# Patient Record
Sex: Female | Born: 1945
Health system: Southern US, Community
[De-identification: ages and names within clinical notes are randomized; demographics above are authoritative.]

## PROBLEM LIST (undated history)

## (undated) DIAGNOSIS — R609 Edema, unspecified: Secondary | ICD-10-CM

## (undated) DIAGNOSIS — M1990 Unspecified osteoarthritis, unspecified site: Secondary | ICD-10-CM

## (undated) DIAGNOSIS — E7800 Pure hypercholesterolemia, unspecified: Secondary | ICD-10-CM

## (undated) DIAGNOSIS — R202 Paresthesia of skin: Secondary | ICD-10-CM

## (undated) DIAGNOSIS — I10 Essential (primary) hypertension: Secondary | ICD-10-CM

## (undated) DIAGNOSIS — J449 Chronic obstructive pulmonary disease, unspecified: Secondary | ICD-10-CM

## (undated) DIAGNOSIS — M419 Scoliosis, unspecified: Secondary | ICD-10-CM

## (undated) DIAGNOSIS — D649 Anemia, unspecified: Secondary | ICD-10-CM

## (undated) DIAGNOSIS — G629 Polyneuropathy, unspecified: Secondary | ICD-10-CM

## (undated) DIAGNOSIS — T4145XA Adverse effect of unspecified anesthetic, initial encounter: Secondary | ICD-10-CM

## (undated) DIAGNOSIS — F419 Anxiety disorder, unspecified: Secondary | ICD-10-CM

## (undated) DIAGNOSIS — Z8719 Personal history of other diseases of the digestive system: Secondary | ICD-10-CM

## (undated) DIAGNOSIS — T8859XA Other complications of anesthesia, initial encounter: Secondary | ICD-10-CM

## (undated) DIAGNOSIS — F5104 Psychophysiologic insomnia: Secondary | ICD-10-CM

## (undated) DIAGNOSIS — R0602 Shortness of breath: Secondary | ICD-10-CM

## (undated) DIAGNOSIS — E539 Vitamin B deficiency, unspecified: Secondary | ICD-10-CM

## (undated) DIAGNOSIS — H9190 Unspecified hearing loss, unspecified ear: Secondary | ICD-10-CM

## (undated) DIAGNOSIS — F329 Major depressive disorder, single episode, unspecified: Secondary | ICD-10-CM

## (undated) DIAGNOSIS — R002 Palpitations: Secondary | ICD-10-CM

## (undated) DIAGNOSIS — K219 Gastro-esophageal reflux disease without esophagitis: Secondary | ICD-10-CM

## (undated) DIAGNOSIS — F32A Depression, unspecified: Secondary | ICD-10-CM

## (undated) DIAGNOSIS — R011 Cardiac murmur, unspecified: Secondary | ICD-10-CM

## (undated) DIAGNOSIS — N6310 Unspecified lump in the right breast, unspecified quadrant: Secondary | ICD-10-CM

## (undated) DIAGNOSIS — E210 Primary hyperparathyroidism: Secondary | ICD-10-CM

## (undated) DIAGNOSIS — Z8711 Personal history of peptic ulcer disease: Secondary | ICD-10-CM

## (undated) DIAGNOSIS — M810 Age-related osteoporosis without current pathological fracture: Secondary | ICD-10-CM

## (undated) DIAGNOSIS — I499 Cardiac arrhythmia, unspecified: Secondary | ICD-10-CM

## (undated) HISTORY — PX: VAGINAL HYSTERECTOMY: SUR661

## (undated) HISTORY — DX: Psychophysiologic insomnia: F51.04

## (undated) HISTORY — DX: Age-related osteoporosis without current pathological fracture: M81.0

## (undated) HISTORY — PX: DILATION AND CURETTAGE OF UTERUS: SHX78

## (undated) HISTORY — DX: Cardiac murmur, unspecified: R01.1

## (undated) HISTORY — PX: OTHER SURGICAL HISTORY: SHX169

## (undated) HISTORY — PX: JOINT REPLACEMENT: SHX530

## (undated) HISTORY — PX: BREAST SURGERY: SHX581

## (undated) HISTORY — DX: Unspecified osteoarthritis, unspecified site: M19.90

## (undated) HISTORY — PX: REFRACTIVE SURGERY: SHX103

## (undated) HISTORY — DX: Pure hypercholesterolemia, unspecified: E78.00

## (undated) HISTORY — DX: Chronic obstructive pulmonary disease, unspecified: J44.9

## (undated) HISTORY — PX: PITUITARY SURGERY: SHX203

## (undated) HISTORY — PX: EYE SURGERY: SHX253

---

## 1990-02-09 HISTORY — PX: BREAST BIOPSY: SHX20

## 1990-02-09 HISTORY — PX: BREAST EXCISIONAL BIOPSY: SUR124

## 2003-02-10 HISTORY — PX: LAPAROSCOPY: SHX197

## 2003-05-09 ENCOUNTER — Other Ambulatory Visit: Payer: Self-pay

## 2003-10-09 ENCOUNTER — Other Ambulatory Visit: Payer: Self-pay

## 2004-02-26 ENCOUNTER — Ambulatory Visit: Payer: Self-pay | Admitting: Internal Medicine

## 2004-08-19 ENCOUNTER — Ambulatory Visit: Payer: Self-pay | Admitting: Internal Medicine

## 2004-09-03 ENCOUNTER — Ambulatory Visit: Payer: Self-pay | Admitting: Internal Medicine

## 2004-09-29 ENCOUNTER — Ambulatory Visit: Payer: Self-pay | Admitting: Internal Medicine

## 2005-09-07 ENCOUNTER — Ambulatory Visit: Payer: Self-pay | Admitting: Obstetrics and Gynecology

## 2006-09-23 ENCOUNTER — Ambulatory Visit: Payer: Self-pay | Admitting: Obstetrics and Gynecology

## 2007-07-27 ENCOUNTER — Emergency Department: Payer: Self-pay | Admitting: Emergency Medicine

## 2007-10-26 ENCOUNTER — Ambulatory Visit: Payer: Self-pay | Admitting: Obstetrics and Gynecology

## 2008-10-05 ENCOUNTER — Ambulatory Visit: Payer: Self-pay | Admitting: Obstetrics and Gynecology

## 2008-10-12 ENCOUNTER — Ambulatory Visit: Payer: Self-pay | Admitting: Obstetrics and Gynecology

## 2009-02-18 ENCOUNTER — Ambulatory Visit: Payer: Self-pay | Admitting: Internal Medicine

## 2009-06-17 ENCOUNTER — Ambulatory Visit: Payer: Self-pay | Admitting: Otolaryngology

## 2009-06-26 ENCOUNTER — Ambulatory Visit: Payer: Self-pay | Admitting: Otolaryngology

## 2009-10-23 ENCOUNTER — Ambulatory Visit: Payer: Self-pay | Admitting: Otolaryngology

## 2009-11-07 ENCOUNTER — Encounter: Admission: RE | Admit: 2009-11-07 | Discharge: 2009-11-07 | Payer: Self-pay | Admitting: Neurological Surgery

## 2010-01-06 ENCOUNTER — Inpatient Hospital Stay (HOSPITAL_COMMUNITY): Admission: RE | Admit: 2010-01-06 | Discharge: 2010-01-09 | Payer: Self-pay | Admitting: Neurosurgery

## 2010-01-06 ENCOUNTER — Encounter (INDEPENDENT_AMBULATORY_CARE_PROVIDER_SITE_OTHER): Payer: Self-pay | Admitting: Neurosurgery

## 2010-04-18 ENCOUNTER — Ambulatory Visit: Payer: Self-pay | Admitting: Internal Medicine

## 2010-04-22 LAB — TYPE AND SCREEN
ABO/RH(D): O POS
Antibody Screen: NEGATIVE

## 2010-04-22 LAB — GLUCOSE, CAPILLARY
Glucose-Capillary: 109 mg/dL — ABNORMAL HIGH (ref 70–99)
Glucose-Capillary: 109 mg/dL — ABNORMAL HIGH (ref 70–99)
Glucose-Capillary: 118 mg/dL — ABNORMAL HIGH (ref 70–99)
Glucose-Capillary: 135 mg/dL — ABNORMAL HIGH (ref 70–99)
Glucose-Capillary: 142 mg/dL — ABNORMAL HIGH (ref 70–99)
Glucose-Capillary: 154 mg/dL — ABNORMAL HIGH (ref 70–99)
Glucose-Capillary: 179 mg/dL — ABNORMAL HIGH (ref 70–99)
Glucose-Capillary: 194 mg/dL — ABNORMAL HIGH (ref 70–99)

## 2010-04-22 LAB — COMPREHENSIVE METABOLIC PANEL
Albumin: 4 g/dL (ref 3.5–5.2)
Alkaline Phosphatase: 78 U/L (ref 39–117)
BUN: 16 mg/dL (ref 6–23)
Chloride: 108 mEq/L (ref 96–112)
Creatinine, Ser: 0.79 mg/dL (ref 0.4–1.2)
GFR calc non Af Amer: >60 mL/min (ref >60)
Glucose, Bld: 97 mg/dL (ref 70–99)
Potassium: 4.3 mEq/L (ref 3.5–5.1)
Total Bilirubin: 1.1 mg/dL (ref 0.3–1.2)

## 2010-04-22 LAB — CBC
HCT: 43.3 % (ref 36.0–46.0)
MCH: 31.9 pg (ref 26.0–34.0)
MCV: 93.9 fL (ref 78.0–100.0)
Platelets: 332 10*3/uL (ref 150–400)
RBC: 4.61 MIL/uL (ref 3.87–5.11)

## 2010-04-22 LAB — SODIUM
Sodium: 139 mEq/L (ref 135–145)
Sodium: 137 mEq/L (ref 135–145)
Sodium: 138 mEq/L (ref 135–145)
Sodium: 139 mEq/L (ref 135–145)
Sodium: 139 mEq/L (ref 135–145)

## 2010-04-22 LAB — OSMOLALITY
Osmolality: 285 mOsm/kg (ref 275–300)
Osmolality: 288 mOsm/kg (ref 275–300)
Osmolality: 290 mOsm/kg (ref 275–300)

## 2010-04-22 LAB — SURGICAL PCR SCREEN
MRSA, PCR: NEGATIVE
Staphylococcus aureus: NEGATIVE

## 2010-04-22 LAB — TISSUE CULTURE: Culture: NO GROWTH

## 2010-05-22 ENCOUNTER — Ambulatory Visit: Payer: Self-pay | Admitting: Neurosurgery

## 2010-10-22 ENCOUNTER — Other Ambulatory Visit: Payer: Self-pay | Admitting: Neurosurgery

## 2010-10-22 DIAGNOSIS — D352 Benign neoplasm of pituitary gland: Secondary | ICD-10-CM

## 2010-12-02 ENCOUNTER — Ambulatory Visit
Admission: RE | Admit: 2010-12-02 | Discharge: 2010-12-02 | Disposition: A | Discharge status: Home / Self Care | Payer: Federal, State, Local not specified - PPO | Source: Ambulatory Visit | Attending: Neurosurgery | Admitting: Neurosurgery

## 2010-12-02 DIAGNOSIS — D352 Benign neoplasm of pituitary gland: Secondary | ICD-10-CM

## 2010-12-02 MED ORDER — GADOBENATE DIMEGLUMINE 529 MG/ML IV SOLN
10.0000 mL | Freq: Once | INTRAVENOUS | Status: AC | PRN
  Administered 2010-12-02: 10 mL via INTRAVENOUS

## 2011-02-20 ENCOUNTER — Inpatient Hospital Stay: Payer: Self-pay | Admitting: General Practice

## 2011-02-20 LAB — URINALYSIS, COMPLETE
Bilirubin,UR: NEGATIVE
Glucose,UR: NEGATIVE mg/dL (ref 0–75)
Leukocyte Esterase: NEGATIVE
Nitrite: NEGATIVE
Ph: 7 (ref 4.5–8.0)
Protein: NEGATIVE
RBC,UR: NONE SEEN /HPF (ref 0–5)
Specific Gravity: 1.006 (ref 1.003–1.030)

## 2011-02-20 LAB — PROTIME-INR
INR: 0.9
Prothrombin Time: 13 secs (ref 11.5–14.7)

## 2011-02-20 LAB — BASIC METABOLIC PANEL
Anion Gap: 12 (ref 7–16)
BUN: 16 mg/dL (ref 7–18)
EGFR (African American): >60
EGFR (Non-African Amer.): >60
Glucose: 134 mg/dL — ABNORMAL HIGH (ref 65–99)
Potassium: 3.7 mmol/L (ref 3.5–5.1)
Sodium: 142 mmol/L (ref 136–145)

## 2011-02-20 LAB — CBC
HGB: 13.9 g/dL (ref 12.0–16.0)
MCHC: 34 g/dL (ref 32.0–36.0)
Platelet: 259 10*3/uL (ref 150–440)
RBC: 4.24 10*6/uL (ref 3.80–5.20)
WBC: 13.4 10*3/uL — ABNORMAL HIGH (ref 3.6–11.0)

## 2011-02-21 LAB — PLATELET COUNT: Platelet: 210 10*3/uL (ref 150–440)

## 2011-02-21 LAB — HEMOGLOBIN: HGB: 10.9 g/dL — ABNORMAL LOW (ref 12.0–16.0)

## 2011-02-21 LAB — BASIC METABOLIC PANEL
Anion Gap: 10 (ref 7–16)
BUN: 8 mg/dL (ref 7–18)
Calcium, Total: 8.5 mg/dL (ref 8.5–10.1)
Co2: 23 mmol/L (ref 21–32)
Creatinine: 0.66 mg/dL (ref 0.60–1.30)
Osmolality: 284 (ref 275–301)

## 2011-02-22 LAB — MAGNESIUM: Magnesium: 1.9 mg/dL

## 2011-02-22 LAB — BASIC METABOLIC PANEL
Anion Gap: 9 (ref 7–16)
Calcium, Total: 8.2 mg/dL — ABNORMAL LOW (ref 8.5–10.1)
Co2: 25 mmol/L (ref 21–32)
EGFR (African American): >60
EGFR (Non-African Amer.): >60
Glucose: 101 mg/dL — ABNORMAL HIGH (ref 65–99)
Osmolality: 290 (ref 275–301)
Potassium: 3.2 mmol/L — ABNORMAL LOW (ref 3.5–5.1)
Sodium: 146 mmol/L — ABNORMAL HIGH (ref 136–145)

## 2011-02-22 LAB — HEMOGLOBIN: HGB: 9.8 g/dL — ABNORMAL LOW (ref 12.0–16.0)

## 2011-02-22 LAB — PLATELET COUNT: Platelet: 179 10*3/uL (ref 150–440)

## 2011-02-23 LAB — BASIC METABOLIC PANEL
BUN: 6 mg/dL — ABNORMAL LOW (ref 7–18)
Calcium, Total: 8.3 mg/dL — ABNORMAL LOW (ref 8.5–10.1)
Chloride: 111 mmol/L — ABNORMAL HIGH (ref 98–107)
Co2: 23 mmol/L (ref 21–32)
Osmolality: 287 (ref 275–301)
Potassium: 4 mmol/L (ref 3.5–5.1)

## 2011-02-23 LAB — CBC WITH DIFFERENTIAL/PLATELET
Basophil %: 0.6 %
Eosinophil %: 3.7 %
HGB: 9.9 g/dL — ABNORMAL LOW (ref 12.0–16.0)
Lymphocyte %: 16.6 %
MCH: 31.8 pg (ref 26.0–34.0)
Monocyte #: 1.4 10*3/uL — ABNORMAL HIGH (ref 0.0–0.7)
Monocyte %: 14 %
Neutrophil %: 65.1 %
RBC: 3.1 10*6/uL — ABNORMAL LOW (ref 3.80–5.20)
WBC: 9.8 10*3/uL (ref 3.6–11.0)

## 2011-02-24 LAB — BASIC METABOLIC PANEL
Anion Gap: 12 (ref 7–16)
BUN: 8 mg/dL (ref 7–18)
Chloride: 110 mmol/L — ABNORMAL HIGH (ref 98–107)
Co2: 24 mmol/L (ref 21–32)
Creatinine: 0.6 mg/dL (ref 0.60–1.30)
EGFR (African American): >60
EGFR (Non-African Amer.): >60
Sodium: 146 mmol/L — ABNORMAL HIGH (ref 136–145)

## 2011-04-15 ENCOUNTER — Other Ambulatory Visit: Payer: Self-pay | Admitting: Neurosurgery

## 2011-04-15 DIAGNOSIS — D497 Neoplasm of unspecified behavior of endocrine glands and other parts of nervous system: Secondary | ICD-10-CM

## 2011-04-20 ENCOUNTER — Ambulatory Visit
Admission: RE | Admit: 2011-04-20 | Discharge: 2011-04-20 | Disposition: A | Discharge status: Home / Self Care | Payer: Federal, State, Local not specified - PPO | Source: Ambulatory Visit | Attending: Neurosurgery | Admitting: Neurosurgery

## 2011-04-20 DIAGNOSIS — D497 Neoplasm of unspecified behavior of endocrine glands and other parts of nervous system: Secondary | ICD-10-CM

## 2011-04-20 MED ORDER — GADOBENATE DIMEGLUMINE 529 MG/ML IV SOLN
10.0000 mL | Freq: Once | INTRAVENOUS | Status: AC | PRN
  Administered 2011-04-20: 10 mL via INTRAVENOUS

## 2011-05-26 DIAGNOSIS — J449 Chronic obstructive pulmonary disease, unspecified: Secondary | ICD-10-CM | POA: Insufficient documentation

## 2011-06-16 ENCOUNTER — Other Ambulatory Visit: Payer: Self-pay | Admitting: Neurosurgery

## 2011-06-16 DIAGNOSIS — M542 Cervicalgia: Secondary | ICD-10-CM

## 2011-06-23 ENCOUNTER — Ambulatory Visit
Admission: RE | Admit: 2011-06-23 | Discharge: 2011-06-23 | Disposition: A | Discharge status: Home / Self Care | Payer: Federal, State, Local not specified - PPO | Source: Ambulatory Visit | Attending: Neurosurgery | Admitting: Neurosurgery

## 2011-06-23 DIAGNOSIS — M542 Cervicalgia: Secondary | ICD-10-CM

## 2011-06-23 MED ORDER — GADOBENATE DIMEGLUMINE 529 MG/ML IV SOLN
15.0000 mL | Freq: Once | INTRAVENOUS | Status: AC | PRN
  Administered 2011-06-23: 15 mL via INTRAVENOUS

## 2011-07-28 ENCOUNTER — Ambulatory Visit: Payer: Self-pay | Admitting: Family Medicine

## 2011-10-14 ENCOUNTER — Other Ambulatory Visit: Payer: Self-pay | Admitting: Neurosurgery

## 2011-10-16 ENCOUNTER — Encounter (HOSPITAL_COMMUNITY): Payer: Self-pay | Admitting: Pharmacy Technician

## 2011-10-26 ENCOUNTER — Encounter (HOSPITAL_COMMUNITY)
Admission: RE | Admit: 2011-10-26 | Discharge: 2011-10-26 | Disposition: A | Discharge status: Home / Self Care | Payer: Federal, State, Local not specified - PPO | Source: Ambulatory Visit | Attending: Neurosurgery | Admitting: Neurosurgery

## 2011-10-26 ENCOUNTER — Encounter (HOSPITAL_COMMUNITY): Payer: Self-pay

## 2011-10-26 ENCOUNTER — Ambulatory Visit (HOSPITAL_COMMUNITY)
Admission: RE | Admit: 2011-10-26 | Discharge: 2011-10-26 | Disposition: A | Discharge status: Home / Self Care | Payer: Federal, State, Local not specified - PPO | Source: Ambulatory Visit | Attending: Neurosurgery | Admitting: Neurosurgery

## 2011-10-26 DIAGNOSIS — Z01812 Encounter for preprocedural laboratory examination: Secondary | ICD-10-CM | POA: Insufficient documentation

## 2011-10-26 DIAGNOSIS — Z01810 Encounter for preprocedural cardiovascular examination: Secondary | ICD-10-CM | POA: Insufficient documentation

## 2011-10-26 DIAGNOSIS — Z01818 Encounter for other preprocedural examination: Secondary | ICD-10-CM | POA: Insufficient documentation

## 2011-10-26 HISTORY — DX: Gastro-esophageal reflux disease without esophagitis: K21.9

## 2011-10-26 HISTORY — DX: Major depressive disorder, single episode, unspecified: F32.9

## 2011-10-26 HISTORY — DX: Depression, unspecified: F32.A

## 2011-10-26 HISTORY — DX: Essential (primary) hypertension: I10

## 2011-10-26 HISTORY — DX: Anxiety disorder, unspecified: F41.9

## 2011-10-26 HISTORY — DX: Cardiac arrhythmia, unspecified: I49.9

## 2011-10-26 LAB — CBC
HCT: 39 % (ref 36.0–46.0)
Hemoglobin: 13.1 g/dL (ref 12.0–15.0)
MCV: 91.1 fL (ref 78.0–100.0)
RBC: 4.28 MIL/uL (ref 3.87–5.11)
RDW: 14.1 % (ref 11.5–15.5)
WBC: 11.1 10*3/uL — ABNORMAL HIGH (ref 4.0–10.5)

## 2011-10-26 LAB — BASIC METABOLIC PANEL
CO2: 26 mEq/L (ref 19–32)
Chloride: 106 mEq/L (ref 96–112)
Creatinine, Ser: 0.83 mg/dL (ref 0.50–1.10)
GFR calc Af Amer: 85 mL/min — ABNORMAL LOW (ref >90)
Potassium: 3.9 mEq/L (ref 3.5–5.1)
Sodium: 140 mEq/L (ref 135–145)

## 2011-10-26 LAB — SURGICAL PCR SCREEN: Staphylococcus aureus: POSITIVE — AB

## 2011-10-26 NOTE — Pre-Procedure Instructions (Signed)
20 Shamela Haydon Page  10/26/2011   Your procedure is scheduled on:   10/30/11  Friday    Report to [facility] Short Stay Center at 1230 PM.  Call this number if you have problems the morning of surgery: [phone]   Remember:   Do not eat food OR DRINK :After Midnight.   Take these medicines the morning of surgery with A SIP OF WATER:  NORVASC(AMLODIPINE), BUSPAR,  LOPRESSOR(METOPROLOL), PROTONIX, ZOLOFT, TOPAMAX, ULTRAM IF NEEDED   Do not wear jewelry, make-up or nail polish.  Do not wear lotions, powders, or perfumes. You may wear deodorant.  Do not shave 48 hours prior to surgery. Men may shave face and neck.  Do not bring valuables to the hospital.  Contacts, dentures or bridgework may not be worn into surgery.  Leave suitcase in the car. After surgery it may be brought to your room.  For patients admitted to the hospital, checkout time is 11:00 AM the day of discharge.   Patients discharged the day of surgery will not be allowed to drive home.  Name and phone number of your driver:   Special Instructions: CHG Shower Use Special Wash: 1/2 bottle night before surgery and 1/2 bottle morning of surgery.   Please read over the following fact sheets that you were given: Pain Booklet, Coughing and Deep Breathing, MRSA Information and Surgical Site Infection Prevention

## 2011-10-26 NOTE — Progress Notes (Signed)
REQUESTED LAST OFFICE NOTE, EKG, STRESS TEST FROM [facility] IN [place].   PATIENT DOES NOT HAVE CARDIOLOGIST.

## 2011-10-27 MED ORDER — CEFAZOLIN SODIUM-DEXTROSE 2-3 GM-% IV SOLR
2.0000 g | INTRAVENOUS | Status: AC
  Administered 2011-10-28: 2 g via INTRAVENOUS

## 2011-10-27 NOTE — Consult Note (Addendum)
Anesthesia chart review: Patient is a 66 year old female scheduled for C4-5, C5-6 ACDF on 10/30/11 by Dr. Wynetta Emery.  History includes former smoker, HTN, anxiety, depression, palpitations, GERD, "partial" right hip replacement '13, prior pituitary macroadenoma s/p resection '11.  PCP is Dr. Judithann Sheen.  EKG on 10/26/11 showed SB with occasional PACs.  Her PAT documented that patient had a prior stress test done at [facility].  Records requested, but are still pending.  There is no active cardiopulmonary disease on chest x-ray from 10/26/2011.  Labs noted.    I'll follow-up additional records from [facility] once available.  Shonna Chock, PA-C 10/27/11 1822  Addendum: 10/27/11 1850 I was just notified that this case was moved to 10/28/11.  As above, previous stress test is still pending.  There is no documentation of when this test was done.  Patient has no cardiac history other than HTN and palpitations.  Short Stay staff will have to follow-up on cardiac testing results tomorrow morning (if done within the past three years).

## 2011-10-27 NOTE — Progress Notes (Signed)
Pt called.  Message left w/ husband, Fredrik Cove, that time had been changed to 8:00AM.  He verbalized understanding.

## 2011-10-28 ENCOUNTER — Encounter (HOSPITAL_COMMUNITY)
Admission: RE | Disposition: A | Discharge status: Home / Self Care | Payer: Self-pay | Source: Ambulatory Visit | Attending: Neurosurgery

## 2011-10-28 ENCOUNTER — Ambulatory Visit (HOSPITAL_COMMUNITY): Payer: Federal, State, Local not specified - PPO

## 2011-10-28 ENCOUNTER — Encounter (HOSPITAL_COMMUNITY): Payer: Self-pay | Admitting: *Deleted

## 2011-10-28 ENCOUNTER — Encounter (HOSPITAL_COMMUNITY): Payer: Self-pay | Admitting: Vascular Surgery

## 2011-10-28 ENCOUNTER — Ambulatory Visit (HOSPITAL_COMMUNITY): Payer: Federal, State, Local not specified - PPO | Admitting: Vascular Surgery

## 2011-10-28 ENCOUNTER — Ambulatory Visit (HOSPITAL_COMMUNITY)
Admission: RE | Admit: 2011-10-28 | Discharge: 2011-10-29 | Disposition: A | Discharge status: Home / Self Care | Payer: Federal, State, Local not specified - PPO | Source: Ambulatory Visit | Attending: Neurosurgery | Admitting: Neurosurgery

## 2011-10-28 DIAGNOSIS — M47812 Spondylosis without myelopathy or radiculopathy, cervical region: Secondary | ICD-10-CM | POA: Insufficient documentation

## 2011-10-28 DIAGNOSIS — I10 Essential (primary) hypertension: Secondary | ICD-10-CM | POA: Insufficient documentation

## 2011-10-28 DIAGNOSIS — K219 Gastro-esophageal reflux disease without esophagitis: Secondary | ICD-10-CM | POA: Insufficient documentation

## 2011-10-28 DIAGNOSIS — F3289 Other specified depressive episodes: Secondary | ICD-10-CM | POA: Insufficient documentation

## 2011-10-28 DIAGNOSIS — F329 Major depressive disorder, single episode, unspecified: Secondary | ICD-10-CM | POA: Insufficient documentation

## 2011-10-28 DIAGNOSIS — F411 Generalized anxiety disorder: Secondary | ICD-10-CM | POA: Insufficient documentation

## 2011-10-28 HISTORY — PX: ANTERIOR CERVICAL DECOMP/DISCECTOMY FUSION: SHX1161

## 2011-10-28 LAB — GLUCOSE, CAPILLARY: Glucose-Capillary: 178 mg/dL — ABNORMAL HIGH (ref 70–99)

## 2011-10-28 SURGERY — ANTERIOR CERVICAL DECOMPRESSION/DISCECTOMY FUSION 2 LEVELS
Anesthesia: General | Site: Spine Cervical | Wound class: Clean

## 2011-10-28 MED ORDER — IRBESARTAN 75 MG PO TABS
75.0000 mg | ORAL_TABLET | Freq: Every day | ORAL | Status: DC
  Administered 2011-10-29: 75 mg via ORAL
  Filled 2011-10-28 (×2): qty 1

## 2011-10-28 MED ORDER — ADULT MULTIVITAMIN W/MINERALS CH
1.0000 | ORAL_TABLET | Freq: Every day | ORAL | Status: DC
  Administered 2011-10-28 – 2011-10-29 (×2): 1 via ORAL
  Filled 2011-10-28 (×2): qty 1

## 2011-10-28 MED ORDER — ASPIRIN EC 81 MG PO TBEC
81.0000 mg | DELAYED_RELEASE_TABLET | Freq: Every day | ORAL | Status: DC
Start: 2011-10-28 — End: 2011-10-29
  Administered 2011-10-28 – 2011-10-29 (×2): 81 mg via ORAL
  Filled 2011-10-28 (×2): qty 1

## 2011-10-28 MED ORDER — ACETAMINOPHEN 650 MG RE SUPP: 650.0000 mg | RECTAL | Status: DC | PRN

## 2011-10-28 MED ORDER — BUSPIRONE HCL 15 MG PO TABS
7.5000 mg | ORAL_TABLET | Freq: Every day | ORAL | Status: DC
  Administered 2011-10-29: 7.5 mg via ORAL
  Filled 2011-10-28 (×2): qty 1

## 2011-10-28 MED ORDER — MENTHOL 3 MG MT LOZG: 1.0000 | LOZENGE | OROMUCOSAL | Status: DC | PRN

## 2011-10-28 MED ORDER — BACITRACIN 50000 UNITS IM SOLR
INTRAMUSCULAR | Status: AC
  Filled 2011-10-28: qty 1

## 2011-10-28 MED ORDER — CEFAZOLIN SODIUM 1-5 GM-% IV SOLN
1.0000 g | Freq: Three times a day (TID) | INTRAVENOUS | Status: AC
  Administered 2011-10-28 (×2): 1 g via INTRAVENOUS
  Filled 2011-10-28 (×2): qty 50

## 2011-10-28 MED ORDER — SODIUM CHLORIDE 0.9 % IJ SOLN: 3.0000 mL | INTRAMUSCULAR | Status: DC | PRN

## 2011-10-28 MED ORDER — SODIUM CHLORIDE 0.9 % IJ SOLN
3.0000 mL | Freq: Two times a day (BID) | INTRAMUSCULAR | Status: DC
  Administered 2011-10-28 (×2): 3 mL via INTRAVENOUS

## 2011-10-28 MED ORDER — SODIUM CHLORIDE 0.9 % IV SOLN
INTRAVENOUS | Status: AC
  Filled 2011-10-28: qty 500

## 2011-10-28 MED ORDER — DEXAMETHASONE SODIUM PHOSPHATE 4 MG/ML IJ SOLN
INTRAMUSCULAR | Status: DC | PRN
  Administered 2011-10-28: 10 mg via INTRAVENOUS

## 2011-10-28 MED ORDER — DEXAMETHASONE SODIUM PHOSPHATE 10 MG/ML IJ SOLN: 10.0000 mg | INTRAMUSCULAR | Status: DC

## 2011-10-28 MED ORDER — PHENOL 1.4 % MT LIQD: 1.0000 | OROMUCOSAL | Status: DC | PRN

## 2011-10-28 MED ORDER — MIDAZOLAM HCL 5 MG/5ML IJ SOLN
INTRAMUSCULAR | Status: DC | PRN
  Administered 2011-10-28: 1 mg via INTRAVENOUS

## 2011-10-28 MED ORDER — NEOSTIGMINE METHYLSULFATE 1 MG/ML IJ SOLN
INTRAMUSCULAR | Status: DC | PRN
  Administered 2011-10-28: 4 mg via INTRAVENOUS

## 2011-10-28 MED ORDER — TOPIRAMATE 25 MG PO TABS
50.0000 mg | ORAL_TABLET | Freq: Every day | ORAL | Status: DC
  Administered 2011-10-28: 50 mg via ORAL
  Filled 2011-10-28 (×2): qty 2

## 2011-10-28 MED ORDER — ATORVASTATIN CALCIUM 40 MG PO TABS
40.0000 mg | ORAL_TABLET | Freq: Every day | ORAL | Status: DC
  Administered 2011-10-28 – 2011-10-29 (×2): 40 mg via ORAL
  Filled 2011-10-28 (×2): qty 1

## 2011-10-28 MED ORDER — ZOLEDRONIC ACID 4 MG/5ML IV CONC: 4.0000 mg | Freq: Once | INTRAVENOUS | Status: DC

## 2011-10-28 MED ORDER — 0.9 % SODIUM CHLORIDE (POUR BTL) OPTIME
TOPICAL | Status: DC | PRN
  Administered 2011-10-28: 1000 mL

## 2011-10-28 MED ORDER — ONDANSETRON HCL 4 MG/2ML IJ SOLN
INTRAMUSCULAR | Status: DC | PRN
  Administered 2011-10-28: 4 mg via INTRAVENOUS

## 2011-10-28 MED ORDER — AMLODIPINE BESYLATE 5 MG PO TABS
5.0000 mg | ORAL_TABLET | Freq: Every day | ORAL | Status: DC
  Administered 2011-10-29: 5 mg via ORAL
  Filled 2011-10-28 (×2): qty 1

## 2011-10-28 MED ORDER — ROCURONIUM BROMIDE 100 MG/10ML IV SOLN
INTRAVENOUS | Status: DC | PRN
  Administered 2011-10-28: 50 mg via INTRAVENOUS

## 2011-10-28 MED ORDER — LACTATED RINGERS IV SOLN
INTRAVENOUS | Status: DC | PRN
  Administered 2011-10-28 (×2): via INTRAVENOUS

## 2011-10-28 MED ORDER — GLYCOPYRROLATE 0.2 MG/ML IJ SOLN
INTRAMUSCULAR | Status: DC | PRN
  Administered 2011-10-28: .6 mg via INTRAVENOUS

## 2011-10-28 MED ORDER — HYDROMORPHONE HCL PF 1 MG/ML IJ SOLN: 0.5000 mg | INTRAMUSCULAR | Status: DC | PRN

## 2011-10-28 MED ORDER — FENTANYL CITRATE 0.05 MG/ML IJ SOLN
INTRAMUSCULAR | Status: DC | PRN
  Administered 2011-10-28 (×2): 50 ug via INTRAVENOUS
  Administered 2011-10-28: 25 ug via INTRAVENOUS
  Administered 2011-10-28: 100 ug via INTRAVENOUS
  Administered 2011-10-28: 25 ug via INTRAVENOUS

## 2011-10-28 MED ORDER — TRAMADOL HCL 50 MG PO TABS
50.0000 mg | ORAL_TABLET | Freq: Four times a day (QID) | ORAL | Status: DC | PRN
  Filled 2011-10-28: qty 1

## 2011-10-28 MED ORDER — HEMOSTATIC AGENTS (NO CHARGE) OPTIME
TOPICAL | Status: DC | PRN
  Administered 2011-10-28 (×2): 1 via TOPICAL

## 2011-10-28 MED ORDER — ACETAMINOPHEN 325 MG PO TABS: 650.0000 mg | ORAL_TABLET | ORAL | Status: DC | PRN

## 2011-10-28 MED ORDER — ALUM & MAG HYDROXIDE-SIMETH 200-200-20 MG/5ML PO SUSP: 30.0000 mL | Freq: Four times a day (QID) | ORAL | Status: DC | PRN

## 2011-10-28 MED ORDER — THROMBIN 5000 UNITS EX SOLR
CUTANEOUS | Status: DC | PRN
  Administered 2011-10-28 (×5): 5000 [IU] via TOPICAL

## 2011-10-28 MED ORDER — DEXAMETHASONE SODIUM PHOSPHATE 10 MG/ML IJ SOLN
INTRAMUSCULAR | Status: AC
  Filled 2011-10-28: qty 1

## 2011-10-28 MED ORDER — SERTRALINE HCL 100 MG PO TABS
100.0000 mg | ORAL_TABLET | Freq: Every day | ORAL | Status: DC
  Administered 2011-10-29: 100 mg via ORAL
  Filled 2011-10-28 (×2): qty 1

## 2011-10-28 MED ORDER — PROPOFOL 10 MG/ML IV BOLUS
INTRAVENOUS | Status: DC | PRN
  Administered 2011-10-28: 130 mg via INTRAVENOUS

## 2011-10-28 MED ORDER — DOCUSATE SODIUM 100 MG PO CAPS
100.0000 mg | ORAL_CAPSULE | Freq: Every day | ORAL | Status: DC
  Administered 2011-10-28: 100 mg via ORAL
  Filled 2011-10-28: qty 1

## 2011-10-28 MED ORDER — THROMBIN 5000 UNITS EX SOLR
OROMUCOSAL | Status: DC | PRN
  Administered 2011-10-28: 11:00:00 via TOPICAL

## 2011-10-28 MED ORDER — MUPIROCIN 2 % EX OINT
TOPICAL_OINTMENT | Freq: Two times a day (BID) | CUTANEOUS | Status: DC
  Administered 2011-10-29: 10:00:00 via NASAL
  Filled 2011-10-28: qty 22

## 2011-10-28 MED ORDER — LIDOCAINE HCL (CARDIAC) 20 MG/ML IV SOLN
INTRAVENOUS | Status: DC | PRN
  Administered 2011-10-28: 70 mg via INTRAVENOUS

## 2011-10-28 MED ORDER — SODIUM CHLORIDE 0.9 % IR SOLN
Status: DC | PRN
  Administered 2011-10-28: 11:00:00

## 2011-10-28 MED ORDER — CEFAZOLIN SODIUM-DEXTROSE 2-3 GM-% IV SOLR
INTRAVENOUS | Status: AC
  Filled 2011-10-28: qty 50

## 2011-10-28 MED ORDER — ONDANSETRON HCL 4 MG/2ML IJ SOLN: 4.0000 mg | INTRAMUSCULAR | Status: DC | PRN

## 2011-10-28 MED ORDER — PANTOPRAZOLE SODIUM 40 MG PO TBEC
40.0000 mg | DELAYED_RELEASE_TABLET | Freq: Every day | ORAL | Status: DC
  Administered 2011-10-29: 40 mg via ORAL
  Filled 2011-10-28: qty 1

## 2011-10-28 MED ORDER — OXYCODONE-ACETAMINOPHEN 5-325 MG PO TABS
1.0000 | ORAL_TABLET | ORAL | Status: DC | PRN
  Administered 2011-10-28 – 2011-10-29 (×2): 1 via ORAL
  Filled 2011-10-28 (×2): qty 1

## 2011-10-28 MED ORDER — HYDROMORPHONE HCL PF 1 MG/ML IJ SOLN: 0.2500 mg | INTRAMUSCULAR | Status: DC | PRN

## 2011-10-28 MED ORDER — CYCLOBENZAPRINE HCL 10 MG PO TABS
10.0000 mg | ORAL_TABLET | Freq: Three times a day (TID) | ORAL | Status: DC | PRN
  Administered 2011-10-28 – 2011-10-29 (×2): 10 mg via ORAL
  Filled 2011-10-28 (×2): qty 1

## 2011-10-28 MED ORDER — METOPROLOL TARTRATE 50 MG PO TABS
50.0000 mg | ORAL_TABLET | Freq: Two times a day (BID) | ORAL | Status: DC
  Administered 2011-10-28 – 2011-10-29 (×2): 50 mg via ORAL
  Filled 2011-10-28 (×4): qty 1

## 2011-10-28 SURGICAL SUPPLY — 61 items
BAG DECANTER FOR FLEXI CONT (MISCELLANEOUS) ×2 IMPLANT
BENZOIN TINCTURE PRP APPL 2/3 (GAUZE/BANDAGES/DRESSINGS) ×2 IMPLANT
BIT DRILL SPINE QC 12 (BIT) ×2 IMPLANT
BRUSH SCRUB EZ PLAIN DRY (MISCELLANEOUS) ×2 IMPLANT
BUR MATCHSTICK NEURO 3.0 LAGG (BURR) ×2 IMPLANT
CANISTER SUCTION 2500CC (MISCELLANEOUS) ×2 IMPLANT
CLOTH BEACON ORANGE TIMEOUT ST (SAFETY) ×2 IMPLANT
CONT SPEC 4OZ CLIKSEAL STRL BL (MISCELLANEOUS) ×2 IMPLANT
DERMABOND ADVANCED (GAUZE/BANDAGES/DRESSINGS) ×1
DERMABOND ADVANCED .7 DNX12 (GAUZE/BANDAGES/DRESSINGS) ×1 IMPLANT
DRAPE C-ARM 42X72 X-RAY (DRAPES) ×4 IMPLANT
DRAPE LAPAROTOMY 100X72 PEDS (DRAPES) ×2 IMPLANT
DRAPE MICROSCOPE ZEISS OPMI (DRAPES) ×2 IMPLANT
DRAPE POUCH INSTRU U-SHP 10X18 (DRAPES) ×2 IMPLANT
DRSG OPSITE 4X5.5 SM (GAUZE/BANDAGES/DRESSINGS) ×2 IMPLANT
ELECT COATED BLADE 2.86 ST (ELECTRODE) ×2 IMPLANT
ELECT REM PT RETURN 9FT ADLT (ELECTROSURGICAL) ×2
ELECTRODE REM PT RTRN 9FT ADLT (ELECTROSURGICAL) ×1 IMPLANT
GAUZE SPONGE 4X4 16PLY XRAY LF (GAUZE/BANDAGES/DRESSINGS) IMPLANT
GLOVE BIO SURGEON STRL SZ8 (GLOVE) ×6 IMPLANT
GLOVE BIOGEL PI IND STRL 7.0 (GLOVE) ×1 IMPLANT
GLOVE BIOGEL PI IND STRL 8 (GLOVE) ×1 IMPLANT
GLOVE BIOGEL PI INDICATOR 7.0 (GLOVE) ×1
GLOVE BIOGEL PI INDICATOR 8 (GLOVE) ×1
GLOVE EXAM NITRILE LRG STRL (GLOVE) IMPLANT
GLOVE EXAM NITRILE MD LF STRL (GLOVE) IMPLANT
GLOVE EXAM NITRILE XL STR (GLOVE) IMPLANT
GLOVE EXAM NITRILE XS STR PU (GLOVE) IMPLANT
GLOVE INDICATOR 8.5 STRL (GLOVE) ×4 IMPLANT
GLOVE SS BIOGEL STRL SZ 6.5 (GLOVE) ×2 IMPLANT
GLOVE SUPERSENSE BIOGEL SZ 6.5 (GLOVE) ×2
GLOVE SURG SS PI 8.0 STRL IVOR (GLOVE) ×2 IMPLANT
GOWN BRE IMP SLV AUR LG STRL (GOWN DISPOSABLE) IMPLANT
GOWN BRE IMP SLV AUR XL STRL (GOWN DISPOSABLE) ×4 IMPLANT
GOWN STRL REIN 2XL LVL4 (GOWN DISPOSABLE) IMPLANT
HEAD HALTER (SOFTGOODS) ×2 IMPLANT
HEMOSTAT POWDER KIT SURGIFOAM (HEMOSTASIS) ×2 IMPLANT
KIT BASIN OR (CUSTOM PROCEDURE TRAY) ×2 IMPLANT
KIT ROOM TURNOVER OR (KITS) ×2 IMPLANT
NEEDLE SPNL 20GX3.5 QUINCKE YW (NEEDLE) ×2 IMPLANT
NS IRRIG 1000ML POUR BTL (IV SOLUTION) ×2 IMPLANT
PACK LAMINECTOMY NEURO (CUSTOM PROCEDURE TRAY) ×2 IMPLANT
PAD ARMBOARD 7.5X6 YLW CONV (MISCELLANEOUS) ×4 IMPLANT
PLATE ANT CERV XTEND 2 LV 28 (Plate) ×2 IMPLANT
PUTTY BONE DBX 2.5 MIS (Bone Implant) ×2 IMPLANT
RUBBERBAND STERILE (MISCELLANEOUS) ×4 IMPLANT
SCREW XTD VAR 4.2 SELF TAP 12 (Screw) ×12 IMPLANT
SPACER COLONIAL 6X14X12 (Spacer) ×2 IMPLANT
SPACER COLONIAL 7X14X12 (Spacer) ×2 IMPLANT
SPONGE GAUZE 4X4 12PLY (GAUZE/BANDAGES/DRESSINGS) ×2 IMPLANT
SPONGE INTESTINAL PEANUT (DISPOSABLE) ×2 IMPLANT
SPONGE SURGIFOAM ABS GEL SZ50 (HEMOSTASIS) ×2 IMPLANT
STRIP CLOSURE SKIN 1/2X4 (GAUZE/BANDAGES/DRESSINGS) ×2 IMPLANT
SUT VIC AB 3-0 SH 8-18 (SUTURE) ×2 IMPLANT
SUT VICRYL 4-0 PS2 18IN ABS (SUTURE) ×2 IMPLANT
SYR 20ML ECCENTRIC (SYRINGE) ×2 IMPLANT
TAPE CLOTH 4X10 WHT NS (GAUZE/BANDAGES/DRESSINGS) IMPLANT
TOWEL OR 17X24 6PK STRL BLUE (TOWEL DISPOSABLE) ×2 IMPLANT
TOWEL OR 17X26 10 PK STRL BLUE (TOWEL DISPOSABLE) ×2 IMPLANT
TRAP SPECIMEN MUCOUS 40CC (MISCELLANEOUS) ×2 IMPLANT
WATER STERILE IRR 1000ML POUR (IV SOLUTION) ×2 IMPLANT

## 2011-10-28 NOTE — Progress Notes (Signed)
Orthopedic Tech Progress Note Patient Details:  [name] 12-Jul-1945 [id number] Neuro PACU nurse called for soft cervical collar. Collar delivered and placed on patient. Nurse informed that order still needed to be placed for collar and agreed to to place order. Transport team already there to move patient to room.  Ortho Devices Type of Ortho Device: Soft collar Ortho Device/Splint Interventions: Application   Asia R Thompson 10/28/2011, 2:00 PM
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
## 2011-10-28 NOTE — Progress Notes (Signed)
Met with patient this afternoon after receiving a call from nursing services who stated that patient is alert and awake from surgery and was asking to see CSW as was her cousin.  Patient did not appear groggy or confused during the visit.  [name]. [name]  [phone]

## 2011-10-28 NOTE — Op Note (Signed)
Preoperative diagnosis: Cervical spondylitic radiculopathy from severe cervical stenosis at C4-5 and C5-6  Postoperative diagnosis: Same    procedure: Anterior cervical discectomies and fusion C4-5 C5-6 using globus peek cages packed with local autograft mixed DBX and globus extend plating system using 613 mm variable-angle screws surgeon: Donalee Citrin  Assistant: Marikay Alar  Anesthesia: Gen.  EBL: Minimal  Deep history of present illness is a progress worsening neck and right greater left arm pain shoulder pain with numbness tingling rate down to her right arm into the thumb and forefinger consistent with both the C5 and C6 nerve root pattern. Reoperative Lahey imaging findings showed kyphotic deformity and severe cervical stenosis center at C4-5 and C5-6 nerve roots respectively. Failed all forms of conservative treatment and due to her progression clinical syndrome and imaging findings and clinical exam patient recommended anterior cervical discectomy and fusion. I extensively extensively reviewed the risks and benefits of the operation as well as perioperative course and expectations of outcome alternatives surgery and she understood and agreed to proceed forward.  Operative procedure: Patient brought into the or was induced under general anesthesia positioned supine the neck in slight extension in 5 pounds of halter traction. The right 7 next prepped and draped in routine sterile fashion preoperative localizing proper level so a curvilinear incision was made just up midline to a degree of the sternomastoid and the superficial layer of the platysmas dissected and divided longitudinally the avascular tissue sternomastoid and strap as was done to the fascia precautions doesn't Kitners. Interoperative X. identify the appropriate level so annulotomy is were made with the scalpel marked the displaced lungs close affected laterally and large anterior aspect of did not flexor rongeur and 20 minute Kerrison  punch. The C5-6 disc space is a markedly degenerated and collapsed after the disc spaces identified and this was drilled down capturing the bone shavings in a mucous trap then under microscopic illumination further drilling down and under biting of the endplates allowed defecation of the PLL. This was then removed in piecemeal fashion exposing the thecal sac there was a large posterior spur coming off the C5 vertebral body this was aggressively and bitten to decompress the central canal March across laterally both C6 pedicles were identified and both C6 nerve roots decompressed flush with pedicle. After adequate decompression achieved both centrally and foraminally this level was packed with Gelfoam to second C4-5 and a similar fashion C4-5 was drilled down capturing the bone shavings in a Aggressive abutting both endplates was carried out removing a large spur coming the posterior aspect the C4 vertebral body this decompressed since canal both C5 nerve roots are palpated both C5 pedicles were identified and both C5 nerve roots were skeletonized flush with pedicle. Uncovertebral joint was disarticulated on the right at C5 to ensure adequate decompression of the C5 nerve root. Then 6 cm graft was inserted C5-6,  7 mm graft was inserted C4-5 all cages were packed with bone graft and mixed DBX and after the plate adequate placement implants the plate was placed all screws excellent purchase locking mechanism was gauged wound scope was irrigated meticulous he states was maintained was closed in layers with after Vicryl and platysma running 4 subcuticular and skin benzoin Steri-Strips were applied patient recovered in stable condition at the end of case on it counts sponge counts were correct.

## 2011-10-28 NOTE — H&P (Signed)
[name] is an 66 y.o. female.   Chief Complaint: Neck and right shoulder and arm pain HPI: Patient is a 66 year old female is a progress worsening neck and right shoulder and arm pain with radiation to her shoulder down to her thumb and first finger consistent with both the C5 and C6 nerve root pattern. Workup revealed significant spondylosis with kyphosis spinal cord compression C5-C5 6 patient was recommended anterior cervical discectomies and fusion C4-5 C5-6 after her failure conservative treatment. Risks and benefits of the operation as well as perioperative course and expectations of outcome and alternatives of surgery were all spine the patient she agreed to proceed forward.  Past Medical History  Diagnosis Date  . Hypertension   . Dysrhythmia     PALPITATIONS   . Anxiety   . Depression   . GERD (gastroesophageal reflux disease)     Past Surgical History  Procedure Date  . Dilation and curettage of uterus     X2   . Pituitary surgery     FOR TUMOR 3 YRS   . Joint replacement     RT HIP  2013  (PARTIAL)   . Breast surgery     RT BREAST CYST REMOVED 15 YRS AGO     History reviewed. No pertinent family history. Social History:  reports that she has quit smoking. She does not have any smokeless tobacco history on file. She reports that she drinks alcohol. She reports that she does not use illicit drugs.  Allergies:  Allergies  Allergen Reactions  . Tape Itching    Medications Prior to Admission  Medication Sig Dispense Refill  . amLODipine (NORVASC) 5 MG tablet Take 5 mg by mouth daily.      Marland Kitchen aspirin EC 81 MG tablet Take 81 mg by mouth daily.      . busPIRone (BUSPAR) 7.5 MG tablet Take 7.5 mg by mouth daily.      . metoprolol (LOPRESSOR) 50 MG tablet Take 50 mg by mouth 2 (two) times daily.      . Multiple Vitamin (MULTIVITAMIN WITH MINERALS) TABS Take 1 tablet by mouth daily.      . naproxen sodium (ANAPROX) 220 MG tablet Take 220 mg by mouth 2 (two) times  daily as needed. For pain      . pantoprazole (PROTONIX) 40 MG tablet Take 40 mg by mouth daily.      . sertraline (ZOLOFT) 100 MG tablet Take 100 mg by mouth daily.      Marland Kitchen topiramate (TOPAMAX) 50 MG tablet Take 50 mg by mouth at bedtime.      . traMADol (ULTRAM) 50 MG tablet Take 50 mg by mouth every 6 (six) hours as needed. For pain      . atorvastatin (LIPITOR) 40 MG tablet Take 40 mg by mouth daily.      Marland Kitchen docusate sodium (COLACE) 100 MG capsule Take 100 mg by mouth at bedtime.      . valsartan (DIOVAN) 160 MG tablet Take 160 mg by mouth daily.      Marland Kitchen zolendronic acid (ZOMETA) 4 MG/5ML injection Inject 4 mg into the vein once.        Results for orders placed during the hospital encounter of 10/26/11 (from the past 48 hour(s))  BASIC METABOLIC PANEL     Status: Abnormal   Collection Time   10/26/11  2:10 PM      Component Value Range Comment   Sodium 140  135 - 145 mEq/L  Potassium 3.9  3.5 - 5.1 mEq/L    Chloride 106  96 - 112 mEq/L    CO2 26  19 - 32 mEq/L    Glucose, Bld 86  70 - 99 mg/dL    BUN 17  6 - 23 mg/dL    Creatinine, Ser 1.61  0.50 - 1.10 mg/dL    Calcium 09.6 (*) 8.4 - 10.5 mg/dL    GFR calc non Af Amer 73 (*) >90 mL/min    GFR calc Af Amer 85 (*) >90 mL/min   CBC     Status: Abnormal   Collection Time   10/26/11  2:10 PM      Component Value Range Comment   WBC 11.1 (*) 4.0 - 10.5 K/uL    RBC 4.28  3.87 - 5.11 MIL/uL    Hemoglobin 13.1  12.0 - 15.0 g/dL    HCT 04.5  40.9 - 81.1 %    MCV 91.1  78.0 - 100.0 fL    MCH 30.6  26.0 - 34.0 pg    MCHC 33.6  30.0 - 36.0 g/dL    RDW 91.4  78.2 - 95.6 %    Platelets 277  150 - 400 K/uL   SURGICAL PCR SCREEN     Status: Abnormal   Collection Time   10/26/11  2:24 PM      Component Value Range Comment   MRSA, PCR NEGATIVE  NEGATIVE    Staphylococcus aureus POSITIVE (*) NEGATIVE    Dg Chest 2 View  10/26/2011  *RADIOLOGY REPORT*  Clinical Data: Preop cervical spine surgery  CHEST - 2 VIEW  Comparison: 01/01/2010   Findings: Heart size is upper normal.  Vascularity is normal. Lungs are clear without infiltrate effusion or mass lesion.  IMPRESSION: No active cardiopulmonary disease.   Original Report Authenticated By: Camelia Phenes, M.D.     Review of Systems  Constitutional: Negative.   HENT: Positive for neck pain.   Eyes: Negative.   Respiratory: Negative.   Cardiovascular: Negative.   Gastrointestinal: Negative.   Genitourinary: Negative.   Musculoskeletal: Positive for myalgias.  Skin: Negative.   Neurological: Negative.   Endo/Heme/Allergies: Negative.   Psychiatric/Behavioral: Negative.     Blood pressure 160/88, pulse 55, temperature 97.6 F (36.4 C), temperature source Oral, resp. rate 18, SpO2 97.00%. Physical Exam  Constitutional: She is oriented to person, place, and time.  HENT:  Head: Normocephalic.  Eyes: Pupils are equal, round, and reactive to light.  Neck: Normal range of motion.  Cardiovascular: Normal rate.   Respiratory: Effort normal.  GI: Soft.  Neurological: She is alert and oriented to person, place, and time. GCS eye subscore is 4. GCS verbal subscore is 5. GCS motor subscore is 6.  Reflex Scores:      Tricep reflexes are 1+ on the right side and 1+ on the left side.      Bicep reflexes are 1+ on the right side and 1+ on the left side.      Brachioradialis reflexes are 1+ on the right side and 1+ on the left side.      Patellar reflexes are 1+ on the right side and 1+ on the left side.      Achilles reflexes are 1+ on the left side.      Strength the left buttocks and left ME is 5 out of 5 in her deltoids, biceps, triceps, wrist flexion, wrist extension, and intrinsics. Strength in the right upper extremities she has weakness at  about 4/5 in the right tricep. Otherwise she is 5 out of 5     Assessment/Plan 66 year old female presents for ACDF at C4-5 C5-6  [name] 10/28/2011, 9:54 AM

## 2011-10-28 NOTE — Care Management (Signed)
I was called this morning by short stay regarding patients home situation.  I have spoken with Lupita Leash, social work, and she plans to see the patient in the AM before discharge.  She wants to ensure the patients anesthesia has worn off.  I spoke with the bedside RN, Aishatu, and updated her on social works plan.

## 2011-10-28 NOTE — Anesthesia Postprocedure Evaluation (Signed)
  Anesthesia Post-op Note  Patient: [name]  Procedure(s) Performed: Procedure(s) (LRB) with comments: ANTERIOR CERVICAL DECOMPRESSION/DISCECTOMY FUSION 2 LEVELS (N/A)  Patient Location: PACU  Anesthesia Type: General  Level of Consciousness: awake  Airway and Oxygen Therapy: Patient Spontanous Breathing  Post-op Pain: mild  Post-op Assessment: Post-op Vital signs reviewed  Post-op Vital Signs: Reviewed  Complications: No apparent anesthesia complications 

## 2011-10-28 NOTE — Plan of Care (Signed)
Problem: Consults Goal: Diagnosis - Spinal Surgery Outcome: Completed/Met Date Met:  10/28/11 Cervical Spine Fusion     

## 2011-10-28 NOTE — Anesthesia Preprocedure Evaluation (Addendum)
Anesthesia Evaluation  Patient identified by MRN, date of birth, ID band Patient awake    Reviewed: Allergy & Precautions, H&P , NPO status , Patient's Chart, lab work & pertinent test results, reviewed documented beta blocker date and time   History of Anesthesia Complications Negative for: history of anesthetic complications  Airway Mallampati: II TM Distance: >3 FB Neck ROM: Limited    Dental  (+) Teeth Intact and Dental Advisory Given   Pulmonary former smoker,          Cardiovascular hypertension, Pt. on medications and Pt. on home beta blockers + dysrhythmias Atrial Fibrillation Rhythm:Regular Rate:Normal + Friction Rub    Neuro/Psych Anxiety Depression negative neurological ROS     GI/Hepatic Neg liver ROS, GERD-  Medicated and Controlled,  Endo/Other  negative endocrine ROS  Renal/GU negative Renal ROS     Musculoskeletal negative musculoskeletal ROS (+)   Abdominal   Peds  Hematology negative hematology ROS (+)   Anesthesia Other Findings   Reproductive/Obstetrics negative OB ROS                         Anesthesia Physical Anesthesia Plan  ASA: III  Anesthesia Plan: General   Post-op Pain Management:    Induction: Intravenous  Airway Management Planned: Oral ETT  Additional Equipment:   Intra-op Plan:   Post-operative Plan: Extubation in OR  Informed Consent:   Plan Discussed with: Anesthesiologist, CRNA and Surgeon  Anesthesia Plan Comments:         Anesthesia Quick Evaluation

## 2011-10-28 NOTE — Preoperative (Signed)
Beta Blockers   Reason not to administer Beta Blockers:Not Applicable 

## 2011-10-28 NOTE — Transfer of Care (Signed)
Immediate Anesthesia Transfer of Care Note  Patient: [name]  Procedure(s) Performed: Procedure(s) (LRB) with comments: ANTERIOR CERVICAL DECOMPRESSION/DISCECTOMY FUSION 2 LEVELS (N/A)  Patient Location: PACU  Anesthesia Type: General  Level of Consciousness: awake, alert  and oriented  Airway & Oxygen Therapy: Patient Spontanous Breathing and Patient connected to nasal cannula oxygen  Post-op Assessment: Report given to PACU RN and Post -op Vital signs reviewed and stable  Post vital signs: Reviewed and stable  Complications: No apparent anesthesia complications

## 2011-10-28 NOTE — Anesthesia Postprocedure Evaluation (Signed)
  Anesthesia Post-op Note  Patient: [name]  Procedure(s) Performed: Procedure(s) (LRB) with comments: ANTERIOR CERVICAL DECOMPRESSION/DISCECTOMY FUSION 2 LEVELS (N/A)  Patient Location: PACU  Anesthesia Type: General  Level of Consciousness: awake  Airway and Oxygen Therapy: Patient Spontanous Breathing  Post-op Pain: mild  Post-op Assessment: Post-op Vital signs reviewed  Post-op Vital Signs: Reviewed  Complications: No apparent anesthesia complications

## 2011-10-28 NOTE — Progress Notes (Signed)
Patient stress test was still not on chart after requesting 9/16 -- refaxed request for stress test results -- pt believes 2010 if not longer.
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
## 2011-10-29 ENCOUNTER — Encounter (HOSPITAL_COMMUNITY): Payer: Self-pay | Admitting: Neurosurgery

## 2011-10-29 NOTE — Progress Notes (Signed)
Pt and family given D/C instructions with verbal understanding. Pt D/C'd home with family via wheelchair per MD order. Called Dr. Lonie Peak office and spoke with Rolly Salter regarding Pt requesting Flexiril for D/C. Rema Fendt, RN

## 2011-10-29 NOTE — Progress Notes (Signed)
Subjective: Patient reports She's doing very well significant proven on arm and leg pain swallowing is good  Objective: Vital signs in last 24 hours: Temp:  [97.7 F (36.5 C)-98.8 F (37.1 C)] 98.5 F (36.9 C) (09/19 0819) Pulse Rate:  [49-79] 50  (09/19 0819) Resp:  [13-28] 20  (09/19 0819) BP: (113-179)/(47-127) 141/79 mmHg (09/19 0819) SpO2:  [92 %-99 %] 95 % (09/19 0819)  Intake/Output from previous day: 09/18 0701 - 09/19 0700 In: 2340 [P.O.:840; I.V.:1500] Out: 470 [Urine:400; Blood:70] Intake/Output this shift:    Strength is 5 out of 5 wound is clean and dry  Lab Results:  Basename 10/26/11 1410  WBC 11.1*  HGB 13.1  HCT 39.0  PLT 277   BMET  Basename 10/26/11 1410  NA 140  K 3.9  CL 106  CO2 26  GLUCOSE 86  BUN 17  CREATININE 0.83  CALCIUM 11.1*    Studies/Results: Dg Cervical Spine 1 View  10/28/2011  *RADIOLOGY REPORT*  Clinical Data: 66 year old female undergoing cervical spine surgery.  DG C-ARM 1-60 MIN,DG CERVICAL SPINE - 1 VIEW  Technique: Intraoperative fluoroscopic lateral view of the cervical spine at 1043 hours.  Fluoroscopy time of 0.1 minutes was utilized.  Comparison:   Cervical MRI 06/23/2011.  Findings:  C4-C5 and C5-C6 ACDF hardware in place.  IMPRESSION: ACDF hardware at C4-C5 and C5-C6.   Original Report Authenticated By: Harley Hallmark, M.D.    Dg C-arm 1-60 Min  10/28/2011  *RADIOLOGY REPORT*  Clinical Data: 66 year old female undergoing cervical spine surgery.  DG C-ARM 1-60 MIN,DG CERVICAL SPINE - 1 VIEW  Technique: Intraoperative fluoroscopic lateral view of the cervical spine at 1043 hours.  Fluoroscopy time of 0.1 minutes was utilized.  Comparison:   Cervical MRI 06/23/2011.  Findings:  C4-C5 and C5-C6 ACDF hardware in place.  IMPRESSION: ACDF hardware at C4-C5 and C5-C6.   Original Report Authenticated By: Harley Hallmark, M.D.     Assessment/Plan: Posterior day 1 ACDF plan discharge home  LOS: 1 day     [name]  P 10/29/2011, 9:12 AM

## 2011-10-29 NOTE — Discharge Summary (Signed)
  Physician Discharge Summary  Patient ID: KYAH BUESING MRN: [id number] DOB/AGE: August 13, 1945 66 y.o.  Admit date: 10/28/2011 Discharge date: 10/29/2011  Admission Diagnoses: Cervical spondylosis with radiculopathy C4-5 C5-6  Discharge Diagnoses: Same Active Problems:  * No active hospital problems. *    Discharged Condition: good  Hospital Course: The operating room underwent ACDF at C5-C5 6 postop patient did very well recovered in the floor on the floor patient, as well as significant proven of both arm and leg pain swallowing fine pain was well-controlled she still be discharged home scheduled followup approximately 1-2 weeks she does have medications at home the  Consults: Significant Diagnostic Studies: Treatments: ACDF C5-C5 6 Discharge Exam: Blood pressure 141/79, pulse 50, temperature 98.5 F (36.9 C), temperature source Oral, resp. rate 20, SpO2 95.00%. Strength out of 5 wound clean and dry  Disposition: Home     Medication List     As of 10/29/2011  9:14 AM    TAKE these medications         amLODipine 5 MG tablet   Commonly known as: NORVASC   Take 5 mg by mouth daily.      aspirin EC 81 MG tablet   Take 81 mg by mouth daily.      atorvastatin 40 MG tablet   Commonly known as: LIPITOR   Take 40 mg by mouth daily.      busPIRone 7.5 MG tablet   Commonly known as: BUSPAR   Take 7.5 mg by mouth daily.      docusate sodium 100 MG capsule   Commonly known as: COLACE   Take 100 mg by mouth at bedtime.      metoprolol 50 MG tablet   Commonly known as: LOPRESSOR   Take 50 mg by mouth 2 (two) times daily.      multivitamin with minerals Tabs   Take 1 tablet by mouth daily.      naproxen sodium 220 MG tablet   Commonly known as: ANAPROX   Take 220 mg by mouth 2 (two) times daily as needed. For pain      pantoprazole 40 MG tablet   Commonly known as: PROTONIX   Take 40 mg by mouth daily.      sertraline 100 MG tablet   Commonly known as: ZOLOFT   Take 100 mg by mouth daily.      topiramate 50 MG tablet   Commonly known as: TOPAMAX   Take 50 mg by mouth at bedtime.      traMADol 50 MG tablet   Commonly known as: ULTRAM   Take 50 mg by mouth every 6 (six) hours as needed. For pain      valsartan 160 MG tablet   Commonly known as: DIOVAN   Take 160 mg by mouth daily.      zolendronic acid 4 MG/5ML injection   Commonly known as: ZOMETA   Inject 4 mg into the vein once.         Signed: Quinto Tippy P 10/29/2011, 9:14 AM

## 2011-12-22 ENCOUNTER — Ambulatory Visit: Payer: Self-pay | Admitting: Neurosurgery

## 2012-01-25 ENCOUNTER — Encounter: Payer: Self-pay | Admitting: *Deleted

## 2012-01-25 ENCOUNTER — Encounter: Payer: Self-pay | Admitting: Internal Medicine

## 2012-01-25 ENCOUNTER — Ambulatory Visit (INDEPENDENT_AMBULATORY_CARE_PROVIDER_SITE_OTHER): Payer: Federal, State, Local not specified - PPO | Admitting: Internal Medicine

## 2012-01-25 VITALS — BP 158/107 | HR 57 | Temp 98.0°F | Ht 66.5 in | Wt 158.5 lb

## 2012-01-25 DIAGNOSIS — E7800 Pure hypercholesterolemia, unspecified: Secondary | ICD-10-CM

## 2012-01-25 DIAGNOSIS — I10 Essential (primary) hypertension: Secondary | ICD-10-CM

## 2012-01-25 DIAGNOSIS — F3289 Other specified depressive episodes: Secondary | ICD-10-CM

## 2012-01-25 DIAGNOSIS — F329 Major depressive disorder, single episode, unspecified: Secondary | ICD-10-CM

## 2012-01-25 DIAGNOSIS — F419 Anxiety disorder, unspecified: Secondary | ICD-10-CM

## 2012-01-25 DIAGNOSIS — F5104 Psychophysiologic insomnia: Secondary | ICD-10-CM

## 2012-01-25 DIAGNOSIS — G4700 Insomnia, unspecified: Secondary | ICD-10-CM

## 2012-01-25 DIAGNOSIS — F32A Depression, unspecified: Secondary | ICD-10-CM

## 2012-01-25 DIAGNOSIS — F411 Generalized anxiety disorder: Secondary | ICD-10-CM

## 2012-01-25 MED ORDER — PANTOPRAZOLE SODIUM 40 MG PO TBEC: 40.0000 mg | DELAYED_RELEASE_TABLET | Freq: Every day | ORAL | Status: DC

## 2012-02-01 ENCOUNTER — Encounter: Payer: Self-pay | Admitting: Internal Medicine

## 2012-02-01 DIAGNOSIS — I10 Essential (primary) hypertension: Secondary | ICD-10-CM | POA: Insufficient documentation

## 2012-02-01 DIAGNOSIS — F419 Anxiety disorder, unspecified: Secondary | ICD-10-CM | POA: Insufficient documentation

## 2012-02-01 DIAGNOSIS — E7800 Pure hypercholesterolemia, unspecified: Secondary | ICD-10-CM | POA: Insufficient documentation

## 2012-02-01 DIAGNOSIS — F5104 Psychophysiologic insomnia: Secondary | ICD-10-CM | POA: Insufficient documentation

## 2012-02-01 DIAGNOSIS — F329 Major depressive disorder, single episode, unspecified: Secondary | ICD-10-CM | POA: Insufficient documentation

## 2012-02-01 DIAGNOSIS — F32A Depression, unspecified: Secondary | ICD-10-CM | POA: Insufficient documentation

## 2012-02-01 NOTE — Assessment & Plan Note (Signed)
Seeing Dr Maryruth Bun now.  Anxious today.  Follow.

## 2012-02-01 NOTE — Assessment & Plan Note (Signed)
Blood pressure is elevated today.  States she is anxious.  Will have her spot check her pressures and record.  Get her back in soon to reassess.

## 2012-02-01 NOTE — Assessment & Plan Note (Signed)
Seeing Dr Maryruth Bun.  On Sertraline.  Follow.

## 2012-02-01 NOTE — Assessment & Plan Note (Signed)
On Atorvastatin.  Low cholesterol diet and exercise.  Obtain outside records.  Just had labs.

## 2012-02-01 NOTE — Assessment & Plan Note (Signed)
Sees Dr Maryruth Bun.  Follow.
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
## 2012-02-18 ENCOUNTER — Encounter: Payer: Self-pay | Admitting: Internal Medicine

## 2012-02-18 ENCOUNTER — Ambulatory Visit (INDEPENDENT_AMBULATORY_CARE_PROVIDER_SITE_OTHER): Payer: Federal, State, Local not specified - PPO | Admitting: Internal Medicine

## 2012-02-18 VITALS — BP 145/79 | HR 79 | Temp 98.0°F | Ht 65.0 in | Wt 158.0 lb

## 2012-02-18 DIAGNOSIS — R739 Hyperglycemia, unspecified: Secondary | ICD-10-CM

## 2012-02-18 DIAGNOSIS — E7800 Pure hypercholesterolemia, unspecified: Secondary | ICD-10-CM

## 2012-02-18 DIAGNOSIS — R7309 Other abnormal glucose: Secondary | ICD-10-CM

## 2012-02-18 DIAGNOSIS — F5104 Psychophysiologic insomnia: Secondary | ICD-10-CM

## 2012-02-18 DIAGNOSIS — F329 Major depressive disorder, single episode, unspecified: Secondary | ICD-10-CM

## 2012-02-18 DIAGNOSIS — F411 Generalized anxiety disorder: Secondary | ICD-10-CM

## 2012-02-18 DIAGNOSIS — I10 Essential (primary) hypertension: Secondary | ICD-10-CM

## 2012-02-18 DIAGNOSIS — F32A Depression, unspecified: Secondary | ICD-10-CM

## 2012-02-18 DIAGNOSIS — R5381 Other malaise: Secondary | ICD-10-CM

## 2012-02-18 DIAGNOSIS — F3289 Other specified depressive episodes: Secondary | ICD-10-CM

## 2012-02-18 DIAGNOSIS — F419 Anxiety disorder, unspecified: Secondary | ICD-10-CM

## 2012-02-18 DIAGNOSIS — R5383 Other fatigue: Secondary | ICD-10-CM

## 2012-02-18 DIAGNOSIS — G4700 Insomnia, unspecified: Secondary | ICD-10-CM

## 2012-02-21 ENCOUNTER — Encounter: Payer: Self-pay | Admitting: Internal Medicine

## 2012-02-21 NOTE — Assessment & Plan Note (Signed)
Seeing Dr Kapur.   

## 2012-02-21 NOTE — Assessment & Plan Note (Signed)
Low cholesterol diet.  Check lipid panel with next labs.

## 2012-02-21 NOTE — Assessment & Plan Note (Signed)
Blood pressure improved.  Will continue same med regimen.  Feels a lot of her elevated blood pressure comes from the increased stress and anxiety.  Seeing Dr Maryruth Bun for treatment of this.  Hold on additional blood pressure medications.  Follow closely.  Get her back in soon to reassess.  Have her to continue to spot check her readings.  She will bring her cuff next visit.
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
## 2012-02-21 NOTE — Assessment & Plan Note (Signed)
Seeing Dr Maryruth Bun.  She is following closely.  Appears to be doing some better.   Follow.

## 2012-04-05 ENCOUNTER — Other Ambulatory Visit: Payer: Self-pay | Admitting: Internal Medicine

## 2012-04-06 NOTE — Telephone Encounter (Signed)
Sent in to pharmacy.  

## 2012-04-26 ENCOUNTER — Other Ambulatory Visit: Payer: Federal, State, Local not specified - PPO

## 2012-04-28 ENCOUNTER — Other Ambulatory Visit (INDEPENDENT_AMBULATORY_CARE_PROVIDER_SITE_OTHER): Payer: Federal, State, Local not specified - PPO

## 2012-04-28 DIAGNOSIS — I10 Essential (primary) hypertension: Secondary | ICD-10-CM

## 2012-04-28 DIAGNOSIS — E7800 Pure hypercholesterolemia, unspecified: Secondary | ICD-10-CM

## 2012-04-28 DIAGNOSIS — R5383 Other fatigue: Secondary | ICD-10-CM

## 2012-04-28 DIAGNOSIS — R7309 Other abnormal glucose: Secondary | ICD-10-CM

## 2012-04-28 DIAGNOSIS — R739 Hyperglycemia, unspecified: Secondary | ICD-10-CM

## 2012-04-28 DIAGNOSIS — R5381 Other malaise: Secondary | ICD-10-CM

## 2012-04-28 LAB — CBC WITH DIFFERENTIAL/PLATELET
Basophils Absolute: 0.1 10*3/uL (ref 0.0–0.1)
HCT: 38.5 % (ref 36.0–46.0)
Hemoglobin: 12.8 g/dL (ref 12.0–15.0)
Lymphs Abs: 2.1 10*3/uL (ref 0.7–4.0)
MCHC: 33.2 g/dL (ref 30.0–36.0)
MCV: 92.4 fl (ref 78.0–100.0)
Monocytes Absolute: 1 10*3/uL (ref 0.1–1.0)
Monocytes Relative: 12 % (ref 3.0–12.0)
Neutro Abs: 4.6 10*3/uL (ref 1.4–7.7)
RDW: 14.6 % (ref 11.5–14.6)

## 2012-04-28 LAB — COMPREHENSIVE METABOLIC PANEL
ALT: 20 U/L (ref 0–35)
CO2: 27 mEq/L (ref 19–32)
Creatinine, Ser: 0.7 mg/dL (ref 0.4–1.2)
GFR: 92.26 mL/min (ref >60.00)
Total Bilirubin: 0.7 mg/dL (ref 0.3–1.2)

## 2012-04-28 LAB — TSH: TSH: 1.3 u[IU]/mL (ref 0.35–5.50)

## 2012-04-28 LAB — LIPID PANEL
HDL: 56.9 mg/dL (ref >39.00)
Total CHOL/HDL Ratio: 3
Triglycerides: 137 mg/dL (ref 0.0–149.0)

## 2012-04-29 ENCOUNTER — Encounter: Payer: Self-pay | Admitting: Internal Medicine

## 2012-05-02 ENCOUNTER — Ambulatory Visit: Payer: Self-pay | Admitting: Internal Medicine

## 2012-05-02 ENCOUNTER — Encounter: Payer: Self-pay | Admitting: Internal Medicine

## 2012-05-02 ENCOUNTER — Ambulatory Visit (INDEPENDENT_AMBULATORY_CARE_PROVIDER_SITE_OTHER): Payer: Federal, State, Local not specified - PPO | Admitting: Internal Medicine

## 2012-05-02 VITALS — BP 130/90 | HR 57 | Temp 97.5°F | Ht 65.0 in | Wt 166.8 lb

## 2012-05-02 DIAGNOSIS — F419 Anxiety disorder, unspecified: Secondary | ICD-10-CM

## 2012-05-02 DIAGNOSIS — R600 Localized edema: Secondary | ICD-10-CM

## 2012-05-02 DIAGNOSIS — I10 Essential (primary) hypertension: Secondary | ICD-10-CM

## 2012-05-02 DIAGNOSIS — F3289 Other specified depressive episodes: Secondary | ICD-10-CM

## 2012-05-02 DIAGNOSIS — F411 Generalized anxiety disorder: Secondary | ICD-10-CM

## 2012-05-02 DIAGNOSIS — E7800 Pure hypercholesterolemia, unspecified: Secondary | ICD-10-CM

## 2012-05-02 DIAGNOSIS — Z1239 Encounter for other screening for malignant neoplasm of breast: Secondary | ICD-10-CM

## 2012-05-02 DIAGNOSIS — I498 Other specified cardiac arrhythmias: Secondary | ICD-10-CM

## 2012-05-02 DIAGNOSIS — G4700 Insomnia, unspecified: Secondary | ICD-10-CM

## 2012-05-02 DIAGNOSIS — R001 Bradycardia, unspecified: Secondary | ICD-10-CM

## 2012-05-02 DIAGNOSIS — R609 Edema, unspecified: Secondary | ICD-10-CM

## 2012-05-02 DIAGNOSIS — F329 Major depressive disorder, single episode, unspecified: Secondary | ICD-10-CM

## 2012-05-02 DIAGNOSIS — Z01419 Encounter for gynecological examination (general) (routine) without abnormal findings: Secondary | ICD-10-CM

## 2012-05-02 DIAGNOSIS — F5104 Psychophysiologic insomnia: Secondary | ICD-10-CM

## 2012-05-02 DIAGNOSIS — F32A Depression, unspecified: Secondary | ICD-10-CM

## 2012-05-02 MED ORDER — CHLORTHALIDONE 25 MG PO TABS: ORAL_TABLET | ORAL | Status: DC

## 2012-05-02 MED ORDER — CHLORTHALIDONE 25 MG PO TABS: 25.0000 mg | ORAL_TABLET | Freq: Every day | ORAL | Status: DC

## 2012-05-03 ENCOUNTER — Other Ambulatory Visit (HOSPITAL_COMMUNITY)
Admission: RE | Admit: 2012-05-03 | Discharge: 2012-05-03 | Disposition: A | Discharge status: Home / Self Care | Payer: Medicare Other | Source: Ambulatory Visit | Attending: Internal Medicine | Admitting: Internal Medicine

## 2012-05-03 ENCOUNTER — Encounter: Payer: Self-pay | Admitting: Internal Medicine

## 2012-05-03 ENCOUNTER — Ambulatory Visit: Payer: Self-pay | Admitting: Internal Medicine

## 2012-05-03 DIAGNOSIS — Z1151 Encounter for screening for human papillomavirus (HPV): Secondary | ICD-10-CM | POA: Diagnosis present

## 2012-05-03 DIAGNOSIS — Z01419 Encounter for gynecological examination (general) (routine) without abnormal findings: Secondary | ICD-10-CM | POA: Diagnosis present
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
## 2012-05-03 NOTE — Assessment & Plan Note (Signed)
Sees Dr Kapur.  Follow.  

## 2012-05-03 NOTE — Assessment & Plan Note (Signed)
Seeing Dr Kapur.  She is following closely.  Appears to be doing better.  On sertraline and abilify now.  Follow.       

## 2012-05-03 NOTE — Assessment & Plan Note (Signed)
Seeing Dr Kapur.  On Sertraline and Abilify now.  Follow.          

## 2012-05-03 NOTE — Addendum Note (Signed)
Addended by: Montine Circle D on: 05/03/2012 01:57 PM   Modules accepted: Orders

## 2012-05-03 NOTE — Assessment & Plan Note (Signed)
Low cholesterol diet.  Lipid profile checked 04/28/12 revealed total cholesterol 163, triglycerides 137, HDL 57 and LDL 79.  Follow.             

## 2012-05-03 NOTE — Assessment & Plan Note (Signed)
Blood pressure as outlined.  Swelling and bradycardia as outlied.  Stop the amlodipine.  Decrease metoprolol to 1/2 tablet bid.  Start chlorthalidone 25mg  q day.  Get her back in soon to reassess.  Follow.

## 2012-05-08 ENCOUNTER — Other Ambulatory Visit: Payer: Self-pay | Admitting: Internal Medicine

## 2012-05-09 ENCOUNTER — Encounter: Payer: Self-pay | Admitting: Internal Medicine

## 2012-05-19 ENCOUNTER — Ambulatory Visit: Payer: Federal, State, Local not specified - PPO | Admitting: Internal Medicine

## 2012-05-19 ENCOUNTER — Encounter: Payer: Self-pay | Admitting: Internal Medicine

## 2012-05-24 ENCOUNTER — Ambulatory Visit (INDEPENDENT_AMBULATORY_CARE_PROVIDER_SITE_OTHER): Payer: Medicare Other | Admitting: Internal Medicine

## 2012-05-24 ENCOUNTER — Encounter: Payer: Self-pay | Admitting: Internal Medicine

## 2012-05-24 VITALS — BP 140/90 | HR 59 | Temp 98.6°F | Ht 65.0 in | Wt 165.8 lb

## 2012-05-24 DIAGNOSIS — F5104 Psychophysiologic insomnia: Secondary | ICD-10-CM

## 2012-05-24 DIAGNOSIS — G4700 Insomnia, unspecified: Secondary | ICD-10-CM

## 2012-05-24 DIAGNOSIS — F419 Anxiety disorder, unspecified: Secondary | ICD-10-CM

## 2012-05-24 DIAGNOSIS — F411 Generalized anxiety disorder: Secondary | ICD-10-CM

## 2012-05-24 DIAGNOSIS — F32A Depression, unspecified: Secondary | ICD-10-CM

## 2012-05-24 DIAGNOSIS — E7800 Pure hypercholesterolemia, unspecified: Secondary | ICD-10-CM

## 2012-05-24 DIAGNOSIS — F329 Major depressive disorder, single episode, unspecified: Secondary | ICD-10-CM

## 2012-05-24 DIAGNOSIS — M7989 Other specified soft tissue disorders: Secondary | ICD-10-CM

## 2012-05-24 DIAGNOSIS — F3289 Other specified depressive episodes: Secondary | ICD-10-CM

## 2012-05-24 DIAGNOSIS — I10 Essential (primary) hypertension: Secondary | ICD-10-CM

## 2012-05-24 LAB — BASIC METABOLIC PANEL
BUN: 25 mg/dL — ABNORMAL HIGH (ref 6–23)
GFR: 78.74 mL/min (ref >60.00)
Potassium: 3.9 mEq/L (ref 3.5–5.1)

## 2012-05-25 ENCOUNTER — Other Ambulatory Visit: Payer: Self-pay | Admitting: Internal Medicine

## 2012-05-25 NOTE — Progress Notes (Signed)
Order placed for follow up calcium lab

## 2012-05-29 ENCOUNTER — Encounter: Payer: Self-pay | Admitting: Internal Medicine

## 2012-05-29 NOTE — Assessment & Plan Note (Signed)
Seeing Dr Maryruth Bun.  She is following closely.  Appears to be doing better.  On sertraline and abilify now.  Follow.
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
## 2012-05-29 NOTE — Assessment & Plan Note (Signed)
Seeing Dr Kapur.  On Sertraline and Abilify now.  Follow.          

## 2012-05-29 NOTE — Assessment & Plan Note (Signed)
Sees Dr Maryruth Bun.  Follow.

## 2012-05-29 NOTE — Assessment & Plan Note (Signed)
Blood pressure as outlined.  Medication adjustments made last visit - as outlined.  Continue same meds.  Check metabolic panel.

## 2012-05-29 NOTE — Assessment & Plan Note (Signed)
Low cholesterol diet.  Lipid profile checked 04/28/12 revealed total cholesterol 163, triglycerides 137, HDL 57 and LDL 79.  Follow.

## 2012-06-07 ENCOUNTER — Other Ambulatory Visit: Payer: Self-pay | Admitting: Internal Medicine

## 2012-06-07 NOTE — Telephone Encounter (Signed)
Please advise...Marland KitchenMarland KitchenRefill  Tramadol (Ultram) 50mg  Take 1 to 2 tablets by mouth 4 to 6 hours as needed for pain #[id number]

## 2012-06-08 ENCOUNTER — Telehealth: Payer: Self-pay | Admitting: Internal Medicine

## 2012-06-08 ENCOUNTER — Other Ambulatory Visit (INDEPENDENT_AMBULATORY_CARE_PROVIDER_SITE_OTHER): Payer: Medicare Other

## 2012-06-08 LAB — CALCIUM: Calcium: 10.8 mg/dL — ABNORMAL HIGH (ref 8.4–10.5)

## 2012-06-08 LAB — CALCIUM, IONIZED: Calcium, Ion: 1.45 mmol/L — ABNORMAL HIGH (ref 1.12–1.32)

## 2012-06-08 NOTE — Telephone Encounter (Signed)
If leg is feeling hot and is swollen and she is having hot and cold chills - she needs eval today to confirm no infection, etc.  Recommend to acute care for evaluation.  I have already sent in refill for tramadol.

## 2012-06-08 NOTE — Telephone Encounter (Signed)
Patient Information:  Caller Name: Merita  Phone: [phone]  Patient: Vladimir Creeks  Gender: Female  DOB: 1945/08/23  Age: 67 Years  PCP: [facility] [place]  Office Follow Up:  Does the office need to follow up with this patient?: Yes  Instructions For The Office: Patient triages to be seen in office today for (1) Leg edema and (2) UTI symptoms. NO APPT AVAILABLE . PLEASE CONTACT   Symptoms  Reason For Call & Symptoms: Patient reports ongoing leg swelling since being seen 05/24/12. She was treated with diuretic, support hose and Tramadol. She is out of her pain medication for 5 days. legs feels hot and tender. no weeping . Occasional shortness of breath. (2) Frequency in urination, back pain , pressure onset 4 days.ago 06/03/12.  Hot and cold and chills. Does not have thermometer.  Reviewed Health History In EMR: Yes  Reviewed Medications In EMR: Yes  Reviewed Allergies In EMR: Yes  Reviewed Surgeries / Procedures: Yes  Date of Onset of Symptoms: 05/24/2012  Treatments Tried: support hose, diuretic and Tramadol  Treatments Tried Worked: No  Guideline(s) Used:  Leg Swelling and Edema  Urination Pain - Female  Disposition Per Guideline:   Go to Office Now  Reason For Disposition Reached:   Side (flank) or lower back pain present  Advice Given:  Call Back If:  Swelling becomes worse  Swelling becomes red or painful to the touch  You become worse.  Fluids:   Drink extra fluids. Drink 8-10 glasses of liquids a day (Reason: to produce a dilute, non-irritating urine).  Call Back If:  You become worse.  Patient Will Follow Care Advice:  YES

## 2012-06-08 NOTE — Telephone Encounter (Signed)
Patient states she take 1 pill every 6 hours and at times she as taking 2 pills every 6 hours. She tries not to take 2 pills cause she wants them to last longer. But she has pain in her legs, feet, and knees. Burning and stinging, but she feel like that it coming from her arthritis.

## 2012-06-08 NOTE — Telephone Encounter (Signed)
Please call pt and find out how often she is taking this medication and what specific pain she is taking the medication for.

## 2012-06-08 NOTE — Telephone Encounter (Signed)
Refilled tramadol #60 with no refills.  Will discuss with pt at her f/u appt soon

## 2012-06-09 NOTE — Telephone Encounter (Signed)
Spoke with pt & she stated that her symptoms are better since restating on the Tramadol. I advised pt that if sx's return or continue, she needs to be seen here or Acute care to rule out infection. Pt verbalized understanding

## 2012-06-14 ENCOUNTER — Encounter: Payer: Self-pay | Admitting: Internal Medicine

## 2012-06-14 ENCOUNTER — Ambulatory Visit (INDEPENDENT_AMBULATORY_CARE_PROVIDER_SITE_OTHER): Payer: Medicare Other | Admitting: Internal Medicine

## 2012-06-14 DIAGNOSIS — F419 Anxiety disorder, unspecified: Secondary | ICD-10-CM

## 2012-06-14 DIAGNOSIS — F329 Major depressive disorder, single episode, unspecified: Secondary | ICD-10-CM

## 2012-06-14 DIAGNOSIS — F5104 Psychophysiologic insomnia: Secondary | ICD-10-CM

## 2012-06-14 DIAGNOSIS — F3289 Other specified depressive episodes: Secondary | ICD-10-CM

## 2012-06-14 DIAGNOSIS — I10 Essential (primary) hypertension: Secondary | ICD-10-CM

## 2012-06-14 DIAGNOSIS — E7800 Pure hypercholesterolemia, unspecified: Secondary | ICD-10-CM

## 2012-06-14 DIAGNOSIS — F411 Generalized anxiety disorder: Secondary | ICD-10-CM

## 2012-06-14 DIAGNOSIS — F32A Depression, unspecified: Secondary | ICD-10-CM

## 2012-06-14 DIAGNOSIS — R609 Edema, unspecified: Secondary | ICD-10-CM

## 2012-06-14 DIAGNOSIS — G4700 Insomnia, unspecified: Secondary | ICD-10-CM

## 2012-06-14 MED ORDER — FUROSEMIDE 20 MG PO TABS: 20.0000 mg | ORAL_TABLET | Freq: Every day | ORAL | Status: DC

## 2012-06-19 ENCOUNTER — Encounter: Payer: Self-pay | Admitting: Internal Medicine
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
## 2012-06-19 NOTE — Assessment & Plan Note (Signed)
Seeing Dr Maryruth Bun.  On Sertraline and Abilify now.  Follow.

## 2012-06-19 NOTE — Assessment & Plan Note (Signed)
Seeing Dr Kapur.  She is following closely.  Appears to be doing better.  On sertraline and abilify now.  Follow.       

## 2012-06-19 NOTE — Assessment & Plan Note (Signed)
Blood pressure as outlined. Doing much better.  Will stop chlorthalidone and start lasix.  With hypercalcemia.  Recheck calcium level.  Follow blood pressure.

## 2012-06-19 NOTE — Assessment & Plan Note (Signed)
Low cholesterol diet.  Lipid profile checked 04/28/12 revealed total cholesterol 163, triglycerides 137, HDL 57 and LDL 79.  Follow.             

## 2012-06-19 NOTE — Assessment & Plan Note (Signed)
Will stop chlorthalidone.  Start lasix.  Follow calcium.  Check intact PTH.

## 2012-06-19 NOTE — Assessment & Plan Note (Signed)
Sees Dr Kapur.  Follow.  

## 2012-06-23 ENCOUNTER — Telehealth: Payer: Self-pay | Admitting: *Deleted

## 2012-06-23 DIAGNOSIS — R609 Edema, unspecified: Secondary | ICD-10-CM

## 2012-06-23 NOTE — Telephone Encounter (Signed)
Orders placed.

## 2012-06-23 NOTE — Telephone Encounter (Signed)
Pt is coming in for labs tomorrow 05.16.2014 what labs and dx?  Thank you

## 2012-06-24 ENCOUNTER — Other Ambulatory Visit (INDEPENDENT_AMBULATORY_CARE_PROVIDER_SITE_OTHER): Payer: Medicare Other

## 2012-06-24 DIAGNOSIS — R609 Edema, unspecified: Secondary | ICD-10-CM

## 2012-06-24 LAB — ELECTROLYTE PANEL: CO2: 32 mEq/L (ref 19–32)

## 2012-06-24 LAB — CREATININE, SERUM: Creatinine, Ser: 0.7 mg/dL (ref 0.4–1.2)

## 2012-06-27 LAB — PTH, INTACT AND CALCIUM
Calcium, Total (PTH): 10.6 mg/dL — ABNORMAL HIGH (ref 8.4–10.5)
PTH: 106.4 pg/mL — ABNORMAL HIGH (ref 14.0–72.0)

## 2012-07-01 ENCOUNTER — Other Ambulatory Visit: Payer: Self-pay | Admitting: Internal Medicine

## 2012-07-01 NOTE — Telephone Encounter (Signed)
Pt called wanting to get refill Tramadol [facility] [address] Pt is out of meds Please advise when this is called in

## 2012-07-01 NOTE — Telephone Encounter (Signed)
Okay to refill? 

## 2012-07-03 ENCOUNTER — Telehealth: Payer: Self-pay | Admitting: Internal Medicine

## 2012-07-03 NOTE — Telephone Encounter (Signed)
Pt notified of labs and need for parathyroid scan.  Tried to place order for scan.  Could not find way to order.  I need a parathyroid uptake and scan.  (also called sestamibi parathyroid scan).  Computer would not let me order either of these.  Can we get his scheduled.  Pt has elevated calcium and elevated Intact PTH (parathyroid hormone).  Thanks.

## 2012-07-12 ENCOUNTER — Ambulatory Visit: Payer: Self-pay | Admitting: Internal Medicine

## 2012-07-18 ENCOUNTER — Ambulatory Visit (INDEPENDENT_AMBULATORY_CARE_PROVIDER_SITE_OTHER): Payer: Medicare Other | Admitting: Internal Medicine

## 2012-07-18 ENCOUNTER — Encounter: Payer: Self-pay | Admitting: Internal Medicine

## 2012-07-18 VITALS — BP 124/100 | HR 56 | Temp 98.2°F | Ht 65.0 in | Wt 168.2 lb

## 2012-07-18 DIAGNOSIS — F3289 Other specified depressive episodes: Secondary | ICD-10-CM

## 2012-07-18 DIAGNOSIS — F32A Depression, unspecified: Secondary | ICD-10-CM

## 2012-07-18 DIAGNOSIS — F419 Anxiety disorder, unspecified: Secondary | ICD-10-CM

## 2012-07-18 DIAGNOSIS — F329 Major depressive disorder, single episode, unspecified: Secondary | ICD-10-CM

## 2012-07-18 DIAGNOSIS — E213 Hyperparathyroidism, unspecified: Secondary | ICD-10-CM

## 2012-07-18 DIAGNOSIS — E7800 Pure hypercholesterolemia, unspecified: Secondary | ICD-10-CM

## 2012-07-18 DIAGNOSIS — I10 Essential (primary) hypertension: Secondary | ICD-10-CM

## 2012-07-18 DIAGNOSIS — F411 Generalized anxiety disorder: Secondary | ICD-10-CM

## 2012-07-18 NOTE — Assessment & Plan Note (Signed)
Low cholesterol diet.  Lipid profile checked 04/28/12 revealed total cholesterol 163, triglycerides 137, HDL 57 and LDL 79.  Follow.             

## 2012-07-18 NOTE — Assessment & Plan Note (Signed)
Blood pressure as outlined.  Was doing better.  Off chlorthalidone and on lasix now. Elevated.  Increase metoprolol to 50mg  q am and 1/2 (50mg ) q pm.  Monitor for bradycardia.  Follow pressures.  Just starting wellbutrin.  Get her back in soon to reassess.

## 2012-07-18 NOTE — Assessment & Plan Note (Addendum)
Seeing Dr Maryruth Bun.  Just started wellbutrin.  Off abilfy.  Continues f/u with Dr Maryruth Bun.
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
## 2012-07-18 NOTE — Assessment & Plan Note (Signed)
Increased calcium and increased Intact PTH.  Parathyroid uptake and scan revealed parathyroid adenoma posterior to the left thyroid lobe.  (may be two parathyroid glands adjacent to each other).  See scan report for details.  Has known osteoporosis per her report.  Need to obtain scans.  Receiving Reclast.  Continue vitamin D.  Follow.  Stay hydrated.  Refer to Dr Gerrit Friends for further evaluation and treatment.  Pt comfortable with this plan.

## 2012-07-18 NOTE — Assessment & Plan Note (Signed)
Seeing Dr Maryruth Bun.  She is following closely.  Appears to be doing better.  Follow.

## 2012-07-25 ENCOUNTER — Telehealth: Payer: Self-pay | Admitting: Internal Medicine

## 2012-07-25 NOTE — Telephone Encounter (Signed)
traMADol (ULTRAM) 50 MG tablet ° °

## 2012-07-25 NOTE — Telephone Encounter (Signed)
Okay to refill? 

## 2012-07-25 NOTE — Telephone Encounter (Signed)
Ok x 1

## 2012-07-27 ENCOUNTER — Encounter: Payer: Self-pay | Admitting: Internal Medicine

## 2012-07-28 ENCOUNTER — Ambulatory Visit: Payer: Self-pay | Admitting: Internal Medicine

## 2012-08-03 ENCOUNTER — Ambulatory Visit (INDEPENDENT_AMBULATORY_CARE_PROVIDER_SITE_OTHER): Payer: Medicare Other | Admitting: Surgery

## 2012-08-03 ENCOUNTER — Encounter (INDEPENDENT_AMBULATORY_CARE_PROVIDER_SITE_OTHER): Payer: Self-pay | Admitting: Surgery

## 2012-08-03 VITALS — BP 160/110 | HR 60 | Temp 97.4°F | Resp 16 | Ht 66.0 in | Wt 165.4 lb

## 2012-08-03 DIAGNOSIS — E210 Primary hyperparathyroidism: Secondary | ICD-10-CM

## 2012-08-03 NOTE — Progress Notes (Signed)
General Surgery [facility]  Chief Complaint  Patient presents with  . New Evaluation    eval hyperparathyroidism - referral from Dr. Dale     HISTORY: Patient is a 67 year old female referred by her primary care physician for suspected primary hyperparathyroidism. Patient had been noted to have elevated serum calcium levels on routine laboratory studies. Calcium level most recently was measured at 10.6. Additional laboratories showed an elevated intact PTH level of 106.4. The patient subsequently underwent a nuclear medicine parathyroid scan on 07/12/2012. This localized a suspected parathyroid adenoma to the left neck posterior to the left thyroid lobe and possibly representing 2 parathyroid glands. Patient is now referred to surgery for consideration for resection.  Patient does have a history of osteoporosis. She notes chronic fatigue. She describes bone and joint pain. She has been treated for depression. She denies nephrolithiasis.  Patient has had a previous anterior cervical fusion. There is no family history of parathyroid disease. There is no family history of other endocrine neoplasm.  Past Medical History  Diagnosis Date  . Hypertension   . Dysrhythmia     PALPITATIONS   . Anxiety   . Depression   . GERD (gastroesophageal reflux disease)   . Hypercholesterolemia   . Chronic insomnia   . COPD (chronic obstructive pulmonary disease)     documented in outside records  . Arthritis   . Heart murmur   . Osteoporosis   . Thyroid disease      Current Outpatient Prescriptions  Medication Sig Dispense Refill  . aspirin EC 81 MG tablet Take 81 mg by mouth daily.      Marland Kitchen atorvastatin (LIPITOR) 40 MG tablet Take 40 mg by mouth daily.      Marland Kitchen buPROPion (WELLBUTRIN) 75 MG tablet Take 75 mg by mouth daily.      . ciprofloxacin (CILOXAN) 0.3 % ophthalmic solution       . docusate sodium (COLACE) 100 MG capsule Take 100 mg by mouth at bedtime.      .  furosemide (LASIX) 20 MG tablet Take 1 tablet (20 mg total) by mouth daily.  30 tablet  1  . metoprolol (LOPRESSOR) 50 MG tablet Take 50 mg by mouth. Take 1/2 tablet twice a day      . Multiple Vitamin (MULTIVITAMIN WITH MINERALS) TABS Take 1 tablet by mouth daily.      . pantoprazole (PROTONIX) 40 MG tablet Take 1 tablet (40 mg total) by mouth daily.  30 tablet  3  . sertraline (ZOLOFT) 100 MG tablet Take 100 mg by mouth daily.      . traMADol (ULTRAM) 50 MG tablet TAKE 1 TABLET BY MOUTH EVERY 6 HOURS AS NEEDED FOR PAIN  60 tablet  0  . valsartan (DIOVAN) 160 MG tablet Take 160 mg by mouth daily.      Marland Kitchen acyclovir (ZOVIRAX) 800 MG tablet       . amLODipine (NORVASC) 5 MG tablet       . ARIPiprazole (ABILIFY) 5 MG tablet Take 5 mg by mouth daily.      . cephALEXin (KEFLEX) 500 MG capsule       . chlorthalidone (HYGROTON) 25 MG tablet       . HYDROcodone-acetaminophen (NORCO) 7.5-325 MG per tablet       . naproxen sodium (ANAPROX) 220 MG tablet Take 220 mg by mouth 2 (two) times daily as needed. For pain      . ondansetron (ZOFRAN) 8 MG tablet       .  traZODone (DESYREL) 50 MG tablet        No current facility-administered medications for this visit.     Allergies  Allergen Reactions  . Tape Itching     Family History  Problem Relation Age of Onset  . Glaucoma Mother   . Hypertension Mother   . Thyroid disease Mother   . Stroke Father   . Hypertension Father      History   Social History  . Marital Status: Married    Spouse Name: N/A    Number of Children: N/A  . Years of Education: N/A   Social History Main Topics  . Smoking status: Former Smoker    Quit date: 02/10/2011  . Smokeless tobacco: Never Used  . Alcohol Use: No  . Drug Use: No  . Sexually Active: None   Other Topics Concern  . None   Social History Narrative  . None     REVIEW OF SYSTEMS - PERTINENT POSITIVES ONLY: Chronic fatigue. Bone and joint pain. Osteoporosis. Denies nephrolithiasis.  History of depression.  EXAM: Filed Vitals:   08/03/12 1108  BP: 160/110  Pulse: 60  Temp: 97.4 F (36.3 C)  Resp: 16    HEENT: normocephalic; pupils equal and reactive; sclerae clear; dentition good; mucous membranes moist NECK:  Well-healed anterior incision right neck; palpation in the thyroid bed shows slight nodularity in the left mid-thyroid lobe; symmetric on extension; no palpable anterior or posterior cervical lymphadenopathy; no supraclavicular masses; no tenderness CHEST: clear to auscultation bilaterally without rales, rhonchi, or wheezes CARDIAC: regular rate and rhythm without significant murmur; peripheral pulses are full EXT:  non-tender without edema; no deformity NEURO: no gross focal deficits; no sign of tremor   LABORATORY RESULTS: See [facility] (CHL-Epic) for most recent results   RADIOLOGY RESULTS: See [facility] (CHL-Epic) for most recent results   IMPRESSION: Primary hyperparathyroidism, likely left parathyroid adenoma  PLAN: I discussed the above findings at length with the patient and her sister. We reviewed her laboratory studies and her radiologic studies. I provided them with written literature on parathyroid disease to review.  I have recommended left minimally invasive parathyroidectomy. We have discussed the procedure at length. We will schedule this on an outpatient basis. We discussed potential complications including recurrent laryngeal nerve injury. Patient and her sister understand and wish to proceed.  The risks and benefits of the procedure have been discussed at length with the patient.  The patient understands the proposed procedure, potential alternative treatments, and the course of recovery to be expected.  All of the patient's questions have been answered at this time.  The patient wishes to proceed with surgery.  Velora Heckler, MD, FACS General & Endocrine Surgery [facility]   Visit Diagnoses: 1.  Hyperparathyroidism, primary   2. Hypercalcemia     Primary Care Physician: Charm Barges, MD

## 2012-08-03 NOTE — Patient Instructions (Signed)

## 2012-08-07 ENCOUNTER — Other Ambulatory Visit: Payer: Self-pay | Admitting: Internal Medicine

## 2012-08-08 ENCOUNTER — Encounter (INDEPENDENT_AMBULATORY_CARE_PROVIDER_SITE_OTHER): Payer: Self-pay

## 2012-08-09 ENCOUNTER — Telehealth: Payer: Self-pay | Admitting: *Deleted

## 2012-08-09 NOTE — Telephone Encounter (Signed)
(  see prior note)-Spoke with pt & her BP reading at home has been: 140/93, 145/103,137/89,140/88. Pt states that she has been taking her BP meds regularly. Please advise

## 2012-08-09 NOTE — Telephone Encounter (Signed)
If she is having problems with her blood pressure and having upcoming surgery - I need to see her.  She can come in tomorrow at 11:45 - work in for this.

## 2012-08-09 NOTE — Telephone Encounter (Signed)
Have her come in Thursday at 11:45.  May have to wait.  Being worked in.

## 2012-08-09 NOTE — Telephone Encounter (Signed)
Pt stated that she can not come in at that time tomorrow because she promised to sit with a pt tomorrow at [facility]. She wants to know if you have anytime on Thursday?

## 2012-08-09 NOTE — Telephone Encounter (Signed)
Patient left a voicemail stating she was having surgery on the 17th. Her appointment with Dr. Lorin Picket was rescheduled and her BP has been running high. She would like someone to call her.

## 2012-08-10 NOTE — Telephone Encounter (Signed)
Appointment made

## 2012-08-10 NOTE — Telephone Encounter (Signed)
Left detailed v/m on home# of appt tomorrow at 11:45.

## 2012-08-10 NOTE — Telephone Encounter (Signed)
Zella Ball can you please put her on the schedule for tomorrow (7/3 @ 11:45 with Dr. Lorin Picket). Thanks.

## 2012-08-11 ENCOUNTER — Encounter: Payer: Self-pay | Admitting: Internal Medicine

## 2012-08-11 ENCOUNTER — Ambulatory Visit (INDEPENDENT_AMBULATORY_CARE_PROVIDER_SITE_OTHER): Payer: Medicare Other | Admitting: Internal Medicine

## 2012-08-11 VITALS — BP 120/82 | HR 60 | Temp 97.8°F | Resp 12 | Wt 163.5 lb

## 2012-08-11 DIAGNOSIS — E7800 Pure hypercholesterolemia, unspecified: Secondary | ICD-10-CM

## 2012-08-11 DIAGNOSIS — R0602 Shortness of breath: Secondary | ICD-10-CM

## 2012-08-11 DIAGNOSIS — F411 Generalized anxiety disorder: Secondary | ICD-10-CM

## 2012-08-11 DIAGNOSIS — F32A Depression, unspecified: Secondary | ICD-10-CM

## 2012-08-11 DIAGNOSIS — I10 Essential (primary) hypertension: Secondary | ICD-10-CM

## 2012-08-11 DIAGNOSIS — F329 Major depressive disorder, single episode, unspecified: Secondary | ICD-10-CM

## 2012-08-11 DIAGNOSIS — F3289 Other specified depressive episodes: Secondary | ICD-10-CM

## 2012-08-11 DIAGNOSIS — F419 Anxiety disorder, unspecified: Secondary | ICD-10-CM

## 2012-08-11 MED ORDER — GABAPENTIN 100 MG PO CAPS: ORAL_CAPSULE | ORAL | Status: DC

## 2012-08-14 ENCOUNTER — Other Ambulatory Visit: Payer: Self-pay | Admitting: Internal Medicine

## 2012-08-14 ENCOUNTER — Encounter: Payer: Self-pay | Admitting: Internal Medicine
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
## 2012-08-15 ENCOUNTER — Ambulatory Visit: Payer: Medicare Other | Admitting: Internal Medicine

## 2012-08-15 NOTE — Assessment & Plan Note (Signed)
Blood pressure on my check today under good control.  I am going to continue the same medication regimen.  Will need metabolic panel and cbc checked prior to her surgery.  rx for labs given to pt to take to pre op.

## 2012-08-15 NOTE — Assessment & Plan Note (Signed)
Seeing Dr Maryruth Bun.  She is following closely.  Appears to be doing better.  Follow.

## 2012-08-15 NOTE — Assessment & Plan Note (Signed)
Increased calcium and increased Intact PTH.  Parathyroid uptake and scan revealed parathyroid adenoma posterior to the left thyroid lobe.  (may be two parathyroid glands adjacent to each other).  See scan report for details.  Has known osteoporosis per her report.  Receiving Reclast.  Continue vitamin D.  Follow.  Stay hydrated.  Referred to Dr Gerrit Friends for further evaluation and treatment.  Planning for parathyroid surgery in the upcoming future.  Will need cardiology to evaluate her prior to her surgery.

## 2012-08-15 NOTE — Assessment & Plan Note (Signed)
Seeing Dr Kapur.  On Wellbutrin and Sertraline now.  Continues f/u with Dr Kapur.  Doing better.    

## 2012-08-15 NOTE — Assessment & Plan Note (Signed)
Low cholesterol diet.  Lipid profile checked 04/28/12 revealed total cholesterol 163, triglycerides 137, HDL 57 and LDL 79.  Follow.

## 2012-08-19 ENCOUNTER — Encounter (HOSPITAL_COMMUNITY): Payer: Self-pay | Admitting: Pharmacy Technician

## 2012-08-22 ENCOUNTER — Encounter (HOSPITAL_COMMUNITY)
Admission: RE | Admit: 2012-08-22 | Discharge: 2012-08-22 | Disposition: A | Discharge status: Home / Self Care | Payer: Medicare Other | Source: Ambulatory Visit | Attending: Surgery | Admitting: Surgery

## 2012-08-22 ENCOUNTER — Encounter (HOSPITAL_COMMUNITY): Payer: Self-pay

## 2012-08-22 HISTORY — DX: Other complications of anesthesia, initial encounter: T88.59XA

## 2012-08-22 HISTORY — DX: Adverse effect of unspecified anesthetic, initial encounter: T41.45XA

## 2012-08-22 HISTORY — DX: Shortness of breath: R06.02

## 2012-08-22 HISTORY — DX: Personal history of other diseases of the digestive system: Z87.19

## 2012-08-22 HISTORY — DX: Vitamin B deficiency, unspecified: E53.9

## 2012-08-22 HISTORY — DX: Paresthesia of skin: R20.2

## 2012-08-22 HISTORY — DX: Primary hyperparathyroidism: E21.0

## 2012-08-22 HISTORY — DX: Personal history of peptic ulcer disease: Z87.11

## 2012-08-22 LAB — CBC
HCT: 42.7 % (ref 36.0–46.0)
MCH: 30.3 pg (ref 26.0–34.0)
MCHC: 33.7 g/dL (ref 30.0–36.0)
MCV: 89.7 fL (ref 78.0–100.0)
RDW: 13.6 % (ref 11.5–15.5)

## 2012-08-22 LAB — BASIC METABOLIC PANEL
BUN: 24 mg/dL — ABNORMAL HIGH (ref 6–23)
Calcium: 11.1 mg/dL — ABNORMAL HIGH (ref 8.4–10.5)
Creatinine, Ser: 0.95 mg/dL (ref 0.50–1.10)
GFR calc Af Amer: 71 mL/min — ABNORMAL LOW (ref >90)
GFR calc non Af Amer: 62 mL/min — ABNORMAL LOW (ref >90)
Glucose, Bld: 101 mg/dL — ABNORMAL HIGH (ref 70–99)

## 2012-08-22 NOTE — Patient Instructions (Addendum)
Parys Elenbaas Page  08/22/2012                           YOUR PROCEDURE IS SCHEDULED ON:08/25/12               PLEASE REPORT TO SHORT STAY CENTER AT : 10:00 AM               CALL THIS NUMBER IF ANY PROBLEMS THE DAY OF SURGERY :               [phone]                      REMEMBER:   Do not eat food or drink liquids AFTER MIDNIGHT  May have clear liquids UNTIL 6 HOURS BEFORE SURGERY (6:30 AM)  Clear liquids include soda, tea, black coffee, apple or grape juice, broth.  Take these medicines the morning of surgery with A SIP OF WATER:  wellbutrin / metoprolol / pantoprazole / zoloft   Do not wear jewelry, make-up   Do not wear lotions, powders, or perfumes.   Do not shave legs or underarms 12 hrs. before surgery (men may shave face)  Do not bring valuables to the hospital.  Contacts, dentures or bridgework may not be worn into surgery.  Leave suitcase in the car. After surgery it may be brought to your room.  For patients admitted to the hospital more than one night, checkout time is 11:00                          The day of discharge.   Patients discharged the day of surgery will not be allowed to drive home                             If going home same day of surgery, must have someone stay with you first                           24 hrs at home and arrange for some one to drive you home from hospital.    Special Instructions:   Please read over the following fact sheets that you were given:               1. DISCONTINUE ALL ASPIRIN AND HERBAL MEDICATIONS 5 DAYS PREOP                       2.Marland Kitchen [place] PREPARING FOR SURGERY SHEET                                                X_____________________________________________________________________        Failure to follow these instructions may result in cancellation of your surgery

## 2012-08-22 NOTE — Progress Notes (Signed)
08/22/12 1428  OBSTRUCTIVE SLEEP APNEA  Have you ever been diagnosed with sleep apnea through a sleep study? No  Do you snore loudly (loud enough to be heard through closed doors)?  1  Do you often feel tired, fatigued, or sleepy during the daytime? 1  Has anyone observed you stop breathing during your sleep? 0  Do you have, or are you being treated for high blood pressure? 1  BMI more than 35 kg/m2? 0  Age over 65 years old? 1  Neck circumference greater than 40 cm/18 inches? 0  Gender: 0  Obstructive Sleep Apnea Score 4  Score 4 or greater  Results sent to PCP

## 2012-08-25 ENCOUNTER — Telehealth (INDEPENDENT_AMBULATORY_CARE_PROVIDER_SITE_OTHER): Payer: Self-pay

## 2012-08-25 ENCOUNTER — Other Ambulatory Visit (INDEPENDENT_AMBULATORY_CARE_PROVIDER_SITE_OTHER): Payer: Self-pay

## 2012-08-25 ENCOUNTER — Ambulatory Visit (HOSPITAL_COMMUNITY)
Admission: RE | Admit: 2012-08-25 | Discharge: 2012-08-25 | Disposition: A | Discharge status: Home / Self Care | Payer: Medicare Other | Source: Ambulatory Visit | Attending: Surgery | Admitting: Surgery

## 2012-08-25 ENCOUNTER — Encounter (HOSPITAL_COMMUNITY)
Admission: RE | Disposition: A | Discharge status: Home / Self Care | Payer: Self-pay | Source: Ambulatory Visit | Attending: Surgery

## 2012-08-25 ENCOUNTER — Ambulatory Visit (HOSPITAL_COMMUNITY): Payer: Medicare Other | Admitting: Anesthesiology

## 2012-08-25 ENCOUNTER — Encounter (HOSPITAL_COMMUNITY): Payer: Self-pay | Admitting: Anesthesiology

## 2012-08-25 ENCOUNTER — Encounter (HOSPITAL_COMMUNITY): Payer: Self-pay | Admitting: *Deleted

## 2012-08-25 DIAGNOSIS — J4489 Other specified chronic obstructive pulmonary disease: Secondary | ICD-10-CM | POA: Insufficient documentation

## 2012-08-25 DIAGNOSIS — I10 Essential (primary) hypertension: Secondary | ICD-10-CM | POA: Insufficient documentation

## 2012-08-25 DIAGNOSIS — E210 Primary hyperparathyroidism: Secondary | ICD-10-CM | POA: Diagnosis present

## 2012-08-25 DIAGNOSIS — J449 Chronic obstructive pulmonary disease, unspecified: Secondary | ICD-10-CM | POA: Insufficient documentation

## 2012-08-25 DIAGNOSIS — E079 Disorder of thyroid, unspecified: Secondary | ICD-10-CM | POA: Insufficient documentation

## 2012-08-25 DIAGNOSIS — Z7982 Long term (current) use of aspirin: Secondary | ICD-10-CM | POA: Insufficient documentation

## 2012-08-25 DIAGNOSIS — I4891 Unspecified atrial fibrillation: Secondary | ICD-10-CM | POA: Insufficient documentation

## 2012-08-25 DIAGNOSIS — K219 Gastro-esophageal reflux disease without esophagitis: Secondary | ICD-10-CM | POA: Insufficient documentation

## 2012-08-25 DIAGNOSIS — Z79899 Other long term (current) drug therapy: Secondary | ICD-10-CM | POA: Insufficient documentation

## 2012-08-25 DIAGNOSIS — M810 Age-related osteoporosis without current pathological fracture: Secondary | ICD-10-CM | POA: Insufficient documentation

## 2012-08-25 DIAGNOSIS — D351 Benign neoplasm of parathyroid gland: Secondary | ICD-10-CM | POA: Insufficient documentation

## 2012-08-25 DIAGNOSIS — Z01812 Encounter for preprocedural laboratory examination: Secondary | ICD-10-CM | POA: Insufficient documentation

## 2012-08-25 DIAGNOSIS — E7800 Pure hypercholesterolemia, unspecified: Secondary | ICD-10-CM | POA: Insufficient documentation

## 2012-08-25 HISTORY — PX: PARATHYROIDECTOMY: SHX19

## 2012-08-25 LAB — BASIC METABOLIC PANEL
CO2: 28 mEq/L (ref 19–32)
Calcium: 11 mg/dL — ABNORMAL HIGH (ref 8.4–10.5)
Creatinine, Ser: 0.96 mg/dL (ref 0.50–1.10)
GFR calc non Af Amer: 61 mL/min — ABNORMAL LOW (ref >90)
Glucose, Bld: 88 mg/dL (ref 70–99)

## 2012-08-25 SURGERY — PARATHYROIDECTOMY
Anesthesia: General | Site: Throat | Laterality: Left | Wound class: Clean

## 2012-08-25 MED ORDER — BUPIVACAINE HCL 0.5 % IJ SOLN
INTRAMUSCULAR | Status: AC
  Filled 2012-08-25: qty 1

## 2012-08-25 MED ORDER — PROPOFOL 10 MG/ML IV BOLUS
INTRAVENOUS | Status: DC | PRN
  Administered 2012-08-25: 140 mg via INTRAVENOUS

## 2012-08-25 MED ORDER — MEPERIDINE HCL 50 MG/ML IJ SOLN: 6.2500 mg | INTRAMUSCULAR | Status: DC | PRN

## 2012-08-25 MED ORDER — HYDRALAZINE HCL 20 MG/ML IJ SOLN
INTRAMUSCULAR | Status: AC
  Administered 2012-08-25: 5 mg
  Filled 2012-08-25: qty 1

## 2012-08-25 MED ORDER — LACTATED RINGERS IV SOLN
INTRAVENOUS | Status: DC
  Administered 2012-08-25: 1000 mL via INTRAVENOUS

## 2012-08-25 MED ORDER — EPHEDRINE SULFATE 50 MG/ML IJ SOLN
INTRAMUSCULAR | Status: DC | PRN
  Administered 2012-08-25 (×2): 5 mg via INTRAVENOUS

## 2012-08-25 MED ORDER — CEFAZOLIN SODIUM-DEXTROSE 2-3 GM-% IV SOLR
2.0000 g | INTRAVENOUS | Status: AC
  Administered 2012-08-25: 2 g via INTRAVENOUS

## 2012-08-25 MED ORDER — LIDOCAINE HCL (PF) 2 % IJ SOLN
INTRAMUSCULAR | Status: DC | PRN
  Administered 2012-08-25: 70 mg

## 2012-08-25 MED ORDER — ROCURONIUM BROMIDE 100 MG/10ML IV SOLN
INTRAVENOUS | Status: DC | PRN
  Administered 2012-08-25: 30 mg via INTRAVENOUS

## 2012-08-25 MED ORDER — BUPIVACAINE HCL (PF) 0.5 % IJ SOLN
INTRAMUSCULAR | Status: DC | PRN
  Administered 2012-08-25: 10 mL

## 2012-08-25 MED ORDER — HYDROMORPHONE HCL PF 1 MG/ML IJ SOLN: 0.2500 mg | INTRAMUSCULAR | Status: DC | PRN

## 2012-08-25 MED ORDER — HYDRALAZINE HCL 20 MG/ML IJ SOLN
5.0000 mg | Freq: Once | INTRAMUSCULAR | Status: AC
  Administered 2012-08-25: 5 mg via INTRAVENOUS

## 2012-08-25 MED ORDER — FENTANYL CITRATE 0.05 MG/ML IJ SOLN
INTRAMUSCULAR | Status: DC | PRN
  Administered 2012-08-25 (×2): 100 ug via INTRAVENOUS
  Administered 2012-08-25: 50 ug via INTRAVENOUS

## 2012-08-25 MED ORDER — ONDANSETRON HCL 4 MG/2ML IJ SOLN
INTRAMUSCULAR | Status: DC | PRN
  Administered 2012-08-25: 4 mg via INTRAVENOUS

## 2012-08-25 MED ORDER — ACETAMINOPHEN 10 MG/ML IV SOLN
1000.0000 mg | Freq: Once | INTRAVENOUS | Status: DC | PRN
  Filled 2012-08-25: qty 100

## 2012-08-25 MED ORDER — CEFAZOLIN SODIUM-DEXTROSE 2-3 GM-% IV SOLR
INTRAVENOUS | Status: AC
  Filled 2012-08-25: qty 50

## 2012-08-25 MED ORDER — NEOSTIGMINE METHYLSULFATE 1 MG/ML IJ SOLN
INTRAMUSCULAR | Status: DC | PRN
  Administered 2012-08-25: 4 mg via INTRAVENOUS

## 2012-08-25 MED ORDER — HYDROCODONE-ACETAMINOPHEN 5-325 MG PO TABS: 1.0000 | ORAL_TABLET | ORAL | Status: DC | PRN

## 2012-08-25 MED ORDER — PROMETHAZINE HCL 25 MG/ML IJ SOLN: 6.2500 mg | INTRAMUSCULAR | Status: DC | PRN

## 2012-08-25 MED ORDER — MIDAZOLAM HCL 5 MG/5ML IJ SOLN
INTRAMUSCULAR | Status: DC | PRN
  Administered 2012-08-25: 1 mg via INTRAVENOUS

## 2012-08-25 MED ORDER — DEXAMETHASONE SODIUM PHOSPHATE 10 MG/ML IJ SOLN
INTRAMUSCULAR | Status: DC | PRN
  Administered 2012-08-25: 10 mg via INTRAVENOUS

## 2012-08-25 MED ORDER — GLYCOPYRROLATE 0.2 MG/ML IJ SOLN
INTRAMUSCULAR | Status: DC | PRN
  Administered 2012-08-25: .6 mg via INTRAVENOUS

## 2012-08-25 MED ORDER — OXYCODONE HCL 5 MG/5ML PO SOLN
5.0000 mg | Freq: Once | ORAL | Status: AC | PRN
  Filled 2012-08-25: qty 5

## 2012-08-25 MED ORDER — OXYCODONE HCL 5 MG PO TABS
5.0000 mg | ORAL_TABLET | Freq: Once | ORAL | Status: AC | PRN
  Administered 2012-08-25: 5 mg via ORAL
  Filled 2012-08-25: qty 1

## 2012-08-25 SURGICAL SUPPLY — 43 items
ATTRACTOMAT 16X20 MAGNETIC DRP (DRAPES) ×2 IMPLANT
BENZOIN TINCTURE PRP APPL 2/3 (GAUZE/BANDAGES/DRESSINGS) ×2 IMPLANT
BLADE HEX COATED 2.75 (ELECTRODE) ×2 IMPLANT
BLADE SURG 15 STRL LF DISP TIS (BLADE) ×1 IMPLANT
BLADE SURG 15 STRL SS (BLADE) ×1
CANISTER SUCTION 2500CC (MISCELLANEOUS) ×2 IMPLANT
CHLORAPREP W/TINT 10.5 ML (MISCELLANEOUS) ×2 IMPLANT
CLIP TI MEDIUM 6 (CLIP) ×4 IMPLANT
CLIP TI WIDE RED SMALL 6 (CLIP) ×4 IMPLANT
CLOSURE STERI-STRIP 1/4X4 (GAUZE/BANDAGES/DRESSINGS) ×2 IMPLANT
CLOTH BEACON ORANGE TIMEOUT ST (SAFETY) ×2 IMPLANT
DISSECTOR ROUND CHERRY 3/8 STR (MISCELLANEOUS) IMPLANT
DRAPE PED LAPAROTOMY (DRAPES) ×2 IMPLANT
DRESSING SURGICEL FIBRLLR 1X2 (HEMOSTASIS) ×1 IMPLANT
DRSG SURGICEL FIBRILLAR 1X2 (HEMOSTASIS) ×2
ELECT REM PT RETURN 9FT ADLT (ELECTROSURGICAL) ×2
ELECTRODE REM PT RTRN 9FT ADLT (ELECTROSURGICAL) ×1 IMPLANT
GAUZE SPONGE 4X4 16PLY XRAY LF (GAUZE/BANDAGES/DRESSINGS) ×2 IMPLANT
GLOVE BIOGEL PI IND STRL 6.5 (GLOVE) ×1 IMPLANT
GLOVE BIOGEL PI INDICATOR 6.5 (GLOVE) ×1
GLOVE SURG ORTHO 8.0 STRL STRW (GLOVE) ×2 IMPLANT
GLOVE SURG SIGNA 7.5 PF LTX (GLOVE) ×2 IMPLANT
GOWN STRL NON-REIN LRG LVL3 (GOWN DISPOSABLE) ×2 IMPLANT
GOWN STRL REIN XL XLG (GOWN DISPOSABLE) ×4 IMPLANT
KIT BASIN OR (CUSTOM PROCEDURE TRAY) ×2 IMPLANT
NEEDLE HYPO 25X1 1.5 SAFETY (NEEDLE) ×2 IMPLANT
NS IRRIG 1000ML POUR BTL (IV SOLUTION) ×2 IMPLANT
PACK BASIC VI WITH GOWN DISP (CUSTOM PROCEDURE TRAY) ×2 IMPLANT
PENCIL BUTTON HOLSTER BLD 10FT (ELECTRODE) ×2 IMPLANT
SPONGE GAUZE 4X4 12PLY (GAUZE/BANDAGES/DRESSINGS) ×2 IMPLANT
STAPLER VISISTAT 35W (STAPLE) ×2 IMPLANT
STRIP CLOSURE SKIN 1/2X4 (GAUZE/BANDAGES/DRESSINGS) ×2 IMPLANT
SUT MNCRL AB 4-0 PS2 18 (SUTURE) ×2 IMPLANT
SUT SILK 2 0 (SUTURE) ×1
SUT SILK 2-0 18XBRD TIE 12 (SUTURE) ×1 IMPLANT
SUT SILK 3 0 (SUTURE)
SUT SILK 3-0 18XBRD TIE 12 (SUTURE) IMPLANT
SUT VIC AB 3-0 SH 18 (SUTURE) ×2 IMPLANT
SYR BULB IRRIGATION 50ML (SYRINGE) ×2 IMPLANT
SYR CONTROL 10ML LL (SYRINGE) ×2 IMPLANT
TAPE CLOTH SURG 4X10 WHT LF (GAUZE/BANDAGES/DRESSINGS) ×2 IMPLANT
TOWEL OR 17X26 10 PK STRL BLUE (TOWEL DISPOSABLE) ×2 IMPLANT
YANKAUER SUCT BULB TIP 10FT TU (MISCELLANEOUS) ×2 IMPLANT

## 2012-08-25 NOTE — Anesthesia Preprocedure Evaluation (Addendum)
Anesthesia Evaluation  Patient identified by MRN, date of birth, ID band Patient awake    Reviewed: Allergy & Precautions, H&P , NPO status , Patient's Chart, lab work & pertinent test results, reviewed documented beta blocker date and time   History of Anesthesia Complications Negative for: history of anesthetic complications  Airway Mallampati: II TM Distance: >3 FB Neck ROM: Limited    Dental  (+) Teeth Intact and Dental Advisory Given   Pulmonary shortness of breath and with exertion, COPDformer smoker,          Cardiovascular hypertension, Pt. on medications and Pt. on home beta blockers + dysrhythmias Atrial Fibrillation + Valvular Problems/Murmurs Rhythm:Regular Rate:Normal + Friction Rub Echo 3/14: Mild pulmonary HTN, mild aortic sclerosis without stensis   Neuro/Psych PSYCHIATRIC DISORDERS Anxiety Depression negative neurological ROS     GI/Hepatic Neg liver ROS, GERD-  Medicated and Controlled,  Endo/Other  negative endocrine ROS  Renal/GU negative Renal ROS     Musculoskeletal negative musculoskeletal ROS (+)   Abdominal   Peds  Hematology negative hematology ROS (+)   Anesthesia Other Findings   Reproductive/Obstetrics negative OB ROS                          Anesthesia Physical  Anesthesia Plan  ASA: II  Anesthesia Plan: General   Post-op Pain Management:    Induction: Intravenous  Airway Management Planned: Oral ETT  Additional Equipment:   Intra-op Plan:   Post-operative Plan: Extubation in OR  Informed Consent:   Dental advisory given  Plan Discussed with: CRNA  Anesthesia Plan Comments:        Anesthesia Quick Evaluation

## 2012-08-25 NOTE — Transfer of Care (Signed)
Immediate Anesthesia Transfer of Care Note  Patient: [name] Page  Procedure(s) Performed: Procedure(s): PARATHYROIDECTOMY (Left)  Patient Location: PACU  Anesthesia Type:General  Level of Consciousness: awake, alert , oriented and patient cooperative  Airway & Oxygen Therapy: Patient Spontanous Breathing and Patient connected to face mask oxygen  Post-op Assessment: Report given to PACU RN and Post -op Vital signs reviewed and stable  Post vital signs: Reviewed and stable  Complications: No apparent anesthesia complications

## 2012-08-25 NOTE — Interval H&P Note (Signed)
History and Physical Interval Note:  08/25/2012 11:38 AM  Pricilla Riffle Page  has presented today for surgery, with the diagnosis of primary hyperparathyroidism.  The various methods of treatment have been discussed with the patient and family. After consideration of risks, benefits and other options for treatment, the patient has consented to    Procedure(s): PARATHYROIDECTOMY (Left) as a surgical intervention .    The patient's history has been reviewed, patient examined, no change in status, stable for surgery.  I have reviewed the patient's chart and labs.  Questions were answered to the patient's satisfaction.    Velora Heckler, MD, [facility] Office: [phone]    Dorian Duval Judie Petit

## 2012-08-25 NOTE — Telephone Encounter (Signed)
Clearance received and to scan.

## 2012-08-25 NOTE — Preoperative (Signed)
Beta Blockers   Reason not to administer Beta Blockers:Took Metoprolol this am. 

## 2012-08-25 NOTE — Progress Notes (Signed)
Spoke with Dr Gerrit Friends on telephone. He states he does not want to evaluate patient before discharge as Dr Rica Mast has already done that. Patient to be discharged.

## 2012-08-25 NOTE — Progress Notes (Addendum)
RN is reevaluating patient's pain after oxycodone IR for surgical pain. Patient states she is having anterior chest discomfort that radiates up her left arm. 12 lead EKG done and patient placed on oxygen 2L/min per [place]. Paged Dr Rica Mast called. He comes over to evaluate patient. Dr Rica Mast compares EKG done to 04/2012 EKG. No changes noted.   1610  Patient OOB to BR. She voids and stated she feels much better after getting up out of bed. States her pain is down to a 4.

## 2012-08-25 NOTE — Op Note (Signed)
OPERATIVE REPORT - PARATHYROIDECTOMY  Preoperative diagnosis: Primary hyperparathyroidism  Postop diagnosis: Same  Procedure: left minimally invasive parathyroidectomy  Surgeon:  Velora Heckler, MD, FACS  Assistant:  Ovidio Kin, MD, FACS  Anesthesia: Gen. endotracheal  Estimated blood loss: Minimal  Preparation: ChloraPrep  Indications: Patient is a 67 yo WF found on routine lab work to have hypercalcemia.  PTH level was elevated.  Nuclear medicine parathyroid scan localized an adenoma to the mid left thyroid bed.  Patient now comes to surgery for parathyroidectomy.  Procedure: Patient was prepared in the holding area. He was brought to operating room and placed in a supine position on the operating room table. Following administration of general anesthesia, the patient was positioned and then prepped and draped in the usual strict aseptic fashion. After ascertaining that an adequate level of anesthesia been achieved, a neck incision is made with a #15 blade. Dissection was carried through subcutaneous tissues and platysma. Hemostasis was obtained with the electrocautery. Skin flaps were developed circumferentially and a Weitlander retractor was placed for exposure.  Strap muscles were incised in the midline. Strap muscles were reflected exposing the thyroid lobe. With gentle blunt dissection the thyroid lobe was mobilized.  Dissection was carried through adipose tissue and an enlarged parathyroid gland is identified. It is gently mobilized. Vascular structures are divided between small and medium ligaclips. Care is taken to avoid the recurrent laryngeal nerve and the esophagus. Gland is completely excised. It is submitted to pathology. Frozen section confirms parathyroid tissue consistent with adenoma.  Neck is irrigated with warm saline. Good hemostasis is noted. Surgicel is placed in the operative field. Strap muscles are reapproximated in the midline with interrupted 3-0 Vicryl sutures.  Platysma was closed with interrupted 3-0 Vicryl sutures. Skin is closed with a running 4-0 Monocryl subcuticular suture. Local anesthetic is infiltrated circumferentially. Wound was washed and dried and benzoin and Steri-Strips are applied. Sterile gauze dressing is applied. Patient is awakened from anesthesia and brought to the recovery room. The patient tolerated the procedure well.   Velora Heckler, MD, FACS General & Endocrine Surgery [facility]

## 2012-08-25 NOTE — H&P (View-Only) (Signed)
General Surgery - [facility]  Chief Complaint  Patient presents with  . New Evaluation    eval hyperparathyroidism - referral from Dr. Charlene Scott    HISTORY: Patient is a 67-year-old female referred by her primary care physician for suspected primary hyperparathyroidism. Patient had been noted to have elevated serum calcium levels on routine laboratory studies. Calcium level most recently was measured at 10.6. Additional laboratories showed an elevated intact PTH level of 106.4. The patient subsequently underwent a nuclear medicine parathyroid scan on 07/12/2012. This localized a suspected parathyroid adenoma to the left neck posterior to the left thyroid lobe and possibly representing 2 parathyroid glands. Patient is now referred to surgery for consideration for resection.  Patient does have a history of osteoporosis. She notes chronic fatigue. She describes bone and joint pain. She has been treated for depression. She denies nephrolithiasis.  Patient has had a previous anterior cervical fusion. There is no family history of parathyroid disease. There is no family history of other endocrine neoplasm.  Past Medical History  Diagnosis Date  . Hypertension   . Dysrhythmia     PALPITATIONS   . Anxiety   . Depression   . GERD (gastroesophageal reflux disease)   . Hypercholesterolemia   . Chronic insomnia   . COPD (chronic obstructive pulmonary disease)     documented in outside records  . Arthritis   . Heart murmur   . Osteoporosis   . Thyroid disease      Current Outpatient Prescriptions  Medication Sig Dispense Refill  . aspirin EC 81 MG tablet Take 81 mg by mouth daily.      . atorvastatin (LIPITOR) 40 MG tablet Take 40 mg by mouth daily.      . buPROPion (WELLBUTRIN) 75 MG tablet Take 75 mg by mouth daily.      . ciprofloxacin (CILOXAN) 0.3 % ophthalmic solution       . docusate sodium (COLACE) 100 MG capsule Take 100 mg by mouth at bedtime.      .  furosemide (LASIX) 20 MG tablet Take 1 tablet (20 mg total) by mouth daily.  30 tablet  1  . metoprolol (LOPRESSOR) 50 MG tablet Take 50 mg by mouth. Take 1/2 tablet twice a day      . Multiple Vitamin (MULTIVITAMIN WITH MINERALS) TABS Take 1 tablet by mouth daily.      . pantoprazole (PROTONIX) 40 MG tablet Take 1 tablet (40 mg total) by mouth daily.  30 tablet  3  . sertraline (ZOLOFT) 100 MG tablet Take 100 mg by mouth daily.      . traMADol (ULTRAM) 50 MG tablet TAKE 1 TABLET BY MOUTH EVERY 6 HOURS AS NEEDED FOR PAIN  60 tablet  0  . valsartan (DIOVAN) 160 MG tablet Take 160 mg by mouth daily.      . acyclovir (ZOVIRAX) 800 MG tablet       . amLODipine (NORVASC) 5 MG tablet       . ARIPiprazole (ABILIFY) 5 MG tablet Take 5 mg by mouth daily.      . cephALEXin (KEFLEX) 500 MG capsule       . chlorthalidone (HYGROTON) 25 MG tablet       . HYDROcodone-acetaminophen (NORCO) 7.5-325 MG per tablet       . naproxen sodium (ANAPROX) 220 MG tablet Take 220 mg by mouth 2 (two) times daily as needed. For pain      . ondansetron (ZOFRAN) 8 MG tablet       .   traZODone (DESYREL) 50 MG tablet        No current facility-administered medications for this visit.     Allergies  Allergen Reactions  . Tape Itching     Family History  Problem Relation Age of Onset  . Glaucoma Mother   . Hypertension Mother   . Thyroid disease Mother   . Stroke Father   . Hypertension Father      History   Social History  . Marital Status: Married    Spouse Name: N/A    Number of Children: N/A  . Years of Education: N/A   Social History Main Topics  . Smoking status: Former Smoker    Quit date: 02/10/2011  . Smokeless tobacco: Never Used  . Alcohol Use: No  . Drug Use: No  . Sexually Active: None   Other Topics Concern  . None   Social History Narrative  . None     REVIEW OF SYSTEMS - PERTINENT POSITIVES ONLY: Chronic fatigue. Bone and joint pain. Osteoporosis. Denies nephrolithiasis.  History of depression.  EXAM: Filed Vitals:   08/03/12 1108  BP: 160/110  Pulse: 60  Temp: 97.4 F (36.3 C)  Resp: 16    HEENT: normocephalic; pupils equal and reactive; sclerae clear; dentition good; mucous membranes moist NECK:  Well-healed anterior incision right neck; palpation in the thyroid bed shows slight nodularity in the left mid-thyroid lobe; symmetric on extension; no palpable anterior or posterior cervical lymphadenopathy; no supraclavicular masses; no tenderness CHEST: clear to auscultation bilaterally without rales, rhonchi, or wheezes CARDIAC: regular rate and rhythm without significant murmur; peripheral pulses are full EXT:  non-tender without edema; no deformity NEURO: no gross focal deficits; no sign of tremor   LABORATORY RESULTS: See [facility] (CHL-Epic) for most recent results   RADIOLOGY RESULTS: See [facility] (CHL-Epic) for most recent results   IMPRESSION: Primary hyperparathyroidism, likely left parathyroid adenoma  PLAN: I discussed the above findings at length with the patient and her sister. We reviewed her laboratory studies and her radiologic studies. I provided them with written literature on parathyroid disease to review.  I have recommended left minimally invasive parathyroidectomy. We have discussed the procedure at length. We will schedule this on an outpatient basis. We discussed potential complications including recurrent laryngeal nerve injury. Patient and her sister understand and wish to proceed.  The risks and benefits of the procedure have been discussed at length with the patient.  The patient understands the proposed procedure, potential alternative treatments, and the course of recovery to be expected.  All of the patient's questions have been answered at this time.  The patient wishes to proceed with surgery.  Braedin Millhouse M. Mellina Benison, MD, FACS General & Endocrine Surgery [facility]   Visit Diagnoses: 1.  Hyperparathyroidism, primary   2. Hypercalcemia     Primary Care Physician: SCOTT,CHARLENE S, MD   

## 2012-08-25 NOTE — Anesthesia Postprocedure Evaluation (Signed)
Anesthesia Post Note  Patient: [name] Page  Procedure(s) Performed: Procedure(s) (LRB): PARATHYROIDECTOMY (Left)  Anesthesia type: General  Patient location: PACU  Post pain: Pain level controlled  Post assessment: Post-op Vital signs reviewed  Last Vitals: BP 146/79  Pulse 68  Temp(Src) 36.8 C (Oral)  Resp 16  SpO2 94%  Post vital signs: Reviewed  Level of consciousness: sedated  Complications: No apparent anesthesia complications

## 2012-08-26 ENCOUNTER — Telehealth (INDEPENDENT_AMBULATORY_CARE_PROVIDER_SITE_OTHER): Payer: Self-pay

## 2012-08-26 ENCOUNTER — Encounter: Payer: Self-pay | Admitting: Internal Medicine

## 2012-08-26 ENCOUNTER — Encounter (HOSPITAL_COMMUNITY): Payer: Self-pay | Admitting: Surgery

## 2012-08-26 NOTE — Telephone Encounter (Signed)
LMOM for pt to call. Pt needs to go to lab on 7-25 to have labs done for appt on 7-29.

## 2012-08-30 ENCOUNTER — Ambulatory Visit (INDEPENDENT_AMBULATORY_CARE_PROVIDER_SITE_OTHER): Payer: Medicare Other | Admitting: Internal Medicine

## 2012-08-30 ENCOUNTER — Encounter: Payer: Self-pay | Admitting: Internal Medicine

## 2012-08-30 VITALS — BP 130/90 | HR 55 | Temp 98.5°F | Ht 65.0 in | Wt 167.5 lb

## 2012-08-30 DIAGNOSIS — M7989 Other specified soft tissue disorders: Secondary | ICD-10-CM

## 2012-08-30 DIAGNOSIS — M79609 Pain in unspecified limb: Secondary | ICD-10-CM

## 2012-08-30 DIAGNOSIS — F3289 Other specified depressive episodes: Secondary | ICD-10-CM

## 2012-08-30 DIAGNOSIS — E210 Primary hyperparathyroidism: Secondary | ICD-10-CM

## 2012-08-30 DIAGNOSIS — E7800 Pure hypercholesterolemia, unspecified: Secondary | ICD-10-CM

## 2012-08-30 DIAGNOSIS — M79605 Pain in left leg: Secondary | ICD-10-CM

## 2012-08-30 DIAGNOSIS — F329 Major depressive disorder, single episode, unspecified: Secondary | ICD-10-CM

## 2012-08-30 DIAGNOSIS — F411 Generalized anxiety disorder: Secondary | ICD-10-CM

## 2012-08-30 DIAGNOSIS — F419 Anxiety disorder, unspecified: Secondary | ICD-10-CM

## 2012-08-30 DIAGNOSIS — F32A Depression, unspecified: Secondary | ICD-10-CM

## 2012-08-30 DIAGNOSIS — E213 Hyperparathyroidism, unspecified: Secondary | ICD-10-CM

## 2012-08-30 DIAGNOSIS — I10 Essential (primary) hypertension: Secondary | ICD-10-CM

## 2012-08-30 LAB — BASIC METABOLIC PANEL
Chloride: 106 mEq/L (ref 96–112)
Creatinine, Ser: 0.7 mg/dL (ref 0.4–1.2)
GFR: 87.69 mL/min (ref >60.00)
Potassium: 3.8 mEq/L (ref 3.5–5.1)

## 2012-08-30 LAB — MAGNESIUM: Magnesium: 1.6 mg/dL (ref 1.5–2.5)

## 2012-08-30 NOTE — Progress Notes (Signed)
Subjective:    Patient ID: [name], female    DOB: [birth year], 67 y.o.   MRN: [id number]  Hypertension  67 year old female with past history of hypercholesterolemia, hypertension, GERD and migraine headaches.  She comes in today for a scheduled follow up.   She is now s/p parathyroid surgery 08/25/12.  Doing well.  Still taking some hydrocodone.  Having some constipation.  Eating and drinking ok.  States she has noticed minimal nausea, but was questioning if this could be related to the pain medication.  Appetite is good.  No vomiting.  Has noticed some increased swelling in her lower extremities (left> right).  Still with some pain in her legs.  Breathing stable.  No cardiac symptoms with increased activity or exertion.  Overall doing relatively well.      Past Medical History  Diagnosis Date  . Hypertension   . Anxiety   . Depression   . GERD (gastroesophageal reflux disease)   . Hypercholesterolemia   . Chronic insomnia   . COPD (chronic obstructive pulmonary disease)     documented in outside records  . Arthritis   . Heart murmur   . Osteoporosis   . Complication of anesthesia     "AFFECTS MY MEMORY"  . Shortness of breath     with exertion  . Tingling     both hands  . History of stomach ulcers   . Hyperparathyroidism, primary   . Burning feet syndrome   . Dysrhythmia     PALPITATIONS     Current Outpatient Prescriptions on File Prior to Visit  Medication Sig Dispense Refill  . aspirin EC 81 MG tablet Take 81 mg by mouth daily.      Marland Kitchen atorvastatin (LIPITOR) 80 MG tablet Take 80 mg by mouth at bedtime.      Marland Kitchen buPROPion (WELLBUTRIN) 75 MG tablet Take 150 mg by mouth 2 (two) times daily.       . cholecalciferol (VITAMIN D) 1000 UNITS tablet Take 2,000 Units by mouth daily.      Marland Kitchen docusate sodium (COLACE) 100 MG capsule Take 100 mg by mouth 3 (three) times daily.       . furosemide (LASIX) 20 MG tablet Take 20 mg by mouth every morning.      . gabapentin (NEURONTIN)  100 MG capsule Take 100-200 mg by mouth at bedtime.      Marland Kitchen HYDROcodone-acetaminophen (NORCO/VICODIN) 5-325 MG per tablet Take 1-2 tablets by mouth every 4 (four) hours as needed for pain.  20 tablet  1  . metoprolol (LOPRESSOR) 50 MG tablet Take 25-50 mg by mouth 2 (two) times daily. Take 1 tablet in the morning, 1/2 tablet in the evening      . pantoprazole (PROTONIX) 40 MG tablet Take 1 tablet (40 mg total) by mouth daily.  30 tablet  3  . sertraline (ZOLOFT) 100 MG tablet Take 150 mg by mouth every morning.       . valsartan (DIOVAN) 160 MG tablet Take 160 mg by mouth every morning.       No current facility-administered medications on file prior to visit.    Review of Systems Patient denies any headache, lightheadedness or dizziness.  No sinus or allergy symptoms currently. No chest pain, tightness or palpitations.   No cough or congestion.  Previously saw Dr Meredeth Ide.  No vomiting.  Minimal nausea as outlined. No abdominal pain or cramping.  No diarrhea, BRBPR or melana.  Some constipation.  See above.  No urine change.   Increased stress and anxiety.  Seeing Dr Maryruth Bun.  Some swelling as outlined.  Let pain as outlined.         Objective:   Physical Exam  Filed Vitals:   08/30/12 1020  BP: 130/90  Pulse: 55  Temp: 98.5 F (36.9 C)   Blood pressure recheck: 138-142/90, pulse 24  67 year old female in no acute distress.   HEENT:  Nares- clear.  Oropharynx - without lesions. NECK:  Supple.  Nontender.  No audible bruit.  HEART:  Appears to be regular. LUNGS:  No crackles or wheezing audible.  Respirations even and unlabored.  RADIAL PULSE:  Equal bilaterally.   ABDOMEN:  Soft, nontender.  Bowel sounds present and normal.  No audible abdominal bruit.    EXTREMITIES:  Pedal edema and lower extremity edema - left>right.    No increased erythema.            Assessment & Plan:  LOWER EXTREMITY EDEMA.  Given recent surgery and given left>right - check lower extremity ultrasound to  confirm no DVT.  If negative, support hose.  Monitor salt intake.        CARDIOVASCULAR.  Recent ECHO revealed mild aortic sclerosis, mild TR and mild elevated pulmonary artery pressure.  She saw cardiology.  Stress test 08/23/12 - negative for ischemia.  Currently stable.   CONSTIPATION.  Feel related to her narcotic pain medication.  See if can decrease the amount.  Enema or suppository if needed.  Follow.  Notify me if persistent problems.        MENOPAUSAL SYNDROME.  Doing well off Prempro.  Follow.   PITUITARY SURGERY.  Obtain records to review more specific information about the type of tumor, etc.  Still do not have.    HEALTH MAINTENANCE.  Physical 05/02/12.   Mammo 07/28/12 - BiRADS II.

## 2012-08-31 ENCOUNTER — Other Ambulatory Visit: Payer: Self-pay | Admitting: Internal Medicine

## 2012-08-31 ENCOUNTER — Encounter (INDEPENDENT_AMBULATORY_CARE_PROVIDER_SITE_OTHER): Payer: Self-pay

## 2012-08-31 MED ORDER — MAGNESIUM OXIDE 400 MG PO TABS: 400.0000 mg | ORAL_TABLET | Freq: Every day | ORAL | Status: DC

## 2012-08-31 NOTE — Progress Notes (Signed)
rx for magoxide 400mg  q day #30 with one refill sent in to [facility]

## 2012-08-31 NOTE — Progress Notes (Signed)
See other lab on desk also

## 2012-09-01 ENCOUNTER — Encounter: Payer: Self-pay | Admitting: Internal Medicine

## 2012-09-01 NOTE — Assessment & Plan Note (Signed)
S/p parathyroid surgery.  Doing well.  Continue to follow up with Dr Gerrit Friends.

## 2012-09-01 NOTE — Assessment & Plan Note (Signed)
S/p parathyroid surgery.  Recheck calcium.  Forward results to Dr Gerrit Friends.

## 2012-09-01 NOTE — Assessment & Plan Note (Signed)
Seeing Dr Kapur.  She is following closely.  Appears to be doing better.  Follow.           

## 2012-09-01 NOTE — Assessment & Plan Note (Signed)
Seeing Dr Kapur.  On Wellbutrin and Sertraline now.  Continues f/u with Dr Kapur.  Doing better.    

## 2012-09-01 NOTE — Assessment & Plan Note (Signed)
Low cholesterol diet.  Lipid profile checked 04/28/12 revealed total cholesterol 163, triglycerides 137, HDL 57 and LDL 79.  Follow.             

## 2012-09-01 NOTE — Assessment & Plan Note (Signed)
Blood pressure elevated today.  Has been under good control  Hold on making changes in her blood pressure medications.  Get her back in soon to reassess.   Monitor salt intake.  She has been off her fluid pill.  Just restarted today.  Follow.  Check metabolic panel.

## 2012-09-06 ENCOUNTER — Ambulatory Visit (INDEPENDENT_AMBULATORY_CARE_PROVIDER_SITE_OTHER): Payer: Medicare Other | Admitting: Surgery

## 2012-09-06 ENCOUNTER — Encounter (INDEPENDENT_AMBULATORY_CARE_PROVIDER_SITE_OTHER): Payer: Self-pay | Admitting: Surgery

## 2012-09-06 VITALS — BP 124/74 | HR 60 | Temp 98.1°F | Resp 14 | Ht 66.5 in | Wt 164.2 lb

## 2012-09-06 DIAGNOSIS — E210 Primary hyperparathyroidism: Secondary | ICD-10-CM

## 2012-09-06 NOTE — Patient Instructions (Signed)
  COCOA BUTTER & VITAMIN E CREAM  ([employer] or other brand)  Apply cocoa butter/vitamin E cream to your incision 2 - 3 times daily.  Massage cream into incision for one minute with each application.  Use sunscreen (50 SPF or higher) for first 6 months after surgery if area is exposed to sun.  You may substitute Mederma or other scar reducing creams as desired.   

## 2012-09-06 NOTE — Progress Notes (Signed)
General Surgery [facility]  Visit Diagnoses: 1. Hyperparathyroidism, primary   2. Hypercalcemia     HISTORY: Patient is a 67 year old female who underwent minimally invasive parathyroidectomy on 08/25/2012. Final path pathology shows a parathyroid adenoma weighing 500 mg and measuring 1.4 cm in length. Serum calcium level is now normal at 9.3.  EXAM: Surgical incision is healing without complication. Mild soft tissue swelling. No sign of infection. No sign of seroma. Voice quality is normal.  IMPRESSION: Status post minimally invasive parathyroidectomy  PLAN: Patient will begin applying topical creams to her incision. We will obtain an intact PTH level and serum calcium level before her next office visit. She will return for a final wound check in 6 weeks.  Velora Heckler, MD, FACS General & Endocrine Surgery [facility]

## 2012-09-20 ENCOUNTER — Encounter: Payer: Self-pay | Admitting: Internal Medicine

## 2012-09-20 ENCOUNTER — Ambulatory Visit (INDEPENDENT_AMBULATORY_CARE_PROVIDER_SITE_OTHER): Payer: Medicare Other | Admitting: Internal Medicine

## 2012-09-20 VITALS — BP 140/100 | HR 58 | Temp 98.1°F | Wt 164.2 lb

## 2012-09-20 DIAGNOSIS — E210 Primary hyperparathyroidism: Secondary | ICD-10-CM

## 2012-09-20 DIAGNOSIS — F329 Major depressive disorder, single episode, unspecified: Secondary | ICD-10-CM

## 2012-09-20 DIAGNOSIS — F5104 Psychophysiologic insomnia: Secondary | ICD-10-CM

## 2012-09-20 DIAGNOSIS — F32A Depression, unspecified: Secondary | ICD-10-CM

## 2012-09-20 DIAGNOSIS — G4700 Insomnia, unspecified: Secondary | ICD-10-CM

## 2012-09-20 DIAGNOSIS — E7800 Pure hypercholesterolemia, unspecified: Secondary | ICD-10-CM

## 2012-09-20 DIAGNOSIS — F411 Generalized anxiety disorder: Secondary | ICD-10-CM

## 2012-09-20 DIAGNOSIS — I10 Essential (primary) hypertension: Secondary | ICD-10-CM

## 2012-09-20 DIAGNOSIS — F3289 Other specified depressive episodes: Secondary | ICD-10-CM

## 2012-09-20 DIAGNOSIS — F419 Anxiety disorder, unspecified: Secondary | ICD-10-CM

## 2012-09-20 MED ORDER — CHLORTHALIDONE 25 MG PO TABS: 25.0000 mg | ORAL_TABLET | Freq: Every day | ORAL | Status: DC

## 2012-09-20 NOTE — Patient Instructions (Signed)
Stop your current fluid pill (furosemide).  Start chlorthalidone 25mg  - one per day.  Also decrease your atenolol to 1/2 tablet two times per day.

## 2012-09-22 ENCOUNTER — Encounter: Payer: Self-pay | Admitting: Internal Medicine

## 2012-09-22 NOTE — Assessment & Plan Note (Signed)
Seeing Dr Kapur.   

## 2012-09-22 NOTE — Assessment & Plan Note (Signed)
Seeing Dr Kapur.  On Wellbutrin and Sertraline now.  Continues f/u with Dr Kapur.  Doing better.    

## 2012-09-22 NOTE — Assessment & Plan Note (Signed)
S/p parathyroid surgery.  Doing well.  Continue to follow up with Dr Gerkin.  Last calcium check wnl.   

## 2012-09-22 NOTE — Progress Notes (Signed)
Subjective:    Patient ID: [name], female    DOB: 1945-06-25, 67 y.o.   MRN: [id number]  Hypertension  67 year old female with past history of hypercholesterolemia, hypertension, GERD and migraine headaches.  She comes in today for a scheduled follow up.   She is now s/p parathyroid surgery 08/25/12.  Doing well.  Eating and drinking ok.  Appetite is good.  No vomiting.  Feels some better.  Swelling improved/resolved.   Still with some pain in her legs.  Breathing stable.  No cardiac symptoms with increased activity or exertion.  Overall doing relatively well.  Still seeing Dr Maryruth Bun.  Stable.     Past Medical History  Diagnosis Date  . Hypertension   . Anxiety   . Depression   . GERD (gastroesophageal reflux disease)   . Hypercholesterolemia   . Chronic insomnia   . COPD (chronic obstructive pulmonary disease)     documented in outside records  . Arthritis   . Heart murmur   . Osteoporosis   . Complication of anesthesia     "AFFECTS MY MEMORY"  . Shortness of breath     with exertion  . Tingling     both hands  . History of stomach ulcers   . Hyperparathyroidism, primary   . Burning feet syndrome   . Dysrhythmia     PALPITATIONS     Current Outpatient Prescriptions on File Prior to Visit  Medication Sig Dispense Refill  . aspirin EC 81 MG tablet Take 81 mg by mouth daily.      Marland Kitchen atorvastatin (LIPITOR) 80 MG tablet Take 80 mg by mouth at bedtime.      Marland Kitchen buPROPion (WELLBUTRIN) 75 MG tablet Take 150 mg by mouth 2 (two) times daily.       . cholecalciferol (VITAMIN D) 1000 UNITS tablet Take 2,000 Units by mouth daily.      Marland Kitchen docusate sodium (COLACE) 100 MG capsule Take 100 mg by mouth 3 (three) times daily.       Marland Kitchen gabapentin (NEURONTIN) 100 MG capsule Take 100-200 mg by mouth at bedtime.      Marland Kitchen HYDROcodone-acetaminophen (NORCO/VICODIN) 5-325 MG per tablet Take 1-2 tablets by mouth every 4 (four) hours as needed for pain.  20 tablet  1  . magnesium oxide (MAG-OX) 400 MG  tablet Take 1 tablet (400 mg total) by mouth daily.  30 tablet  1  . metoprolol (LOPRESSOR) 50 MG tablet Take 25-50 mg by mouth 2 (two) times daily. Take 1 tablet in the morning, 1/2 tablet in the evening      . pantoprazole (PROTONIX) 40 MG tablet Take 1 tablet (40 mg total) by mouth daily.  30 tablet  3  . sertraline (ZOLOFT) 100 MG tablet Take 150 mg by mouth every morning.       . valsartan (DIOVAN) 160 MG tablet Take 160 mg by mouth every morning.       No current facility-administered medications on file prior to visit.    Review of Systems Patient denies any headache, lightheadedness or dizziness.  No sinus or allergy symptoms currently. No chest pain, tightness or palpitations.   No cough or congestion.  Previously saw Dr Meredeth Ide.  No nausea or vomiting.  No abdominal pain or cramping.  No diarrhea, BRBPR or melana.  No urine change.   Increased stress and anxiety.  Seeing Dr Maryruth Bun.  Swelling improved.        Objective:   Physical Exam  Filed Vitals:   09/20/12 1547  BP: 140/100  Pulse: 58  Temp: 98.1 F (36.7 C)   Blood pressure recheck: 138-140/90, pulse 57  67 year old female in no acute distress.   HEENT:  Nares- clear.  Oropharynx - without lesions. NECK:  Supple.  Nontender.  No audible bruit.  Well healed incision site.  HEART:  Appears to be regular. LUNGS:  No crackles or wheezing audible.  Respirations even and unlabored.  RADIAL PULSE:  Equal bilaterally.   ABDOMEN:  Soft, nontender.  Bowel sounds present and normal.  No audible abdominal bruit.    EXTREMITIES:  No increased edema.   No increased erythema.            Assessment & Plan:  LOWER EXTREMITY EDEMA.  Improved/resolved.  Follow.        CARDIOVASCULAR.  Recent ECHO revealed mild aortic sclerosis, mild TR and mild elevated pulmonary artery pressure.  She saw cardiology.  Stress test 08/23/12 - negative for ischemia.  Currently stable.       MENOPAUSAL SYNDROME.  Doing well off Prempro.  Follow.    PITUITARY SURGERY.  Obtain records to review more specific information about the type of tumor, etc.  Still do not have.    HEALTH MAINTENANCE.  Physical 05/02/12.   Mammo 07/28/12 - BiRADS II.

## 2012-09-22 NOTE — Assessment & Plan Note (Signed)
Low cholesterol diet.  Lipid profile checked 04/28/12 revealed total cholesterol 163, triglycerides 137, HDL 57 and LDL 79.  Follow.             

## 2012-09-22 NOTE — Assessment & Plan Note (Signed)
Seeing Dr Kapur.  She is following closely.  Appears to be doing better.  Follow.           

## 2012-09-22 NOTE — Assessment & Plan Note (Signed)
Blood pressure was previously better controlled on chlorthalidone.  This was stopped secondary to the increased calcium.  Calcium normal after parathyroid surgery.  Will stop the lasix.  Start chlorthalidone 25mg  q day.  Decrease lopressor to 1/2 tablet bid.  Follow pressures.  Get her back in soon to reassess.  Check metabolic panel.

## 2012-10-18 ENCOUNTER — Other Ambulatory Visit (INDEPENDENT_AMBULATORY_CARE_PROVIDER_SITE_OTHER): Payer: Medicare Other

## 2012-10-18 ENCOUNTER — Encounter (INDEPENDENT_AMBULATORY_CARE_PROVIDER_SITE_OTHER): Payer: Self-pay

## 2012-10-18 DIAGNOSIS — I10 Essential (primary) hypertension: Secondary | ICD-10-CM

## 2012-10-18 DIAGNOSIS — E210 Primary hyperparathyroidism: Secondary | ICD-10-CM

## 2012-10-18 LAB — BASIC METABOLIC PANEL
BUN: 27 mg/dL — ABNORMAL HIGH (ref 6–23)
Calcium: 9.6 mg/dL (ref 8.4–10.5)
Creatinine, Ser: 1 mg/dL (ref 0.4–1.2)
GFR: 59.72 mL/min — ABNORMAL LOW (ref >60.00)

## 2012-10-19 LAB — PTH, INTACT AND CALCIUM
Calcium: 9.8 mg/dL (ref 8.4–10.5)
PTH: 74.8 pg/mL — ABNORMAL HIGH (ref 14.0–72.0)

## 2012-10-19 LAB — CALCIUM: Calcium: 9.8 mg/dL (ref 8.4–10.5)

## 2012-10-20 ENCOUNTER — Other Ambulatory Visit: Payer: Self-pay | Admitting: *Deleted

## 2012-10-20 MED ORDER — POTASSIUM CHLORIDE ER 10 MEQ PO TBCR: EXTENDED_RELEASE_TABLET | ORAL | Status: DC

## 2012-10-24 ENCOUNTER — Ambulatory Visit (INDEPENDENT_AMBULATORY_CARE_PROVIDER_SITE_OTHER): Payer: Medicare Other | Admitting: Internal Medicine

## 2012-10-24 ENCOUNTER — Encounter: Payer: Self-pay | Admitting: Internal Medicine

## 2012-10-24 VITALS — BP 118/90 | HR 51 | Temp 98.0°F | Ht 65.0 in | Wt 164.0 lb

## 2012-10-24 DIAGNOSIS — F329 Major depressive disorder, single episode, unspecified: Secondary | ICD-10-CM

## 2012-10-24 DIAGNOSIS — Z23 Encounter for immunization: Secondary | ICD-10-CM

## 2012-10-24 DIAGNOSIS — F32A Depression, unspecified: Secondary | ICD-10-CM

## 2012-10-24 DIAGNOSIS — F411 Generalized anxiety disorder: Secondary | ICD-10-CM

## 2012-10-24 DIAGNOSIS — I10 Essential (primary) hypertension: Secondary | ICD-10-CM

## 2012-10-24 DIAGNOSIS — E876 Hypokalemia: Secondary | ICD-10-CM

## 2012-10-24 DIAGNOSIS — F3289 Other specified depressive episodes: Secondary | ICD-10-CM

## 2012-10-24 DIAGNOSIS — G4700 Insomnia, unspecified: Secondary | ICD-10-CM

## 2012-10-24 DIAGNOSIS — E7800 Pure hypercholesterolemia, unspecified: Secondary | ICD-10-CM

## 2012-10-24 DIAGNOSIS — E210 Primary hyperparathyroidism: Secondary | ICD-10-CM

## 2012-10-24 DIAGNOSIS — F5104 Psychophysiologic insomnia: Secondary | ICD-10-CM

## 2012-10-24 DIAGNOSIS — F419 Anxiety disorder, unspecified: Secondary | ICD-10-CM

## 2012-10-25 ENCOUNTER — Encounter: Payer: Self-pay | Admitting: *Deleted

## 2012-10-25 LAB — BASIC METABOLIC PANEL
Calcium: 9.6 mg/dL (ref 8.4–10.5)
Creatinine, Ser: 1 mg/dL (ref 0.4–1.2)
GFR: 62.63 mL/min (ref >60.00)
Sodium: 140 mEq/L (ref 135–145)

## 2012-10-26 ENCOUNTER — Encounter: Payer: Self-pay | Admitting: Internal Medicine

## 2012-10-26 NOTE — Assessment & Plan Note (Signed)
Seeing Dr Kapur.   

## 2012-10-26 NOTE — Assessment & Plan Note (Signed)
S/p parathyroid surgery.  Doing well.  Continue to follow up with Dr Gerkin.  Last calcium check wnl.   

## 2012-10-26 NOTE — Progress Notes (Signed)
Subjective:    Patient ID: [name], female    DOB: 04/24/1945, 67 y.o.   MRN: [id number]  Hypertension  67 year old female with past history of hypercholesterolemia, hypertension, GERD and migraine headaches.  She comes in today for a scheduled follow up.   She is now s/p parathyroid surgery 08/25/12.  Doing well.  Eating and drinking ok.  Appetite is good.  No vomiting.  Feels better.  Swelling improved/resolved.   Breathing stable.  No cardiac symptoms with increased activity or exertion.  Overall doing relatively well.  Still seeing Dr Maryruth Bun.  Stable.  Energy better.  Starting to be more active.    Past Medical History  Diagnosis Date  . Hypertension   . Anxiety   . Depression   . GERD (gastroesophageal reflux disease)   . Hypercholesterolemia   . Chronic insomnia   . COPD (chronic obstructive pulmonary disease)     documented in outside records  . Arthritis   . Heart murmur   . Osteoporosis   . Complication of anesthesia     "AFFECTS MY MEMORY"  . Shortness of breath     with exertion  . Tingling     both Weaver  . History of stomach ulcers   . Hyperparathyroidism, primary   . Burning feet syndrome   . Dysrhythmia     PALPITATIONS     Current Outpatient Prescriptions on File Prior to Visit  Medication Sig Dispense Refill  . aspirin EC 81 MG tablet Take 81 mg by mouth daily.      Marland Kitchen atorvastatin (LIPITOR) 80 MG tablet Take 80 mg by mouth at bedtime.      Marland Kitchen buPROPion (WELLBUTRIN) 75 MG tablet Take 150 mg by mouth 2 (two) times daily.       . chlorthalidone (HYGROTON) 25 MG tablet Take 1 tablet (25 mg total) by mouth daily.  30 tablet  2  . cholecalciferol (VITAMIN D) 1000 UNITS tablet Take 2,000 Units by mouth daily.      Marland Kitchen docusate sodium (COLACE) 100 MG capsule Take 100 mg by mouth 3 (three) times daily.       Marland Kitchen gabapentin (NEURONTIN) 100 MG capsule Take 100-200 mg by mouth at bedtime.      Marland Kitchen HYDROcodone-acetaminophen (NORCO/VICODIN) 5-325 MG per tablet Take 1-2  tablets by mouth every 4 (four) hours as needed for pain.  20 tablet  1  . magnesium oxide (MAG-OX) 400 MG tablet Take 1 tablet (400 mg total) by mouth daily.  30 tablet  1  . metoprolol (LOPRESSOR) 50 MG tablet Take 25-50 mg by mouth 2 (two) times daily. Take 1/2 tablet in the morning, 1/2 tablet in the evening      . pantoprazole (PROTONIX) 40 MG tablet Take 1 tablet (40 mg total) by mouth daily.  30 tablet  3  . potassium chloride (K-DUR) 10 MEQ tablet Take one twice a day x 2 days, then once daily  32 tablet  0  . sertraline (ZOLOFT) 100 MG tablet Take 150 mg by mouth every morning.       . valsartan (DIOVAN) 160 MG tablet Take 160 mg by mouth every morning.       No current facility-administered medications on file prior to visit.    Review of Systems Patient denies any headache, lightheadedness or dizziness.  No sinus or allergy symptoms currently. No chest pain, tightness or palpitations.   No cough or congestion.  Previously saw Dr Meredeth Ide.  No nausea  or vomiting.  No abdominal pain or cramping.  No diarrhea, BRBPR or melana.  No urine change.   Increased stress and anxiety.  Seeing Dr Maryruth Bun.  Swelling resolved.         Objective:   Physical Exam  Filed Vitals:   10/24/12 1437  BP: 118/90  Pulse: 51  Temp: 98 F (36.7 C)   Blood pressure recheck: 19-35/80  67 year old female in no acute distress.   HEENT:  Nares- clear.  Oropharynx - without lesions. NECK:  Supple.  Nontender.  No audible bruit.  Well healed incision site.  HEART:  Appears to be regular. LUNGS:  No crackles or wheezing audible.  Respirations even and unlabored.  RADIAL PULSE:  Equal bilaterally.   ABDOMEN:  Soft, nontender.  Bowel sounds present and normal.  No audible abdominal bruit.    EXTREMITIES:  No increased edema.   No increased erythema.            Assessment & Plan:  LOWER EXTREMITY EDEMA.  Resolved.         CARDIOVASCULAR.  Recent ECHO revealed mild aortic sclerosis, mild TR and mild  elevated pulmonary artery pressure.  She saw cardiology.  Stress test 08/23/12 - negative for ischemia.  Currently stable.       MENOPAUSAL SYNDROME.  Doing well off Prempro.  Follow.   PITUITARY SURGERY.  Obtain records to review more specific information about the type of tumor, etc.  Still do not have.    HEALTH MAINTENANCE.  Physical 05/02/12.   Mammo 07/28/12 - BiRADS II.

## 2012-10-26 NOTE — Assessment & Plan Note (Signed)
Seeing Dr Kapur.  On Wellbutrin and Sertraline now.  Continues f/u with Dr Kapur.  Doing better.    

## 2012-10-26 NOTE — Assessment & Plan Note (Signed)
Resolved s/p parathyroid surgery.

## 2012-10-26 NOTE — Assessment & Plan Note (Signed)
Low cholesterol diet.  Lipid profile checked 04/28/12 revealed total cholesterol 163, triglycerides 137, HDL 57 and LDL 79.  Follow.             

## 2012-10-26 NOTE — Assessment & Plan Note (Signed)
Blood pressure is doing better.  Still slight increase.  Feels better.  Plans to get more active.  Follow.  On chlorthalidone.  Now on potassium supplements.  Recheck potassium today.  Continue same medication regimen.

## 2012-10-26 NOTE — Assessment & Plan Note (Signed)
Seeing Dr Kapur.  She is following closely.  Doing better.  Follow.   

## 2012-10-30 ENCOUNTER — Other Ambulatory Visit: Payer: Self-pay | Admitting: Internal Medicine

## 2012-11-01 ENCOUNTER — Ambulatory Visit (INDEPENDENT_AMBULATORY_CARE_PROVIDER_SITE_OTHER): Payer: Medicare Other | Admitting: Surgery

## 2012-11-01 ENCOUNTER — Encounter (INDEPENDENT_AMBULATORY_CARE_PROVIDER_SITE_OTHER): Payer: Self-pay | Admitting: Surgery

## 2012-11-01 VITALS — BP 124/78 | HR 60 | Temp 97.8°F | Resp 18 | Ht 66.5 in | Wt 167.2 lb

## 2012-11-01 DIAGNOSIS — E210 Primary hyperparathyroidism: Secondary | ICD-10-CM

## 2012-11-01 NOTE — Patient Instructions (Signed)
  COCOA BUTTER & VITAMIN E CREAM  ([employer] or other brand)  Apply cocoa butter/vitamin E cream to your incision 2 - 3 times daily.  Massage cream into incision for one minute with each application.  Use sunscreen (50 SPF or higher) for first 6 months after surgery if area is exposed to sun.  You may substitute Mederma or other scar reducing creams as desired.   

## 2012-11-01 NOTE — Progress Notes (Signed)
General Surgery [facility]  Chief Complaint  Patient presents with  . Routine Post Op    parathyroidectomy 08/25/2012    HISTORY: Patient is a 67 year old female who underwent minimally invasive parathyroidectomy in July 2014. She returns for her final wound check. Laboratory studies from earlier this month showing normal serum calcium level of 9.8 and 9.6. An intact PTH level is nearly normal at 74.8, down from her preoperative level of 106.4.  EXAM: Surgical wound is well healed. Good cosmetic result. No sign of infection. No sign of seroma. Voice quality is normal.  IMPRESSION: Status post minimally invasive parathyroidectomy with normalization of serum calcium level  PLAN: Patient will continue to apply topical creams to her incision. Her primary care physician, Dr. Dale [place], will check a serum calcium level periodically. No further follow-up is required.  Patient will return for surgical care as needed.  Velora Heckler, MD, FACS General & Endocrine Surgery [facility]   Visit Diagnoses: 1. Hyperparathyroidism, primary

## 2012-11-14 ENCOUNTER — Other Ambulatory Visit: Payer: Self-pay | Admitting: Internal Medicine

## 2012-11-15 ENCOUNTER — Other Ambulatory Visit: Payer: Self-pay | Admitting: Internal Medicine

## 2012-11-30 ENCOUNTER — Other Ambulatory Visit: Payer: Self-pay | Admitting: Internal Medicine

## 2012-12-01 NOTE — Telephone Encounter (Signed)
Refill

## 2012-12-01 NOTE — Telephone Encounter (Signed)
Refilled neurontin #[id number] with one refill

## 2012-12-05 ENCOUNTER — Ambulatory Visit: Payer: Medicare Other | Admitting: Internal Medicine

## 2012-12-15 ENCOUNTER — Other Ambulatory Visit: Payer: Self-pay | Admitting: Internal Medicine

## 2012-12-20 ENCOUNTER — Encounter: Payer: Self-pay | Admitting: Internal Medicine

## 2012-12-20 ENCOUNTER — Ambulatory Visit (INDEPENDENT_AMBULATORY_CARE_PROVIDER_SITE_OTHER): Payer: Medicare Other | Admitting: Internal Medicine

## 2012-12-20 VITALS — BP 110/80 | HR 56 | Temp 98.0°F | Ht 66.5 in | Wt 169.8 lb

## 2012-12-20 DIAGNOSIS — F411 Generalized anxiety disorder: Secondary | ICD-10-CM

## 2012-12-20 DIAGNOSIS — F3289 Other specified depressive episodes: Secondary | ICD-10-CM

## 2012-12-20 DIAGNOSIS — F419 Anxiety disorder, unspecified: Secondary | ICD-10-CM

## 2012-12-20 DIAGNOSIS — R040 Epistaxis: Secondary | ICD-10-CM

## 2012-12-20 DIAGNOSIS — F32A Depression, unspecified: Secondary | ICD-10-CM

## 2012-12-20 DIAGNOSIS — G4700 Insomnia, unspecified: Secondary | ICD-10-CM

## 2012-12-20 DIAGNOSIS — F5104 Psychophysiologic insomnia: Secondary | ICD-10-CM

## 2012-12-20 DIAGNOSIS — I10 Essential (primary) hypertension: Secondary | ICD-10-CM

## 2012-12-20 DIAGNOSIS — E7800 Pure hypercholesterolemia, unspecified: Secondary | ICD-10-CM

## 2012-12-20 DIAGNOSIS — E210 Primary hyperparathyroidism: Secondary | ICD-10-CM

## 2012-12-20 DIAGNOSIS — F329 Major depressive disorder, single episode, unspecified: Secondary | ICD-10-CM

## 2012-12-20 NOTE — Progress Notes (Signed)
Subjective:    Patient ID: [name], female    DOB: March 15, 1945, 67 y.o.   MRN: [id number]  Hypertension  67 year old female with past history of hypercholesterolemia, hypertension, GERD and migraine headaches.  She comes in today for a scheduled follow up.   She is now s/p parathyroid surgery 08/25/12.  Doing well.  Eating and drinking ok.  Appetite is good.  No vomiting.  Feels better.  Swelling improved/resolved.   Breathing stable.  No cardiac symptoms with increased activity or exertion.  Overall doing relatively well.  Still seeing Dr Maryruth Bun.  Stable.  She does report some increased fatigue.  Was questioning whether or not this could be coming from some of her medication.  Has had some reoccurring nose bleeds.     Past Medical History  Diagnosis Date  . Hypertension   . Anxiety   . Depression   . GERD (gastroesophageal reflux disease)   . Hypercholesterolemia   . Chronic insomnia   . COPD (chronic obstructive pulmonary disease)     documented in outside records  . Arthritis   . Heart murmur   . Osteoporosis   . Complication of anesthesia     "AFFECTS MY MEMORY"  . Shortness of breath     with exertion  . Tingling     both hands  . History of stomach ulcers   . Hyperparathyroidism, primary   . Burning feet syndrome   . Dysrhythmia     PALPITATIONS     Current Outpatient Prescriptions on File Prior to Visit  Medication Sig Dispense Refill  . aspirin EC 81 MG tablet Take 81 mg by mouth daily.      Marland Kitchen atorvastatin (LIPITOR) 80 MG tablet TAKE 1 TABLET (80 MG TOTAL) BY MOUTH DAILY.  30 tablet  5  . buPROPion (WELLBUTRIN) 75 MG tablet Take 150 mg by mouth 2 (two) times daily.       . chlorthalidone (HYGROTON) 25 MG tablet Take 1 tablet (25 mg total) by mouth daily.  30 tablet  2  . cholecalciferol (VITAMIN D) 1000 UNITS tablet Take 2,000 Units by mouth daily.      Marland Kitchen docusate sodium (COLACE) 100 MG capsule Take 100 mg by mouth 3 (three) times daily.       Marland Kitchen gabapentin  (NEURONTIN) 100 MG capsule TAKE 1-2 TABLETS AT BEDTIME AS NEEDED. PLEASE STOP TRAMADOL  60 capsule  1  . HYDROcodone-acetaminophen (NORCO/VICODIN) 5-325 MG per tablet Take 1-2 tablets by mouth every 4 (four) hours as needed for pain.  20 tablet  1  . Magnesium Oxide 400 (240 MG) MG TABS TAKE 1 TABLET (400 MG TOTAL) BY MOUTH DAILY.  30 tablet  1  . metoprolol (LOPRESSOR) 50 MG tablet Take 25-50 mg by mouth 2 (two) times daily. Take 1/2 tablet in the morning, 1/2 tablet in the evening      . pantoprazole (PROTONIX) 40 MG tablet TAKE 1 TABLET (40 MG TOTAL) BY MOUTH DAILY.  30 tablet  5  . potassium chloride (K-DUR) 10 MEQ tablet Take 1 tablet (10 mEq total) by mouth daily.  30 tablet  5  . sertraline (ZOLOFT) 100 MG tablet Take 150 mg by mouth every morning.       . valsartan (DIOVAN) 160 MG tablet Take 160 mg by mouth every morning.       No current facility-administered medications on file prior to visit.    Review of Systems Patient denies any headache, lightheadedness or dizziness.  No sinus or allergy symptoms currently.  Some reoccurring nose bleeds.  No chest pain, tightness or palpitations.   No cough or congestion.  Previously saw Dr Meredeth Ide.  No nausea or vomiting.  No abdominal pain or cramping.  No diarrhea, BRBPR or melana.  No urine change.   Increased stress and anxiety.  Seeing Dr Maryruth Bun.  Swelling resolved.   Some increased fatigue.         Objective:   Physical Exam  Filed Vitals:   12/20/12 1436  BP: 110/80  Pulse: 56  Temp: 98 F (36.7 C)   Blood pressure recheck: 46-70/17  66 year old female in no acute distress.   HEENT:  Nares- clear.  Oropharynx - without lesions. NECK:  Supple.  Nontender.  No audible bruit.  Well healed incision site.  HEART:  Appears to be regular. LUNGS:  No crackles or wheezing audible.  Respirations even and unlabored.  RADIAL PULSE:  Equal bilaterally.   ABDOMEN:  Soft, nontender.  Bowel sounds present and normal.  No audible abdominal  bruit.    EXTREMITIES:  No increased edema.   No increased erythema.            Assessment & Plan:  LOWER EXTREMITY EDEMA.  Resolved.         CARDIOVASCULAR.  Recent ECHO revealed mild aortic sclerosis, mild TR and mild elevated pulmonary artery pressure.  She saw cardiology.  Stress test 08/23/12 - negative for ischemia.  Currently stable.       MENOPAUSAL SYNDROME.  Doing well off Prempro.  Follow.   PITUITARY SURGERY.  Obtain records to review more specific information about the type of tumor, etc.  Still do not have.    HEALTH MAINTENANCE.  Physical 05/02/12.   Mammo 07/28/12 - BiRADS II.

## 2012-12-20 NOTE — Progress Notes (Signed)
Pre-visit discussion using our clinic review tool. No additional management support is needed unless otherwise documented below in the visit note.  

## 2012-12-24 ENCOUNTER — Encounter: Payer: Self-pay | Admitting: Internal Medicine

## 2012-12-24 ENCOUNTER — Other Ambulatory Visit: Payer: Self-pay | Admitting: Internal Medicine

## 2012-12-24 NOTE — Assessment & Plan Note (Signed)
Seeing Dr Kapur.  She is following closely.  Doing better.  Follow.   

## 2012-12-24 NOTE — Assessment & Plan Note (Signed)
Resolved s/p parathyroid surgery.  Will recheck today.

## 2012-12-24 NOTE — Assessment & Plan Note (Signed)
Seeing Dr Kapur.   

## 2012-12-24 NOTE — Assessment & Plan Note (Signed)
S/p parathyroid surgery.  Doing well.  Continue to follow up with Dr Gerkin.  Last calcium check wnl.   

## 2012-12-24 NOTE — Assessment & Plan Note (Signed)
Seeing Dr Kapur.  On Wellbutrin and Sertraline now.  Continues f/u with Dr Kapur.  Doing better.    

## 2012-12-24 NOTE — Assessment & Plan Note (Addendum)
Blood pressure is doing better.  Does report some fatigue.  Will decrease her metoprolol to 1/2 tablet q day.  Follow pressures.  See if fatigue improves with this change.  Follow.  Get her back in soon to reassess.   Check metabolic panel.

## 2012-12-24 NOTE — Assessment & Plan Note (Signed)
Low cholesterol diet.  Lipid profile checked 04/28/12 revealed total cholesterol 163, triglycerides 137, HDL 57 and LDL 79.  Follow.

## 2012-12-24 NOTE — Assessment & Plan Note (Signed)
Has had reoccurring nose bleeds.  Can refer to ENT for evaluation.

## 2012-12-29 ENCOUNTER — Emergency Department: Payer: Self-pay | Admitting: Emergency Medicine

## 2012-12-29 LAB — COMPREHENSIVE METABOLIC PANEL
Alkaline Phosphatase: 133 U/L (ref 50–136)
BUN: 25 mg/dL — ABNORMAL HIGH (ref 7–18)
Bilirubin,Total: 0.4 mg/dL (ref 0.2–1.0)
Calcium, Total: 8.8 mg/dL (ref 8.5–10.1)
Chloride: 107 mmol/L (ref 98–107)
Co2: 27 mmol/L (ref 21–32)
Creatinine: 0.96 mg/dL (ref 0.60–1.30)
EGFR (African American): >60
Glucose: 117 mg/dL — ABNORMAL HIGH (ref 65–99)
Osmolality: 285 (ref 275–301)
Potassium: 3.4 mmol/L — ABNORMAL LOW (ref 3.5–5.1)
SGPT (ALT): 19 U/L (ref 12–78)
Sodium: 140 mmol/L (ref 136–145)

## 2012-12-29 LAB — PROTIME-INR
INR: 1
Prothrombin Time: 13.1 secs (ref 11.5–14.7)

## 2012-12-29 LAB — CBC WITH DIFFERENTIAL/PLATELET
Basophil #: 0.2 10*3/uL — ABNORMAL HIGH (ref 0.0–0.1)
Eosinophil #: 0.2 10*3/uL (ref 0.0–0.7)
Eosinophil %: 2.8 %
HCT: 32.4 % — ABNORMAL LOW (ref 35.0–47.0)
MCH: 32.7 pg (ref 26.0–34.0)
MCHC: 34.2 g/dL (ref 32.0–36.0)
MCV: 96 fL (ref 80–100)
Monocyte #: 1.1 x10 3/mm — ABNORMAL HIGH (ref 0.2–0.9)
Monocyte %: 12.4 %
Neutrophil #: 4.8 10*3/uL (ref 1.4–6.5)
Neutrophil %: 55.3 %
Platelet: 376 10*3/uL (ref 150–440)
RDW: 14.2 % (ref 11.5–14.5)
WBC: 8.7 10*3/uL (ref 3.6–11.0)

## 2013-01-13 ENCOUNTER — Other Ambulatory Visit: Payer: Self-pay | Admitting: Internal Medicine

## 2013-01-23 ENCOUNTER — Ambulatory Visit: Payer: Medicare Other | Admitting: Internal Medicine

## 2013-02-21 ENCOUNTER — Other Ambulatory Visit: Payer: Self-pay | Admitting: Internal Medicine

## 2013-04-05 ENCOUNTER — Other Ambulatory Visit: Payer: Self-pay | Admitting: *Deleted

## 2013-04-05 MED ORDER — PANTOPRAZOLE SODIUM 40 MG PO TBEC: DELAYED_RELEASE_TABLET | ORAL | Status: DC

## 2013-04-05 NOTE — Telephone Encounter (Signed)
Pharmacy sent fax, requesting 90 day supply Atorvastatin and Pantoprazole. Last lipid panel 04/28/12, has appointment scheduled with Dr. Nicki Reaper on 04/07/13 @ 2:00.

## 2013-04-05 NOTE — Telephone Encounter (Signed)
Ok refill? 

## 2013-04-05 NOTE — Telephone Encounter (Signed)
If she is not out of medication, I would prefer to refill at her appt - when she comes in.  Thanks.  If out, let me know and can refill x 1 only.

## 2013-04-07 ENCOUNTER — Ambulatory Visit: Payer: Medicare Other | Admitting: Internal Medicine

## 2013-04-07 ENCOUNTER — Other Ambulatory Visit: Payer: Self-pay | Admitting: *Deleted

## 2013-04-10 ENCOUNTER — Other Ambulatory Visit: Payer: Self-pay | Admitting: Internal Medicine

## 2013-04-10 MED ORDER — CHLORTHALIDONE 25 MG PO TABS: ORAL_TABLET | ORAL | Status: DC

## 2013-04-10 MED ORDER — ATORVASTATIN CALCIUM 80 MG PO TABS: ORAL_TABLET | ORAL | Status: DC

## 2013-05-02 ENCOUNTER — Encounter: Payer: Self-pay | Admitting: Internal Medicine

## 2013-05-02 ENCOUNTER — Ambulatory Visit (INDEPENDENT_AMBULATORY_CARE_PROVIDER_SITE_OTHER): Payer: Medicare Other | Admitting: Internal Medicine

## 2013-05-02 VITALS — BP 120/82 | HR 69 | Temp 98.4°F | Ht 66.5 in | Wt 173.5 lb

## 2013-05-02 DIAGNOSIS — F329 Major depressive disorder, single episode, unspecified: Secondary | ICD-10-CM

## 2013-05-02 DIAGNOSIS — I10 Essential (primary) hypertension: Secondary | ICD-10-CM

## 2013-05-02 DIAGNOSIS — F419 Anxiety disorder, unspecified: Secondary | ICD-10-CM

## 2013-05-02 DIAGNOSIS — R0602 Shortness of breath: Secondary | ICD-10-CM

## 2013-05-02 DIAGNOSIS — R040 Epistaxis: Secondary | ICD-10-CM

## 2013-05-02 DIAGNOSIS — E7800 Pure hypercholesterolemia, unspecified: Secondary | ICD-10-CM

## 2013-05-02 DIAGNOSIS — F32A Depression, unspecified: Secondary | ICD-10-CM

## 2013-05-02 DIAGNOSIS — F3289 Other specified depressive episodes: Secondary | ICD-10-CM

## 2013-05-02 DIAGNOSIS — F411 Generalized anxiety disorder: Secondary | ICD-10-CM

## 2013-05-02 DIAGNOSIS — E210 Primary hyperparathyroidism: Secondary | ICD-10-CM

## 2013-05-02 DIAGNOSIS — G4700 Insomnia, unspecified: Secondary | ICD-10-CM

## 2013-05-02 DIAGNOSIS — F5104 Psychophysiologic insomnia: Secondary | ICD-10-CM

## 2013-05-02 NOTE — Progress Notes (Signed)
Subjective:    Patient ID: [name], female    DOB: March 11, 1945, 68 y.o.   MRN: [id number]  Hypertension  68 year old female with past history of hypercholesterolemia, hypertension, GERD and migraine headaches.  She comes in today for a scheduled follow up.   She is now s/p parathyroid surgery 08/25/12.  Eating and drinking ok.  Appetite is good.  No vomiting.  Lower extremity swelling improved/resolved.   She does report feeling sob with exertion.  No chest pain or tightness.  No cough or congfestion.  Still seeing Dr Nicolasa Ducking.  Stable.  She does report some increased fatigue.  Overall mood appears to be better.     Past Medical History  Diagnosis Date  . Hypertension   . Anxiety   . Depression   . GERD (gastroesophageal reflux disease)   . Hypercholesterolemia   . Chronic insomnia   . COPD (chronic obstructive pulmonary disease)     documented in outside records  . Arthritis   . Heart murmur   . Osteoporosis   . Complication of anesthesia     "AFFECTS MY MEMORY"  . Shortness of breath     with exertion  . Tingling     both hands  . History of stomach ulcers   . Hyperparathyroidism, primary   . Burning feet syndrome   . Dysrhythmia     PALPITATIONS     Current Outpatient Prescriptions on File Prior to Visit  Medication Sig Dispense Refill  . aspirin EC 81 MG tablet Take 81 mg by mouth daily.      Marland Kitchen atorvastatin (LIPITOR) 80 MG tablet TAKE 1 TABLET (80 MG TOTAL) BY MOUTH DAILY.  30 tablet  0  . buPROPion (WELLBUTRIN) 75 MG tablet Take 150 mg by mouth 2 (two) times daily.       . chlorthalidone (HYGROTON) 25 MG tablet TAKE 1 TABLET (25 MG TOTAL) BY MOUTH DAILY. NEED NEW INS  30 tablet  0  . cholecalciferol (VITAMIN D) 1000 UNITS tablet Take 2,000 Units by mouth daily.      Marland Kitchen docusate sodium (COLACE) 100 MG capsule Take 100 mg by mouth 3 (three) times daily.       Marland Kitchen gabapentin (NEURONTIN) 100 MG capsule TAKE 1-2 CAPSULES AT BEDTIME AS NEEDED. PLEASE STOP TRAMADOL  60 capsule   1  . magnesium oxide (MAG-OX) 400 (241.3 MG) MG tablet TAKE 1 TABLET (400 MG TOTAL) BY MOUTH DAILY. PROD NOT COVERED  30 tablet  5  . pantoprazole (PROTONIX) 40 MG tablet TAKE 1 TABLET (40 MG TOTAL) BY MOUTH DAILY.  90 tablet  1  . potassium chloride (K-DUR) 10 MEQ tablet Take 1 tablet (10 mEq total) by mouth daily.  30 tablet  5  . sertraline (ZOLOFT) 100 MG tablet Take 150 mg by mouth every morning.       . valsartan (DIOVAN) 160 MG tablet TAKE 1 TABLET BY MOUTH DAILY  30 tablet  5   No current facility-administered medications on file prior to visit.    Review of Systems Patient denies any headache, lightheadedness or dizziness.  No sinus or allergy symptoms currently.  No chest pain, tightness or palpitations.   Does report some sob with exertion.  No cough or congestion.  Previously saw Dr Raul Del.  No nausea or vomiting.  No abdominal pain or cramping.  No diarrhea, BRBPR or melana.  No urine change.   Increased stress and anxiety.  Seeing Dr Nicolasa Ducking.  Swelling resolved.  Some increased fatigue.         Objective:   Physical Exam  Filed Vitals:   05/02/13 1614  BP: 120/82  Pulse: 69  Temp: 98.4 F (36.9 C)   Blood pressure recheck: 33-59/51  68 year old female in no acute distress.   HEENT:  Nares- clear.  Oropharynx - without lesions. NECK:  Supple.  Nontender.  No audible bruit.  Well healed incision site.  HEART:  Appears to be irregular.  Rate controlled.  LUNGS:  No crackles or wheezing audible.  Respirations even and unlabored.  RADIAL PULSE:  Equal bilaterally.   ABDOMEN:  Soft, nontender.  Bowel sounds present and normal.  No audible abdominal bruit.    EXTREMITIES:  No increased edema.   No increased erythema.            Assessment & Plan:  LOWER EXTREMITY EDEMA.  Resolved.         CARDIOVASCULAR.  Recent ECHO revealed mild aortic sclerosis, mild TR and mild elevated pulmonary artery pressure.  She saw cardiology.  Stress test 08/23/12 - negative for ischemia.   Sob with exertion.   EKG revealed SR with frequent PACs.  Check labs.  Further w/up pending.        MENOPAUSAL SYNDROME.  Doing well off Prempro.  Follow.   PITUITARY SURGERY.  Obtain records to review more specific information about the type of tumor, etc.  Still do not have.    HEALTH MAINTENANCE.  Physical 05/02/12.   Mammo 07/28/12 - BiRADS II.

## 2013-05-02 NOTE — Progress Notes (Signed)
Pre-visit discussion using our clinic review tool. No additional management support is needed unless otherwise documented below in the visit note.  

## 2013-05-03 ENCOUNTER — Other Ambulatory Visit (INDEPENDENT_AMBULATORY_CARE_PROVIDER_SITE_OTHER): Payer: Medicare Other

## 2013-05-03 ENCOUNTER — Telehealth: Payer: Self-pay | Admitting: *Deleted

## 2013-05-03 DIAGNOSIS — F3289 Other specified depressive episodes: Secondary | ICD-10-CM

## 2013-05-03 DIAGNOSIS — E7800 Pure hypercholesterolemia, unspecified: Secondary | ICD-10-CM

## 2013-05-03 DIAGNOSIS — F419 Anxiety disorder, unspecified: Secondary | ICD-10-CM

## 2013-05-03 DIAGNOSIS — F329 Major depressive disorder, single episode, unspecified: Secondary | ICD-10-CM

## 2013-05-03 DIAGNOSIS — I10 Essential (primary) hypertension: Secondary | ICD-10-CM

## 2013-05-03 DIAGNOSIS — E210 Primary hyperparathyroidism: Secondary | ICD-10-CM

## 2013-05-03 DIAGNOSIS — F32A Depression, unspecified: Secondary | ICD-10-CM

## 2013-05-03 NOTE — Telephone Encounter (Signed)
Orders placed for labs

## 2013-05-03 NOTE — Telephone Encounter (Signed)
What labs and dx?  

## 2013-05-04 LAB — CBC WITH DIFFERENTIAL/PLATELET
BASOS PCT: 0.6 % (ref 0.0–3.0)
Basophils Absolute: 0 10*3/uL (ref 0.0–0.1)
EOS ABS: 0.2 10*3/uL (ref 0.0–0.7)
Eosinophils Relative: 2.9 % (ref 0.0–5.0)
HCT: 33.9 % — ABNORMAL LOW (ref 36.0–46.0)
Hemoglobin: 10.9 g/dL — ABNORMAL LOW (ref 12.0–15.0)
LYMPHS ABS: 1.8 10*3/uL (ref 0.7–4.0)
Lymphocytes Relative: 26.5 % (ref 12.0–46.0)
MCHC: 32 g/dL (ref 30.0–36.0)
MCV: 84.7 fl (ref 78.0–100.0)
MONO ABS: 1 10*3/uL (ref 0.1–1.0)
Monocytes Relative: 14.2 % — ABNORMAL HIGH (ref 3.0–12.0)
NEUTROS PCT: 55.8 % (ref 43.0–77.0)
Neutro Abs: 3.9 10*3/uL (ref 1.4–7.7)
Platelets: 304 10*3/uL (ref 150.0–400.0)
RBC: 4 Mil/uL (ref 3.87–5.11)
RDW: 19 % — ABNORMAL HIGH (ref 11.5–14.6)
WBC: 6.9 10*3/uL (ref 4.5–10.5)

## 2013-05-04 LAB — BASIC METABOLIC PANEL
BUN: 21 mg/dL (ref 6–23)
CALCIUM: 9.7 mg/dL (ref 8.4–10.5)
CO2: 29 mEq/L (ref 19–32)
CREATININE: 0.9 mg/dL (ref 0.4–1.2)
Chloride: 103 mEq/L (ref 96–112)
GFR: 69.21 mL/min (ref >60.00)
GLUCOSE: 97 mg/dL (ref 70–99)
Potassium: 5.1 mEq/L (ref 3.5–5.1)
SODIUM: 139 meq/L (ref 135–145)

## 2013-05-04 LAB — TSH: TSH: 0.95 u[IU]/mL (ref 0.35–5.50)

## 2013-05-04 LAB — LIPID PANEL
Cholesterol: 209 mg/dL — ABNORMAL HIGH (ref 0–200)
HDL: 54.3 mg/dL (ref >39.00)
LDL Cholesterol: 120 mg/dL — ABNORMAL HIGH (ref 0–99)
Total CHOL/HDL Ratio: 4
Triglycerides: 172 mg/dL — ABNORMAL HIGH (ref 0.0–149.0)
VLDL: 34.4 mg/dL (ref 0.0–40.0)

## 2013-05-04 LAB — HEPATIC FUNCTION PANEL
ALT: 21 U/L (ref 0–35)
AST: 22 U/L (ref 0–37)
Albumin: 4.1 g/dL (ref 3.5–5.2)
Alkaline Phosphatase: 85 U/L (ref 39–117)
BILIRUBIN DIRECT: 0.1 mg/dL (ref 0.0–0.3)
BILIRUBIN TOTAL: 0.5 mg/dL (ref 0.3–1.2)
Total Protein: 6.7 g/dL (ref 6.0–8.3)

## 2013-05-05 ENCOUNTER — Other Ambulatory Visit: Payer: Self-pay | Admitting: Internal Medicine

## 2013-05-05 DIAGNOSIS — E875 Hyperkalemia: Secondary | ICD-10-CM

## 2013-05-05 DIAGNOSIS — D649 Anemia, unspecified: Secondary | ICD-10-CM

## 2013-05-05 NOTE — Progress Notes (Signed)
Orders placed for f/u labs.  

## 2013-05-06 ENCOUNTER — Encounter: Payer: Self-pay | Admitting: Internal Medicine

## 2013-05-06 DIAGNOSIS — R0602 Shortness of breath: Secondary | ICD-10-CM | POA: Insufficient documentation

## 2013-05-06 NOTE — Assessment & Plan Note (Signed)
Blood pressure is doing better.  Does report some fatigue.  Follow pressures.  Same medication regimen.   Check metabolic panel.

## 2013-05-06 NOTE — Assessment & Plan Note (Signed)
Seeing Dr Kapur.   

## 2013-05-06 NOTE — Assessment & Plan Note (Signed)
Seeing Dr Kapur.  She is following closely.  Doing better.  Follow.   

## 2013-05-06 NOTE — Assessment & Plan Note (Signed)
Seeing Dr Kapur.  On Wellbutrin and Sertraline now.  Continues f/u with Dr Kapur.  Doing better.    

## 2013-05-06 NOTE — Assessment & Plan Note (Signed)
SOB with exertion and fatigue as outlined.  EKG revealed SR with frequent PACs.  Just had ECHO and stress test less than one year ago.  Check cbc, met c and tsh.  Further w/up pending.  Follow.

## 2013-05-06 NOTE — Assessment & Plan Note (Signed)
S/p parathyroid surgery.  Doing well.  Continue to follow up with Dr Gerkin.  Last calcium check wnl.   

## 2013-05-06 NOTE — Assessment & Plan Note (Signed)
Low cholesterol diet.  Check fasting lipid profile.    

## 2013-05-06 NOTE — Assessment & Plan Note (Signed)
Has had multiple nose bleeds recently.  S/p cauterization.  No bleeding now.  Follow.

## 2013-05-08 ENCOUNTER — Other Ambulatory Visit (INDEPENDENT_AMBULATORY_CARE_PROVIDER_SITE_OTHER): Payer: Medicare Other

## 2013-05-08 DIAGNOSIS — D649 Anemia, unspecified: Secondary | ICD-10-CM

## 2013-05-08 DIAGNOSIS — E875 Hyperkalemia: Secondary | ICD-10-CM

## 2013-05-09 LAB — IBC PANEL
IRON: 45 ug/dL (ref 42–145)
SATURATION RATIOS: 8.6 % — AB (ref 20.0–50.0)
Transferrin: 374.9 mg/dL — ABNORMAL HIGH (ref 212.0–360.0)

## 2013-05-09 LAB — CBC WITH DIFFERENTIAL/PLATELET
BASOS ABS: 0 10*3/uL (ref 0.0–0.1)
Basophils Relative: 0.4 % (ref 0.0–3.0)
Eosinophils Absolute: 0.2 10*3/uL (ref 0.0–0.7)
Eosinophils Relative: 2.4 % (ref 0.0–5.0)
HEMATOCRIT: 32.5 % — AB (ref 36.0–46.0)
Hemoglobin: 10.2 g/dL — ABNORMAL LOW (ref 12.0–15.0)
Lymphocytes Relative: 24.5 % (ref 12.0–46.0)
Lymphs Abs: 1.6 10*3/uL (ref 0.7–4.0)
MCHC: 31.5 g/dL (ref 30.0–36.0)
MCV: 84.5 fl (ref 78.0–100.0)
MONOS PCT: 10.7 % (ref 3.0–12.0)
Monocytes Absolute: 0.7 10*3/uL (ref 0.1–1.0)
NEUTROS PCT: 62 % (ref 43.0–77.0)
Neutro Abs: 4.2 10*3/uL (ref 1.4–7.7)
Platelets: 302 10*3/uL (ref 150.0–400.0)
RBC: 3.85 Mil/uL — ABNORMAL LOW (ref 3.87–5.11)
RDW: 18.9 % — AB (ref 11.5–14.6)
WBC: 6.7 10*3/uL (ref 4.5–10.5)

## 2013-05-09 LAB — VITAMIN B12: VITAMIN B 12: 427 pg/mL (ref 211–911)

## 2013-05-09 LAB — FERRITIN: Ferritin: 6.9 ng/mL — ABNORMAL LOW (ref 10.0–291.0)

## 2013-05-09 LAB — POTASSIUM: Potassium: 4.6 mEq/L (ref 3.5–5.1)

## 2013-05-12 ENCOUNTER — Other Ambulatory Visit: Payer: Self-pay | Admitting: Internal Medicine

## 2013-05-12 ENCOUNTER — Telehealth: Payer: Self-pay | Admitting: Internal Medicine

## 2013-05-12 DIAGNOSIS — D649 Anemia, unspecified: Secondary | ICD-10-CM

## 2013-05-12 DIAGNOSIS — D509 Iron deficiency anemia, unspecified: Secondary | ICD-10-CM

## 2013-05-12 NOTE — Progress Notes (Signed)
Order placed for f/u labs.  

## 2013-05-12 NOTE — Telephone Encounter (Signed)
See lab message.  Pt notified of lab results and need for GI referral.  Order placed for GI referral.  Recheck cbc / ferritin in 2 weeks.

## 2013-05-17 ENCOUNTER — Ambulatory Visit: Payer: Medicare Other | Admitting: Internal Medicine

## 2013-05-24 ENCOUNTER — Other Ambulatory Visit: Payer: Self-pay | Admitting: Internal Medicine

## 2013-05-24 ENCOUNTER — Other Ambulatory Visit (INDEPENDENT_AMBULATORY_CARE_PROVIDER_SITE_OTHER): Payer: Medicare Other

## 2013-05-24 DIAGNOSIS — D649 Anemia, unspecified: Secondary | ICD-10-CM

## 2013-05-24 LAB — CBC WITH DIFFERENTIAL/PLATELET
BASOS ABS: 0.1 10*3/uL (ref 0.0–0.1)
BASOS PCT: 1.3 % (ref 0.0–3.0)
EOS ABS: 0.2 10*3/uL (ref 0.0–0.7)
Eosinophils Relative: 3.5 % (ref 0.0–5.0)
HCT: 34.7 % — ABNORMAL LOW (ref 36.0–46.0)
Hemoglobin: 11.2 g/dL — ABNORMAL LOW (ref 12.0–15.0)
Lymphocytes Relative: 29.1 % (ref 12.0–46.0)
Lymphs Abs: 1.9 10*3/uL (ref 0.7–4.0)
MCHC: 32.3 g/dL (ref 30.0–36.0)
MCV: 86.3 fl (ref 78.0–100.0)
MONO ABS: 0.9 10*3/uL (ref 0.1–1.0)
Monocytes Relative: 13.1 % — ABNORMAL HIGH (ref 3.0–12.0)
Neutro Abs: 3.5 10*3/uL (ref 1.4–7.7)
Neutrophils Relative %: 53 % (ref 43.0–77.0)
Platelets: 313 10*3/uL (ref 150.0–400.0)
RBC: 4.01 Mil/uL (ref 3.87–5.11)
RDW: 21.6 % — ABNORMAL HIGH (ref 11.5–14.6)
WBC: 6.6 10*3/uL (ref 4.5–10.5)

## 2013-05-24 LAB — FERRITIN: FERRITIN: 11 ng/mL (ref 10.0–291.0)

## 2013-05-25 ENCOUNTER — Encounter: Payer: Self-pay | Admitting: *Deleted

## 2013-05-30 ENCOUNTER — Encounter: Payer: Self-pay | Admitting: Internal Medicine

## 2013-05-30 ENCOUNTER — Ambulatory Visit (INDEPENDENT_AMBULATORY_CARE_PROVIDER_SITE_OTHER): Payer: Medicare Other | Admitting: Internal Medicine

## 2013-05-30 VITALS — BP 120/80 | HR 56 | Temp 97.8°F | Ht 66.5 in | Wt 176.0 lb

## 2013-05-30 DIAGNOSIS — E210 Primary hyperparathyroidism: Secondary | ICD-10-CM

## 2013-05-30 DIAGNOSIS — R0602 Shortness of breath: Secondary | ICD-10-CM

## 2013-05-30 DIAGNOSIS — Z0000 Encounter for general adult medical examination without abnormal findings: Secondary | ICD-10-CM

## 2013-05-30 DIAGNOSIS — D649 Anemia, unspecified: Secondary | ICD-10-CM

## 2013-05-30 DIAGNOSIS — F329 Major depressive disorder, single episode, unspecified: Secondary | ICD-10-CM

## 2013-05-30 DIAGNOSIS — M810 Age-related osteoporosis without current pathological fracture: Secondary | ICD-10-CM

## 2013-05-30 DIAGNOSIS — F32A Depression, unspecified: Secondary | ICD-10-CM

## 2013-05-30 DIAGNOSIS — E7800 Pure hypercholesterolemia, unspecified: Secondary | ICD-10-CM

## 2013-05-30 DIAGNOSIS — F411 Generalized anxiety disorder: Secondary | ICD-10-CM

## 2013-05-30 DIAGNOSIS — F5104 Psychophysiologic insomnia: Secondary | ICD-10-CM

## 2013-05-30 DIAGNOSIS — G4700 Insomnia, unspecified: Secondary | ICD-10-CM

## 2013-05-30 DIAGNOSIS — I10 Essential (primary) hypertension: Secondary | ICD-10-CM

## 2013-05-30 DIAGNOSIS — F419 Anxiety disorder, unspecified: Secondary | ICD-10-CM

## 2013-05-30 DIAGNOSIS — F3289 Other specified depressive episodes: Secondary | ICD-10-CM

## 2013-05-30 NOTE — Progress Notes (Signed)
Pre visit review using our clinic review tool, if applicable. No additional management support is needed unless otherwise documented below in the visit note. 

## 2013-06-03 ENCOUNTER — Encounter: Payer: Self-pay | Admitting: Internal Medicine

## 2013-06-03 DIAGNOSIS — M810 Age-related osteoporosis without current pathological fracture: Secondary | ICD-10-CM | POA: Insufficient documentation

## 2013-06-03 DIAGNOSIS — D649 Anemia, unspecified: Secondary | ICD-10-CM | POA: Insufficient documentation

## 2013-06-03 NOTE — Assessment & Plan Note (Signed)
Seeing Dr Kapur.   

## 2013-06-03 NOTE — Assessment & Plan Note (Signed)
Needs reclast.  Complete form for prior authorization.

## 2013-06-03 NOTE — Assessment & Plan Note (Signed)
Blood pressure is doing better.  Follow pressures.  Same medication regimen.   Check metabolic panel.

## 2013-06-03 NOTE — Progress Notes (Signed)
Subjective:    Patient ID: [name], female    DOB: [birth year], 68 y.o.   MRN: [id number]  Hypertension  68 year old female with past history of hypercholesterolemia, hypertension, GERD and migraine headaches.  She comes in today for a scheduled follow up.   She is now s/p parathyroid surgery 08/25/12.  Eating and drinking ok.  Appetite is good.  No vomiting.  Lower extremity swelling improved/resolved.   She had previously reported feeling sob with exertion.  See last note for details.  Had relatively recent cardiac w/up.  Negative.  Found to be anemic.  hgb improving.  Breathing better. No chest pain or tightness.  No cough or congfestion.  Still seeing Dr Nicolasa Ducking.  Stable.   Overall mood appears to be better.  Feels better.  Energy better.  Still some sob with exertion.     Past Medical History  Diagnosis Date  . Hypertension   . Anxiety   . Depression   . GERD (gastroesophageal reflux disease)   . Hypercholesterolemia   . Chronic insomnia   . COPD (chronic obstructive pulmonary disease)     documented in outside records  . Arthritis   . Heart murmur   . Osteoporosis   . Complication of anesthesia     "AFFECTS MY MEMORY"  . Shortness of breath     with exertion  . Tingling     both hands  . History of stomach ulcers   . Hyperparathyroidism, primary   . Burning feet syndrome   . Dysrhythmia     PALPITATIONS     Current Outpatient Prescriptions on File Prior to Visit  Medication Sig Dispense Refill  . aspirin EC 81 MG tablet Take 81 mg by mouth daily.      Marland Kitchen atorvastatin (LIPITOR) 80 MG tablet TAKE 1 TABLET (80 MG TOTAL) BY MOUTH DAILY.  30 tablet  0  . buPROPion (WELLBUTRIN) 75 MG tablet Take 150 mg by mouth 2 (two) times daily.       . chlorthalidone (HYGROTON) 25 MG tablet TAKE 1 TABLET (25 MG TOTAL) BY MOUTH DAILY. NEED NEW INS  30 tablet  0  . cholecalciferol (VITAMIN D) 1000 UNITS tablet Take 2,000 Units by mouth daily.      Marland Kitchen docusate sodium (COLACE) 100 MG  capsule Take 100 mg by mouth 3 (three) times daily.       Marland Kitchen gabapentin (NEURONTIN) 100 MG capsule TAKE 1-2 CAPSULES AT BEDTIME AS NEEDED. PLEASE STOP TRAMADOL  60 capsule  1  . magnesium oxide (MAG-OX) 400 (241.3 MG) MG tablet TAKE 1 TABLET (400 MG TOTAL) BY MOUTH DAILY. PROD NOT COVERED  30 tablet  5  . pantoprazole (PROTONIX) 40 MG tablet TAKE 1 TABLET (40 MG TOTAL) BY MOUTH DAILY.  90 tablet  1  . potassium chloride (K-DUR) 10 MEQ tablet Take 1 tablet (10 mEq total) by mouth daily.  30 tablet  5  . sertraline (ZOLOFT) 100 MG tablet Take 150 mg by mouth every morning.       . valsartan (DIOVAN) 160 MG tablet TAKE 1 TABLET BY MOUTH DAILY  30 tablet  5   No current facility-administered medications on file prior to visit.    Review of Systems Patient denies any headache, lightheadedness or dizziness.  No sinus or allergy symptoms currently.  No chest pain, tightness or palpitations.   Does report some sob with exertion.  Still persistent, but is some better.   No cough or congestion.  Previously saw Dr Raul Del.  No nausea or vomiting.  No abdominal pain or cramping.  No diarrhea, BRBPR or melana.  No urine change.   Increased stress and anxiety.  Seeing Dr Nicolasa Ducking.  Swelling resolved.         Objective:   Physical Exam  Filed Vitals:   05/30/13 0928  BP: 120/80  Pulse: 56  Temp: 97.8 F (36.6 C)   Blood pressure recheck: 138-140/82, pulse 57  68 year old female in no acute distress.   HEENT:  Nares- clear.  Oropharynx - without lesions. NECK:  Supple.  Nontender.  No audible bruit.  Well healed incision site.  HEART:  Appears to be irregular.  Rate controlled.  LUNGS:  No crackles or wheezing audible.  Respirations even and unlabored.  RADIAL PULSE:  Equal bilaterally.   ABDOMEN:  Soft, nontender.  Bowel sounds present and normal.  No audible abdominal bruit.    EXTREMITIES:  No increased edema.   No increased erythema.            Assessment & Plan:  LOWER EXTREMITY EDEMA.   Resolved.         CARDIOVASCULAR.  Recent ECHO revealed mild aortic sclerosis, mild TR and mild elevated pulmonary artery pressure.  She saw cardiology.  Stress test 08/23/12 - negative for ischemia.  Sob with exertion.   EKG as outlined in last note.  Found to be anemic.  On iron.  Hgb improved.  Breathing some better.  Still persistent.  Will have cardiology evaluate with question of need for further w/up or evaluation.        MENOPAUSAL SYNDROME.  Doing well off Prempro.  Follow.   PITUITARY SURGERY.  Obtain records to review more specific information about the type of tumor, etc.     HEALTH MAINTENANCE.  Physical 05/02/12.   Mammo 07/28/12 - BiRADS II.

## 2013-06-03 NOTE — Assessment & Plan Note (Signed)
Low cholesterol diet.  Check fasting lipid profile.    

## 2013-06-03 NOTE — Assessment & Plan Note (Signed)
Seeing Dr Kapur.  She is following closely.  Doing better.  Follow.   

## 2013-06-03 NOTE — Assessment & Plan Note (Signed)
Seeing Dr Kapur.  On Wellbutrin and Sertraline now.  Continues f/u with Dr Kapur.  Doing better.    

## 2013-06-03 NOTE — Assessment & Plan Note (Signed)
SOB with exertion as outlined in last note.  EKG revealed SR with frequent PACs.  Just had ECHO and stress test less than one year.  Found to be anemic.  Started on iron.  Hgb better.  Sob has improved some.  Still some sob with exertion.  Refer to cardiology for further evaluation and w/up.

## 2013-06-03 NOTE — Assessment & Plan Note (Signed)
Found to be anemic on exam.  Started on iron secondary to iron deficiency.  Last hgb improved.  Follow cbc.  Keep appt with  GI for further w/up and evaluation.

## 2013-06-03 NOTE — Assessment & Plan Note (Signed)
S/p parathyroid surgery.  Doing well.  Continue to follow up with Dr Gerkin.  Last calcium check wnl.   

## 2013-06-26 ENCOUNTER — Other Ambulatory Visit: Payer: Medicare Other

## 2013-06-27 ENCOUNTER — Other Ambulatory Visit: Payer: Medicare Other

## 2013-06-30 ENCOUNTER — Other Ambulatory Visit (INDEPENDENT_AMBULATORY_CARE_PROVIDER_SITE_OTHER): Payer: Medicare Other

## 2013-06-30 DIAGNOSIS — Z0000 Encounter for general adult medical examination without abnormal findings: Secondary | ICD-10-CM

## 2013-06-30 DIAGNOSIS — M810 Age-related osteoporosis without current pathological fracture: Secondary | ICD-10-CM

## 2013-06-30 DIAGNOSIS — D649 Anemia, unspecified: Secondary | ICD-10-CM

## 2013-06-30 DIAGNOSIS — I10 Essential (primary) hypertension: Secondary | ICD-10-CM

## 2013-06-30 DIAGNOSIS — E7800 Pure hypercholesterolemia, unspecified: Secondary | ICD-10-CM

## 2013-06-30 LAB — LIPID PANEL
Cholesterol: 199 mg/dL (ref 0–200)
HDL: 61.1 mg/dL (ref >39.00)
LDL Cholesterol: 109 mg/dL — ABNORMAL HIGH (ref 0–99)
TRIGLYCERIDES: 145 mg/dL (ref 0.0–149.0)
Total CHOL/HDL Ratio: 3
VLDL: 29 mg/dL (ref 0.0–40.0)

## 2013-06-30 LAB — HEPATIC FUNCTION PANEL
ALBUMIN: 4.1 g/dL (ref 3.5–5.2)
ALT: 29 U/L (ref 0–35)
AST: 30 U/L (ref 0–37)
Alkaline Phosphatase: 86 U/L (ref 39–117)
Bilirubin, Direct: 0.1 mg/dL (ref 0.0–0.3)
Total Bilirubin: 0.7 mg/dL (ref 0.2–1.2)
Total Protein: 6.9 g/dL (ref 6.0–8.3)

## 2013-06-30 LAB — CBC WITH DIFFERENTIAL/PLATELET
Basophils Absolute: 0.1 10*3/uL (ref 0.0–0.1)
Basophils Relative: 0.7 % (ref 0.0–3.0)
EOS PCT: 1.2 % (ref 0.0–5.0)
Eosinophils Absolute: 0.1 10*3/uL (ref 0.0–0.7)
HCT: 41 % (ref 36.0–46.0)
Hemoglobin: 13.5 g/dL (ref 12.0–15.0)
Lymphocytes Relative: 22.4 % (ref 12.0–46.0)
Lymphs Abs: 2.4 10*3/uL (ref 0.7–4.0)
MCHC: 32.8 g/dL (ref 30.0–36.0)
MCV: 94.3 fl (ref 78.0–100.0)
MONOS PCT: 12.1 % — AB (ref 3.0–12.0)
Monocytes Absolute: 1.3 10*3/uL — ABNORMAL HIGH (ref 0.1–1.0)
Neutro Abs: 6.8 10*3/uL (ref 1.4–7.7)
Neutrophils Relative %: 63.6 % (ref 43.0–77.0)
PLATELETS: 287 10*3/uL (ref 150.0–400.0)
RBC: 4.35 Mil/uL (ref 3.87–5.11)
RDW: 23.4 % — ABNORMAL HIGH (ref 11.5–15.5)
WBC: 10.7 10*3/uL — ABNORMAL HIGH (ref 4.0–10.5)

## 2013-06-30 LAB — BASIC METABOLIC PANEL
BUN: 21 mg/dL (ref 6–23)
CO2: 28 mEq/L (ref 19–32)
CREATININE: 1 mg/dL (ref 0.4–1.2)
Calcium: 9.9 mg/dL (ref 8.4–10.5)
Chloride: 100 mEq/L (ref 96–112)
GFR: 58.24 mL/min — ABNORMAL LOW (ref >60.00)
GLUCOSE: 91 mg/dL (ref 70–99)
Potassium: 4.2 mEq/L (ref 3.5–5.1)
Sodium: 140 mEq/L (ref 135–145)

## 2013-06-30 LAB — FERRITIN: FERRITIN: 23.1 ng/mL (ref 10.0–291.0)

## 2013-07-01 LAB — VARICELLA ZOSTER ANTIBODY, IGG: Varicella IgG: 748.8 Index — ABNORMAL HIGH (ref <135.00)

## 2013-07-01 LAB — VITAMIN D 25 HYDROXY (VIT D DEFICIENCY, FRACTURES): VIT D 25 HYDROXY: 71 ng/mL (ref 30–89)

## 2013-07-04 ENCOUNTER — Encounter: Payer: Self-pay | Admitting: *Deleted

## 2013-07-04 ENCOUNTER — Other Ambulatory Visit: Payer: Self-pay | Admitting: Internal Medicine

## 2013-07-10 ENCOUNTER — Other Ambulatory Visit: Payer: Self-pay | Admitting: Internal Medicine

## 2013-07-11 ENCOUNTER — Other Ambulatory Visit: Payer: Self-pay | Admitting: Internal Medicine

## 2013-07-16 ENCOUNTER — Other Ambulatory Visit: Payer: Self-pay | Admitting: Internal Medicine

## 2013-07-29 ENCOUNTER — Other Ambulatory Visit: Payer: Self-pay | Admitting: Internal Medicine

## 2013-08-17 DIAGNOSIS — D509 Iron deficiency anemia, unspecified: Secondary | ICD-10-CM | POA: Insufficient documentation

## 2013-08-18 ENCOUNTER — Other Ambulatory Visit (HOSPITAL_COMMUNITY)
Admission: RE | Admit: 2013-08-18 | Discharge: 2013-08-18 | Disposition: A | Discharge status: Home / Self Care | Payer: Medicare Other | Source: Ambulatory Visit | Attending: Internal Medicine | Admitting: Internal Medicine

## 2013-08-18 ENCOUNTER — Encounter: Payer: Self-pay | Admitting: Internal Medicine

## 2013-08-18 ENCOUNTER — Ambulatory Visit (INDEPENDENT_AMBULATORY_CARE_PROVIDER_SITE_OTHER): Payer: Medicare Other | Admitting: Internal Medicine

## 2013-08-18 VITALS — BP 130/100 | HR 55 | Temp 98.3°F | Ht 66.0 in | Wt 178.5 lb

## 2013-08-18 DIAGNOSIS — F419 Anxiety disorder, unspecified: Secondary | ICD-10-CM

## 2013-08-18 DIAGNOSIS — E210 Primary hyperparathyroidism: Secondary | ICD-10-CM

## 2013-08-18 DIAGNOSIS — F329 Major depressive disorder, single episode, unspecified: Secondary | ICD-10-CM

## 2013-08-18 DIAGNOSIS — M25569 Pain in unspecified knee: Secondary | ICD-10-CM

## 2013-08-18 DIAGNOSIS — D649 Anemia, unspecified: Secondary | ICD-10-CM

## 2013-08-18 DIAGNOSIS — F5104 Psychophysiologic insomnia: Secondary | ICD-10-CM

## 2013-08-18 DIAGNOSIS — Z1151 Encounter for screening for human papillomavirus (HPV): Secondary | ICD-10-CM | POA: Insufficient documentation

## 2013-08-18 DIAGNOSIS — F411 Generalized anxiety disorder: Secondary | ICD-10-CM

## 2013-08-18 DIAGNOSIS — F32A Depression, unspecified: Secondary | ICD-10-CM

## 2013-08-18 DIAGNOSIS — G4700 Insomnia, unspecified: Secondary | ICD-10-CM

## 2013-08-18 DIAGNOSIS — Z124 Encounter for screening for malignant neoplasm of cervix: Secondary | ICD-10-CM

## 2013-08-18 DIAGNOSIS — M25561 Pain in right knee: Secondary | ICD-10-CM

## 2013-08-18 DIAGNOSIS — I10 Essential (primary) hypertension: Secondary | ICD-10-CM

## 2013-08-18 DIAGNOSIS — Z01419 Encounter for gynecological examination (general) (routine) without abnormal findings: Secondary | ICD-10-CM | POA: Insufficient documentation

## 2013-08-18 DIAGNOSIS — E7800 Pure hypercholesterolemia, unspecified: Secondary | ICD-10-CM

## 2013-08-18 DIAGNOSIS — M810 Age-related osteoporosis without current pathological fracture: Secondary | ICD-10-CM

## 2013-08-18 DIAGNOSIS — R0602 Shortness of breath: Secondary | ICD-10-CM

## 2013-08-18 DIAGNOSIS — F3289 Other specified depressive episodes: Secondary | ICD-10-CM

## 2013-08-18 MED ORDER — CHLORTHALIDONE 25 MG PO TABS: ORAL_TABLET | ORAL | Status: DC

## 2013-08-18 MED ORDER — VALSARTAN 320 MG PO TABS: 320.0000 mg | ORAL_TABLET | Freq: Every day | ORAL | Status: DC

## 2013-08-18 NOTE — Patient Instructions (Signed)
Change valsartan (Diovan) to 320mg  per day.

## 2013-08-18 NOTE — Progress Notes (Signed)
Pre visit review using our clinic review tool, if applicable. No additional management support is needed unless otherwise documented below in the visit note. 

## 2013-08-22 LAB — CYTOLOGY - PAP

## 2013-08-22 NOTE — Assessment & Plan Note (Addendum)
Saw GI 08/17/13.  Planning for EGD and colonoscopy.  Follow cbc.

## 2013-08-23 ENCOUNTER — Encounter: Payer: Self-pay | Admitting: *Deleted

## 2013-08-24 ENCOUNTER — Encounter: Payer: Self-pay | Admitting: Internal Medicine

## 2013-08-24 DIAGNOSIS — M25561 Pain in right knee: Secondary | ICD-10-CM | POA: Insufficient documentation

## 2013-08-24 NOTE — Assessment & Plan Note (Signed)
Seeing Dr Nicolasa Ducking.

## 2013-08-24 NOTE — Assessment & Plan Note (Signed)
Blood pressure elevated.  More initially.  Continue same medication for now.  Follow pressures.  Have her spot check her pressures.  Will adjust as needed.  Check metabolic panel.

## 2013-08-24 NOTE — Assessment & Plan Note (Signed)
Right knee pain.  Discussed further w/up including xray.  She declines at this time.  Wants to monitor.  Will let me know if she changes her mind.

## 2013-08-24 NOTE — Assessment & Plan Note (Signed)
Low cholesterol diet.  Check fasting lipid profile.

## 2013-08-24 NOTE — Assessment & Plan Note (Signed)
Seeing Dr Nicolasa Ducking.  On Wellbutrin and Sertraline now.  Continues f/u with Dr Nicolasa Ducking.  Doing better.

## 2013-08-24 NOTE — Progress Notes (Signed)
Subjective:    Patient ID: [name], female    DOB: Mar 28, 1945, 68 y.o.   MRN: [id number]  Hypertension  68 year old female with past history of hypercholesterolemia, hypertension, GERD and migraine headaches.  She comes in today to follow up on these issues as well as for a complete physical exam.  She is now s/p parathyroid surgery 08/25/12.  Eating and drinking ok.  Appetite is good.  No vomiting.  Lower extremity swelling improved/resolved.   She had previously reported feeling sob with exertion.  See previous notes for details.   Had relatively recent cardiac w/up.  Negative.  Found to be anemic.  hgb improving.  Breathing better. No chest pain or tightness.  No cough or congfestion.  Still seeing Dr Nicolasa Ducking.  Stable.   Overall mood appears to be better.  Feels better.  Energy better.  Her biggest complaint today is that of right knee pain.  We discussed further w/up.  She declines.  States her blood pressure has been under good control on outside checks.      Past Medical History  Diagnosis Date  . Hypertension   . Anxiety   . Depression   . GERD (gastroesophageal reflux disease)   . Hypercholesterolemia   . Chronic insomnia   . COPD (chronic obstructive pulmonary disease)     documented in outside records  . Arthritis   . Heart murmur   . Osteoporosis   . Complication of anesthesia     "AFFECTS MY MEMORY"  . Shortness of breath     with exertion  . Tingling     both hands  . History of stomach ulcers   . Hyperparathyroidism, primary   . Burning feet syndrome   . Dysrhythmia     PALPITATIONS     Current Outpatient Prescriptions on File Prior to Visit  Medication Sig Dispense Refill  . aspirin EC 81 MG tablet Take 81 mg by mouth daily.      Marland Kitchen atorvastatin (LIPITOR) 80 MG tablet TAKE 1 TABLET (80 MG TOTAL) BY MOUTH DAILY.  30 tablet  0  . atorvastatin (LIPITOR) 80 MG tablet TAKE 1 TABLET (80 MG TOTAL) BY MOUTH DAILY.  30 tablet  5  . buPROPion (WELLBUTRIN) 75 MG  tablet Take 150 mg by mouth 2 (two) times daily.       . cholecalciferol (VITAMIN D) 1000 UNITS tablet Take 2,000 Units by mouth daily.      Marland Kitchen docusate sodium (COLACE) 100 MG capsule Take 100 mg by mouth 3 (three) times daily.       Marland Kitchen gabapentin (NEURONTIN) 100 MG capsule TAKE 1-2 CAPSULES AT BEDTIME AS NEEDED. PLEASE STOP TRAMADOL  60 capsule  1  . IRON PO Take 45 mg by mouth daily.       Marland Kitchen KLOR-CON M10 10 MEQ tablet TAKE 1 TABLET BY MOUTH DAILY.  30 tablet  5  . magnesium oxide (MAG-OX) 400 (241.3 MG) MG tablet TAKE 1 TABLET (400 MG TOTAL) BY MOUTH DAILY. PROD NOT COVERED  30 tablet  5  . metoprolol (LOPRESSOR) 50 MG tablet Take 25 mg by mouth 2 (two) times daily.      . metoprolol (LOPRESSOR) 50 MG tablet TAKE 1 TABLET BY MOUTH EVERY 12 HOURS.  60 tablet  5  . pantoprazole (PROTONIX) 40 MG tablet TAKE 1 TABLET (40 MG TOTAL) BY MOUTH DAILY.  90 tablet  1  . sertraline (ZOLOFT) 100 MG tablet Take 150 mg by mouth every  morning.       . [DISCONTINUED] potassium chloride (K-DUR) 10 MEQ tablet Take 1 tablet (10 mEq total) by mouth daily.  30 tablet  5   No current facility-administered medications on file prior to visit.    Review of Systems Patient denies any headache, lightheadedness or dizziness.  No sinus or allergy symptoms currently.  No chest pain, tightness or palpitations.   No sob reported.   No cough or congestion.  Previously saw Dr Raul Del.  No nausea or vomiting.  No abdominal pain or cramping.  No diarrhea, BRBPR or melana.  No urine change.   Increased stress and anxiety.  Seeing Dr Nicolasa Ducking.  Swelling resolved.  Right knee pain as outlined.  Back is better.       Objective:   Physical Exam  Filed Vitals:   08/18/13 1550  BP: 130/100  Pulse: 55  Temp: 98.3 F (36.8 C)   Blood pressure recheck: 55-49/5  68 year old female in no acute distress.   HEENT:  Nares- clear.  Oropharynx - without lesions. NECK:  Supple.  Nontender.  No audible bruit.  HEART:  Appears to be  regular. LUNGS:  No crackles or wheezing audible.  Respirations even and unlabored.  RADIAL PULSE:  Equal bilaterally.    BREASTS:  No nipple discharge or nipple retraction present.  Could not appreciate any distinct nodules or axillary adenopathy.  ABDOMEN:  Soft, nontender.  Bowel sounds present and normal.  No audible abdominal bruit.  GU:  Not performed.    EXTREMITIES:  No increased edema present.  Resolved.  DP pulses palpable and equal bilaterally.          Assessment & Plan:  LOWER EXTREMITY EDEMA.  Resolved.         CARDIOVASCULAR.  Recent ECHO revealed mild aortic sclerosis, mild TR and mild elevated pulmonary artery pressure.  She saw cardiology.  Stress test 08/23/12 - negative for ischemia.  Currently stable.        MENOPAUSAL SYNDROME.  Doing well off Prempro.  Follow.   PITUITARY SURGERY.  Still need records to review more specific information about the type of tumor, etc.     HEALTH MAINTENANCE.  Physical today.   Mammo 07/28/12 - BiRADS II.  Mammogram scheduled for 09/15/13.  Saw GI 08/17/13.  Planning for EGD and colonoscopy.

## 2013-08-24 NOTE — Assessment & Plan Note (Signed)
No sob reported today.  Recent stress test negative.  Saw cardiology (Dr Ubaldo Glassing).

## 2013-08-24 NOTE — Assessment & Plan Note (Signed)
Seeing Dr Nicolasa Ducking.  She is following closely.  Doing better.  Follow.

## 2013-08-24 NOTE — Assessment & Plan Note (Signed)
S/p parathyroid surgery.  Doing well.  Continue to follow up with Dr Harlow Asa.  Last calcium check wnl.

## 2013-08-24 NOTE — Assessment & Plan Note (Signed)
Needs reclast.  Form completed.  Apparently pt has not followed through.

## 2013-08-25 ENCOUNTER — Telehealth: Payer: Self-pay | Admitting: *Deleted

## 2013-08-25 DIAGNOSIS — E7800 Pure hypercholesterolemia, unspecified: Secondary | ICD-10-CM

## 2013-08-25 DIAGNOSIS — I10 Essential (primary) hypertension: Secondary | ICD-10-CM

## 2013-08-25 DIAGNOSIS — D649 Anemia, unspecified: Secondary | ICD-10-CM

## 2013-08-25 DIAGNOSIS — E210 Primary hyperparathyroidism: Secondary | ICD-10-CM

## 2013-08-25 NOTE — Telephone Encounter (Signed)
Pt is coming in on Monday what labs and dx?

## 2013-08-27 NOTE — Telephone Encounter (Signed)
Labs ordered.

## 2013-08-28 ENCOUNTER — Other Ambulatory Visit (INDEPENDENT_AMBULATORY_CARE_PROVIDER_SITE_OTHER): Payer: Medicare Other

## 2013-08-28 DIAGNOSIS — I10 Essential (primary) hypertension: Secondary | ICD-10-CM

## 2013-08-28 DIAGNOSIS — E210 Primary hyperparathyroidism: Secondary | ICD-10-CM

## 2013-08-28 DIAGNOSIS — D649 Anemia, unspecified: Secondary | ICD-10-CM

## 2013-08-28 DIAGNOSIS — E7800 Pure hypercholesterolemia, unspecified: Secondary | ICD-10-CM

## 2013-08-28 LAB — CBC WITH DIFFERENTIAL/PLATELET
Basophils Absolute: 0.1 10*3/uL (ref 0.0–0.1)
Basophils Relative: 0.7 % (ref 0.0–3.0)
Eosinophils Absolute: 0.1 10*3/uL (ref 0.0–0.7)
Eosinophils Relative: 1.6 % (ref 0.0–5.0)
HEMATOCRIT: 42.9 % (ref 36.0–46.0)
Hemoglobin: 14.3 g/dL (ref 12.0–15.0)
LYMPHS ABS: 1.8 10*3/uL (ref 0.7–4.0)
LYMPHS PCT: 20.3 % (ref 12.0–46.0)
MCHC: 33.4 g/dL (ref 30.0–36.0)
MCV: 99 fl (ref 78.0–100.0)
MONOS PCT: 12.1 % — AB (ref 3.0–12.0)
Monocytes Absolute: 1.1 10*3/uL — ABNORMAL HIGH (ref 0.1–1.0)
Neutro Abs: 5.8 10*3/uL (ref 1.4–7.7)
Neutrophils Relative %: 65.3 % (ref 43.0–77.0)
PLATELETS: 280 10*3/uL (ref 150.0–400.0)
RBC: 4.33 Mil/uL (ref 3.87–5.11)
RDW: 15.5 % (ref 11.5–15.5)
WBC: 8.8 10*3/uL (ref 4.0–10.5)

## 2013-08-28 LAB — HEPATIC FUNCTION PANEL
ALT: 23 U/L (ref 0–35)
AST: 27 U/L (ref 0–37)
Albumin: 4.1 g/dL (ref 3.5–5.2)
Alkaline Phosphatase: 87 U/L (ref 39–117)
Bilirubin, Direct: 0.2 mg/dL (ref 0.0–0.3)
Total Bilirubin: 1 mg/dL (ref 0.2–1.2)
Total Protein: 6.9 g/dL (ref 6.0–8.3)

## 2013-08-28 LAB — BASIC METABOLIC PANEL
BUN: 24 mg/dL — AB (ref 6–23)
CO2: 30 mEq/L (ref 19–32)
Calcium: 9.9 mg/dL (ref 8.4–10.5)
Chloride: 102 mEq/L (ref 96–112)
Creatinine, Ser: 0.9 mg/dL (ref 0.4–1.2)
GFR: 66.49 mL/min (ref >60.00)
Glucose, Bld: 110 mg/dL — ABNORMAL HIGH (ref 70–99)
Potassium: 3.9 mEq/L (ref 3.5–5.1)
SODIUM: 142 meq/L (ref 135–145)

## 2013-08-28 LAB — LIPID PANEL
CHOLESTEROL: 191 mg/dL (ref 0–200)
HDL: 59.3 mg/dL (ref >39.00)
LDL Cholesterol: 89 mg/dL (ref 0–99)
NonHDL: 131.7
Total CHOL/HDL Ratio: 3
Triglycerides: 212 mg/dL — ABNORMAL HIGH (ref 0.0–149.0)
VLDL: 42.4 mg/dL — AB (ref 0.0–40.0)

## 2013-08-29 LAB — FERRITIN: Ferritin: 28.3 ng/mL (ref 10.0–291.0)

## 2013-08-29 LAB — TSH: TSH: 0.65 u[IU]/mL (ref 0.35–4.50)

## 2013-08-30 ENCOUNTER — Encounter: Payer: Self-pay | Admitting: *Deleted

## 2013-09-06 ENCOUNTER — Ambulatory Visit (INDEPENDENT_AMBULATORY_CARE_PROVIDER_SITE_OTHER): Payer: Medicare Other | Admitting: Adult Health

## 2013-09-06 ENCOUNTER — Encounter: Payer: Self-pay | Admitting: Adult Health

## 2013-09-06 VITALS — BP 138/88 | HR 71 | Temp 98.1°F | Resp 14 | Wt 176.0 lb

## 2013-09-06 DIAGNOSIS — R300 Dysuria: Secondary | ICD-10-CM

## 2013-09-06 LAB — POCT URINALYSIS DIPSTICK
Glucose, UA: NEGATIVE
Leukocytes, UA: NEGATIVE
Nitrite, UA: NEGATIVE
PROTEIN UA: NEGATIVE
RBC UA: NEGATIVE
Urobilinogen, UA: 1
pH, UA: 5.5

## 2013-09-06 MED ORDER — PHENAZOPYRIDINE HCL 100 MG PO TABS: 100.0000 mg | ORAL_TABLET | Freq: Three times a day (TID) | ORAL | Status: DC | PRN

## 2013-09-06 MED ORDER — CIPROFLOXACIN HCL 250 MG PO TABS
250.0000 mg | ORAL_TABLET | Freq: Two times a day (BID) | ORAL | Status: DC
Start: 2013-09-06 — End: 2013-09-11

## 2013-09-06 NOTE — Patient Instructions (Addendum)
  Cipro twice a day for 7 days Pyridium 1 tablet 3 times a day as needed for pain/discomfort Return to clinic if no improvement within 3-4 days.   Urinary Tract Infection Urinary tract infections (UTIs) can develop anywhere along your urinary tract. Your urinary tract is your body's drainage system for removing wastes and extra water. Your urinary tract includes two kidneys, two ureters, a bladder, and a urethra. Your kidneys are a pair of bean-shaped organs. Each kidney is about the size of your fist. They are located below your ribs, one on each side of your spine. CAUSES Infections are caused by microbes, which are microscopic organisms, including fungi, viruses, and bacteria. These organisms are so small that they can only be seen through a microscope. Bacteria are the microbes that most commonly cause UTIs. SYMPTOMS  Symptoms of UTIs may vary by age and gender of the patient and by the location of the infection. Symptoms in young women typically include a frequent and intense urge to urinate and a painful, burning feeling in the bladder or urethra during urination. Older women and men are more likely to be tired, shaky, and weak and have muscle aches and abdominal pain. A fever may mean the infection is in your kidneys. Other symptoms of a kidney infection include pain in your back or sides below the ribs, nausea, and vomiting. DIAGNOSIS To diagnose a UTI, your caregiver will ask you about your symptoms. Your caregiver also will ask to provide a urine sample. The urine sample will be tested for bacteria and white blood cells. White blood cells are made by your body to help fight infection. TREATMENT  Typically, UTIs can be treated with medication. Because most UTIs are caused by a bacterial infection, they usually can be treated with the use of antibiotics. The choice of antibiotic and length of treatment depend on your symptoms and the type of bacteria causing your infection. HOME CARE  INSTRUCTIONS  If you were prescribed antibiotics, take them exactly as your caregiver instructs you. Finish the medication even if you feel better after you have only taken some of the medication.  Drink enough water and fluids to keep your urine clear or pale yellow.  Avoid caffeine, tea, and carbonated beverages. They tend to irritate your bladder.  Empty your bladder often. Avoid holding urine for long periods of time.  Empty your bladder before and after sexual intercourse.  After a bowel movement, women should cleanse from front to back. Use each tissue only once. SEEK MEDICAL CARE IF:   You have back pain.  You develop a fever.  Your symptoms do not begin to resolve within 3 days. SEEK IMMEDIATE MEDICAL CARE IF:   You have severe back pain or lower abdominal pain.  You develop chills.  You have nausea or vomiting.  You have continued burning or discomfort with urination. MAKE SURE YOU:   Understand these instructions.  Will watch your condition.  Will get help right away if you are not doing well or get worse. Document Released: 11/05/2004 Document Revised: 07/28/2011 Document Reviewed: 03/06/2011 [facility] Patient Information 2015 [place], [place]. This information is not intended to replace advice given to you by your health care provider. Make sure you discuss any questions you have with your health care provider.

## 2013-09-06 NOTE — Progress Notes (Signed)
Patient ID: [name], female   DOB: [birth year], 68 y.o.   MRN: [id number]   Subjective:    Patient ID: [name], female    DOB: February 04, 1946, 68 y.o.   MRN: [id number]  HPI Pt presents with dysuria and pressure over bladder area. No fever or chills. No hematuria. Symptoms started yesterday.  Past Medical History  Diagnosis Date  . Hypertension   . Anxiety   . Depression   . GERD (gastroesophageal reflux disease)   . Hypercholesterolemia   . Chronic insomnia   . COPD (chronic obstructive pulmonary disease)     documented in outside records  . Arthritis   . Heart murmur   . Osteoporosis   . Complication of anesthesia     "AFFECTS MY MEMORY"  . Shortness of breath     with exertion  . Tingling     both hands  . History of stomach ulcers   . Hyperparathyroidism, primary   . Burning feet syndrome   . Dysrhythmia     PALPITATIONS     Current Outpatient Prescriptions on File Prior to Visit  Medication Sig Dispense Refill  . aspirin EC 81 MG tablet Take 81 mg by mouth daily.      Marland Kitchen atorvastatin (LIPITOR) 80 MG tablet TAKE 1 TABLET (80 MG TOTAL) BY MOUTH DAILY.  30 tablet  0  . atorvastatin (LIPITOR) 80 MG tablet TAKE 1 TABLET (80 MG TOTAL) BY MOUTH DAILY.  30 tablet  5  . buPROPion (WELLBUTRIN) 75 MG tablet Take 150 mg by mouth 2 (two) times daily.       . chlorthalidone (HYGROTON) 25 MG tablet TAKE 1 TABLET (25 MG TOTAL) BY MOUTH DAILY.  30 tablet  2  . cholecalciferol (VITAMIN D) 1000 UNITS tablet Take 2,000 Units by mouth daily.      Marland Kitchen docusate sodium (COLACE) 100 MG capsule Take 100 mg by mouth 3 (three) times daily.       Marland Kitchen gabapentin (NEURONTIN) 100 MG capsule TAKE 1-2 CAPSULES AT BEDTIME AS NEEDED. PLEASE STOP TRAMADOL  60 capsule  1  . IRON PO Take 45 mg by mouth daily.       Marland Kitchen KLOR-CON M10 10 MEQ tablet TAKE 1 TABLET BY MOUTH DAILY.  30 tablet  5  . magnesium oxide (MAG-OX) 400 (241.3 MG) MG tablet TAKE 1 TABLET (400 MG TOTAL) BY MOUTH DAILY. PROD NOT  COVERED  30 tablet  5  . metoprolol (LOPRESSOR) 50 MG tablet TAKE 1 TABLET BY MOUTH EVERY 12 HOURS.  60 tablet  5  . pantoprazole (PROTONIX) 40 MG tablet TAKE 1 TABLET (40 MG TOTAL) BY MOUTH DAILY.  90 tablet  1  . sertraline (ZOLOFT) 100 MG tablet Take 150 mg by mouth every morning.       . valsartan (DIOVAN) 320 MG tablet Take 1 tablet (320 mg total) by mouth daily.  30 tablet  2  . [DISCONTINUED] potassium chloride (K-DUR) 10 MEQ tablet Take 1 tablet (10 mEq total) by mouth daily.  30 tablet  5   No current facility-administered medications on file prior to visit.     Review of Systems  Constitutional: Negative for fever and chills.  Genitourinary: Positive for dysuria, urgency and frequency. Negative for hematuria and flank pain.       Objective:  There were no vitals taken for this visit.   Physical Exam  Constitutional: She is oriented to person, place, and time. No distress.  Cardiovascular: Normal rate  and regular rhythm.   Pulmonary/Chest: Effort normal. No respiratory distress.  Musculoskeletal: Normal range of motion.  Neurological: She is alert and oriented to person, place, and time.  Skin: Skin is warm and dry.  Psychiatric: She has a normal mood and affect. Her behavior is normal. Judgment and thought content normal.      Assessment & Plan:   1. Dysuria Sending urine for culture. Start Cipro and pyridium - POCT urinalysis dipstick - CULTURE, URINE COMPREHENSIVE

## 2013-09-06 NOTE — Progress Notes (Signed)
Pre visit review using our clinic review tool, if applicable. No additional management support is needed unless otherwise documented below in the visit note. 

## 2013-09-08 LAB — CULTURE, URINE COMPREHENSIVE
Colony Count: NO GROWTH
Organism ID, Bacteria: NO GROWTH

## 2013-09-11 ENCOUNTER — Ambulatory Visit (INDEPENDENT_AMBULATORY_CARE_PROVIDER_SITE_OTHER): Payer: Medicare Other | Admitting: Internal Medicine

## 2013-09-11 ENCOUNTER — Ambulatory Visit: Payer: Self-pay | Admitting: Internal Medicine

## 2013-09-11 ENCOUNTER — Encounter: Payer: Self-pay | Admitting: Internal Medicine

## 2013-09-11 VITALS — BP 130/90 | HR 67 | Temp 98.1°F | Ht 66.0 in | Wt 177.5 lb

## 2013-09-11 DIAGNOSIS — F411 Generalized anxiety disorder: Secondary | ICD-10-CM

## 2013-09-11 DIAGNOSIS — I10 Essential (primary) hypertension: Secondary | ICD-10-CM

## 2013-09-11 DIAGNOSIS — F419 Anxiety disorder, unspecified: Secondary | ICD-10-CM

## 2013-09-11 DIAGNOSIS — M25559 Pain in unspecified hip: Secondary | ICD-10-CM

## 2013-09-11 DIAGNOSIS — F3289 Other specified depressive episodes: Secondary | ICD-10-CM

## 2013-09-11 DIAGNOSIS — M25561 Pain in right knee: Secondary | ICD-10-CM

## 2013-09-11 DIAGNOSIS — F329 Major depressive disorder, single episode, unspecified: Secondary | ICD-10-CM

## 2013-09-11 DIAGNOSIS — F32A Depression, unspecified: Secondary | ICD-10-CM

## 2013-09-11 DIAGNOSIS — M25551 Pain in right hip: Secondary | ICD-10-CM

## 2013-09-11 DIAGNOSIS — M25569 Pain in unspecified knee: Secondary | ICD-10-CM

## 2013-09-11 NOTE — Progress Notes (Signed)
Pre visit review using our clinic review tool, if applicable. No additional management support is needed unless otherwise documented below in the visit note. 

## 2013-09-12 ENCOUNTER — Encounter: Payer: Self-pay | Admitting: Internal Medicine

## 2013-09-12 DIAGNOSIS — M25551 Pain in right hip: Secondary | ICD-10-CM | POA: Insufficient documentation

## 2013-09-12 NOTE — Assessment & Plan Note (Signed)
Right knee pain.  Exam as outlined.  Check xray.

## 2013-09-12 NOTE — Assessment & Plan Note (Signed)
Blood pressure is better.  States she gets anxious when she comes in.  On 320mg  of diovan now.  Continues chlorthalidone.  Off metoprolol.  Follow.

## 2013-09-12 NOTE — Progress Notes (Addendum)
Subjective:    Patient ID: [name], female    DOB: Nov 18, 1945, 68 y.o.   MRN: [id number]  Hip Pain   68 year old female with past history of hypercholesterolemia, hypertension, GERD and migraine headaches.  She comes in today as a work in to discuss right lower back, hip and right leg/knee pain.   States her legs ache.  Feet sting and feels like needles.  Takes gabapentin for this. Helps.  She fell 6 months ago.  She has noticed some worsening right lower back pain and pain extending around her right hip recently.  Some pain in her legs - right > left.  Right knee pain.  She has also noticed some increased lower abdominal pressure.  No significant abdominal pain.  Was checked last week and treated for possible uti.  Urine culture negative.  Taking her blood pressure medication.  Off metoprolol.       Past Medical History  Diagnosis Date  . Hypertension   . Anxiety   . Depression   . GERD (gastroesophageal reflux disease)   . Hypercholesterolemia   . Chronic insomnia   . COPD (chronic obstructive pulmonary disease)     documented in outside records  . Arthritis   . Heart murmur   . Osteoporosis   . Complication of anesthesia     "AFFECTS MY MEMORY"  . Shortness of breath     with exertion  . Tingling     both hands  . History of stomach ulcers   . Hyperparathyroidism, primary   . Burning feet syndrome   . Dysrhythmia     PALPITATIONS     Current Outpatient Prescriptions on File Prior to Visit  Medication Sig Dispense Refill  . aspirin EC 81 MG tablet Take 81 mg by mouth daily.      Marland Kitchen atorvastatin (LIPITOR) 80 MG tablet TAKE 1 TABLET (80 MG TOTAL) BY MOUTH DAILY.  30 tablet  5  . buPROPion (WELLBUTRIN) 75 MG tablet Take 150 mg by mouth 2 (two) times daily.       . chlorthalidone (HYGROTON) 25 MG tablet TAKE 1 TABLET (25 MG TOTAL) BY MOUTH DAILY.  30 tablet  2  . cholecalciferol (VITAMIN D) 1000 UNITS tablet Take 2,000 Units by mouth daily.      Marland Kitchen docusate sodium  (COLACE) 100 MG capsule Take 100 mg by mouth 3 (three) times daily.       Marland Kitchen gabapentin (NEURONTIN) 100 MG capsule TAKE 1-2 CAPSULES AT BEDTIME AS NEEDED. PLEASE STOP TRAMADOL  60 capsule  1  . IRON PO Take 45 mg by mouth daily.       Marland Kitchen KLOR-CON M10 10 MEQ tablet TAKE 1 TABLET BY MOUTH DAILY.  30 tablet  5  . magnesium oxide (MAG-OX) 400 (241.3 MG) MG tablet TAKE 1 TABLET (400 MG TOTAL) BY MOUTH DAILY. PROD NOT COVERED  30 tablet  5  . pantoprazole (PROTONIX) 40 MG tablet TAKE 1 TABLET (40 MG TOTAL) BY MOUTH DAILY.  90 tablet  1  . sertraline (ZOLOFT) 100 MG tablet Take 150 mg by mouth every morning.       . valsartan (DIOVAN) 320 MG tablet Take 1 tablet (320 mg total) by mouth daily.  30 tablet  2  . [DISCONTINUED] potassium chloride (K-DUR) 10 MEQ tablet Take 1 tablet (10 mEq total) by mouth daily.  30 tablet  5   No current facility-administered medications on file prior to visit.    Review of  Systems Patient denies any headache, lightheadedness or dizziness.  No sinus or allergy symptoms currently.  No chest pain, tightness or palpitations.  No increased shortness of breath, cough or congestion.   Right lower back pain, right hip pain and pain to right knee/involving her right knee.  Previous fall.  Eating and drinking well.  States she feels better overall.  Is tapering her psych medications.  She is on 100mg  of zoloft now.  Takes 75mg  (2 q am) wellbutrin.  Continues to follow up with Dr Nicolasa Ducking.         Objective:   Physical Exam  Filed Vitals:   09/11/13 1111  BP: 130/90  Pulse: 67  Temp: 98.1 F (36.7 C)   Blood pressure recheck: 4/40  68 year old female in no acute distress.   HEENT:  Nares- clear.  Oropharynx - without lesions. NECK:  Supple.  Nontender.  No audible bruit.  HEART:  Appears to be regular. LUNGS:  No crackles or wheezing audible.  Respirations even and unlabored.  RADIAL PULSE:  Equal bilaterally.   ABDOMEN:  Soft.  No significant tenderness to palpation.   Minimal tenderness lower abdomen/pelvic region.   Bowel sounds present and normal.  No audible abdominal bruit.    EXTREMITIES:  No increased edema.   MSK:  Some discomfort in the right lateral him and upper thigh with straight leg raise.  Some increased pulling sensation with abduction - right leg.  Soft tissue swelling - right knee.  No increased erythema.          Assessment & Plan:  LOWER EXTREMITY EDEMA.  Previous lower extremity ultrasound negative for DVT.  Wearing support hose now.  Improved.      CARDIOVASCULAR.  Recent ECHO revealed mild aortic sclerosis, mild TR and mild elevated pulmonary artery pressure.  Need records to review from Dr Raul Del.  Breathing stable.  Follow.   HEALTH MAINTENANCE.  Physical 08/18/13.   Mammo 07/28/12 - BiRADS II.  Schedule f/u mammogram.     ADDENDUM.  Pt with pelvic pain.  Persistent.  Will obtain pelvic ultrasound for further evaluation.

## 2013-09-12 NOTE — Assessment & Plan Note (Signed)
Seeing Dr Nicolasa Ducking.  She is following closely.  Doing better.  Tapering medications.  Follow.

## 2013-09-12 NOTE — Assessment & Plan Note (Signed)
Seeing Dr Nicolasa Ducking.  On Wellbutrin and Sertraline now.  Tapering.  Continues f/u with Dr Nicolasa Ducking.  Doing better.

## 2013-09-12 NOTE — Assessment & Plan Note (Signed)
Having hip, back and leg pain.  Symptoms and exam as outlined.  Will check L-S spine xray, right hip xray and right knee xray.  Further w/up pending results.

## 2013-09-13 ENCOUNTER — Telehealth: Payer: Self-pay | Admitting: Internal Medicine

## 2013-09-13 DIAGNOSIS — R102 Pelvic and perineal pain: Secondary | ICD-10-CM

## 2013-09-13 DIAGNOSIS — M25569 Pain in unspecified knee: Secondary | ICD-10-CM

## 2013-09-13 NOTE — Telephone Encounter (Signed)
Pt notified & would like to proceed with referral to Ortho & Ultrasound. She also wanted to know if you were going to give her anything for the pain in the meantime. Please advise

## 2013-09-13 NOTE — Telephone Encounter (Signed)
Notify pt that her hip xray reveals no acute abnormality.  Back xray reveals some scoliosis and degenerative (arthritis) changes.  Her knee xray reveals some arthritis changes with small amount of fluid present.  Given her increased pain and xray findings, I would like to refer her to ortho for further evaluation and w/up.  Also, given her lower abdominal /pelvic pain, I would like to obtain a pelvic ultrasound.  Let me know if agreeable and I will place the order for both.

## 2013-09-13 NOTE — Telephone Encounter (Signed)
Orders placed for ortho referral and pelvic ultrasound.   Please let her know that given her other medications and elevated blood pressure, I would recommend her using tylenol ES or arthritis  - can take 2 bid prn.

## 2013-09-14 NOTE — Telephone Encounter (Signed)
Pt.notified

## 2013-09-15 ENCOUNTER — Ambulatory Visit: Payer: Self-pay | Admitting: Unknown Physician Specialty

## 2013-09-18 ENCOUNTER — Ambulatory Visit: Payer: Self-pay | Admitting: Internal Medicine

## 2013-09-18 LAB — HM MAMMOGRAPHY: HM MAMMO: NEGATIVE

## 2013-09-19 ENCOUNTER — Encounter: Payer: Self-pay | Admitting: *Deleted

## 2013-09-19 LAB — PATHOLOGY REPORT

## 2013-09-26 ENCOUNTER — Ambulatory Visit: Payer: Self-pay | Admitting: Internal Medicine

## 2013-09-26 ENCOUNTER — Other Ambulatory Visit: Payer: Self-pay | Admitting: Internal Medicine

## 2013-09-26 DIAGNOSIS — R102 Pelvic and perineal pain: Secondary | ICD-10-CM

## 2013-09-26 NOTE — Progress Notes (Signed)
Order placed for transvaginal ultrasound.   

## 2013-09-27 ENCOUNTER — Ambulatory Visit (INDEPENDENT_AMBULATORY_CARE_PROVIDER_SITE_OTHER): Payer: Medicare Other | Admitting: Family Medicine

## 2013-09-27 ENCOUNTER — Encounter: Payer: Self-pay | Admitting: Family Medicine

## 2013-09-27 ENCOUNTER — Ambulatory Visit (INDEPENDENT_AMBULATORY_CARE_PROVIDER_SITE_OTHER)
Admission: RE | Admit: 2013-09-27 | Discharge: 2013-09-27 | Disposition: A | Discharge status: Home / Self Care | Payer: Medicare Other | Source: Ambulatory Visit | Attending: Family Medicine | Admitting: Family Medicine

## 2013-09-27 ENCOUNTER — Telehealth: Payer: Self-pay | Admitting: Internal Medicine

## 2013-09-27 VITALS — BP 130/90 | HR 70 | Temp 98.4°F | Ht 66.0 in | Wt 177.5 lb

## 2013-09-27 DIAGNOSIS — M25561 Pain in right knee: Secondary | ICD-10-CM

## 2013-09-27 DIAGNOSIS — M2170 Unequal limb length (acquired), unspecified site: Secondary | ICD-10-CM

## 2013-09-27 DIAGNOSIS — M25562 Pain in left knee: Principal | ICD-10-CM

## 2013-09-27 DIAGNOSIS — M25569 Pain in unspecified knee: Secondary | ICD-10-CM

## 2013-09-27 DIAGNOSIS — M1711 Unilateral primary osteoarthritis, right knee: Secondary | ICD-10-CM

## 2013-09-27 DIAGNOSIS — M1710 Unilateral primary osteoarthritis, unspecified knee: Secondary | ICD-10-CM

## 2013-09-27 MED ORDER — MELOXICAM 15 MG PO TABS: 15.0000 mg | ORAL_TABLET | Freq: Every day | ORAL | Status: DC

## 2013-09-27 NOTE — Progress Notes (Signed)
Pre visit review using our clinic review tool, if applicable. No additional management support is needed unless otherwise documented below in the visit note. 

## 2013-09-27 NOTE — Progress Notes (Signed)
09/27/2013    ID: [name]  MRN: [id number]  DOB: 12/20/1945  Primary Physician:  Alisa Graff, MD  Chief Complaint: Knee Pain  Subjective:   History of Present Illness:  This 68 y.o. female patient presents with 6 month + h/o B knee sided knee pain after a fall landing on her knees. No audible pop was heard. The patient has had an effusion. No symptomatic giving-way. No mechanical clicking. Joint has not locked up. Patient has been able to walk but is limping. The patient does have pain going up and down stairs or rising from a seated position.   6 months ago, fell and landed really hard.   T THA.  Takes some Tylenol.  Occ topical meds. No ice or heat.   R leg shorter 1.5 cm Pain location: diffuse Current physical activity: none Prior Knee Surgery: none Current pain meds: Tylenol Bracing: none  The PMH, PSH, Social History, Family History, Medications, and allergies have been reviewed in [facility], and have been updated if relevant.  ROS: no acute illness or fever MSK: above GI: tol po intake without nausea or vomitting. Neuro: no numbness, tingling, or radiculopathy O/w per hpi  Objective:   PHYSICAL EXAM  Blood pressure 130/90, pulse 70, temperature 98.4 F (36.9 C), temperature source Oral, height 5\' 6"  (1.676 m), weight 177 lb 8 oz (80.513 kg).  GEN: Well-developed,well-nourished,in no acute distress; alert,appropriate and cooperative throughout examination HEENT: Normocephalic and atraumatic without obvious abnormalities. Ears, externally no deformities PULM: Breathing comfortably in no respiratory distress EXT: No clubbing, cyanosis, or edema PSYCH: Normally interactive. Cooperative during the interview. Pleasant. Friendly and conversant. Not anxious or depressed appearing. Normal, full affect.  Gait: Normal heel toe pattern, antalgia ROM: loss of 3 deg on R to 115, L with loss of 2 to 120 deg Effusion: mild effusion on the R Echymosis or edema:  none Patellar tendon NT Painful PLICA: neg Patellar grind: negative Medial and lateral patellar facet loading: negative medial and lateral joint lines: medial on the R Mcmurray's pos Flexion-pinch pos Varus and valgus stress: stable Lachman: neg Ant and Post drawer: neg Hip abduction, IR, ER: WNL Hip flexion str: 5/5 Hip abd: 5/5 Quad: 5/5 VMO atrophy:No Hamstring concentric and eccentric: 5/5  Radiology: Dg Knee 1-2 Views Right  09/27/2013   CLINICAL DATA:  Knee pain, right greater than left.  EXAM: BILATERAL KNEES STANDING - 1 VIEW; RIGHT KNEE - 1-2 VIEW  COMPARISON:  None.  FINDINGS: Standing frontal view of both knees shows loss of medial joint space in the right knee with trace rightward subluxation of the femur relative the tibial plateau on the right. Small hypertrophic spurs are seen in the medial and patellofemoral compartments of the right knee.  IMPRESSION: Degenerative changes in the right knee.   Electronically Signed   By: Misty Stanley M.D.   On: 09/27/2013 14:24   Dg Knee Bilateral Standing Ap  09/27/2013   CLINICAL DATA:  Knee pain, right greater than left.  EXAM: BILATERAL KNEES STANDING - 1 VIEW; RIGHT KNEE - 1-2 VIEW  COMPARISON:  None.  FINDINGS: Standing frontal view of both knees shows loss of medial joint space in the right knee with trace rightward subluxation of the femur relative the tibial plateau on the right. Small hypertrophic spurs are seen in the medial and patellofemoral compartments of the right knee.  IMPRESSION: Degenerative changes in the right knee.   Electronically Signed   By: Verda Cumins.D.  On: 09/27/2013 14:24    Assessment & Plan:   Primary osteoarthritis of right knee  Bilateral knee pain - Plan: DG Knee Bilateral Standing AP, DG Knee 1-2 Views Right  Leg length discrepancy - R 1.5 cm shorter than the LEFT  Tricompartmental arthritis of the RIGHT knee, greatest in the medial compartment, moderate in nature. Mild left-sided  tricompartmental arthritis, greatest in the medial compartment. Fall 6 months ago likely exacerbated, other internal derangement, such as potential meniscal pathology would be possible, but given the degree of osteoarthritic change, there would likely be degenerative changes in her meniscus as well.  Trial of NSAIDs, will also inject her knee today, and she also has a wealth of other complaints including hip pain and back pain, and I'm going to see her back in followup on these issues as well.  Knee Injection, RIGHT Patient verbally consented to procedure. Risks (including potential rare risk of infection), benefits, and alternatives explained. Sterilely prepped with Chloraprep. Ethyl cholride used for anesthesia. Initially, I tried to do an anterolateral aspiration, but the patient became quite tense and anxious, and ultimately had to abort this procedure. I converted this to a traditional anteromedial sitting injection without aspiration. 8 cc Lidocaine 1% mixed with Depo-Medrol 80 mg injected using the anteromedial approach without difficulty. No complications with procedure and tolerated well. Patient had decreased pain post-injection.   Follow-up: 6 weeks. At follow-up, I may craft a small heel lift for the right. Likely post-hip fx change, which could influence everything from knee, ankle, to back.  I appreciate the opportunity to evaluate this very friendly patient. If you have any question regarding her care or prognosis, do not hesitate to ask.   New Prescriptions   MELOXICAM (MOBIC) 15 MG TABLET    Take 1 tablet (15 mg total) by mouth daily.   Orders Placed This Encounter  Procedures  . DG Knee Bilateral Standing AP  . DG Knee 1-2 Views Right    Signed,  Frederico Hamman T. Nemiah Kissner, MD, [place]   Patient's Medications  New Prescriptions   MELOXICAM (MOBIC) 15 MG TABLET    Take 1 tablet (15 mg total) by mouth daily.  Previous Medications   ASPIRIN EC 81 MG TABLET    Take 81  mg by mouth daily.   ATORVASTATIN (LIPITOR) 80 MG TABLET    TAKE 1 TABLET (80 MG TOTAL) BY MOUTH DAILY.   BUPROPION (WELLBUTRIN) 75 MG TABLET    Take 150 mg by mouth 2 (two) times daily.    CHLORTHALIDONE (HYGROTON) 25 MG TABLET    TAKE 1 TABLET (25 MG TOTAL) BY MOUTH DAILY.   CHOLECALCIFEROL (VITAMIN D) 1000 UNITS TABLET    Take 2,000 Units by mouth daily.   DOCUSATE SODIUM (COLACE) 100 MG CAPSULE    Take 100 mg by mouth 3 (three) times daily.    GABAPENTIN (NEURONTIN) 100 MG CAPSULE    TAKE 1-2 CAPSULES AT BEDTIME AS NEEDED. PLEASE STOP TRAMADOL   IRON PO    Take 45 mg by mouth daily.    KLOR-CON M10 10 MEQ TABLET    TAKE 1 TABLET BY MOUTH DAILY.   MAGNESIUM OXIDE (MAG-OX) 400 (241.3 MG) MG TABLET    TAKE 1 TABLET (400 MG TOTAL) BY MOUTH DAILY. PROD NOT COVERED   PANTOPRAZOLE (PROTONIX) 40 MG TABLET    TAKE 1 TABLET (40 MG TOTAL) BY MOUTH DAILY.   SERTRALINE (ZOLOFT) 100 MG TABLET    Take 150 mg by mouth every morning.  VALSARTAN (DIOVAN) 320 MG TABLET    Take 1 tablet (320 mg total) by mouth daily.  Modified Medications   No medications on file  Discontinued Medications   No medications on file

## 2013-09-27 NOTE — Telephone Encounter (Signed)
Notify pt that her pelvic ultrasound reveals two uterine fibroids that have not increased in size since a previous ultrasound in 2006. (one has actually decreased in size).  Ovaries appear normal.  If she is having persistent pain/pressure, I would like to refer her to gyn for further evaluation.  If agreeable, let me know and I will place the order for the referral.  (if no problems, she has an appt with me in 9/15 - can discuss more with her then).  Thanks.

## 2013-09-27 NOTE — Telephone Encounter (Signed)
Left message for pt to return my call.

## 2013-09-28 ENCOUNTER — Encounter: Payer: Self-pay | Admitting: *Deleted

## 2013-09-28 NOTE — Telephone Encounter (Signed)
Letter mailed

## 2013-10-03 ENCOUNTER — Encounter: Payer: Self-pay | Admitting: Internal Medicine

## 2013-10-03 DIAGNOSIS — Z86010 Personal history of colonic polyps: Secondary | ICD-10-CM | POA: Insufficient documentation

## 2013-10-03 DIAGNOSIS — K2970 Gastritis, unspecified, without bleeding: Secondary | ICD-10-CM | POA: Insufficient documentation

## 2013-10-04 ENCOUNTER — Encounter: Payer: Self-pay | Admitting: Internal Medicine

## 2013-10-06 ENCOUNTER — Encounter: Payer: Self-pay | Admitting: Internal Medicine

## 2013-10-10 ENCOUNTER — Ambulatory Visit: Payer: Medicare Other | Admitting: Internal Medicine

## 2013-10-10 ENCOUNTER — Encounter: Payer: Self-pay | Admitting: Internal Medicine

## 2013-10-19 ENCOUNTER — Other Ambulatory Visit: Payer: Self-pay | Admitting: Internal Medicine

## 2013-10-21 ENCOUNTER — Other Ambulatory Visit: Payer: Self-pay | Admitting: Internal Medicine

## 2013-10-21 NOTE — Telephone Encounter (Signed)
Pt notified of ultrasound results.  States pain is better.  Has not had pain or pressure lately.  Desires to hold off on any further w/up.

## 2013-11-07 ENCOUNTER — Other Ambulatory Visit: Payer: Self-pay | Admitting: Internal Medicine

## 2013-11-22 ENCOUNTER — Other Ambulatory Visit: Payer: Self-pay | Admitting: Internal Medicine

## 2013-11-28 ENCOUNTER — Encounter: Payer: Self-pay | Admitting: Internal Medicine

## 2013-11-28 ENCOUNTER — Ambulatory Visit (INDEPENDENT_AMBULATORY_CARE_PROVIDER_SITE_OTHER): Payer: Medicare Other | Admitting: Internal Medicine

## 2013-11-28 VITALS — BP 150/86 | HR 76 | Temp 98.4°F | Resp 14 | Ht 66.0 in | Wt 179.2 lb

## 2013-11-28 DIAGNOSIS — K2970 Gastritis, unspecified, without bleeding: Secondary | ICD-10-CM

## 2013-11-28 DIAGNOSIS — M25561 Pain in right knee: Secondary | ICD-10-CM

## 2013-11-28 DIAGNOSIS — D649 Anemia, unspecified: Secondary | ICD-10-CM

## 2013-11-28 DIAGNOSIS — Z860100 Personal history of colon polyps, unspecified: Secondary | ICD-10-CM

## 2013-11-28 DIAGNOSIS — E7800 Pure hypercholesterolemia, unspecified: Secondary | ICD-10-CM

## 2013-11-28 DIAGNOSIS — I10 Essential (primary) hypertension: Secondary | ICD-10-CM

## 2013-11-28 DIAGNOSIS — F329 Major depressive disorder, single episode, unspecified: Secondary | ICD-10-CM

## 2013-11-28 DIAGNOSIS — Z23 Encounter for immunization: Secondary | ICD-10-CM

## 2013-11-28 DIAGNOSIS — E210 Primary hyperparathyroidism: Secondary | ICD-10-CM

## 2013-11-28 DIAGNOSIS — F32A Depression, unspecified: Secondary | ICD-10-CM

## 2013-11-28 DIAGNOSIS — F419 Anxiety disorder, unspecified: Secondary | ICD-10-CM

## 2013-11-28 MED ORDER — AMLODIPINE BESYLATE 5 MG PO TABS: 5.0000 mg | ORAL_TABLET | Freq: Every day | ORAL | Status: DC

## 2013-11-28 NOTE — Progress Notes (Signed)
Pre visit review using our clinic review tool, if applicable. No additional management support is needed unless otherwise documented below in the visit note. 

## 2013-12-03 ENCOUNTER — Encounter: Payer: Self-pay | Admitting: Internal Medicine

## 2013-12-03 NOTE — Assessment & Plan Note (Signed)
Blood pressure remains elevated.  Add amlodipine 5mg  q day.  Follow pressures.  Get her back in soon to reassess. Check metabolic panel.    BP Readings from Last 3 Encounters:  11/28/13 150/86  09/27/13 130/90  09/11/13 130/90

## 2013-12-03 NOTE — Assessment & Plan Note (Signed)
EGD 09/15/13 gastritis and hiatal hernia.  Currently doing well.

## 2013-12-03 NOTE — Assessment & Plan Note (Signed)
Low cholesterol diet.  Check fasting lipid profile.

## 2013-12-03 NOTE — Assessment & Plan Note (Signed)
Was seeing Dr Nicolasa Ducking.  On Wellbutrin and Sertraline now.  Doing better.

## 2013-12-03 NOTE — Assessment & Plan Note (Signed)
S/p parathyroid surgery.  Doing well.  Continue to follow up with Dr Harlow Asa.  Last calcium check wnl.

## 2013-12-03 NOTE — Assessment & Plan Note (Signed)
Colonoscopy 09/15/13 - four rectal polyps, one small polyp in sigmoid colon and internal hemorrhoids.  Pathology - hyperplastic polyps.  Recommended f/u colonoscopy in 09/2023.

## 2013-12-03 NOTE — Assessment & Plan Note (Signed)
Hgb 13.5 - 06/2013.

## 2013-12-03 NOTE — Assessment & Plan Note (Signed)
Doing better.  Tapering medications.  Follow.

## 2013-12-03 NOTE — Progress Notes (Signed)
Subjective:    Patient ID: [name], female    DOB: February 05, 1946, 68 y.o.   MRN: [id number]  Hypertension  68 year old female with past history of hypercholesterolemia, hypertension, GERD and migraine headaches.  She comes in today for a scheduled follow up.  She is s/p parathyroid surgery 08/25/12.  Eating and drinking ok.  Appetite is good.  No vomiting.  Lower extremity swelling improved/resolved.   She had previously reported feeling sob with exertion.  See previous notes for details.   Had relatively recent cardiac w/up.  Negative.  Found to be anemic.  hgb improving.  Breathing better. No chest pain or tightness.  No cough or congfestion.  Apparently no longer seeing Dr Nicolasa Ducking.  Feels she is doing better.  Overall mood appears to be better.  Energy better.  Has been having right knee pain.  Saw Dr Lorelei Pont.  Is some better.  Request to see [facility].  Taking meloxicam.  Feels this is helping.  Blood pressure still elevated.  States on outside checks - averaging 140/90.      Past Medical History  Diagnosis Date  . Hypertension   . Anxiety   . Depression   . GERD (gastroesophageal reflux disease)   . Hypercholesterolemia   . Chronic insomnia   . COPD (chronic obstructive pulmonary disease)     documented in outside records  . Arthritis   . Heart murmur   . Osteoporosis   . Complication of anesthesia     "AFFECTS MY MEMORY"  . Shortness of breath     with exertion  . Tingling     both hands  . History of stomach ulcers   . Hyperparathyroidism, primary   . Burning feet syndrome   . Dysrhythmia     PALPITATIONS     Current Outpatient Prescriptions on File Prior to Visit  Medication Sig Dispense Refill  . aspirin EC 81 MG tablet Take 81 mg by mouth daily.      Marland Kitchen atorvastatin (LIPITOR) 80 MG tablet TAKE 1 TABLET (80 MG TOTAL) BY MOUTH DAILY.  30 tablet  5  . buPROPion (WELLBUTRIN) 75 MG tablet Take 150 mg by mouth 2 (two) times daily.       . chlorthalidone (HYGROTON) 25  MG tablet TAKE 1 TABLET (25 MG TOTAL) BY MOUTH DAILY.  30 tablet  2  . cholecalciferol (VITAMIN D) 1000 UNITS tablet Take 2,000 Units by mouth daily.      Marland Kitchen docusate sodium (COLACE) 100 MG capsule Take 100 mg by mouth 3 (three) times daily.       Marland Kitchen gabapentin (NEURONTIN) 100 MG capsule TAKE 1-2 CAPSULES AT BEDTIME AS NEEDED. PLEASE STOP TRAMADOL  60 capsule  5  . IRON PO Take 45 mg by mouth daily.       Marland Kitchen KLOR-CON M10 10 MEQ tablet TAKE 1 TABLET BY MOUTH DAILY.  30 tablet  5  . magnesium oxide (MAG-OX) 400 (241.3 MG) MG tablet TAKE 1 TABLET (400 MG TOTAL) BY MOUTH DAILY. PROD NOT COVERED  30 tablet  5  . meloxicam (MOBIC) 15 MG tablet Take 1 tablet (15 mg total) by mouth daily.  30 tablet  3  . pantoprazole (PROTONIX) 40 MG tablet TAKE 1 TABLET (40 MG TOTAL) BY MOUTH DAILY.  90 tablet  1  . sertraline (ZOLOFT) 100 MG tablet Take 150 mg by mouth every morning.       . valsartan (DIOVAN) 320 MG tablet TAKE 1 TABLET (320  MG TOTAL) BY MOUTH DAILY.  30 tablet  2  . [DISCONTINUED] potassium chloride (K-DUR) 10 MEQ tablet Take 1 tablet (10 mEq total) by mouth daily.  30 tablet  5   No current facility-administered medications on file prior to visit.    Review of Systems Patient denies any headache, lightheadedness or dizziness.  No sinus or allergy symptoms currently.  No chest pain, tightness or palpitations.   No sob reported.   No cough or congestion.  Previously saw Dr Raul Del.  No nausea or vomiting.  No abdominal pain or cramping.  No diarrhea, BRBPR or melana.  No urine change.   Increased stress and anxiety.  Was seeing Dr Nicolasa Ducking.  On zoloft and wellbutrin.  Better.  Right knee pain as outlined.        Objective:   Physical Exam  Filed Vitals:   11/28/13 1154  BP: 150/86  Pulse: 76  Temp: 98.4 F (36.9 C)  Resp: 14   Blood pressure recheck: 148-150/84, pulse 42  68 year old female in no acute distress.   HEENT:  Nares- clear.  Oropharynx - without lesions. NECK:  Supple.   Nontender.  No audible bruit.  HEART:  Appears to be regular. LUNGS:  No crackles or wheezing audible.  Respirations even and unlabored.  RADIAL PULSE:  Equal bilaterally.   ABDOMEN:  Soft, nontender.  Bowel sounds present and normal.  No audible abdominal bruit.    EXTREMITIES:  No increased edema present.  Resolved.  DP pulses palpable and equal bilaterally.    MSK:  Right knee pain as outlined.  Improved.  Minimal soft tissue swelling.        Assessment & Plan:  LOWER EXTREMITY EDEMA.  Resolved.         CARDIOVASCULAR.  Recent ECHO revealed mild aortic sclerosis, mild TR and mild elevated pulmonary artery pressure.  She saw cardiology.  Stress test 08/23/12 - negative for ischemia.  Currently stable.        MENOPAUSAL SYNDROME.  Doing well off Prempro.  Follow.   HEALTH MAINTENANCE.  Physical 08/18/13 .   Mammo 09/18/13 - BiRADS I.   Saw GI 08/17/13.  EGD and colonoscopy 09/15/13.     Problem List Items Addressed This Visit   Anemia     Hgb 13.5 - 06/2013.       Anxiety     Doing better.  Tapering medications.  Follow.      Depression     Was seeing Dr Nicolasa Ducking.  On Wellbutrin and Sertraline now.  Doing better.       Gastritis     EGD 09/15/13 gastritis and hiatal hernia.  Currently doing well.       History of colonic polyps     Colonoscopy 09/15/13 - four rectal polyps, one small polyp in sigmoid colon and internal hemorrhoids.  Pathology - hyperplastic polyps.  Recommended f/u colonoscopy in 09/2023.       Hypercholesterolemia     Low cholesterol diet.  Check fasting lipid profile.       Relevant Medications      amLODIpine (NORVASC) tablet   Hyperparathyroidism, primary     S/p parathyroid surgery.  Doing well.  Continue to follow up with Dr Harlow Asa.  Last calcium check wnl.      Hypertension      Blood pressure remains elevated.  Add amlodipine 5mg  q day.  Follow pressures.  Get her back in soon to reassess. Check metabolic panel.  BP Readings from Last 3 Encounters:   11/28/13 150/86  09/27/13 130/90  09/11/13 130/90      Relevant Medications      amLODIpine (NORVASC) tablet   Right knee pain     Right knee pain.  Better.  Still pain.   Request referral to [facility].        Relevant Orders      Ambulatory referral to Orthopedic Surgery    Other Visit Diagnoses   Need for prophylactic vaccination and inoculation against influenza    -  Primary    Relevant Orders       Flu Vaccine QUAD 36+ mos PF IM (Fluarix Quad PF) (Completed)      I spent 25 minutes with the patient and more than 50% of the time was spent in consultation regarding the above.

## 2013-12-03 NOTE — Assessment & Plan Note (Signed)
Right knee pain.  Better.  Still pain.   Request referral to [facility].

## 2013-12-08 ENCOUNTER — Telehealth: Payer: Self-pay | Admitting: *Deleted

## 2013-12-08 DIAGNOSIS — E7800 Pure hypercholesterolemia, unspecified: Secondary | ICD-10-CM

## 2013-12-08 DIAGNOSIS — R739 Hyperglycemia, unspecified: Secondary | ICD-10-CM

## 2013-12-08 DIAGNOSIS — I10 Essential (primary) hypertension: Secondary | ICD-10-CM

## 2013-12-08 NOTE — Telephone Encounter (Signed)
Pt is coming 11.02.2015 what labs and dx?

## 2013-12-11 ENCOUNTER — Other Ambulatory Visit (INDEPENDENT_AMBULATORY_CARE_PROVIDER_SITE_OTHER): Payer: Medicare Other

## 2013-12-11 DIAGNOSIS — E7800 Pure hypercholesterolemia, unspecified: Secondary | ICD-10-CM

## 2013-12-11 DIAGNOSIS — R739 Hyperglycemia, unspecified: Secondary | ICD-10-CM

## 2013-12-11 DIAGNOSIS — I10 Essential (primary) hypertension: Secondary | ICD-10-CM

## 2013-12-11 LAB — HEPATIC FUNCTION PANEL
ALT: 24 U/L (ref 0–35)
AST: 23 U/L (ref 0–37)
Albumin: 3.7 g/dL (ref 3.5–5.2)
Alkaline Phosphatase: 90 U/L (ref 39–117)
BILIRUBIN TOTAL: 0.8 mg/dL (ref 0.2–1.2)
Bilirubin, Direct: 0.1 mg/dL (ref 0.0–0.3)
TOTAL PROTEIN: 7.3 g/dL (ref 6.0–8.3)

## 2013-12-11 LAB — BASIC METABOLIC PANEL
BUN: 23 mg/dL (ref 6–23)
CO2: 28 mEq/L (ref 19–32)
Calcium: 9.3 mg/dL (ref 8.4–10.5)
Chloride: 100 mEq/L (ref 96–112)
Creatinine, Ser: 0.8 mg/dL (ref 0.4–1.2)
GFR: 73.97 mL/min (ref >60.00)
Glucose, Bld: 99 mg/dL (ref 70–99)
Potassium: 3.9 mEq/L (ref 3.5–5.1)
Sodium: 138 mEq/L (ref 135–145)

## 2013-12-11 LAB — LIPID PANEL
Cholesterol: 172 mg/dL (ref 0–200)
HDL: 54 mg/dL (ref >39.00)
LDL Cholesterol: 87 mg/dL (ref 0–99)
NonHDL: 118
Total CHOL/HDL Ratio: 3
Triglycerides: 153 mg/dL — ABNORMAL HIGH (ref 0.0–149.0)
VLDL: 30.6 mg/dL (ref 0.0–40.0)

## 2013-12-11 LAB — HEMOGLOBIN A1C: HEMOGLOBIN A1C: 5.8 % (ref 4.6–6.5)

## 2013-12-11 NOTE — Telephone Encounter (Signed)
Orders placed for labs

## 2013-12-12 ENCOUNTER — Encounter: Payer: Self-pay | Admitting: *Deleted

## 2014-01-03 ENCOUNTER — Ambulatory Visit: Payer: Medicare Other | Admitting: Internal Medicine

## 2014-01-03 ENCOUNTER — Telehealth: Payer: Self-pay | Admitting: Internal Medicine

## 2014-01-03 NOTE — Telephone Encounter (Signed)
Please advise 

## 2014-01-03 NOTE — Telephone Encounter (Signed)
Please schedule her at 1:30 on 01/24/14.  Thanks.  (30 min)

## 2014-01-03 NOTE — Telephone Encounter (Signed)
[name] arrived for her appt 24mins late and scheduled another appt. She's wondering if she can be seen before her appt in Feb. Please call the pt. Thank you.

## 2014-01-23 ENCOUNTER — Other Ambulatory Visit: Payer: Self-pay | Admitting: Family Medicine

## 2014-01-23 NOTE — Telephone Encounter (Signed)
Refilled meloxicam #30 with no refills.  Has appt tomorrow.  Will discuss.

## 2014-01-24 ENCOUNTER — Encounter: Payer: Self-pay | Admitting: Internal Medicine

## 2014-01-24 ENCOUNTER — Ambulatory Visit (INDEPENDENT_AMBULATORY_CARE_PROVIDER_SITE_OTHER): Payer: Medicare Other | Admitting: Internal Medicine

## 2014-01-24 ENCOUNTER — Other Ambulatory Visit: Payer: Self-pay | Admitting: Internal Medicine

## 2014-01-24 VITALS — BP 140/100 | HR 72 | Temp 98.1°F | Ht 66.0 in | Wt 182.2 lb

## 2014-01-24 DIAGNOSIS — F419 Anxiety disorder, unspecified: Secondary | ICD-10-CM

## 2014-01-24 DIAGNOSIS — E7800 Pure hypercholesterolemia, unspecified: Secondary | ICD-10-CM

## 2014-01-24 DIAGNOSIS — F329 Major depressive disorder, single episode, unspecified: Secondary | ICD-10-CM

## 2014-01-24 DIAGNOSIS — F32A Depression, unspecified: Secondary | ICD-10-CM

## 2014-01-24 DIAGNOSIS — M25561 Pain in right knee: Secondary | ICD-10-CM

## 2014-01-24 DIAGNOSIS — E210 Primary hyperparathyroidism: Secondary | ICD-10-CM

## 2014-01-24 DIAGNOSIS — D649 Anemia, unspecified: Secondary | ICD-10-CM

## 2014-01-24 DIAGNOSIS — I10 Essential (primary) hypertension: Secondary | ICD-10-CM

## 2014-01-24 DIAGNOSIS — M25649 Stiffness of unspecified hand, not elsewhere classified: Secondary | ICD-10-CM

## 2014-01-24 DIAGNOSIS — M2550 Pain in unspecified joint: Secondary | ICD-10-CM

## 2014-01-24 DIAGNOSIS — Z86010 Personal history of colonic polyps: Secondary | ICD-10-CM

## 2014-01-24 LAB — SEDIMENTATION RATE: Sed Rate: 33 mm/hr — ABNORMAL HIGH (ref 0–22)

## 2014-01-24 LAB — C-REACTIVE PROTEIN: CRP: 0.5 mg/dL (ref 0.5–20.0)

## 2014-01-24 MED ORDER — MUPIROCIN CALCIUM 2 % EX CREA: 1.0000 "application " | TOPICAL_CREAM | Freq: Two times a day (BID) | CUTANEOUS | Status: DC

## 2014-01-24 NOTE — Progress Notes (Signed)
Pre visit review using our clinic review tool, if applicable. No additional management support is needed unless otherwise documented below in the visit note. 

## 2014-01-24 NOTE — Progress Notes (Signed)
Subjective:    Patient ID: [name], female    DOB: 11/24/1945, 68 y.o.   MRN: [id number]  Hypertension  68 year old female with past history of hypercholesterolemia, hypertension, GERD and migraine headaches.  She comes in today for a scheduled follow up.  She is s/p parathyroid surgery 08/25/12.  Eating and drinking ok.  Appetite is good.  No vomiting.  Lower extremity swelling improved/resolved.   States will swell some intermittently.  She had previously reported feeling sob with exertion.  See previous notes for details.   Had relatively recent cardiac w/up.  Negative.  Found to be anemic.  hgb improved.  Breathing better. No chest pain or tightness.  No cough or congestion.  Apparently no longer seeing Dr Nicolasa Ducking.  Feels she is doing better.  She has tapered off her medication.  On no medication now for her mood or stress.  Has been having right knee pain.  Saw Dr Lorelei Pont.  Now seeing ortho.  Had second injection yesterday.   She is having problems with various joint stiffness.  Hard to open jars and bottles.  Hands stiff.  Increased pain in her left first finger.   On meloxicam.     Past Medical History  Diagnosis Date  . Hypertension   . Anxiety   . Depression   . GERD (gastroesophageal reflux disease)   . Hypercholesterolemia   . Chronic insomnia   . COPD (chronic obstructive pulmonary disease)     documented in outside records  . Arthritis   . Heart murmur   . Osteoporosis   . Complication of anesthesia     "AFFECTS MY MEMORY"  . Shortness of breath     with exertion  . Tingling     both hands  . History of stomach ulcers   . Hyperparathyroidism, primary   . Burning feet syndrome   . Dysrhythmia     PALPITATIONS     Current Outpatient Prescriptions on File Prior to Visit  Medication Sig Dispense Refill  . amLODipine (NORVASC) 5 MG tablet Take 1 tablet (5 mg total) by mouth daily. 30 tablet 2  . aspirin EC 81 MG tablet Take 81 mg by mouth daily.    Marland Kitchen  atorvastatin (LIPITOR) 80 MG tablet TAKE 1 TABLET (80 MG TOTAL) BY MOUTH DAILY. 30 tablet 5  . buPROPion (WELLBUTRIN) 75 MG tablet Take 150 mg by mouth 2 (two) times daily.     . chlorthalidone (HYGROTON) 25 MG tablet TAKE 1 TABLET (25 MG TOTAL) BY MOUTH DAILY. 30 tablet 2  . cholecalciferol (VITAMIN D) 1000 UNITS tablet Take 2,000 Units by mouth daily.    Marland Kitchen docusate sodium (COLACE) 100 MG capsule Take 100 mg by mouth 3 (three) times daily.     Marland Kitchen gabapentin (NEURONTIN) 100 MG capsule TAKE 1-2 CAPSULES AT BEDTIME AS NEEDED. PLEASE STOP TRAMADOL 60 capsule 5  . IRON PO Take 45 mg by mouth daily.     . magnesium oxide (MAG-OX) 400 (241.3 MG) MG tablet TAKE 1 TABLET (400 MG TOTAL) BY MOUTH DAILY. PROD NOT COVERED 30 tablet 5  . meloxicam (MOBIC) 15 MG tablet TAKE 1 TABLET (15 MG TOTAL) BY MOUTH DAILY. 30 tablet 0  . pantoprazole (PROTONIX) 40 MG tablet TAKE 1 TABLET (40 MG TOTAL) BY MOUTH DAILY. 90 tablet 1  . sertraline (ZOLOFT) 100 MG tablet Take 150 mg by mouth every morning.     . valsartan (DIOVAN) 320 MG tablet TAKE 1 TABLET (320 MG  TOTAL) BY MOUTH DAILY. 30 tablet 2  . [DISCONTINUED] potassium chloride (K-DUR) 10 MEQ tablet Take 1 tablet (10 mEq total) by mouth daily. 30 tablet 5   No current facility-administered medications on file prior to visit.    Review of Systems Patient denies any headache, lightheadedness or dizziness.  No sinus or allergy symptoms currently.  No chest pain, tightness or palpitations.   No cough or congestion.  Previously saw Dr Raul Del.  No nausea or vomiting.  No abdominal pain or cramping.  No diarrhea, BRBPR or melana.  No urine change.   Increased stress and anxiety.  Was seeing Dr Nicolasa Ducking.  Off zoloft and wellbutrin.  She tapered herself off.   Right knee pain as outlined.  Seeing ortho.  Joint stiffness as outlined.        Objective:   Physical Exam  Filed Vitals:   01/24/14 1339  BP: 140/100  Pulse: 72  Temp: 98.1 F (36.7 C)   Blood pressure recheck:  41/40  68 year old female in no acute distress.   HEENT:  Nares- clear.  Oropharynx - without lesions. NECK:  Supple.  Nontender.  No audible bruit.  HEART:  Appears to be regular. LUNGS:  No crackles or wheezing audible.  Respirations even and unlabored.  RADIAL PULSE:  Equal bilaterally.   ABDOMEN:  Soft, nontender.  Bowel sounds present and normal.  No audible abdominal bruit.    EXTREMITIES:  No increased edema present.  Resolved.  DP pulses palpable and equal bilaterally.    MSK:  Increased pain - left index finger.  Increased stiffness - hands.        Assessment & Plan:  1. Joint stiffness of hand, unspecified laterality Increased stiffness and pain as outlined.  Stretches and exercises.  - ANA; Future - Sedimentation rate - C-reactive protein - Rheumatoid factor - ANA  2. Essential hypertension Blood pressure as outlined.  Have her monitor her pressure.  Get her back in soon to reassess.    3. Hyperparathyroidism, primary S/p parathyroid surgery.  Doing well.  Continue to f/u with Dr Harlow Asa.    4. Hypercholesterolemia Low cholesterol diet and exercise.  On lipitor.  Follow lipid panel and liver function.   Lab Results  Component Value Date   CHOL 172 12/11/2013   HDL 54.00 12/11/2013   LDLCALC 87 12/11/2013   TRIG 153.0* 12/11/2013   CHOLHDL 3 12/11/2013   5. Depression Feels she is doing well.  Off her medication.  No longer seeing Dr Nicolasa Ducking.  Follow.   6. Anxiety Feels she is doing well.  On no medication.  Follow.    7. Anemia, unspecified anemia type Hgb wnl 08/28/13.    8. Right knee pain Seeing ortho.    9. History of colonic polyps Just had colonoscopy 09/25/13.    10. Joint pain See above.  Check esr, RF, ANA and CRP.  Further w/up pending.    11. LOWER EXTREMITY EDEMA.  Improved.          12. CARDIOVASCULAR.  Recent ECHO revealed mild aortic sclerosis, mild TR and mild elevated pulmonary artery pressure.  She saw cardiology.  Stress test 08/23/12  - negative for ischemia.  Currently stable.        13. MENOPAUSAL SYNDROME.  Doing well off Prempro.  Follow.   HEALTH MAINTENANCE.  Physical 08/18/13 .   Mammo 09/18/13 - BiRADS I.   Saw GI 08/17/13.  EGD and colonoscopy 09/15/13.    I spent 25  minutes with the patient and more than 50% of the time was spent in consultation regarding the above.

## 2014-01-25 ENCOUNTER — Telehealth: Payer: Self-pay | Admitting: Internal Medicine

## 2014-01-25 ENCOUNTER — Other Ambulatory Visit: Payer: Self-pay | Admitting: *Deleted

## 2014-01-25 ENCOUNTER — Encounter: Payer: Self-pay | Admitting: *Deleted

## 2014-01-25 LAB — RHEUMATOID FACTOR: Rheumatoid fact SerPl-aCnc: <10 [IU]/mL

## 2014-01-25 LAB — ANA: Anti Nuclear Antibody(ANA): NEGATIVE

## 2014-01-25 MED ORDER — ATORVASTATIN CALCIUM 80 MG PO TABS: 80.0000 mg | ORAL_TABLET | Freq: Once | ORAL | Status: DC

## 2014-01-25 MED ORDER — ATORVASTATIN CALCIUM 40 MG PO TABS: 80.0000 mg | ORAL_TABLET | Freq: Once | ORAL | Status: DC

## 2014-01-25 NOTE — Telephone Encounter (Signed)
A refill request was sent through on this pt for lipitor 80mg  q day.  Please notify pt that I would like to decrease her lipitor back to 40mg  q day and see if this helps the aching she is experiencing.  Will follow her cholesterol.  Call in 40mg  of lipitor q day.  Thanks.

## 2014-01-25 NOTE — Telephone Encounter (Signed)
rx changed.  Spoke with pt, pt verbalized understanding

## 2014-01-26 ENCOUNTER — Other Ambulatory Visit: Payer: Self-pay | Admitting: *Deleted

## 2014-01-26 MED ORDER — AMLODIPINE BESYLATE 5 MG PO TABS: 5.0000 mg | ORAL_TABLET | Freq: Every day | ORAL | Status: DC

## 2014-01-26 MED ORDER — POTASSIUM CHLORIDE CRYS ER 10 MEQ PO TBCR: 10.0000 meq | EXTENDED_RELEASE_TABLET | Freq: Every day | ORAL | Status: DC

## 2014-01-26 MED ORDER — VALSARTAN 320 MG PO TABS: ORAL_TABLET | ORAL | Status: DC

## 2014-01-26 MED ORDER — CHLORTHALIDONE 25 MG PO TABS: ORAL_TABLET | ORAL | Status: DC

## 2014-01-26 MED ORDER — GABAPENTIN 100 MG PO CAPS
ORAL_CAPSULE | ORAL | Status: DC
Start: 2014-01-26 — End: 2014-06-19

## 2014-01-26 NOTE — Addendum Note (Signed)
Addended by: Wynonia Lawman E on: 01/26/2014 03:29 PM   Modules accepted: Orders

## 2014-01-26 NOTE — Telephone Encounter (Signed)
Fax from pharmacy, requesting 90 day refills. Rx sent to pharmacy by escript

## 2014-01-28 ENCOUNTER — Encounter: Payer: Self-pay | Admitting: Internal Medicine

## 2014-01-28 DIAGNOSIS — M2550 Pain in unspecified joint: Secondary | ICD-10-CM | POA: Insufficient documentation

## 2014-02-16 ENCOUNTER — Other Ambulatory Visit: Payer: Self-pay

## 2014-02-16 MED ORDER — ATORVASTATIN CALCIUM 40 MG PO TABS: 40.0000 mg | ORAL_TABLET | Freq: Once | ORAL | Status: DC

## 2014-02-16 NOTE — Telephone Encounter (Signed)
Rx sent to the pharmacy for 80mg  daily in error on 01/25/14. Pharmacy called for clarification. Rx was changed to 40mg  daily as ordered by Dr. Nicki Reaper. Patient was breaking the 80mg  in half.

## 2014-03-13 ENCOUNTER — Ambulatory Visit (INDEPENDENT_AMBULATORY_CARE_PROVIDER_SITE_OTHER): Payer: Medicare Other | Admitting: Podiatry

## 2014-03-13 ENCOUNTER — Encounter: Payer: Self-pay | Admitting: Podiatry

## 2014-03-13 VITALS — BP 134/81 | HR 75 | Resp 18

## 2014-03-13 DIAGNOSIS — M79673 Pain in unspecified foot: Secondary | ICD-10-CM

## 2014-03-13 DIAGNOSIS — G629 Polyneuropathy, unspecified: Secondary | ICD-10-CM

## 2014-03-13 DIAGNOSIS — B351 Tinea unguium: Secondary | ICD-10-CM

## 2014-03-13 DIAGNOSIS — M7740 Metatarsalgia, unspecified foot: Secondary | ICD-10-CM

## 2014-03-13 NOTE — Progress Notes (Signed)
   Subjective:    Patient ID: [name], female    DOB: 22-Aug-1945, 69 y.o.   MRN: [id number]  HPI  69 year old female presents the office today with complaints of bilateral foot pain. She states that she has a [place] burning sensation to her feet. She states that she has a sensation to her entire leg for which she points to her hip going down to her foot. She also states that she has pain in the ball of her feet for which she points mostly over the second submetatarsal SBilaterally. he denies any history of injury or trauma to the area. She denies any recent swelling or redness overlying the area. Her pain is intermittent in nature not continuous. She states that she does have a history of a total hip replacement as well as neck surgery.  She also states that she has back pain which has been ongoing. She has previously been placed on gabapentin 200 mg at nighttime. she also states that her nails are thick and brittle and she is unable to trim them herself. She denies any recent redness or drainage from the nail sites. No other complaints at this time.    Review of Systems  Constitutional: Positive for fatigue and unexpected weight change.  HENT: Positive for hearing loss.        RINGING IN EARS  Eyes: Positive for visual disturbance.  Musculoskeletal: Positive for back pain and gait problem.       JOINT PAIN  Skin:       CHANGE IN NAILS  Neurological: Positive for dizziness and weakness.  Hematological: Bruises/bleeds easily.  Psychiatric/Behavioral: The patient is nervous/anxious.   All other systems reviewed and are negative.      Objective:   Physical Exam AAO x3, NAD DP/PT pulses palpable b/l, CRT < 3 sec; varicosities noted bilaterally  Protective sensation intact with SWMF, vibratory sensation intact, achilles tendon reflex intact. There is subjective sharp/buring pains to the bilateral feet.  There is diffuse tenderness overlying metatarsal heads plantarly with  prominent metatarsal heads. The majority does seem to be localized overlying the second metatarsal head. There is no area pinpoint bony tenderness or pain with vibratory sensation. There is no pain with MTPJ range of motion. No overlying edema, erythema, increased warmth.  There is no other areas of tenderness to bilateral lower extremities and there is no overlying edema, erythema, increase in warmth bilaterally. MMT 5/5, ROM WNL No open lesions or pre-ulcerative lesions Nails are hypertrophic, dystrophic, discolored, brittle 10. No surrounding erythema or drainage from the nail sites. No pain with calf compression, swelling, warmth, erythema.      Assessment & Plan:   69 year old female with likely early neuropathy signs; metatarsalgia; onychomycosis -X-rays were obtained and reviewed with the patient. -Treatment options were discussed the patient including alternatives, risks, complications. -Discussed likely etiology patient symptoms in detail. -Nail sharply debrided 10 without complication/bleeding. -Dispensed metatarsal pads to help offload the metatarsal heads. Discussed shoe gear modifications.  -Recommended her to follow-up with her primary care physician in regards to the neuropathy gabapentin.  -Follow-up in 3 months or sooner should any problems arise. In the meantime, encouraged call the office with any questions, concerns, change in symptoms. Follow-up with PCP for other issues mentioned in the review of systems.

## 2014-03-16 ENCOUNTER — Encounter: Payer: Self-pay | Admitting: Internal Medicine

## 2014-03-16 ENCOUNTER — Ambulatory Visit (INDEPENDENT_AMBULATORY_CARE_PROVIDER_SITE_OTHER): Payer: Medicare Other | Admitting: Internal Medicine

## 2014-03-16 VITALS — BP 130/80 | HR 59 | Temp 98.0°F | Ht 66.0 in | Wt 178.2 lb

## 2014-03-16 DIAGNOSIS — F419 Anxiety disorder, unspecified: Secondary | ICD-10-CM

## 2014-03-16 DIAGNOSIS — G629 Polyneuropathy, unspecified: Secondary | ICD-10-CM

## 2014-03-16 DIAGNOSIS — E7800 Pure hypercholesterolemia, unspecified: Secondary | ICD-10-CM

## 2014-03-16 DIAGNOSIS — Z86010 Personal history of colonic polyps: Secondary | ICD-10-CM

## 2014-03-16 DIAGNOSIS — F329 Major depressive disorder, single episode, unspecified: Secondary | ICD-10-CM

## 2014-03-16 DIAGNOSIS — E210 Primary hyperparathyroidism: Secondary | ICD-10-CM

## 2014-03-16 DIAGNOSIS — F32A Depression, unspecified: Secondary | ICD-10-CM

## 2014-03-16 DIAGNOSIS — I10 Essential (primary) hypertension: Secondary | ICD-10-CM

## 2014-03-16 MED ORDER — PANTOPRAZOLE SODIUM 40 MG PO TBEC
40.0000 mg | DELAYED_RELEASE_TABLET | Freq: Every day | ORAL | Status: DC
Start: 2014-03-16 — End: 2014-08-16

## 2014-03-16 NOTE — Progress Notes (Signed)
Pre visit review using our clinic review tool, if applicable. No additional management support is needed unless otherwise documented below in the visit note. 

## 2014-03-18 ENCOUNTER — Encounter: Payer: Self-pay | Admitting: Internal Medicine

## 2014-03-18 DIAGNOSIS — G629 Polyneuropathy, unspecified: Secondary | ICD-10-CM | POA: Insufficient documentation

## 2014-03-18 NOTE — Assessment & Plan Note (Signed)
Increased pain.  Refer to neurology for formal evaluation and NCS.

## 2014-03-18 NOTE — Assessment & Plan Note (Signed)
Stable.  Follow.  No longer seeing psychiatry.

## 2014-03-18 NOTE — Assessment & Plan Note (Signed)
Blood pressure as outlined.  Same medication regimen.  Follow pressures.  Follow metabolic panel.  

## 2014-03-18 NOTE — Assessment & Plan Note (Signed)
Stable.  Follow.   

## 2014-03-18 NOTE — Progress Notes (Signed)
Patient ID: [name], female   DOB: 1945/05/08, 69 y.o.   MRN: [id number]   Subjective:    Patient ID: [name], female    DOB: 1945/11/15, 69 y.o.   MRN: [id number]  HPI  Patient here for a scheduled follow up.  Her back is better with exercise.  Having foot and leg pain.  Feet feel like she is walking on coals.  Does extend up her leg.  Was seen at [facility].  Limits her exercise.  States her blood pressure has been ok.     Past Medical History  Diagnosis Date  . Hypertension   . Anxiety   . Depression   . GERD (gastroesophageal reflux disease)   . Hypercholesterolemia   . Chronic insomnia   . COPD (chronic obstructive pulmonary disease)     documented in outside records  . Arthritis   . Heart murmur   . Osteoporosis   . Complication of anesthesia     "AFFECTS MY MEMORY"  . Shortness of breath     with exertion  . Tingling     both hands  . History of stomach ulcers   . Hyperparathyroidism, primary   . Burning feet syndrome   . Dysrhythmia     PALPITATIONS     Outpatient Encounter Prescriptions as of 03/16/2014  Medication Sig  . amLODipine (NORVASC) 5 MG tablet Take 1 tablet (5 mg total) by mouth daily.  Marland Kitchen aspirin EC 81 MG tablet Take 81 mg by mouth daily.  Marland Kitchen atorvastatin (LIPITOR) 40 MG tablet Take 1 tablet (40 mg total) by mouth once.  . Biotin 10 MG CAPS Take by mouth.  . chlorthalidone (HYGROTON) 25 MG tablet TAKE 1 TABLET (25 MG TOTAL) BY MOUTH DAILY.  . cholecalciferol (VITAMIN D) 1000 UNITS tablet Take 2,000 Units by mouth daily.  Marland Kitchen docusate sodium (COLACE) 100 MG capsule Take 100 mg by mouth 3 (three) times daily.   Marland Kitchen gabapentin (NEURONTIN) 100 MG capsule TAKE 1-2 CAPSULES AT BEDTIME AS NEEDED. PLEASE STOP TRAMADOL  . IRON PO Take 45 mg by mouth daily.   . magnesium oxide (MAG-OX) 400 (241.3 MG) MG tablet TAKE 1 TABLET (400 MG TOTAL) BY MOUTH DAILY. PROD NOT COVERED  . meloxicam (MOBIC) 15 MG tablet TAKE 1 TABLET (15 MG TOTAL) BY  MOUTH DAILY. (Patient taking differently: TAKE 1/2 TABLET (7.5MG  TOTAL) BY MOUTH DAILY.)  . mupirocin cream (BACTROBAN) 2 % Apply 1 application topically 2 (two) times daily.  . mupirocin cream (BACTROBAN) 2 % 2 (two) times daily.  . Omega-3 Fatty Acids (OMEGA 3 PO) Take by mouth.  . pantoprazole (PROTONIX) 40 MG tablet Take 1 tablet (40 mg total) by mouth daily.  . potassium chloride (KLOR-CON M10) 10 MEQ tablet Take 1 tablet (10 mEq total) by mouth daily.  . valsartan (DIOVAN) 320 MG tablet TAKE 1 TABLET (320 MG TOTAL) BY MOUTH DAILY.  . [DISCONTINUED] pantoprazole (PROTONIX) 40 MG tablet TAKE 1 TABLET (40 MG TOTAL) BY MOUTH DAILY.  . [DISCONTINUED] atorvastatin (LIPITOR) 80 MG tablet     Review of Systems  Constitutional: Negative for fatigue and unexpected weight change.  HENT: Negative for congestion and sinus pressure.   Respiratory: Negative for cough, chest tightness and shortness of breath.   Cardiovascular: Negative for chest pain and palpitations.  Gastrointestinal: Negative for nausea, vomiting, abdominal pain, diarrhea and constipation.  Musculoskeletal: Positive for back pain.       Foot pain as outlined.  Feels like  she is walking on coals.  Extends up her leg.    Skin: Negative for color change and rash.  Neurological: Negative for dizziness, light-headedness and headaches.       Objective:    Physical Exam  HENT:  Nose: Nose normal.  Mouth/Throat: Oropharynx is clear and moist.  Neck: Neck supple. No thyromegaly present.  Cardiovascular: Normal rate and regular rhythm.   Pulmonary/Chest: Breath sounds normal. No respiratory distress. She has no wheezes.  Abdominal: Soft. Bowel sounds are normal. There is no tenderness.  Musculoskeletal: She exhibits no edema or tenderness.  Lymphadenopathy:    She has no cervical adenopathy.  Neurological:  Decreased sensation to pinprick bilateral feet.    Skin: No rash noted. No erythema.    BP 130/80 mmHg  Pulse 59   Temp(Src) 98 F (36.7 C) (Oral)  Ht 5\' 6"  (1.676 m)  Wt 178 lb 4 oz (80.854 kg)  BMI 28.78 kg/m2  SpO2 96% Wt Readings from Last 3 Encounters:  03/16/14 178 lb 4 oz (80.854 kg)  01/24/14 182 lb 4 oz (82.668 kg)  11/28/13 179 lb 4 oz (81.307 kg)     Lab Results  Component Value Date   WBC 8.8 08/28/2013   HGB 14.3 08/28/2013   HCT 42.9 08/28/2013   PLT 280.0 08/28/2013   GLUCOSE 99 12/11/2013   CHOL 172 12/11/2013   TRIG 153.0* 12/11/2013   HDL 54.00 12/11/2013   LDLCALC 87 12/11/2013   ALT 24 12/11/2013   AST 23 12/11/2013   NA 138 12/11/2013   K 3.9 12/11/2013   CL 100 12/11/2013   CREATININE 0.8 12/11/2013   BUN 23 12/11/2013   CO2 28 12/11/2013   TSH 0.65 08/28/2013   HGBA1C 5.8 12/11/2013    Dg Knee 1-2 Views Right  09/27/2013   CLINICAL DATA:  Knee pain, right greater than left.  EXAM: BILATERAL KNEES STANDING - 1 VIEW; RIGHT KNEE - 1-2 VIEW  COMPARISON:  None.  FINDINGS: Standing frontal view of both knees shows loss of medial joint space in the right knee with trace rightward subluxation of the femur relative the tibial plateau on the right. Small hypertrophic spurs are seen in the medial and patellofemoral compartments of the right knee.  IMPRESSION: Degenerative changes in the right knee.   Electronically Signed   By: Misty Stanley M.D.   On: 09/27/2013 14:24   Dg Knee Bilateral Standing Ap  09/27/2013   CLINICAL DATA:  Knee pain, right greater than left.  EXAM: BILATERAL KNEES STANDING - 1 VIEW; RIGHT KNEE - 1-2 VIEW  COMPARISON:  None.  FINDINGS: Standing frontal view of both knees shows loss of medial joint space in the right knee with trace rightward subluxation of the femur relative the tibial plateau on the right. Small hypertrophic spurs are seen in the medial and patellofemoral compartments of the right knee.  IMPRESSION: Degenerative changes in the right knee.   Electronically Signed   By: Misty Stanley M.D.   On: 09/27/2013 14:24       Assessment & Plan:    Problem List Items Addressed This Visit    Anxiety    Stable.  Follow.  No longer seeing psychiatry.        Depression    Stable.  Follow.       History of colonic polyps    Colonoscopy 09/15/13 - four polyps (rectal), one small polyp in sigmoid colon and internal hemorrhoids.  Pathology - hyperplastic polyps.  Recommended f/u  colonoscopy in 09/2023.        Hypercholesterolemia    Low cholesterol diet and exercise.  Follow lipid panel.        Hyperparathyroidism, primary    S/p parathyroid surgery.  Doing well.  Continue to f/u with Dr Harlow Asa.      Hypertension - Primary    Blood pressure as outlined.  Same medication regimen.  Follow pressures.  Follow metabolic panel.       Neuropathy    Increased pain.  Refer to neurology for formal evaluation and NCS.        Relevant Orders   Ambulatory referral to Neurology     I spent 25 minutes with the patient and more than 50% of the time was spent in consultation regarding the above.     Einar Pheasant, MD

## 2014-03-18 NOTE — Assessment & Plan Note (Signed)
S/p parathyroid surgery.  Doing well.  Continue to f/u with Dr Harlow Asa.

## 2014-03-18 NOTE — Assessment & Plan Note (Signed)
Low cholesterol diet and exercise.  Follow lipid panel.   

## 2014-03-18 NOTE — Assessment & Plan Note (Signed)
Colonoscopy 09/15/13 - four polyps (rectal), one small polyp in sigmoid colon and internal hemorrhoids.  Pathology - hyperplastic polyps.  Recommended f/u colonoscopy in 09/2023.

## 2014-04-25 ENCOUNTER — Encounter
Admit: 2014-04-25 | Disposition: A | Discharge status: Home / Self Care | Payer: Self-pay | Attending: Neurology | Admitting: Neurology

## 2014-05-02 ENCOUNTER — Ambulatory Visit: Payer: Self-pay | Admitting: Neurology

## 2014-05-11 ENCOUNTER — Encounter
Admit: 2014-05-11 | Disposition: A | Discharge status: Home / Self Care | Payer: Self-pay | Attending: Neurology | Admitting: Neurology

## 2014-05-15 DIAGNOSIS — M79672 Pain in left foot: Secondary | ICD-10-CM

## 2014-05-15 DIAGNOSIS — M79671 Pain in right foot: Secondary | ICD-10-CM | POA: Insufficient documentation

## 2014-05-17 ENCOUNTER — Encounter: Payer: Self-pay | Admitting: Internal Medicine

## 2014-05-17 ENCOUNTER — Ambulatory Visit (INDEPENDENT_AMBULATORY_CARE_PROVIDER_SITE_OTHER): Payer: Medicare Other | Admitting: Internal Medicine

## 2014-05-17 VITALS — BP 124/68 | HR 64 | Temp 97.8°F | Ht 66.0 in | Wt 176.5 lb

## 2014-05-17 DIAGNOSIS — G629 Polyneuropathy, unspecified: Secondary | ICD-10-CM

## 2014-05-17 DIAGNOSIS — F419 Anxiety disorder, unspecified: Secondary | ICD-10-CM

## 2014-05-17 DIAGNOSIS — E210 Primary hyperparathyroidism: Secondary | ICD-10-CM

## 2014-05-17 DIAGNOSIS — I10 Essential (primary) hypertension: Secondary | ICD-10-CM

## 2014-05-17 DIAGNOSIS — F32A Depression, unspecified: Secondary | ICD-10-CM

## 2014-05-17 DIAGNOSIS — Z86010 Personal history of colonic polyps: Secondary | ICD-10-CM

## 2014-05-17 DIAGNOSIS — E7800 Pure hypercholesterolemia, unspecified: Secondary | ICD-10-CM

## 2014-05-17 DIAGNOSIS — R739 Hyperglycemia, unspecified: Secondary | ICD-10-CM

## 2014-05-17 DIAGNOSIS — Z0000 Encounter for general adult medical examination without abnormal findings: Secondary | ICD-10-CM

## 2014-05-17 DIAGNOSIS — Z23 Encounter for immunization: Secondary | ICD-10-CM

## 2014-05-17 DIAGNOSIS — M25561 Pain in right knee: Secondary | ICD-10-CM

## 2014-05-17 DIAGNOSIS — F329 Major depressive disorder, single episode, unspecified: Secondary | ICD-10-CM

## 2014-05-17 NOTE — Progress Notes (Signed)
Pre visit review using our clinic review tool, if applicable. No additional management support is needed unless otherwise documented below in the visit note. 

## 2014-05-21 ENCOUNTER — Encounter: Payer: Self-pay | Admitting: Internal Medicine

## 2014-05-21 DIAGNOSIS — Z0000 Encounter for general adult medical examination without abnormal findings: Secondary | ICD-10-CM | POA: Insufficient documentation

## 2014-05-21 NOTE — Assessment & Plan Note (Signed)
Blood pressure better.  Same medication regimen.  Follow met b.

## 2014-05-21 NOTE — Progress Notes (Signed)
Patient ID: [name], female   DOB: 12/30/1945, 69 y.o.   MRN: [id number]   Subjective:    Patient ID: [name], female    DOB: 18-Apr-1945, 69 y.o.   MRN: [id number]  HPI  Patient here for a scheduled follow up.  Saw Dr Manuella Ghazi.  Gabapentin increased.  Helps.  Persistent knee pain.  Has seen ortho. S/p injections.  Request referral back given persistent pain.  Going to therapy.  Tries to stay active.  Breathing stable.     Past Medical History  Diagnosis Date  . Hypertension   . Anxiety   . Depression   . GERD (gastroesophageal reflux disease)   . Hypercholesterolemia   . Chronic insomnia   . COPD (chronic obstructive pulmonary disease)     documented in outside records  . Arthritis   . Heart murmur   . Osteoporosis   . Complication of anesthesia     "AFFECTS MY MEMORY"  . Shortness of breath     with exertion  . Tingling     both hands  . History of stomach ulcers   . Hyperparathyroidism, primary   . Burning feet syndrome   . Dysrhythmia     PALPITATIONS     Current Outpatient Prescriptions on File Prior to Visit  Medication Sig Dispense Refill  . amLODipine (NORVASC) 5 MG tablet Take 1 tablet (5 mg total) by mouth daily. 90 tablet 1  . aspirin EC 81 MG tablet Take 81 mg by mouth daily.    Marland Kitchen atorvastatin (LIPITOR) 40 MG tablet Take 1 tablet (40 mg total) by mouth once. 30 tablet 5  . Biotin 10 MG CAPS Take by mouth.    . chlorthalidone (HYGROTON) 25 MG tablet TAKE 1 TABLET (25 MG TOTAL) BY MOUTH DAILY. 90 tablet 1  . cholecalciferol (VITAMIN D) 1000 UNITS tablet Take 2,000 Units by mouth daily.    Marland Kitchen docusate sodium (COLACE) 100 MG capsule Take 100 mg by mouth 3 (three) times daily.     Marland Kitchen gabapentin (NEURONTIN) 100 MG capsule TAKE 1-2 CAPSULES AT BEDTIME AS NEEDED. PLEASE STOP TRAMADOL (Patient taking differently: 300 mg. TAKE 3 CAPSULES AT BEDTIME AS NEEDED. PLEASE STOP TRAMADOL) 180 capsule 0  . IRON PO Take 45 mg by mouth daily.     . magnesium oxide  (MAG-OX) 400 (241.3 MG) MG tablet TAKE 1 TABLET (400 MG TOTAL) BY MOUTH DAILY. PROD NOT COVERED 30 tablet 5  . meloxicam (MOBIC) 15 MG tablet TAKE 1 TABLET (15 MG TOTAL) BY MOUTH DAILY. (Patient taking differently: TAKE 1/2 TABLET (7.5MG TOTAL) BY MOUTH DAILY.) 30 tablet 0  . Omega-3 Fatty Acids (OMEGA 3 PO) Take by mouth.    . pantoprazole (PROTONIX) 40 MG tablet Take 1 tablet (40 mg total) by mouth daily. 90 tablet 1  . potassium chloride (KLOR-CON M10) 10 MEQ tablet Take 1 tablet (10 mEq total) by mouth daily. 90 tablet 1  . valsartan (DIOVAN) 320 MG tablet TAKE 1 TABLET (320 MG TOTAL) BY MOUTH DAILY. 90 tablet 1  . [DISCONTINUED] potassium chloride (K-DUR) 10 MEQ tablet Take 1 tablet (10 mEq total) by mouth daily. 30 tablet 5   No current facility-administered medications on file prior to visit.    Review of Systems  Constitutional: Negative for appetite change and unexpected weight change.  HENT: Negative for congestion and sinus pressure.   Respiratory: Negative for cough, chest tightness and shortness of breath.   Cardiovascular: Negative for chest pain, palpitations and  leg swelling.  Gastrointestinal: Negative for nausea, vomiting, abdominal pain and diarrhea.  Genitourinary: Negative for dysuria and difficulty urinating.  Musculoskeletal:       Persistent knee pain as outlined.    Skin: Negative for color change and rash.  Neurological: Negative for dizziness, light-headedness and headaches.  Psychiatric/Behavioral: Negative for dysphoric mood and agitation.       Objective:     Pulse recheck:  60  Physical Exam  Constitutional: She appears well-developed and well-nourished. No distress.  HENT:  Nose: Nose normal.  Mouth/Throat: Oropharynx is clear and moist.  Neck: Neck supple. No thyromegaly present.  Cardiovascular: Normal rate and regular rhythm.   Pulmonary/Chest: Breath sounds normal. No respiratory distress. She has no wheezes.  Abdominal: Soft. Bowel sounds are  normal. There is no tenderness.  Musculoskeletal: She exhibits no edema or tenderness.  Lymphadenopathy:    She has no cervical adenopathy.  Skin: No rash noted. No erythema.  Psychiatric: She has a normal mood and affect. Her behavior is normal.    BP 124/68 mmHg  Pulse 64  Temp(Src) 97.8 F (36.6 C) (Oral)  Ht 5' 6" (1.676 m)  Wt 176 lb 8 oz (80.06 kg)  BMI 28.50 kg/m2  SpO2 97% Wt Readings from Last 3 Encounters:  05/17/14 176 lb 8 oz (80.06 kg)  03/16/14 178 lb 4 oz (80.854 kg)  01/24/14 182 lb 4 oz (82.668 kg)     Lab Results  Component Value Date   WBC 8.8 08/28/2013   HGB 14.3 08/28/2013   HCT 42.9 08/28/2013   PLT 280.0 08/28/2013   GLUCOSE 99 12/11/2013   CHOL 172 12/11/2013   TRIG 153.0* 12/11/2013   HDL 54.00 12/11/2013   LDLCALC 87 12/11/2013   ALT 24 12/11/2013   AST 23 12/11/2013   NA 138 12/11/2013   K 3.9 12/11/2013   CL 100 12/11/2013   CREATININE 0.8 12/11/2013   BUN 23 12/11/2013   CO2 28 12/11/2013   TSH 0.65 08/28/2013   HGBA1C 5.8 12/11/2013       Assessment & Plan:   Problem List Items Addressed This Visit    Anxiety    Appears to be doing better.  Follow. No longer seeing psychiatry.       Depression    Improved.  Follow.       Health care maintenance    Physical 08/18/13.  Mammogram 09/18/13 - Birads I.  Colonoscopy 09/15/13.        History of colonic polyps    Colonoscopy 09/15/13 as outlined.  Recommended f/u colonoscopy 09/2023.       Hypercholesterolemia    On lipitor.  Low cholesterol diet and exercise.  Follow lipid panel and liver function tests.       Relevant Orders   Lipid panel   Hepatic function panel   Hyperparathyroidism, primary    S/p parathyroid surgery.  Continue f/u with Dr Harlow Asa.       Hypertension - Primary    Blood pressure better.  Same medication regimen.  Follow met b.       Relevant Orders   Basic metabolic panel   Neuropathy    Seeing neurology.  Gabapentin increased to 315m (2 q hs).   Follow.  Is helping.       Right knee pain    Persistent knee pain despite injection.  Has seen J[facility] Robert  Refer back to ortho given persistent pain.       Relevant Orders   Ambulatory  referral to Orthopedic Surgery    Other Visit Diagnoses    Need for prophylactic vaccination against Streptococcus pneumoniae (pneumococcus)        Relevant Orders    Pneumococcal conjugate vaccine 13-valent (Completed)    Hyperglycemia        Relevant Orders    Hemoglobin A1c      I spent 25 minutes with the patient and more than 50% of the time was spent in consultation regarding the above.     Einar Pheasant, MD

## 2014-05-21 NOTE — Assessment & Plan Note (Signed)
S/p parathyroid surgery.  Continue f/u with Dr Harlow Asa.

## 2014-05-21 NOTE — Assessment & Plan Note (Signed)
Colonoscopy 09/15/13 as outlined.  Recommended f/u colonoscopy 09/2023.

## 2014-05-21 NOTE — Assessment & Plan Note (Signed)
On lipitor.  Low cholesterol diet and exercise.  Follow lipid panel and liver function tests.   

## 2014-05-21 NOTE — Assessment & Plan Note (Signed)
Persistent knee pain despite injection.  Has seen [facility] Robert.  Refer back to ortho given persistent pain.

## 2014-05-21 NOTE — Assessment & Plan Note (Signed)
Seeing neurology.  Gabapentin increased to 300mg  (2 q hs).  Follow.  Is helping.

## 2014-05-21 NOTE — Assessment & Plan Note (Signed)
Physical 08/18/13.  Mammogram 09/18/13 - Birads I.  Colonoscopy 09/15/13.

## 2014-05-21 NOTE — Assessment & Plan Note (Signed)
Appears to be doing better.  Follow. No longer seeing psychiatry.

## 2014-05-21 NOTE — Assessment & Plan Note (Signed)
Improved.  Follow.  

## 2014-05-24 ENCOUNTER — Other Ambulatory Visit (INDEPENDENT_AMBULATORY_CARE_PROVIDER_SITE_OTHER): Payer: Medicare Other

## 2014-05-24 DIAGNOSIS — E7800 Pure hypercholesterolemia, unspecified: Secondary | ICD-10-CM

## 2014-05-24 DIAGNOSIS — R739 Hyperglycemia, unspecified: Secondary | ICD-10-CM | POA: Diagnosis not present

## 2014-05-24 DIAGNOSIS — I10 Essential (primary) hypertension: Secondary | ICD-10-CM | POA: Diagnosis not present

## 2014-05-24 LAB — HEPATIC FUNCTION PANEL
ALT: 31 U/L (ref 0–35)
AST: 24 U/L (ref 0–37)
Albumin: 4.1 g/dL (ref 3.5–5.2)
Alkaline Phosphatase: 95 U/L (ref 39–117)
BILIRUBIN TOTAL: 0.5 mg/dL (ref 0.2–1.2)
Bilirubin, Direct: 0.1 mg/dL (ref 0.0–0.3)
Total Protein: 6.9 g/dL (ref 6.0–8.3)

## 2014-05-24 LAB — LIPID PANEL
CHOL/HDL RATIO: 4
Cholesterol: 194 mg/dL (ref 0–200)
HDL: 51.7 mg/dL (ref >39.00)
LDL CALC: 114 mg/dL — AB (ref 0–99)
NONHDL: 142.3
Triglycerides: 144 mg/dL (ref 0.0–149.0)
VLDL: 28.8 mg/dL (ref 0.0–40.0)

## 2014-05-24 LAB — BASIC METABOLIC PANEL
BUN: 19 mg/dL (ref 6–23)
CO2: 33 mEq/L — ABNORMAL HIGH (ref 19–32)
Calcium: 10.1 mg/dL (ref 8.4–10.5)
Chloride: 102 mEq/L (ref 96–112)
Creatinine, Ser: 0.71 mg/dL (ref 0.40–1.20)
GFR: 87.22 mL/min (ref >60.00)
Glucose, Bld: 97 mg/dL (ref 70–99)
Potassium: 4.1 mEq/L (ref 3.5–5.1)
SODIUM: 141 meq/L (ref 135–145)

## 2014-05-24 LAB — HEMOGLOBIN A1C: Hgb A1c MFr Bld: 6 % (ref 4.6–6.5)

## 2014-05-25 ENCOUNTER — Encounter: Payer: Self-pay | Admitting: *Deleted

## 2014-06-01 NOTE — Consult Note (Signed)
Brief Consult Note: Diagnosis: Epistaxis.   Patient was seen by consultant.   Consult note dictated.   Recommend further assessment or treatment.   Discussed with Attending MD.   Comments: 69 y.o. female with history of epistaxis s/p cauterization yesterday with repeat bleeding this a.m.  Attempted packing in ED unsuccessful for controlling bleeding.  Asked to evaluate patient.  Procedure- Control of Epistaxis- partial packing removed from right nares, revealed venous bleeding from inferior septum anteriorly, 5CM merocel wrapped in surgicel placed and bleeding controlled.  Inflated with Afrin.  Impression:  Epistaxis s/p nasal packing  Plan:  1)  Amoxicillin 500 TID X7 days 2)  Diflucan as patient reports frequent yeast infections with abx 3)  Afrin spray TID X 48 hours to right nostril 4)  After 48 hours switch to nasal saline 5)  Avoidance of nose blowing 6)  Follow up on Tuesday for packing removal.  Electronic Signatures: Pascal Lux (MD)  (Signed [phone] 12:17)  Authored: Brief Consult Note   Last Updated: 20-Nov-14 12:17 by Pascal Lux (MD)

## 2014-06-01 NOTE — Consult Note (Signed)
PATIENT NAME:  [name], [name] MR#:  [id number] DATE OF BIRTH:  [birth year]  DATE OF CONSULTATION:  12/29/2012  REFERRING PHYSICIAN:   Conni Slipper.  CONSULTING PHYSICIAN:  Jerene Bears, MD    REASON FOR CONSULTATION:  Epistaxis.  HISTORY OF PRESENT ILLNESS: The patient is a 69 year old female with history of epistaxis, status post cauterization as an outpatient on 12/28/2012. The patient had bleeding again  this morning and called EMS, and was transported to the Emergency Room for evaluation. The patient reports bleeding initially on the right side, and then from the left side later on. She does have a history of resection of a pituitary tumor in the past, as well as resection of a tongue lesion.   PAST MEDICAL HISTORY: Reflux, fibrocystic breast disease, chronic low back pain, insomnia, osteopenia, COPD, hyperlipidemia, depression, hypertension.   PAST SURGICAL HISTORY: Pituitary tumor resection, D and C, resection of tongue lesion, lumpectomy and hip hemiarthroplasty.   REVIEW OF SYSTEMS:  Has positives to nosebleed. There is no respiratory distress. No significant headache. She does also report decreased hearing in the right ear.   SOCIAL HISTORY: The patient lives in [place], [place]. She is married, but separated. She is retired.   FAMILY HISTORY: Noncontributory to easy bleeding.   PHYSICAL EXAMINATION: VITAL SIGNS: Pulse 72, respirations 20, blood pressure 139/88, pulse ox is 99% on room air.   ALLERGIES:  ADHESIVE AND CELEXA.   CURRENT MEDICATIONS: Ultram, Prempro, pantoprazole, multivitamin, metoprolol, Diovan, bupropion, atorvastatin, aspirin, but she has quit for several days secondary to nosebleed, and amlodipine.   PHYSICAL EXAMINATION:  GENERAL: She is a well-nourished, well-developed female in no acute distress. There is active bleeding coming from her right nasal cavity. She has Merocel that is about 2 cm within the nose, but the majority of it is sticking  out soaked with bright red blood. She has a blood clot in her left nasal cavity, as well as a blood clot in the posterior oropharynx.  EARS: EACs are clear. She has right-sided hemotympanum and the left is clear.  NECK: Supple. No lymphadenopathy or thyromegaly.   PROCEDURE: To control epistaxis.   PREPROCEDURE DIAGNOSIS: Epistaxis.   POSTPROCEDURE DIAGNOSIS:  Epistaxis.  DESCRIPTION OF PROCEDURE: Verbal consent was obtained. The patient's nasal packing was removed from the patient's right nasal cavity. This demonstrated brisk venous blood flow from the inferior septum along the inferior aspect of the previous cauterization site, enlarged blood clot. This was removed with a bayonet forceps into suction, and a Merocel 10 cm was cut in half and wrapped with Surgicel and placed in the patient's right nasal cavity and inserted fully past the area of bleeding. The bleeding immediately stopped. The patient was rechecked after 5 minutes with continued control of bleeding, and Afrin was used to inflate the nasal packing.   IMPRESSION:  Epistaxis, status post control with nasal packing.   PLAN: Keep the packing in place for 5 days. I have placed her on amoxicillin for antibiotic coverage, as well as a round of Diflucan, and continue to use Afrin for the next 24 to 48 hours.  I will see her back on Tuesday for packing removal.    ____________________________ Jerene Bears, MD ccv:dmm D: 12/29/2012 12:14:34 ET T: 12/29/2012 12:55:25 ET JOB#: [id number]  cc: Jerene Bears, MD, <Dictator> Jerene Bears MD ELECTRONICALLY SIGNED 01/16/2013 10:30

## 2014-06-03 NOTE — Consult Note (Signed)
Brief Consult Note: Diagnosis: 69 yr old female with pmh of depression,htn for right hip fracture.   Patient was seen by consultant.   Consult note dictated.   Recommend to proceed with surgery or procedure.   Recommend further assessment or treatment.   Orders entered.   Comments: 69 yr old female with h/o htn,anxiety,gerd came in s/p fall with right hip fracture:pt is at low risk for planned surgery,continue  pre op bblocker and diovan for bp control,and IS after aurgery Leucocytoosis:likley stress induced,check cbc am anxiety and depression:may benefit frm psych input after surgery d.  Electronic Signatures: Epifanio Lesches (MD)  (Signed 11-Jan-13 11:39)  Authored: Brief Consult Note   Last Updated: 11-Jan-13 11:39 by Epifanio Lesches (MD)

## 2014-06-03 NOTE — H&P (Signed)
Subjective/Chief Complaint Right hip pain    History of Present Illness 69 year old female fell while arising from the commode, landing on her right hip. She had the sudden onset of severe right hip pain and was unable to stand or bear weight on the right lower extremity. She denied any loss of consciousness. She denied any other injuries.   Past Med/Surgical Hx:  GERD - Esophageal Reflux:   Fibrocystic breast disease:   Chronic low back pain:   Chronic insomnia:   Osteopenia:   COPD:   Hyperlipidemia:   Depression:   Hypertension:   Breast lumpectomy:   Resection of tongue lesion:   Resection of pituitary tumor:   D&C:   ALLERGIES:  Adhesive: Rash  Celexa: Other  HOME MEDICATIONS:  Prempro 0.625 mg-5 mg oral tablet: 1 tab(s) orally once a day , Active  pantoprazole 40 mg oral enteric coated tablet: 1 tab(s) orally 2 times a day , Active  Diovan 160 mg oral tablet: 1 tab(s) orally once a day , Active  metoprolol tartrate 50 mg oral tablet: 0.5 tab(s) orally 2 times a day, Active  amlodipine 5 mg oral tablet: 1 tab(s) orally once a day, Active  atorvastatin 40 mg oral tablet: 1 tab(s) orally once a day, Active  buPROPion 300 mg/24 hours (XL) oral tablet, extended release: 1 tab(s) orally once a day, Active  Aspirin Low Dose 81 mg oral tablet: 1 tab(s) orally once a day, Active  One Tab Daily oral tablet: 1 tab(s) orally once a day as needed., Active  Family and Social History:   Family History Non-Contributory    Social History negative ETOH, negative Illicit drugs, Previous smoker. Married.    Place of Living Home   Review of Systems:   Fever/Chills No    Cough No    Sputum No    Abdominal Pain No    Diarrhea No    Constipation No    Nausea/Vomiting No    SOB/DOE No    Chest Pain No    ROS Causalgia/paresthesias to both feet.   Physical Exam:   GEN WD, WN, NAD    HEENT PERRL, Oropharynx clear    NECK supple  No masses    RESP normal resp  effort  clear BS  no use of accessory muscles    CARD regular rate  no murmur  No LE edema  no JVD  no Rub    ABD denies tenderness  soft  normal BS    GU foley catheter in place    EXTR Right lower extremity is shortened and externally rotated. Pain is elicited with attempts at range of motion of the hip. No gross tenderness to palpation of the knee. No gross knee effusion.    SKIN normal to palpation, No rashes    NEURO follows commands, motor/sensory function intact    PSYCH alert, A+O to time, place, person   Routine Hem:  11-Jan-13 08:37    WBC (CBC) 13.4   RBC (CBC) 4.24   Hemoglobin (CBC) 13.9   Hematocrit (CBC) 41.0   Platelet Count (CBC) 259   MCV 97   MCH 32.8   MCHC 34.0   RDW 12.3  Routine Chem:  11-Jan-13 08:37    Glucose, Serum 134   BUN 16   Creatinine (comp) 0.71   Sodium, Serum 142   Potassium, Serum 3.7   Chloride, Serum 109   CO2, Serum 21   Calcium (Total), Serum 9.4  Anion Gap 12   Osmolality (calc) 286   eGFR (African American) >60   eGFR (Non-African American) >60  Routine Coag:  11-Jan-13 08:37    Prothrombin 13.0   INR 0.9  Routine BB:  11-Jan-13 11:19    Antibody Screen NEGATIVE  Routine UA:  11-Jan-13 11:35    Color (UA) Yellow   Clarity (UA) Clear   Glucose (UA) Negative   Bilirubin (UA) Negative   Ketones (UA) Negative   Specific Gravity (UA) 1.006   Blood (UA) Negative   pH (UA) 7.0   Protein (UA) Negative   Nitrite (UA) Negative   Leukocyte Esterase (UA) Negative   WBC (UA) <1 /HPF   Epithelial Cells (UA) <1 /HPF     Assessment/Admission Diagnosis Displaced right femoral neck fracture    Plan Recommend right hip hemiarthoplasty. Appreciate Medical evaluation.  The risks and benefits of surgical intervention were discussed in detail with the patient. Possible risks include but are not limited to bleeding, nerve injury, blood clots/DVT, pulmonary embolus, infection, dislocation. The patient expressed understanding of  the risks and benefits and agreed with plans for surgery.   The patient states that she took her Beta blocker this AM.  Surgical site signed as per "right site surgery" protocol.   Electronic Signatures: Dereck Leep (MD)  (Signed 11-Jan-13 13:40)  Authored: CHIEF COMPLAINT and HISTORY, PAST MEDICAL/SURGIAL HISTORY, ALLERGIES, HOME MEDICATIONS, FAMILY AND SOCIAL HISTORY, REVIEW OF SYSTEMS, PHYSICAL EXAM, LABS, ASSESSMENT AND PLAN   Last Updated: 11-Jan-13 13:40 by Dereck Leep (MD)

## 2014-06-03 NOTE — Discharge Summary (Signed)
PATIENT NAME:  [name], Picado MR#:  [id number] DATE OF BIRTH:  [birth year]  DATE OF ADMISSION:  02/20/2011 DATE OF DISCHARGE:  02/24/2011  ADMITTING DIAGNOSIS: Displaced right femoral neck fracture.   DISCHARGE DIAGNOSES: 1. Displaced right femoral neck fracture.  2. Hypoxemia with activities.   CONSULTATIONS:  1. [facility] internist.  2. Dr. Ramonita Lab. 3. Dr. Georgie Chard. 4. Hospitalist/[facility].  5. Dr. Raul Del.   HISTORY: Patient is a pleasant 69 year old who on the day prior to admission states that she had gotten up off the commode and was pulling her pants up when she lost her balance and fell. She felt that she just had a bruise. However, she continued to have discomfort and subsequently on 01/11 presented to the Emergency Room complaining of right hip pain. X-rays taken at that time did reveal a fracture of the right hip. She states that she had a sudden onset of severe pain and was unable to stand or bear weight at the time of the fall. She denied any loss of consciousness. Also denied any other injuries. Patient was seen and evaluated in the Emergency Room and then admitted to the hospital. Patient then was seen by the hospitalist for medical clearance, which was given.   Patient did undergo surgery to the right hip on 01/11 on day of admission.   PROCEDURE: Displaced right femoral neck fracture.   ANESTHESIA: General.   IMPLANTS UTILIZED: DePuy size 3 Summit femoral stem cemented, 11 mm centralizer, +0 mm tapered spacer, 48 mm Cathcart hip ball, and a size 3 cement restrictor.   HOSPITAL COURSE: Patient tolerated the procedure very well. She had no complications. She was then taken to PAC-U where she was stabilized then transferred to the orthopedic floor. Patient began receiving anticoagulation therapy of Lovenox 30 mg subcutaneous every 12 hours per anesthesia protocol. She was fitted with TED stockings bilaterally. These were allowed to be removed one hour per eight  hour shift. Patient was also fitted with the AV-I compression foot pumps set at 130 mmHg. Her calves have been nontender, free of any evidence of deep vein thrombosis. Heels were elevated off the bed using rolled towels. Heels have been nontender.   Patient's vital signs have been stable. She has been afebrile. Hemodynamically she was stable. No transfusions were given. Laboratory studies showed that she had potassium of 3.2 on day two following surgery and subsequently this was supplemented with potassium. Follow up potassium was 4.2 upon being discharged. She was noted to have a slight elevation in her sodium at 145 and 146. Otherwise her laboratory studies have been within normal limits.    Physical therapy was initiated on day one for gait training and transfers. Initially was noted to be quite sore and had difficulty but on day three felt much better and was able to ambulate greater than 220 feet. Was able to go up and down four sets of steps. Was independent with bed to chair transfers. However, during the hospital course it was noted when she did get up to a chair and began ambulating her oxygen sats would drop into the 80s. Subsequently, a chest CT was ordered to rule out pulmonary embolus. This was felt to be unremarkable. Patient has been asymptomatic during this time of oxygen saturations. Vital signs have been unchanged. She has had no shortness of breath or any weakness. A pulmonary consult was obtained but patient was doing so much better prior to the visitation that she was discharged to home and  medicine felt that she he worked up outpatient. They felt this was a chronic condition since she was asymptomatic.   Occupational therapy was also initiated on day one for activities of daily living and assistive devices.   Patient's IV, Foley, and catheter were discontinued on day two. Dressing was changed. The wound was free of any drainage or any signs of infection.   DISPOSITION: Patient is  being discharged to home in improved stable condition.   DISCHARGE INSTRUCTIONS:  1. She may weight bear as tolerated. She has gone over the hip precautions.  2. She was instructed in wound care.  3. She will receive home health physical therapy.  4. She is to continue using a walker until cleared by physical therapy to go to a quad cane.  5. She is placed on a regular diet.  6. She is to begin her regular medication she was on prior to admission. She was given a prescription for Lovenox 40 mg subcutaneously daily for 14 days, then discontinue and begin taking one 81 mg enteric-coated aspirin per day. Also a prescription for Ultram was given. She may take 1 to 2 tablets every 4 to 6 hours p.r.n. for pain.  7. She will need to follow up in the office in six weeks for re-evaluation.  8. Staples are to be removed in two weeks followed by the application of benzoin and Steri-Strips.   PAST MEDICAL HISTORY:  1. Gastroesophageal reflux disease.  2. Fibrocystic breast disease. 3. Chronic low back pain. 4. Chronic insomnia. 5. Osteopenia. 6. Chronic obstructive pulmonary disease.  7. Hyperlipidemia.  8. Depression.  9. Hypertension.  ____________________________ Vance Peper, PA jrw:cms D: 02/24/2011 07:02:09 ET T: 02/25/2011 07:29:46 ET JOB#: [id number]  cc: Vance Peper, PA, <Dictator> Geneve Kimpel PA ELECTRONICALLY SIGNED 02/27/2011 16:12

## 2014-06-03 NOTE — Op Note (Signed)
PATIENT NAME:  [name], [name] MR#:  [id number] DATE OF BIRTH:  July 16, 1945  DATE OF PROCEDURE:  02/20/2011  PREOPERATIVE DIAGNOSIS: Displaced right femoral neck fracture.   POSTOPERATIVE DIAGNOSIS: Displaced right femoral neck fracture.   PROCEDURE PERFORMED: Right hip hemiarthroplasty.   SURGEON: Skip Estimable, MD  ASSISTANT: Vance Peper, PA-C (required to maintain retraction throughout the procedure)   ANESTHESIA: General.   ESTIMATED BLOOD LOSS: 650 mL.   FLUIDS REPLACED: 1700 mL of crystalloid.   DRAINS: Two medium Hemovac drains.   IMPLANTS UTILIZED: DePuy size 3 Summit femoral stem (cemented), 11 mm cementralizer, +0 mm tapered spacer, 48 mm Cathcart hip ball, and a size 3 cement restrictor.   INDICATIONS FOR SURGERY: The patient is a 69 year old female who sustained a fall while arising from the commode and landed on her right hip and side. She was unable to stand or bear weight due to right hip pain. She denied any loss of consciousness or any other injury. X-rays obtained in the [facility] emergency room demonstrated a displaced right femoral neck fracture. After a discussion of the risks and benefits of surgical intervention, the patient expressed her understanding of the risks and benefits and agreed with plans for surgical intervention.   PROCEDURE IN DETAIL: The patient was brought into the Operating Room and, after adequate general anesthesia was achieved, the patient was placed in the left lateral decubitus position. An axillary roll was placed and all bony prominences were well padded. The patient's right hip and leg were cleaned and prepped with alcohol and DuraPrep and draped in the usual sterile fashion. A "time out" was performed as per usual protocol. A lateral curvilinear incision was made gently curving towards the posterior superior iliac spine. IT band was incised in line with the skin incision and the fibers of the gluteus maximus were split in line. Short external rotators  were incised and reflected posteriorly as was the piriformis tendon. T-type posterior capsulotomy was performed and large hemarthrosis was evacuated. The femoral head was removed using a corkscrew device and measured using calipers. Diameter of the head measured 48 mm. The acetabulum was inspected and good articular cartilage was noted. It was elected to proceed with hemiarthroplasty. A pilot hole was created and enlarged using a reamer. Serial broaches were inserted up to a size 3 femoral broach. The calcar region was planed and trial reduction was performed with a 48 mm Cathcart hip ball with a +0 tapered spacer. Good equalization of limb lengths was noted and excellent stability appreciated. The femoral head was then dislocated posteriorly. The canal was sized. It was felt that a size 3 cement restrictor was appropriate. This was inserted to the appropriate depth and then removed. The proximal femoral canal was irrigated with copious amounts of normal saline with antibiotic solution using pulsatile lavage and then suctioned dry. The canal was then packed with vaginal packing soaked in dilute Neo-Synephrine. Polymethyl methacrylate cement was prepared in the usual fashion using a vacuum mixer. Vaginal packing was removed and the canal again irrigated and suctioned dry. Cement was introduced in a retrograde fashion and then pressurized. A size 3 Summit femoral component was positioned and impacted in place. Excess cement was removed using freer elevators. After adequate curing of the cement, a 48 mm Cathcart hip ball with a +0 tapered spacer was placed on the cleaned trunnion and impacted into place. The acetabulum was inspected for any bony fragments or debris and then suctioned dry. The hip was reduced and placed through  a range of motion. Good equalization of limb lengths was appreciated and good stability was appreciated.   The wound was irrigated with copious amounts of normal saline with antibiotic solution  using pulsatile lavage and then suctioned dry. Good hemostasis was noted. The posterior capsulotomy was repaired using #5 Ethibond. The piriformis tendon was reapproximated on the undersurface of the gluteus medius tendon using #5 Ethibond. Two medium drains were placed in the wound bed and brought out through a separate stab incision to be attached to a Hemovac reservoir. The IT band was repaired using interrupted sutures of #1 Vicryl. The subcutaneous tissue was approximated in layers using first #0 Vicryl followed by #2-0 Vicryl. Skin was closed with skin staples. A sterile dressing was applied.          The patient tolerated the procedure well. She was transported to the Recovery Room in stable condition.  ____________________________ [name]. [name]., MD jph:slb D: 02/21/2011 00:29:23 ET T: 02/21/2011 10:38:21 ET JOB#: [id number]  cc: [name]. [name]., MD, <Dictator> [name] Sheri Bouche MD ELECTRONICALLY SIGNED 02/21/2011 14:38

## 2014-06-03 NOTE — Consult Note (Signed)
PATIENT NAME:  [name], [name] MR#:  [id number] DATE OF BIRTH:  09-24-1945  DATE OF CONSULTATION:  02/20/2011  ER REFERRING PHYSICIAN:  Francene Castle, MD    CONSULTING PHYSICIAN:  Epifanio Lesches, MD  PRIMARY CARE PHYSICIAN: Fulton Reek, MD    REQUESTING PHYSICIAN: Laurice Record. Holley Bouche., MD   REASON FOR CONSULTATION: Preop clearance for right hip fracture.   HISTORY OF PRESENT ILLNESS: The patient is a 69 year old female patient with history of hypertension, depression and anxiety, came in because she had a fall in the bathroom yesterday, and after that she had suffered pain in the right hip and was noted to have a right rib fracture. I was asked to see her for preop clearance. The patient has right hip pain and just got Dilaudid, says she did not have dizziness, no chest pain, she just tripped and fell in the bathroom. She has history of osteoporosis and is prone for falls.   PAST MEDICAL HISTORY:  1. Hypertension.  2. Hyperlipidemia.  3. Anxiety.   ALLERGIES: Adhesives.  SOCIAL HISTORY: No smoking, no drinking, no drugs.   MEDICATIONS:  1. Amlodipine 5 mg p.o. daily.  2. Aspirin 81 mg p.o. daily.  3. Atorvastatin 40 mg p.o. daily.  4. Bupropion XL 300 mg, 1 tab p.o. daily.  5. Diovan 160 mg p.o. daily.  6. Metoprolol start at 50 mg, takes 1/2 tablet p.o. b.i.d.  7. Pantoprazole 40 mg p.o. b.i.d.  8. Prempro 0.625/5 mg p.o. daily.  9. Boniva once a month.   REVIEW OF SYSTEMS: CONSTITUTIONAL: Feels lots of anxiety. EYES: No blurred vision. ENT: No tinnitus. No ear pain. No epistaxis. No difficulty swallowing. RESPIRATORY: No cough. No wheezing. CARDIOVASCULAR: No chest pain. No palpitations. GASTROINTESTINAL: No nausea, no vomiting. No abdominal pain. GENITOURINARY: No dysuria. ENDOCRINE: No polyuria or nocturia. INTEGUMENTARY: No skin rashes. MUSCULOSKELETAL: Has right hip pain. NEUROLOGICAL: No numbness or weakness. PSYCHIATRIC: The patient has a lot of anxiety and  depression. The patient says that she is not very active at home because of her anxiety and depression, and has osteoporosis, but never had coronary artery disease or stents.    PAST SURGICAL HISTORY:  1. Bilateral tubal ligation.  2. History of biopsy of the tongue.  3. History of pituitary adenoma removal three years ago.  4. History of two dilatation and curettages.   PHYSICAL EXAMINATION:  VITAL SIGNS: Temperature 97.8, pulse 70, respirations 16, blood pressure 150/85.   GENERAL: This is a 69 year old female patient seen in the ER, very anxious.    HEENT: Head atraumatic, normocephalic. Pupils are equally reacting to light. Extraocular movements are intact.   ENT: No tympanic membrane congestion. No turbinate hypertrophy. No oropharyngeal erythema.   NECK: Normal range of motion. No JVD. No carotid bruit.   CARDIOVASCULAR: S1 and S2 regular. No murmurs.   LUNGS: Bilaterally clear to auscultation. No wheeze. No rales.   ABDOMEN: Soft, nontender, nondistended. Bowel sounds present.   EXTREMITIES: No extremity edema. No cyanosis. No clubbing. She has right hip pain and the right hip is slightly swollen in the right leg.  NEUROLOGICAL: Alert, awake, and oriented. No focal neurological deficits.   PSYCHIATRIC: Anxious and cooperative.   LABORATORY, DIAGNOSTIC AND RADIOLOGICAL DATA:  Right hip x-ray showed femoral neck fracture of the right hip.  X-ray of the chest is clear.  Pelvis x-ray shows a right femoral neck fracture.  WBC 13.4, hemoglobin 13.1, hematocrit 41, platelets 259.  Electrolytes: Sodium 142, potassium 3.7, chloride 109,  bicarbonate 21, BUN 16, creatinine 0.71, glucose 134.  INR is 0.9.  EKG shows sinus rhythm at 72 beats per minute. No ST-T changes.   ASSESSMENT/PLAN/RECOMMENDATIONS: The patient is a 69 year old female with hypertension, anxiety, status post fall with right hip fracture. The patient is at low risk for planned surgery as she does not have any  coronary artery disease. Recommend incentive spirometry and preop beta blockers. She is already on metoprolol, and continue the metoprolol 25 mg p.o. b.i.d., and also statin-lovastatin 40 mg daily, and deep vein thrombosis prophylaxis as well.  Severe depression: She does have history of depression and anxiety and takes bupropion XL. She may benefit from psychiatric evaluation.   Thank you for asking [place] to admit the patient.   TIME SPENT: 55 minutes.  ____________________________ Epifanio Lesches, MD sk:cbb D: 02/20/2011 11:34:20 ET T: 02/20/2011 14:32:51 ET JOB#: [id number]  cc: Epifanio Lesches, MD, <Dictator> Leonie Douglas. Doy Hutching, MD Epifanio Lesches MD ELECTRONICALLY SIGNED 02/22/2011 16:28

## 2014-06-18 ENCOUNTER — Ambulatory Visit (INDEPENDENT_AMBULATORY_CARE_PROVIDER_SITE_OTHER): Payer: Medicare Other

## 2014-06-18 VITALS — BP 128/72 | HR 67 | Temp 98.3°F | Resp 14 | Ht 66.0 in | Wt 171.5 lb

## 2014-06-18 DIAGNOSIS — Z780 Asymptomatic menopausal state: Secondary | ICD-10-CM

## 2014-06-18 DIAGNOSIS — Z0000 Encounter for general adult medical examination without abnormal findings: Secondary | ICD-10-CM

## 2014-06-18 NOTE — Patient Instructions (Addendum)
[name] , Thank you for taking time to come for your Medicare Wellness Visit. I appreciate your ongoing commitment to your health goals. Please review the following plan we discussed and let me know if I can assist you in the future. Please schedule a follow up appointment to address left foot pain.  These are the goals we discussed: Goals    . Prevent Falls       This is a list of the screening recommended for you and due dates:  Health Maintenance  Topic Date Due  . Tetanus Vaccine  03/20/1966  . DEXA scan (bone density measurement)  03/20/2012  . Shingles Vaccine  03/17/2015*  . Flu Shot  09/10/2014  . Pneumonia vaccines (2 of 2 - PPSV23) 05/17/2015  . Mammogram  09/19/2015  . Colon Cancer Screening  09/16/2023  *Topic was postponed. The date shown is not the original due date.    Bone Densitometry Bone densitometry is a special X-ray that measures your bone density and can be used to help predict your risk of bone fractures. This test is used to determine bone mineral content and density to diagnose osteoporosis. Osteoporosis is the loss of bone that may cause the bone to become weak. Osteoporosis commonly occurs in women entering menopause. However, it may be found in men and in people with other diseases. PREPARATION FOR TEST No preparation necessary. WHO SHOULD BE TESTED?  All women older than 81.  Postmenopausal women (50 to 18) with risk factors for osteoporosis.  People with a previous fracture caused by normal activities.  People with a small body frame (less than 127 poundsor a body mass index [BMI] of less than 21).  People who have a parent with a hip fracture or history of osteoporosis.  People who smoke.  People who have rheumatoid arthritis.  Anyone who engages in excessive alcohol use (more than 3 drinks most days).  Women who experience early menopause. WHEN SHOULD YOU BE RETESTED? Current guidelines suggest that you should wait at least 2 years  before doing a bone density test again if your first test was normal.Recent studies indicated that women with normal bone density may be able to wait a few years before needing to repeat a bone density test. You should discuss this with your caregiver.  NORMAL FINDINGS   Normal: less than standard deviation below normal (greater than -1).  Osteopenia: 1 to 2.5 standard deviations below normal (-1 to -2.5).  Osteoporosis: greater than 2.5 standard deviations below normal (less than -2.5). Test results are reported as a "T score" and a "Z score."The T score is a number that compares your bone density with the bone density of healthy, young women.The Z score is a number that compares your bone density with the scores of women who are the same age, gender, and race.  Ranges for normal findings may vary among different laboratories and hospitals. You should always check with your doctor after having lab work or other tests done to discuss the meaning of your test results and whether your values are considered within normal limits. MEANING OF TEST  Your caregiver will go over the test results with you and discuss the importance and meaning of your results, as well as treatment options and the need for additional tests if necessary. OBTAINING THE TEST RESULTS It is your responsibility to obtain your test results. Ask the lab or department performing the test when and how you will get your results. Document Released: 02/18/2004 Document Revised:  04/20/2011 Document Reviewed: 03/12/2010 Exit[employer] Patient Information 2015 [place], [place]. This information is not intended to replace advice given to you by your health care provider. Make sure you discuss any questions you have with your health care provider.

## 2014-06-18 NOTE — Progress Notes (Signed)
Subjective:   [name] is a 69 y.o. female who presents for Medicare Annual (Subsequent) preventive examination.  Review of Systems:   Cardiac Risk Factors include: advanced age (>61men, >54 women);hypertension     Objective:     Vitals: BP 128/72 mmHg  Pulse 67  Temp(Src) 98.3 F (36.8 C) (Oral)  Resp 14  Ht 5\' 6"  (1.676 m)  Wt 171 lb 8 oz (77.792 kg)  BMI 27.69 kg/m2  SpO2 95%  Tobacco History  Smoking status  . Former Smoker  . Quit date: 02/10/2008  Smokeless tobacco  . Never Used     Counseling given: Not Answered   Past Medical History  Diagnosis Date  . Hypertension   . Anxiety   . Depression   . GERD (gastroesophageal reflux disease)   . Hypercholesterolemia   . Chronic insomnia   . COPD (chronic obstructive pulmonary disease)     documented in outside records  . Arthritis   . Heart murmur   . Osteoporosis   . Complication of anesthesia     "AFFECTS MY MEMORY"  . Shortness of breath     with exertion  . Tingling     both hands  . History of stomach ulcers   . Hyperparathyroidism, primary   . Burning feet syndrome   . Dysrhythmia     PALPITATIONS    Past Surgical History  Procedure Laterality Date  . Dilation and curettage of uterus      X2   . Pituitary surgery      FOR TUMOR 3 YRS   . Joint replacement      RT HIP  2013  (PARTIAL)   . Breast surgery      RT BREAST CYST REMOVED 15 YRS AGO   . Anterior cervical decomp/discectomy fusion  10/28/2011    Procedure: ANTERIOR CERVICAL DECOMPRESSION/DISCECTOMY FUSION 2 LEVELS;  Surgeon: Elaina Hoops, MD;  Location: [facility];  Service: Neurosurgery;  Laterality: N/A;  . Parathyroidectomy Left 08/25/2012    Procedure: PARATHYROIDECTOMY;  Surgeon: Earnstine Regal, MD;  Location: [facility];  Service: General;  Laterality: Left;   Family History  Problem Relation Age of Onset  . Glaucoma Mother   . Hypertension Mother   . Thyroid disease Mother   . Stroke Father   . Hypertension  Father    History  Sexual Activity  . Sexual Activity: Not on file    Outpatient Encounter Prescriptions as of 06/18/2014  Medication Sig  . amLODipine (NORVASC) 5 MG tablet Take 1 tablet (5 mg total) by mouth daily.  Marland Kitchen aspirin EC 81 MG tablet Take 81 mg by mouth daily.  Marland Kitchen atorvastatin (LIPITOR) 40 MG tablet Take 1 tablet (40 mg total) by mouth once.  . Biotin 10 MG CAPS Take by mouth.  . chlorthalidone (HYGROTON) 25 MG tablet TAKE 1 TABLET (25 MG TOTAL) BY MOUTH DAILY.  . cholecalciferol (VITAMIN D) 1000 UNITS tablet Take 2,000 Units by mouth daily.  Marland Kitchen docusate sodium (COLACE) 100 MG capsule Take 100 mg by mouth 3 (three) times daily.   Marland Kitchen gabapentin (NEURONTIN) 100 MG capsule TAKE 1-2 CAPSULES AT BEDTIME AS NEEDED. PLEASE STOP TRAMADOL (Patient taking differently: 300 mg. TAKE 3 CAPSULES AT BEDTIME AS NEEDED. PLEASE STOP TRAMADOL)  . IRON PO Take 45 mg by mouth daily.   . magnesium oxide (MAG-OX) 400 (241.3 MG) MG tablet TAKE 1 TABLET (400 MG TOTAL) BY MOUTH DAILY. PROD NOT COVERED  . meloxicam (MOBIC)  15 MG tablet TAKE 1 TABLET (15 MG TOTAL) BY MOUTH DAILY. (Patient taking differently: TAKE 1/2 TABLET (7.5MG  TOTAL) BY MOUTH DAILY.)  . Omega-3 Fatty Acids (OMEGA 3 PO) Take by mouth.  . pantoprazole (PROTONIX) 40 MG tablet Take 1 tablet (40 mg total) by mouth daily.  . potassium chloride (KLOR-CON M10) 10 MEQ tablet Take 1 tablet (10 mEq total) by mouth daily.  . valsartan (DIOVAN) 320 MG tablet TAKE 1 TABLET (320 MG TOTAL) BY MOUTH DAILY.   No facility-administered encounter medications on file as of 06/18/2014.    Activities of Daily Living In your present state of health, do you have any difficulty performing the following activities: 06/18/2014  Hearing? Y  Vision? Y  Difficulty concentrating or making decisions? N  Walking or climbing stairs? Y  Dressing or bathing? N  Doing errands, shopping? N  Preparing Food and eating ? N  Using the Toilet? N  In the past six months, have you  accidently leaked urine? N  Do you have problems with loss of bowel control? N  Managing your Medications? N  Managing your Finances? N  Housekeeping or managing your Housekeeping? N    Patient Care Team: Einar Pheasant, MD as PCP - General (Internal Medicine) Dereck Leep, MD (Orthopedic Surgery)    Assessment:     Exercise Activities and Dietary recommendations Current Exercise Habits:: Home exercise routine, Type of exercise: walking (walks dog 4-5 times a day for about 20 min), Time (Minutes): 30, Frequency (Times/Week): 4, Weekly Exercise (Minutes/Week): 120, Intensity: Mild  Goals    . Prevent Falls      Fall Risk Fall Risk  06/18/2014 05/17/2014 12/20/2012  Falls in the past year? No Yes No  Number falls in past yr: - 2 or more -  Injury with Fall? - No -  Risk for fall due to : - Impaired balance/gait -   Depression Screen PHQ 2/9 Scores 06/18/2014 05/17/2014 12/20/2012  PHQ - 2 Score 2 0 4  PHQ- 9 Score 8 - 15     Cognitive Testing MMSE - Mini Mental State Exam 06/18/2014  Orientation to time 5  Orientation to Place 5  Registration 3  Attention/ Calculation 5  Recall 3  Language- name 2 objects 2  Language- repeat 1  Language- follow 3 step command 3  Language- read & follow direction 1  Write a sentence 1  Copy design 1  Total score 30    Immunization History  Administered Date(s) Administered  . Influenza Split 02/12/2012  . Influenza,inj,Quad PF,36+ Mos 10/24/2012, 11/28/2013  . Pneumococcal Conjugate-13 05/17/2014   Screening Tests Health Maintenance  Topic Date Due  . TETANUS/TDAP  03/20/1966  . DEXA SCAN  03/20/2012  . ZOSTAVAX  03/17/2015 (Originally 03/21/2007)  . INFLUENZA VACCINE  09/10/2014  . PNA vac Low Risk Adult (2 of 2 - PPSV23) 05/17/2015  . MAMMOGRAM  09/19/2015  . COLONOSCOPY  09/16/2023      Plan:    During the course of the visit the patient was educated and counseled about the following appropriate screening and preventive  services:   Vaccines to include Pneumoccal, Influenza, Hepatitis B, Td, Zostavax, HCV  Electrocardiogram  Cardiovascular Disease  Colorectal cancer screening  Bone density screening  Diabetes screening  Glaucoma screening  Mammography/PAP  Nutrition counseling   Patient Instructions (the written plan) was given to the patient.   Geni Bers, LPN  03/20/7679

## 2014-06-19 ENCOUNTER — Ambulatory Visit (INDEPENDENT_AMBULATORY_CARE_PROVIDER_SITE_OTHER): Payer: Medicare Other | Admitting: Nurse Practitioner

## 2014-06-19 ENCOUNTER — Encounter: Payer: Self-pay | Admitting: Nurse Practitioner

## 2014-06-19 VITALS — BP 126/82 | HR 72 | Temp 97.6°F | Resp 14 | Ht 66.0 in | Wt 171.8 lb

## 2014-06-19 DIAGNOSIS — G629 Polyneuropathy, unspecified: Secondary | ICD-10-CM | POA: Diagnosis not present

## 2014-06-19 MED ORDER — MELOXICAM 15 MG PO TABS: ORAL_TABLET | ORAL | Status: DC

## 2014-06-19 NOTE — Progress Notes (Signed)
Pre visit review using our clinic review tool, if applicable. No additional management support is needed unless otherwise documented below in the visit note. 

## 2014-06-19 NOTE — Progress Notes (Signed)
   Subjective:    Patient ID: [name], female    DOB: 04-17-1945, 69 y.o.   MRN: [id number]  HPI  [name] is a 69 yo female with a CC of left foot/heel pain x 1 week.   1) Knee surgery right- on June 8th  Dr. Jacqualyn Posey 2/16 saw him for podiatry and was supposed to follow up in 3 months (this month).  Left heel hurts, pt reports it is better this morning after taking a Meloxicam last night.   Review of Systems  Constitutional: Negative for fever, chills, diaphoresis and fatigue.  Respiratory: Negative for chest tightness, shortness of breath and wheezing.   Cardiovascular: Negative for chest pain, palpitations and leg swelling.  Gastrointestinal: Negative for nausea, vomiting and diarrhea.  Musculoskeletal: Positive for arthralgias.       Left heel pain  Skin: Negative for rash.  Neurological: Negative for dizziness, weakness, numbness and headaches.      Objective:   Physical Exam  Constitutional: She is oriented to person, place, and time. She appears well-developed and well-nourished. No distress.  BP 126/82 mmHg  Pulse 72  Temp(Src) 97.6 F (36.4 C) (Oral)  Resp 14  Ht 5\' 6"  (1.676 m)  Wt 171 lb 12.8 oz (77.928 kg)  BMI 27.74 kg/m2  SpO2 97%   HENT:  Head: Normocephalic and atraumatic.  Right Ear: External ear normal.  Left Ear: External ear normal.  Eyes: Right eye exhibits no discharge. Left eye exhibits no discharge. No scleral icterus.  Cardiovascular: Normal rate, regular rhythm and normal heart sounds.  Exam reveals no gallop and no friction rub.   No murmur heard. Pulmonary/Chest: Effort normal and breath sounds normal. No respiratory distress. She has no wheezes. She has no rales. She exhibits no tenderness.  Musculoskeletal: She exhibits tenderness. She exhibits no edema.  Tender to palpation left heel, no obvious deformity or color change to skin  Neurological: She is alert and oriented to person, place, and time. No cranial nerve deficit. She  exhibits normal muscle tone. Coordination normal.  Skin: Skin is warm and dry. No rash noted. She is not diaphoretic.  Psychiatric: She has a normal mood and affect. Her behavior is normal. Judgment and thought content normal.      Assessment & Plan:

## 2014-06-19 NOTE — Patient Instructions (Addendum)
Do not take the Meloxicam for more than 2 weeks at a time   Heel Spur A heel spur is a hook of bone that can form on the calcaneus (the heel bone and the largest bone of the foot). Heel spurs are often associated with plantar fasciitis and usually come in people who have had the problem for an extended period of time. The cause of the relationship is unknown. The pain associated with them is thought to be caused by an inflammation (soreness and redness) of the plantar fascia rather than the spur itself. The plantar fascia is a thick fibrous like tissue that runs from the calcaneus (heel bone) to the ball of the foot. This strong, tight tissue helps maintain the arch of your foot. It helps distribute the weight across your foot as you walk or run. Stresses placed on the plantar fascia can be tremendous. When it is inflamed normal activities become painful. Pain is worse in the morning after sleeping. After sleeping the plantar fascia is tight. The first movements stretch the fascia and this causes pain. As the tendon loosens, the pain usually gets better. It often returns with too much standing or walking.  About 70% of patients with plantar fasciitis have a heel spur. About half of people without foot pain also have heel spurs. DIAGNOSIS  The diagnosis of a heel spur is made by X-ray. The X-ray shows a hook of bone protruding from the bottom of the calcaneus at the point where the plantar fascia is attached to the heel bone.  TREATMENT  It is necessary to find out what is causing the stretching of the plantar fascia. If the cause is over-pronation (flat feet), orthotics and proper foot ware may help.  Stretching exercises, losing weight, wearing shoes that have a cushioned heel that absorbs shock, and elevating the heel with the use of a heel cradle, heel cup, or orthotics may all help. Heel cradles and heel cups provide extra comfort and cushion to the heel, and reduce the amount of shock to the sore  area. AVOIDING THE PAIN OF PLANTAR FASCIITIS AND HEEL SPURS  Consult a sports medicine professional before beginning a new exercise program.  Walking programs offer a good workout. There is a lower chance of overuse injuries common to the runners. There is less impact and less jarring of the joints.  Begin all new exercise programs slowly. If problems or pains develop, decrease the amount of time or distance until you are at a comfortable level.  Wear good shoes and replace them regularly.  Stretch your foot and the heel cords at the back of the ankle (Achilles tendons) both before and after exercise.  Run or exercise on even surfaces that are not hard. For example, asphalt is better than pavement.  Do not run barefoot on hard surfaces.  If using a treadmill, vary the incline.  Do not continue to workout if you have foot or joint problems. Seek professional help if they do not improve. HOME CARE INSTRUCTIONS   Avoid activities that cause you pain until you recover.  Use ice or cold packs to the problem or painful areas after working out.  Only take over-the-counter or prescription medicines for pain, discomfort, or fever as directed by your caregiver.  Soft shoe inserts or athletic shoes with air or gel sole cushions may be helpful.  If problems continue or become more severe, consult a sports medicine caregiver. Cortisone is a potent anti-inflammatory medication that may be injected into  the painful area. You can discuss this treatment with your caregiver. MAKE SURE YOU:   Understand these instructions.  Will watch your condition.  Will get help right away if you are not doing well or get worse. Document Released: 03/04/2005 Document Revised: 04/20/2011 Document Reviewed: 03/29/2013 [facility] Patient Information 2015 [place], [place]. This information is not intended to replace advice given to you by your health care provider. Make sure you discuss any questions you have with  your health care provider.

## 2014-06-24 NOTE — Assessment & Plan Note (Signed)
Neuropathy is doing well with Gabapentin. Heel pain could be from neuropathy or shift in gait for knee pain. Will try Meloxicam 15 mg once every other day. Continue protonix and stop medicine if stomach upset. Stop after 2 weeks.

## 2014-07-04 DIAGNOSIS — Z01810 Encounter for preprocedural cardiovascular examination: Secondary | ICD-10-CM | POA: Diagnosis present

## 2014-07-04 DIAGNOSIS — R011 Cardiac murmur, unspecified: Secondary | ICD-10-CM | POA: Diagnosis not present

## 2014-07-04 DIAGNOSIS — K219 Gastro-esophageal reflux disease without esophagitis: Secondary | ICD-10-CM | POA: Diagnosis not present

## 2014-07-04 DIAGNOSIS — M545 Low back pain: Secondary | ICD-10-CM | POA: Diagnosis not present

## 2014-07-04 DIAGNOSIS — J449 Chronic obstructive pulmonary disease, unspecified: Secondary | ICD-10-CM | POA: Diagnosis not present

## 2014-07-04 DIAGNOSIS — M179 Osteoarthritis of knee, unspecified: Secondary | ICD-10-CM | POA: Diagnosis not present

## 2014-07-04 DIAGNOSIS — Z01812 Encounter for preprocedural laboratory examination: Secondary | ICD-10-CM | POA: Diagnosis present

## 2014-07-04 DIAGNOSIS — Z79899 Other long term (current) drug therapy: Secondary | ICD-10-CM | POA: Diagnosis not present

## 2014-07-04 DIAGNOSIS — R002 Palpitations: Secondary | ICD-10-CM | POA: Diagnosis not present

## 2014-07-04 DIAGNOSIS — E785 Hyperlipidemia, unspecified: Secondary | ICD-10-CM | POA: Diagnosis not present

## 2014-07-04 DIAGNOSIS — Z7982 Long term (current) use of aspirin: Secondary | ICD-10-CM | POA: Diagnosis not present

## 2014-07-04 LAB — URINALYSIS COMPLETE WITH MICROSCOPIC (ARMC ONLY)
Bacteria, UA: NONE SEEN
Bilirubin Urine: NEGATIVE
Glucose, UA: NEGATIVE mg/dL
Hgb urine dipstick: NEGATIVE
Ketones, ur: NEGATIVE mg/dL
NITRITE: NEGATIVE
PH: 5 (ref 5.0–8.0)
Protein, ur: NEGATIVE mg/dL
Specific Gravity, Urine: 1.019 (ref 1.005–1.030)

## 2014-07-04 LAB — SURGICAL PCR SCREEN
MRSA, PCR: NEGATIVE
Staphylococcus aureus: POSITIVE — AB

## 2014-07-04 NOTE — Patient Instructions (Signed)
  Your procedure is scheduled on: 07/18/2014 Wednesday Report to Day Surgery. [facility] Entrance To find out your arrival time please call [phone] between 1PM - 3PM on 07/17/2014 Tuesday.  Remember: Instructions that are not followed completely may result in serious medical risk, up to and including death, or upon the discretion of your surgeon and anesthesiologist your surgery may need to be rescheduled.    __x__ 1. Do not eat food or drink liquids after midnight. No gum chewing or hard candies.     _x__ 2. No Alcohol for 24 hours before or after surgery.   ____ 3. Bring all medications with you on the day of surgery if instructed.    __x__ 4. Notify your doctor if there is any change in your medical condition     (cold, fever, infections).     Do not wear jewelry, make-up, hairpins, clips or nail polish.  Do not wear lotions, powders, or perfumes.   Do not shave 48 hours prior to surgery. Men may shave face and neck.  Do not bring valuables to the hospital.    [facility] is not responsible for any belongings or valuables.               Contacts, dentures or bridgework may not be worn into surgery.  Leave your suitcase in the car. After surgery it may be brought to your room.  For patients admitted to the hospital, discharge time is determined by your                treatment team.   Patients discharged the day of surgery will not be allowed to drive home.   Please read over the following fact sheets that you were given:   MRSA Information and Surgical Site Infection Prevention   __x__ Take these medicines the morning of surgery with A SIP OF WATER:    1. pantoprazole  2.   3.   4.  5.  6.  ____ Fleet Enema (as directed)   __x__ Use CHG Soap as directed  ____ Use inhalers on the day of surgery  ____ Stop metformin 2 days prior to surgery    ____ Take 1/2 of usual insulin dose the night before surgery and none on the morning of surgery.   __x__ Stop  Coumadin/Plavix/aspirin on today  __x__ Stop Anti-inflammatories on today   _x___ Stop supplements until after surgery.    ____ Bring C-Pap to the hospital.

## 2014-07-05 ENCOUNTER — Encounter
Admission: RE | Admit: 2014-07-05 | Discharge: 2014-07-05 | Disposition: A | Discharge status: Home / Self Care | Payer: Medicare Other | Source: Ambulatory Visit | Attending: Orthopedic Surgery | Admitting: Orthopedic Surgery

## 2014-07-05 DIAGNOSIS — Z01810 Encounter for preprocedural cardiovascular examination: Secondary | ICD-10-CM | POA: Diagnosis not present

## 2014-07-05 DIAGNOSIS — J449 Chronic obstructive pulmonary disease, unspecified: Secondary | ICD-10-CM | POA: Insufficient documentation

## 2014-07-05 DIAGNOSIS — R002 Palpitations: Secondary | ICD-10-CM | POA: Insufficient documentation

## 2014-07-05 DIAGNOSIS — Z7982 Long term (current) use of aspirin: Secondary | ICD-10-CM | POA: Insufficient documentation

## 2014-07-05 DIAGNOSIS — Z79899 Other long term (current) drug therapy: Secondary | ICD-10-CM | POA: Insufficient documentation

## 2014-07-05 DIAGNOSIS — K219 Gastro-esophageal reflux disease without esophagitis: Secondary | ICD-10-CM | POA: Insufficient documentation

## 2014-07-05 DIAGNOSIS — M179 Osteoarthritis of knee, unspecified: Secondary | ICD-10-CM | POA: Insufficient documentation

## 2014-07-05 DIAGNOSIS — R011 Cardiac murmur, unspecified: Secondary | ICD-10-CM | POA: Insufficient documentation

## 2014-07-05 DIAGNOSIS — M545 Low back pain: Secondary | ICD-10-CM | POA: Insufficient documentation

## 2014-07-05 DIAGNOSIS — Z01812 Encounter for preprocedural laboratory examination: Secondary | ICD-10-CM | POA: Insufficient documentation

## 2014-07-05 DIAGNOSIS — E785 Hyperlipidemia, unspecified: Secondary | ICD-10-CM | POA: Insufficient documentation

## 2014-07-05 LAB — BASIC METABOLIC PANEL
Anion gap: 11 (ref 5–15)
BUN: 23 mg/dL — AB (ref 6–20)
CO2: 28 mmol/L (ref 22–32)
Calcium: 9.6 mg/dL (ref 8.9–10.3)
Chloride: 103 mmol/L (ref 101–111)
Creatinine, Ser: 0.69 mg/dL (ref 0.44–1.00)
GFR calc Af Amer: >60 mL/min (ref >60)
Glucose, Bld: 111 mg/dL — ABNORMAL HIGH (ref 65–99)
POTASSIUM: 3.6 mmol/L (ref 3.5–5.1)
Sodium: 142 mmol/L (ref 135–145)

## 2014-07-05 LAB — APTT: APTT: 30 s (ref 24–36)

## 2014-07-05 LAB — TYPE AND SCREEN
ABO/RH(D): O POS
Antibody Screen: NEGATIVE

## 2014-07-05 LAB — CBC
HCT: 40.7 % (ref 35.0–47.0)
Hemoglobin: 13.5 g/dL (ref 12.0–16.0)
MCH: 32.1 pg (ref 26.0–34.0)
MCHC: 33.1 g/dL (ref 32.0–36.0)
MCV: 96.9 fL (ref 80.0–100.0)
PLATELETS: 265 10*3/uL (ref 150–440)
RBC: 4.2 MIL/uL (ref 3.80–5.20)
RDW: 13.1 % (ref 11.5–14.5)
WBC: 6.7 10*3/uL (ref 3.6–11.0)

## 2014-07-05 LAB — PROTIME-INR
INR: 0.91
Prothrombin Time: 12.5 seconds (ref 11.4–15.0)

## 2014-07-05 LAB — ABO/RH: ABO/RH(D): O POS

## 2014-07-05 LAB — SEDIMENTATION RATE: Sed Rate: 34 mm/hr — ABNORMAL HIGH (ref 0–30)

## 2014-07-06 LAB — URINE CULTURE

## 2014-07-06 NOTE — OR Nursing (Signed)
Positive MRSA FAXED TO SURGEON

## 2014-07-06 NOTE — Pre-Procedure Instructions (Signed)
Received a call from Dr. Clydell Hakim office nurse Margaretha Sheffield regarding + Staph Aureus nasal swab culture, Margaretha Sheffield will call in the mupirocin ointment to pt's pharmacy and pt will be instructed to stop by PAT next week to pick up the SAGE wipes for 5 nights prior to surgery.

## 2014-07-12 ENCOUNTER — Encounter (INDEPENDENT_AMBULATORY_CARE_PROVIDER_SITE_OTHER): Payer: Self-pay

## 2014-07-12 ENCOUNTER — Encounter: Payer: Self-pay | Admitting: Internal Medicine

## 2014-07-12 ENCOUNTER — Ambulatory Visit (INDEPENDENT_AMBULATORY_CARE_PROVIDER_SITE_OTHER): Payer: Medicare Other | Admitting: Internal Medicine

## 2014-07-12 VITALS — BP 110/66 | HR 62 | Temp 97.4°F | Ht 66.0 in | Wt 169.0 lb

## 2014-07-12 DIAGNOSIS — F329 Major depressive disorder, single episode, unspecified: Secondary | ICD-10-CM

## 2014-07-12 DIAGNOSIS — I10 Essential (primary) hypertension: Secondary | ICD-10-CM | POA: Diagnosis not present

## 2014-07-12 DIAGNOSIS — D649 Anemia, unspecified: Secondary | ICD-10-CM

## 2014-07-12 DIAGNOSIS — Z01818 Encounter for other preprocedural examination: Secondary | ICD-10-CM

## 2014-07-12 DIAGNOSIS — Z860100 Personal history of colon polyps, unspecified: Secondary | ICD-10-CM

## 2014-07-12 DIAGNOSIS — E210 Primary hyperparathyroidism: Secondary | ICD-10-CM | POA: Diagnosis not present

## 2014-07-12 DIAGNOSIS — K2970 Gastritis, unspecified, without bleeding: Secondary | ICD-10-CM

## 2014-07-12 DIAGNOSIS — F32A Depression, unspecified: Secondary | ICD-10-CM

## 2014-07-12 DIAGNOSIS — M25561 Pain in right knee: Secondary | ICD-10-CM

## 2014-07-12 NOTE — OR Nursing (Signed)
Late Entry: [name] came in on Tuesday 5/31 to get her SAGE Cloths. She was given 5 packages and verbal and written instructions for when and how to use.

## 2014-07-12 NOTE — Progress Notes (Signed)
Patient ID: [name], female   DOB: 10/15/1945, 69 y.o.   MRN: [id number]   Subjective:    Patient ID: [name], female    DOB: 10/27/1945, 69 y.o.   MRN: [id number]  HPI  Patient here for a pre op evaluation.  Is planning to have TKR next week.  Seeing Dr Hooten.  She denies any sob or chest pain.  Just had pre op evaluation at the hospital.  Had EKG and labs.   Scheduled for surgery next week.  Breathing stable.  No cough or congestion.  No diarrhea.  Does describe what sounds like a vaginal or bladder prolapse.  Just noticed.  No pain or problems.  No urinary or vaginal symptoms.     Past Medical History  Diagnosis Date  . Hypertension   . Anxiety   . Depression   . GERD (gastroesophageal reflux disease)   . Hypercholesterolemia   . Chronic insomnia   . COPD (chronic obstructive pulmonary disease)     documented in outside records  . Arthritis   . Heart murmur   . Osteoporosis   . Complication of anesthesia     "AFFECTS MY MEMORY"  . Shortness of breath     with exertion  . Tingling     both hands  . History of stomach ulcers   . Hyperparathyroidism, primary   . Burning feet syndrome   . Dysrhythmia     PALPITATIONS     Current Outpatient Prescriptions on File Prior to Visit  Medication Sig Dispense Refill  . acetaminophen (TYLENOL) 500 MG tablet Take 1,000 mg by mouth every 6 (six) hours as needed.    . amLODipine (NORVASC) 5 MG tablet Take 1 tablet (5 mg total) by mouth daily. 90 tablet 1  . aspirin EC 81 MG tablet Take 81 mg by mouth daily.    . atorvastatin (LIPITOR) 40 MG tablet Take 1 tablet (40 mg total) by mouth once. 30 tablet 5  . Biotin 5000 MCG CAPS Take 1 capsule by mouth daily.    . chlorthalidone (HYGROTON) 25 MG tablet TAKE 1 TABLET (25 MG TOTAL) BY MOUTH DAILY. 90 tablet 1  . cholecalciferol (VITAMIN D) 1000 UNITS tablet Take 2,000 Units by mouth daily.    . docusate sodium (COLACE) 100 MG capsule Take 100 mg by mouth 3 (three) times  daily as needed.     . gabapentin (NEURONTIN) 300 MG capsule 600 mg at bedtime.   3  . IRON PO Take 65 mg by mouth daily.     . magnesium oxide (MAG-OX) 400 (241.3 MG) MG tablet TAKE 1 TABLET (400 MG TOTAL) BY MOUTH DAILY. PROD NOT COVERED 30 tablet 5  . meloxicam (MOBIC) 15 MG tablet TAKE 1 TABLET (15 MG TOTAL) BY MOUTH EVERY OTHER DAY. 30 tablet 0  . Omega-3 Fatty Acids (OMEGA 3 PO) Take by mouth.    . pantoprazole (PROTONIX) 40 MG tablet Take 1 tablet (40 mg total) by mouth daily. 90 tablet 1  . potassium chloride (KLOR-CON M10) 10 MEQ tablet Take 1 tablet (10 mEq total) by mouth daily. 90 tablet 1  . valsartan (DIOVAN) 320 MG tablet TAKE 1 TABLET (320 MG TOTAL) BY MOUTH DAILY. 90 tablet 1  . [DISCONTINUED] potassium chloride (K-DUR) 10 MEQ tablet Take 1 tablet (10 mEq total) by mouth daily. 30 tablet 5   No current facility-administered medications on file prior to visit.    Review of Systems  Constitutional: Negative for appetite   change and unexpected weight change.  HENT: Negative for congestion and sinus pressure.   Respiratory: Negative for cough, chest tightness and shortness of breath.   Cardiovascular: Negative for chest pain, palpitations and leg swelling.  Gastrointestinal: Negative for nausea, vomiting, abdominal pain and diarrhea.  Genitourinary: Negative for dysuria and difficulty urinating.       Bladder prolapse.    Skin: Negative for color change and rash.  Neurological: Negative for dizziness, light-headedness and headaches.  Psychiatric/Behavioral: Negative for dysphoric mood and agitation.       Objective:    Physical Exam  Constitutional: She appears well-developed and well-nourished. No distress.  HENT:  Nose: Nose normal.  Mouth/Throat: Oropharynx is clear and moist.  Neck: Neck supple. No thyromegaly present.  Cardiovascular: Normal rate and regular rhythm.   Pulmonary/Chest: Breath sounds normal. No respiratory distress. She has no wheezes.  Abdominal:  Soft. Bowel sounds are normal. There is no tenderness.  Musculoskeletal: She exhibits no edema or tenderness.  Lymphadenopathy:    She has no cervical adenopathy.  Skin: No rash noted. No erythema.  Psychiatric: She has a normal mood and affect. Her behavior is normal.    BP 110/66 mmHg  Pulse 62  Temp(Src) 97.4 F (36.3 C) (Oral)  Ht 5' 6" (1.676 m)  Wt 169 lb (76.658 kg)  BMI 27.29 kg/m2  SpO2 97% Wt Readings from Last 3 Encounters:  07/12/14 169 lb (76.658 kg)  06/19/14 171 lb 12.8 oz (77.928 kg)  06/18/14 171 lb 8 oz (77.792 kg)     Lab Results  Component Value Date   WBC 6.7 07/05/2014   HGB 13.5 07/05/2014   HCT 40.7 07/05/2014   PLT 265 07/05/2014   GLUCOSE 111* 07/05/2014   CHOL 194 05/24/2014   TRIG 144.0 05/24/2014   HDL 51.70 05/24/2014   LDLCALC 114* 05/24/2014   ALT 31 05/24/2014   AST 24 05/24/2014   NA 142 07/05/2014   K 3.6 07/05/2014   CL 103 07/05/2014   CREATININE 0.69 07/05/2014   BUN 23* 07/05/2014   CO2 28 07/05/2014   TSH 0.65 08/28/2013   INR 0.91 07/05/2014   HGBA1C 6.0 05/24/2014       Assessment & Plan:   Problem List Items Addressed This Visit    Anemia    Recent cbc wnl.  Follow.        Depression    Stable.        Gastritis    EGD 09/15/13 - gastritis and hiatal hernia.  Continue protonix.       History of colonic polyps    Colonoscopy 09/15/13.  Recommended f/u colonoscopy in 09/2023.      Hyperparathyroidism, primary    S/p parathyroid surgery.  Seeing Dr Gerkin.       Hypertension - Primary    Blood pressure is doing well.  Same medication regimen.  Had recent met b checked and ok.  Will need close intra op and post op monitoring of her blood pressure and pulse rate to avoid extremes.        Pre-op evaluation    Will need close intraop and post op monitoring of heart rate and blood pressure to avoid extremes.  Given no symptoms, I feel she is at low cardiac risk to proceed with the planned surgery.        Right  knee pain    Persistent knee pain.  Has seen Dr Hooten.  Planning for TKR.              SCOTT, CHARLENE, MD   

## 2014-07-12 NOTE — Progress Notes (Signed)
Pre visit review using our clinic review tool, if applicable. No additional management support is needed unless otherwise documented below in the visit note. 

## 2014-07-16 ENCOUNTER — Encounter: Payer: Self-pay | Admitting: Internal Medicine

## 2014-07-16 DIAGNOSIS — Z01818 Encounter for other preprocedural examination: Secondary | ICD-10-CM | POA: Insufficient documentation

## 2014-07-16 NOTE — Assessment & Plan Note (Signed)
Stable

## 2014-07-16 NOTE — Assessment & Plan Note (Signed)
Recent cbc wnl.  Follow.

## 2014-07-16 NOTE — Assessment & Plan Note (Signed)
Blood pressure is doing well.  Same medication regimen.  Had recent met b checked and ok.  Will need close intra op and post op monitoring of her blood pressure and pulse rate to avoid extremes.

## 2014-07-16 NOTE — Assessment & Plan Note (Signed)
Colonoscopy 09/15/13.  Recommended f/u colonoscopy in 09/2023.

## 2014-07-16 NOTE — Assessment & Plan Note (Signed)
Will need close intraop and post op monitoring of heart rate and blood pressure to avoid extremes.  Given no symptoms, I feel she is at low cardiac risk to proceed with the planned surgery.

## 2014-07-16 NOTE — Assessment & Plan Note (Signed)
Persistent knee pain.  Has seen Dr Marry Guan.  Planning for TKR.

## 2014-07-16 NOTE — Assessment & Plan Note (Signed)
S/p parathyroid surgery.  Seeing Dr Harlow Asa.

## 2014-07-16 NOTE — Assessment & Plan Note (Signed)
EGD 09/15/13 - gastritis and hiatal hernia.  Continue protonix.

## 2014-07-17 ENCOUNTER — Ambulatory Visit
Admission: RE | Admit: 2014-07-17 | Discharge: 2014-07-17 | Disposition: A | Discharge status: Home / Self Care | Payer: Medicare Other | Source: Ambulatory Visit | Attending: Internal Medicine | Admitting: Internal Medicine

## 2014-07-17 ENCOUNTER — Inpatient Hospital Stay
Admission: RE | Admit: 2014-07-17 | Discharge: 2014-07-21 | DRG: 470 | Disposition: A | Discharge status: Skilled Nursing Facility | Payer: Medicare Other | Source: Ambulatory Visit | Attending: Orthopedic Surgery | Admitting: Orthopedic Surgery

## 2014-07-17 DIAGNOSIS — D649 Anemia, unspecified: Secondary | ICD-10-CM | POA: Diagnosis present

## 2014-07-17 DIAGNOSIS — E780 Pure hypercholesterolemia: Secondary | ICD-10-CM | POA: Diagnosis present

## 2014-07-17 DIAGNOSIS — Z87891 Personal history of nicotine dependence: Secondary | ICD-10-CM | POA: Diagnosis not present

## 2014-07-17 DIAGNOSIS — Z888 Allergy status to other drugs, medicaments and biological substances status: Secondary | ICD-10-CM

## 2014-07-17 DIAGNOSIS — Z780 Asymptomatic menopausal state: Secondary | ICD-10-CM

## 2014-07-17 DIAGNOSIS — M810 Age-related osteoporosis without current pathological fracture: Secondary | ICD-10-CM | POA: Diagnosis present

## 2014-07-17 DIAGNOSIS — M179 Osteoarthritis of knee, unspecified: Secondary | ICD-10-CM | POA: Diagnosis present

## 2014-07-17 DIAGNOSIS — F5104 Psychophysiologic insomnia: Secondary | ICD-10-CM | POA: Diagnosis present

## 2014-07-17 DIAGNOSIS — K219 Gastro-esophageal reflux disease without esophagitis: Secondary | ICD-10-CM | POA: Diagnosis present

## 2014-07-17 DIAGNOSIS — Z7982 Long term (current) use of aspirin: Secondary | ICD-10-CM

## 2014-07-17 DIAGNOSIS — R002 Palpitations: Secondary | ICD-10-CM | POA: Diagnosis present

## 2014-07-17 DIAGNOSIS — F419 Anxiety disorder, unspecified: Secondary | ICD-10-CM | POA: Diagnosis present

## 2014-07-17 DIAGNOSIS — F329 Major depressive disorder, single episode, unspecified: Secondary | ICD-10-CM | POA: Diagnosis present

## 2014-07-17 DIAGNOSIS — I10 Essential (primary) hypertension: Secondary | ICD-10-CM | POA: Diagnosis present

## 2014-07-17 DIAGNOSIS — R109 Unspecified abdominal pain: Secondary | ICD-10-CM | POA: Diagnosis not present

## 2014-07-17 DIAGNOSIS — R0602 Shortness of breath: Secondary | ICD-10-CM | POA: Diagnosis not present

## 2014-07-17 DIAGNOSIS — R011 Cardiac murmur, unspecified: Secondary | ICD-10-CM | POA: Diagnosis present

## 2014-07-17 DIAGNOSIS — M1990 Unspecified osteoarthritis, unspecified site: Secondary | ICD-10-CM | POA: Diagnosis present

## 2014-07-17 DIAGNOSIS — Z96651 Presence of right artificial knee joint: Secondary | ICD-10-CM

## 2014-07-17 DIAGNOSIS — J449 Chronic obstructive pulmonary disease, unspecified: Secondary | ICD-10-CM | POA: Diagnosis present

## 2014-07-17 DIAGNOSIS — E539 Vitamin B deficiency, unspecified: Secondary | ICD-10-CM | POA: Diagnosis present

## 2014-07-17 DIAGNOSIS — E210 Primary hyperparathyroidism: Secondary | ICD-10-CM | POA: Diagnosis present

## 2014-07-17 DIAGNOSIS — M545 Low back pain: Secondary | ICD-10-CM | POA: Diagnosis present

## 2014-07-17 DIAGNOSIS — Z791 Long term (current) use of non-steroidal anti-inflammatories (NSAID): Secondary | ICD-10-CM | POA: Diagnosis not present

## 2014-07-17 DIAGNOSIS — M8580 Other specified disorders of bone density and structure, unspecified site: Secondary | ICD-10-CM | POA: Insufficient documentation

## 2014-07-17 DIAGNOSIS — Z79899 Other long term (current) drug therapy: Secondary | ICD-10-CM | POA: Diagnosis not present

## 2014-07-17 DIAGNOSIS — Z96659 Presence of unspecified artificial knee joint: Secondary | ICD-10-CM

## 2014-07-18 ENCOUNTER — Encounter: Payer: Self-pay | Admitting: *Deleted

## 2014-07-18 ENCOUNTER — Inpatient Hospital Stay: Payer: Medicare Other | Admitting: Anesthesiology

## 2014-07-18 ENCOUNTER — Encounter
Admission: RE | Disposition: A | Discharge status: Skilled Nursing Facility | Payer: Self-pay | Source: Ambulatory Visit | Attending: Orthopedic Surgery

## 2014-07-18 ENCOUNTER — Inpatient Hospital Stay: Payer: Medicare Other

## 2014-07-18 DIAGNOSIS — Z96659 Presence of unspecified artificial knee joint: Secondary | ICD-10-CM

## 2014-07-18 HISTORY — PX: TOTAL KNEE ARTHROPLASTY: SHX125

## 2014-07-18 SURGERY — ARTHROPLASTY, KNEE, TOTAL
Anesthesia: Spinal | Laterality: Right | Wound class: Clean

## 2014-07-18 MED ORDER — CEFAZOLIN SODIUM-DEXTROSE 2-3 GM-% IV SOLR
2.0000 g | Freq: Once | INTRAVENOUS | Status: AC
  Administered 2014-07-18: 2 g via INTRAVENOUS

## 2014-07-18 MED ORDER — LACTATED RINGERS IV SOLN
INTRAVENOUS | Status: DC
  Administered 2014-07-18 (×2): via INTRAVENOUS

## 2014-07-18 MED ORDER — OMEGA 3 1000 MG PO CAPS
1000.0000 mg | ORAL_CAPSULE | Freq: Every day | ORAL | Status: DC
  Filled 2014-07-18 (×3): qty 1

## 2014-07-18 MED ORDER — SODIUM CHLORIDE 0.9 % IJ SOLN
INTRAMUSCULAR | Status: AC
  Filled 2014-07-18: qty 3

## 2014-07-18 MED ORDER — NEOMYCIN-POLYMYXIN B GU 40-200000 IR SOLN
Status: DC | PRN
  Administered 2014-07-18: 3516 mL

## 2014-07-18 MED ORDER — MAGNESIUM OXIDE 400 (241.3 MG) MG PO TABS
400.0000 mg | ORAL_TABLET | Freq: Every day | ORAL | Status: DC
  Administered 2014-07-19 – 2014-07-21 (×3): 400 mg via ORAL
  Filled 2014-07-18 (×3): qty 1

## 2014-07-18 MED ORDER — TRANEXAMIC ACID 1000 MG/10ML IV SOLN
1000.0000 mg | Freq: Once | INTRAVENOUS | Status: AC
  Administered 2014-07-18: 1000 mg via INTRAVENOUS
  Filled 2014-07-18: qty 10

## 2014-07-18 MED ORDER — ONDANSETRON HCL 4 MG/2ML IJ SOLN
INTRAMUSCULAR | Status: AC
  Filled 2014-07-18: qty 2

## 2014-07-18 MED ORDER — SODIUM CHLORIDE 0.9 % IJ SOLN: INTRAMUSCULAR | Status: DC | PRN

## 2014-07-18 MED ORDER — MIDAZOLAM HCL 5 MG/5ML IJ SOLN
INTRAMUSCULAR | Status: DC | PRN
  Administered 2014-07-18 (×2): 1 mg via INTRAVENOUS

## 2014-07-18 MED ORDER — FLEET ENEMA 7-19 GM/118ML RE ENEM: 1.0000 | ENEMA | Freq: Once | RECTAL | Status: AC | PRN

## 2014-07-18 MED ORDER — METOCLOPRAMIDE HCL 10 MG PO TABS
10.0000 mg | ORAL_TABLET | Freq: Three times a day (TID) | ORAL | Status: AC
  Administered 2014-07-18 – 2014-07-20 (×8): 10 mg via ORAL
  Filled 2014-07-18 (×9): qty 1

## 2014-07-18 MED ORDER — PHENYLEPHRINE HCL 10 MG/ML IJ SOLN
INTRAMUSCULAR | Status: DC | PRN
  Administered 2014-07-18 (×2): 50 ug via INTRAVENOUS
  Administered 2014-07-18: 100 ug via INTRAVENOUS
  Administered 2014-07-18: 50 ug via INTRAVENOUS
  Administered 2014-07-18 (×3): 100 ug via INTRAVENOUS
  Administered 2014-07-18 (×2): 50 ug via INTRAVENOUS

## 2014-07-18 MED ORDER — FERROUS SULFATE 325 (65 FE) MG PO TABS
325.0000 mg | ORAL_TABLET | Freq: Two times a day (BID) | ORAL | Status: DC
  Administered 2014-07-18 – 2014-07-21 (×6): 325 mg via ORAL
  Filled 2014-07-18 (×6): qty 1

## 2014-07-18 MED ORDER — FENTANYL CITRATE (PF) 100 MCG/2ML IJ SOLN
25.0000 ug | INTRAMUSCULAR | Status: AC | PRN
  Administered 2014-07-18 (×6): 25 ug via INTRAVENOUS

## 2014-07-18 MED ORDER — MAGNESIUM HYDROXIDE 400 MG/5ML PO SUSP
30.0000 mL | Freq: Every day | ORAL | Status: DC | PRN
  Administered 2014-07-19 – 2014-07-20 (×2): 30 mL via ORAL
  Filled 2014-07-18 (×2): qty 30

## 2014-07-18 MED ORDER — ALUM & MAG HYDROXIDE-SIMETH 200-200-20 MG/5ML PO SUSP: 30.0000 mL | ORAL | Status: DC | PRN

## 2014-07-18 MED ORDER — PANTOPRAZOLE SODIUM 40 MG PO TBEC
40.0000 mg | DELAYED_RELEASE_TABLET | Freq: Two times a day (BID) | ORAL | Status: DC
  Administered 2014-07-18 – 2014-07-21 (×6): 40 mg via ORAL
  Filled 2014-07-18 (×6): qty 1

## 2014-07-18 MED ORDER — POTASSIUM CHLORIDE CRYS ER 10 MEQ PO TBCR
10.0000 meq | EXTENDED_RELEASE_TABLET | Freq: Every day | ORAL | Status: DC
  Administered 2014-07-18 – 2014-07-21 (×4): 10 meq via ORAL
  Filled 2014-07-18 (×4): qty 1

## 2014-07-18 MED ORDER — ACETAMINOPHEN 10 MG/ML IV SOLN
1000.0000 mg | Freq: Four times a day (QID) | INTRAVENOUS | Status: AC
  Administered 2014-07-18 – 2014-07-19 (×4): 1000 mg via INTRAVENOUS
  Filled 2014-07-18 (×4): qty 100

## 2014-07-18 MED ORDER — CHLORTHALIDONE 25 MG PO TABS
25.0000 mg | ORAL_TABLET | Freq: Every day | ORAL | Status: DC
  Administered 2014-07-18 – 2014-07-21 (×4): 25 mg via ORAL
  Filled 2014-07-18 (×4): qty 1

## 2014-07-18 MED ORDER — TRAMADOL HCL 50 MG PO TABS
50.0000 mg | ORAL_TABLET | ORAL | Status: DC | PRN
  Administered 2014-07-19: 50 mg via ORAL
  Administered 2014-07-20: 100 mg via ORAL
  Administered 2014-07-20 (×2): 50 mg via ORAL
  Filled 2014-07-18: qty 1
  Filled 2014-07-18: qty 2
  Filled 2014-07-18 (×2): qty 1

## 2014-07-18 MED ORDER — ENOXAPARIN SODIUM 30 MG/0.3ML ~~LOC~~ SOLN
30.0000 mg | Freq: Two times a day (BID) | SUBCUTANEOUS | Status: DC
Start: 2014-07-19 — End: 2014-07-21
  Administered 2014-07-19 – 2014-07-21 (×5): 30 mg via SUBCUTANEOUS
  Filled 2014-07-18 (×5): qty 0.3

## 2014-07-18 MED ORDER — SODIUM CHLORIDE 0.9 % IV SOLN
INTRAVENOUS | Status: DC
  Administered 2014-07-18 – 2014-07-19 (×2): via INTRAVENOUS

## 2014-07-18 MED ORDER — BISACODYL 10 MG RE SUPP: 10.0000 mg | Freq: Every day | RECTAL | Status: DC | PRN

## 2014-07-18 MED ORDER — FENTANYL CITRATE (PF) 100 MCG/2ML IJ SOLN
INTRAMUSCULAR | Status: DC | PRN
  Administered 2014-07-18 (×2): 50 ug via INTRAVENOUS

## 2014-07-18 MED ORDER — SENNOSIDES-DOCUSATE SODIUM 8.6-50 MG PO TABS
1.0000 | ORAL_TABLET | Freq: Two times a day (BID) | ORAL | Status: DC
  Administered 2014-07-18 – 2014-07-20 (×4): 1 via ORAL
  Filled 2014-07-18 (×4): qty 1

## 2014-07-18 MED ORDER — ACETAMINOPHEN 325 MG PO TABS: 650.0000 mg | ORAL_TABLET | Freq: Four times a day (QID) | ORAL | Status: DC | PRN

## 2014-07-18 MED ORDER — TRANEXAMIC ACID 1000 MG/10ML IV SOLN
1000.0000 mg | INTRAVENOUS | Status: DC
  Filled 2014-07-18: qty 10

## 2014-07-18 MED ORDER — MENTHOL 3 MG MT LOZG
1.0000 | LOZENGE | OROMUCOSAL | Status: DC | PRN
  Filled 2014-07-18: qty 9

## 2014-07-18 MED ORDER — BIOTIN 5000 MCG PO CAPS: 1.0000 | ORAL_CAPSULE | Freq: Every day | ORAL | Status: DC

## 2014-07-18 MED ORDER — VITAMIN D 1000 UNITS PO TABS
2000.0000 [IU] | ORAL_TABLET | Freq: Every day | ORAL | Status: DC
  Administered 2014-07-18 – 2014-07-21 (×4): 2000 [IU] via ORAL
  Filled 2014-07-18 (×4): qty 2

## 2014-07-18 MED ORDER — SODIUM CHLORIDE 0.9 % IV SOLN
INTRAVENOUS | Status: DC | PRN
  Administered 2014-07-18: 60 mL

## 2014-07-18 MED ORDER — PHENOL 1.4 % MT LIQD
1.0000 | OROMUCOSAL | Status: DC | PRN
  Filled 2014-07-18: qty 177

## 2014-07-18 MED ORDER — IRBESARTAN 150 MG PO TABS
300.0000 mg | ORAL_TABLET | Freq: Every day | ORAL | Status: DC
  Administered 2014-07-18 – 2014-07-21 (×4): 300 mg via ORAL
  Filled 2014-07-18 (×4): qty 2

## 2014-07-18 MED ORDER — ATORVASTATIN CALCIUM 20 MG PO TABS
40.0000 mg | ORAL_TABLET | Freq: Once | ORAL | Status: AC
  Administered 2014-07-18: 40 mg via ORAL
  Filled 2014-07-18: qty 2

## 2014-07-18 MED ORDER — PROPOFOL INFUSION 10 MG/ML OPTIME
INTRAVENOUS | Status: DC | PRN
  Administered 2014-07-18: 42 ug/kg/min via INTRAVENOUS
  Administered 2014-07-18: 50 ug/kg/min via INTRAVENOUS

## 2014-07-18 MED ORDER — MORPHINE SULFATE 2 MG/ML IJ SOLN
2.0000 mg | INTRAMUSCULAR | Status: DC | PRN
  Administered 2014-07-18: 2 mg via INTRAVENOUS
  Filled 2014-07-18: qty 1

## 2014-07-18 MED ORDER — PROPOFOL 10 MG/ML IV BOLUS
INTRAVENOUS | Status: DC | PRN
  Administered 2014-07-18 (×2): 13 mg via INTRAVENOUS

## 2014-07-18 MED ORDER — CEFAZOLIN SODIUM-DEXTROSE 2-3 GM-% IV SOLR
INTRAVENOUS | Status: AC
  Filled 2014-07-18: qty 50

## 2014-07-18 MED ORDER — ACETAMINOPHEN 10 MG/ML IV SOLN
INTRAVENOUS | Status: DC | PRN
  Administered 2014-07-18: 1000 mg via INTRAVENOUS

## 2014-07-18 MED ORDER — GABAPENTIN 300 MG PO CAPS
600.0000 mg | ORAL_CAPSULE | Freq: Every day | ORAL | Status: DC
  Administered 2014-07-18 – 2014-07-20 (×3): 600 mg via ORAL
  Filled 2014-07-18 (×4): qty 2

## 2014-07-18 MED ORDER — CEFAZOLIN SODIUM-DEXTROSE 2-3 GM-% IV SOLR
2.0000 g | Freq: Four times a day (QID) | INTRAVENOUS | Status: AC
  Administered 2014-07-18 – 2014-07-19 (×4): 2 g via INTRAVENOUS
  Filled 2014-07-18 (×4): qty 50

## 2014-07-18 MED ORDER — TRANEXAMIC ACID 1000 MG/10ML IV SOLN
1000.0000 mg | INTRAVENOUS | Status: DC | PRN
  Administered 2014-07-18: 1000 mg via INTRAVENOUS

## 2014-07-18 MED ORDER — OXYCODONE HCL 5 MG PO TABS
5.0000 mg | ORAL_TABLET | ORAL | Status: DC | PRN
  Administered 2014-07-18 – 2014-07-19 (×3): 5 mg via ORAL
  Administered 2014-07-20 – 2014-07-21 (×5): 10 mg via ORAL
  Filled 2014-07-18 (×2): qty 1
  Filled 2014-07-18 (×4): qty 2
  Filled 2014-07-18: qty 1
  Filled 2014-07-18: qty 2

## 2014-07-18 MED ORDER — ONDANSETRON HCL 4 MG/2ML IJ SOLN
4.0000 mg | Freq: Once | INTRAMUSCULAR | Status: AC | PRN
  Administered 2014-07-18: 4 mg via INTRAVENOUS

## 2014-07-18 MED ORDER — AMLODIPINE BESYLATE 5 MG PO TABS
5.0000 mg | ORAL_TABLET | Freq: Every day | ORAL | Status: DC
  Administered 2014-07-18 – 2014-07-21 (×4): 5 mg via ORAL
  Filled 2014-07-18 (×4): qty 1

## 2014-07-18 MED ORDER — ACETAMINOPHEN 650 MG RE SUPP: 650.0000 mg | Freq: Four times a day (QID) | RECTAL | Status: DC | PRN

## 2014-07-18 MED ORDER — BUPIVACAINE-EPINEPHRINE 0.25% -1:200000 IJ SOLN
INTRAMUSCULAR | Status: DC | PRN
  Administered 2014-07-18: 30 mL

## 2014-07-18 MED ORDER — ONDANSETRON HCL 4 MG/2ML IJ SOLN: 4.0000 mg | Freq: Four times a day (QID) | INTRAMUSCULAR | Status: DC | PRN

## 2014-07-18 MED ORDER — ONDANSETRON HCL 4 MG PO TABS: 4.0000 mg | ORAL_TABLET | Freq: Four times a day (QID) | ORAL | Status: DC | PRN

## 2014-07-18 SURGICAL SUPPLY — 59 items
AUTOTRANSFUS HAS 1/8 (MISCELLANEOUS) ×2
BATTERY INSTRU NAVIGATION (MISCELLANEOUS) ×8 IMPLANT
BLADE SAW 1 (BLADE) ×2 IMPLANT
BLADE SAW 1/2 (BLADE) ×2 IMPLANT
CANISTER SUCT 1200ML W/VALVE (MISCELLANEOUS) ×2 IMPLANT
CANISTER SUCT 3000ML (MISCELLANEOUS) ×4 IMPLANT
CAP KNEE TOTAL 3 SIGMA ×2 IMPLANT
CATH TRAY 16F METER LATEX (MISCELLANEOUS) ×2 IMPLANT
CEMENT HV SMART SET (Cement) ×4 IMPLANT
COOLER POLAR GLACIER W/PUMP (MISCELLANEOUS) ×2 IMPLANT
DRAPE INCISE IOBAN 66X45 STRL (DRAPES) ×2 IMPLANT
DRAPE SHEET LG 3/4 BI-LAMINATE (DRAPES) ×2 IMPLANT
DRESSING ALLEVYN LIFE SACRUM (GAUZE/BANDAGES/DRESSINGS) ×2 IMPLANT
DRSG DERMACEA 8X12 NADH (GAUZE/BANDAGES/DRESSINGS) ×2 IMPLANT
DRSG OPSITE POSTOP 4X14 (GAUZE/BANDAGES/DRESSINGS) ×2 IMPLANT
DURAPREP 26ML APPLICATOR (WOUND CARE) ×4 IMPLANT
ELECT CAUTERY BLADE 6.4 (BLADE) ×2 IMPLANT
EX-PIN ORTHOLOCK NAV 4X150 (PIN) ×4 IMPLANT
GLOVE BIO SURGEON STRL SZ7 (GLOVE) ×4 IMPLANT
GLOVE BIOGEL M STRL SZ7.5 (GLOVE) ×4 IMPLANT
GLOVE INDICATOR 8.0 STRL GRN (GLOVE) ×2 IMPLANT
GLOVE SURG 9.0 ORTHO LTXF (GLOVE) ×2 IMPLANT
GLOVE SURG ORTHO 9.0 STRL STRW (GLOVE) ×2 IMPLANT
GOWN STRL REUS W/ TWL LRG LVL3 (GOWN DISPOSABLE) ×1 IMPLANT
GOWN STRL REUS W/TWL 2XL LVL3 (GOWN DISPOSABLE) ×2 IMPLANT
GOWN STRL REUS W/TWL LRG LVL3 (GOWN DISPOSABLE) ×1
GOWN STRL REUS W/TWL XL LVL4 (GOWN DISPOSABLE) ×2 IMPLANT
HANDPIECE SUCTION TUBG SURGILV (MISCELLANEOUS) ×2 IMPLANT
HOLDER FOLEY CATH W/STRAP (MISCELLANEOUS) ×2 IMPLANT
HOOD PEEL AWAY FACE SHEILD DIS (HOOD) ×4 IMPLANT
KNIFE SCULPS 14X20 (INSTRUMENTS) ×2 IMPLANT
NDL SAFETY 18GX1.5 (NEEDLE) ×2 IMPLANT
NEEDLE SPNL 18GX3.5 QUINCKE PK (NEEDLE) ×2 IMPLANT
NEEDLE SPNL 20GX3.5 QUINCKE YW (NEEDLE) ×2 IMPLANT
NS IRRIG 500ML POUR BTL (IV SOLUTION) ×2 IMPLANT
PACK TOTAL KNEE (MISCELLANEOUS) ×2 IMPLANT
PAD GROUND ADULT SPLIT (MISCELLANEOUS) ×2 IMPLANT
PAD WRAPON POLAR KNEE (MISCELLANEOUS) ×1 IMPLANT
PIN DRILL QUICK PACK ×2 IMPLANT
PIN FIXATION 1/8DIA X 3INL (PIN) IMPLANT
SOL .9 NS 3000ML IRR  AL (IV SOLUTION) ×1
SOL .9 NS 3000ML IRR UROMATIC (IV SOLUTION) ×1 IMPLANT
SOL PREP PVP 2OZ (MISCELLANEOUS) ×2
SOLUTION PREP PVP 2OZ (MISCELLANEOUS) ×1 IMPLANT
SPONGE DRAIN TRACH 4X4 STRL 2S (GAUZE/BANDAGES/DRESSINGS) ×2 IMPLANT
STAPLER SKIN PROX 35W (STAPLE) ×2 IMPLANT
STRAP SAFETY BODY (MISCELLANEOUS) ×2 IMPLANT
SUCTION FRAZIER TIP 10 FR DISP (SUCTIONS) ×2 IMPLANT
SUT VIC AB 0 CT1 36 (SUTURE) ×2 IMPLANT
SUT VIC AB 1 CT1 36 (SUTURE) ×4 IMPLANT
SUT VIC AB 2-0 CT2 27 (SUTURE) ×2 IMPLANT
SYR 20CC LL (SYRINGE) ×2 IMPLANT
SYR 30ML LL (SYRINGE) ×2 IMPLANT
SYR 50ML LL SCALE MARK (SYRINGE) ×2 IMPLANT
SYSTEM AUTOTRANSFUS DUAL TROCR (MISCELLANEOUS) ×1 IMPLANT
TOWEL OR 17X26 4PK STRL BLUE (TOWEL DISPOSABLE) ×2 IMPLANT
TOWER CARTRIDGE SMART MIX (DISPOSABLE) ×2 IMPLANT
WATER STERILE IRR 1000ML POUR (IV SOLUTION) ×2 IMPLANT
WRAPON POLAR PAD KNEE (MISCELLANEOUS) ×2

## 2014-07-18 NOTE — Op Note (Signed)
DATE OF SURGERY:  07/18/2014  PATIENT NAME:  [name]   DOB: 1945-06-28  MRN: [id number]  PRE-OPERATIVE DIAGNOSIS: Degenerative arthrosis of the right knee, primary  POST-OPERATIVE DIAGNOSIS:  Same  PROCEDURE:  Right total knee arthroplasty using computer-assisted navigation  SURGEON:  Marciano Sequin. M.D.  ASSISTANT:  Vance Peper, PA (present and scrubbed throughout the case, critical for assistance with exposure, retraction, instrumentation, and closure)  ANESTHESIA: spinal  ESTIMATED BLOOD LOSS: 50 mL  FLUIDS REPLACED: 1900 mL of crystalloid  TOURNIQUET TIME: 90 minutes  DRAINS: 2 medium drains to a reinfusion system  SOFT TISSUE RELEASES: Anterior cruciate ligament, posterior cruciate ligament, deep medial collateral ligament, patellofemoral ligament   IMPLANTS UTILIZED: DePuy PFC Sigma size 3 posterior stabilized femoral component (cemented), size 2.5 MBT tibial component (cemented), 35 mm 3 peg oval dome patella (cemented), and a 12.5 mm stabilized rotating platform polyethylene insert.  INDICATIONS FOR SURGERY: [name] is a 69 y.o. year old female with a long history of progressive knee pain. X-rays demonstrated severe degenerative changes in tricompartmental fashion. He had not seen any significant improvement despite conservative nonsurgical intervention. After discussion of the risks and benefits of surgical intervention, the patient expressed understanding of the risks benefits and agree with plans for total knee arthroplasty.   The risks, benefits, and alternatives were discussed at length including but not limited to the risks of infection, bleeding, nerve injury, stiffness, blood clots, the need for revision surgery, cardiopulmonary complications, among others, and they were willing to proceed.  PROCEDURE IN DETAIL: The patient was brought into the operating room and, after adequate spinal anesthesia was achieved, a tourniquet was placed on the  patient's upper thigh. The patient's knee and leg were cleaned and prepped with alcohol and DuraPrep and draped in the usual sterile fashion. A "timeout" was performed as per usual protocol. The lower extremity was exsanguinated using an Esmarch, and the tourniquet was inflated to 300 mmHg. An anterior longitudinal incision was made followed by a standard mid vastus approach. The deep fibers of the medial collateral ligament were elevated in a subperiosteal fashion off of the medial flare of the tibia so as to maintain a continuous soft tissue sleeve. The patella was subluxed laterally and the patellofemoral ligament was incised. Inspection of the knee demonstrated severe degenerative changes with full-thickness loss of articular cartilage. Osteophytes were debrided using a rongeur. Anterior and posterior cruciate ligaments were excised. Two 4.0 mm Schanz pins were inserted in the femur and into the tibia for attachment of the array of trackers used for computer-assisted navigation. Hip center was identified using a circumduction technique. Distal landmarks were mapped using the computer. The distal femur and proximal tibia were mapped using the computer. The distal femoral cutting guide was positioned using computer-assisted navigation so as to achieve a 5 distal valgus cut. The femur was sized and it was felt that a size 3 femoral component was appropriate. A size 3 femoral cutting guide was positioned and the anterior cut was performed and verified using the computer. This was followed by completion of the posterior and chamfer cuts. Femoral cutting guide for the central box was then positioned in the center box cut was performed.  Attention was then directed to the proximal tibia. Medial and lateral menisci were excised. The extramedullary tibial cutting guide was positioned using computer-assisted navigation so as to achieve a 0 varus-valgus alignment and 0 posterior slope. The cut was performed and  verified using the computer. The  proximal tibia was sized and it was felt that a size 2.5 tibial tray was appropriate. Tibial and femoral trials were inserted followed by insertion of first a 10 and subsequently a 12.5 mm polyethylene insert. This allowed for excellent mediolateral soft tissue balancing both in flexion and in full extension. Finally, the patella was cut and prepared so as to accommodate a 35 mm 3 peg oval dome patella. A patella trial was placed and the knee was placed through a range of motion with excellent patellar tracking appreciated. The femoral trial was removed after debridement of posterior osteophytes. The central post-hole for the tibial component was reamed followed by insertion of a keel punch. Tibial trials were then removed. Cut surfaces of bone were irrigated with copious amounts of normal saline with antibiotic solution using pulsatile lavage and then suctioned dry. Polymethylmethacrylate cement was prepared in the usual fashion using a vacuum mixer. Cement was applied to the cut surface of the proximal tibia as well as along the undersurface of a size 2.5 MBT tibial component. Tibial component was positioned and impacted into place. Excess cement was removed using [employer] fast streamer. Cement was then applied to the cut surfaces of the femur as well as along the posterior flanges of the size 3 femoral component. The femoral component was positioned and impacted into place. Excess cement was removed using [employer] fast streamer. A 12.5 mm polyethylene trial was inserted and the knee was brought into full extension with steady axial compression applied. Finally, cement was applied to the backside of a 35 mm 3 peg oval dome patella and the patellar component was positioned and patellar clamp applied. Excess cement was removed using [employer] fast streamer. After adequate curing of the cement, the tourniquet was deflated after a total tourniquet time of 90 minutes. Hemostasis was achieved using  electrocautery. The knee was irrigated with copious amounts of normal saline with antibiotic solution using pulsatile lavage and then suctioned dry. 20 mL of 1.3% Exparel in 40 mL of normal saline was injected along the posterior capsule, medial and lateral gutters, and along the arthrotomy site. A 12.5 mm stabilized rotating platform polyethylene insert was inserted and the knee was placed through a range of motion with excellent mediolateral soft tissue balancing appreciated and excellent patellar tracking noted. 2 medium drains were placed in the wound bed and brought out through separate stab incisions to be attached to a reinfusion system. The medial parapatellar portion of the incision was reapproximated using interrupted sutures of #1 Vicryl. Subcutaneous tissue was then injected with a total of 30 cc of 0.25% Marcaine with epinephrine. Subcutaneous tissue was approximated in layers using first #0 Vicryl followed #2-0 Vicryl. The skin was approximated with skin staples. A sterile dressing was applied.  The patient tolerated the procedure well and was transported to the recovery room in stable condition.    James P. Holley Bouche., M.D.

## 2014-07-18 NOTE — Transfer of Care (Signed)
Immediate Anesthesia Transfer of Care Note  Patient: [name]  Procedure(s) Performed: Procedure(s): TOTAL KNEE ARTHROPLASTY (Right)  Patient Location: PACU  Anesthesia Type:Spinal  Level of Consciousness: awake, alert  and oriented  Airway & Oxygen Therapy: Patient Spontanous Breathing  Post-op Assessment: Report given to RN  Post vital signs: Reviewed  Last Vitals:  Filed Vitals:   07/18/14 1526  BP: 121/84  Pulse: 86  Temp: 36.2 C  Resp: 18    Complications: No apparent anesthesia complications

## 2014-07-18 NOTE — OR Nursing (Signed)
Mrs Fair stated she used all 5 packages of SAGE wipes and mupiricon ointment for 5 days prior to surgery starting on 07/13/14, did the CHG showers last night and his morning.

## 2014-07-18 NOTE — Anesthesia Preprocedure Evaluation (Signed)
Anesthesia Evaluation  Patient identified by MRN, date of birth, ID band Patient awake    Reviewed: Allergy & Precautions, NPO status , Patient's Chart, lab work & pertinent test results  Airway Mallampati: II  TM Distance: >3 FB Neck ROM: Full    Dental  (+) Implants   Pulmonary COPD (hx of O2 need with hip fx)former smoker (quit x 6 yrs),          Cardiovascular hypertension, + dysrhythmias (palpatations) + Valvular Problems/Murmurs (as a child)     Neuro/Psych Anxiety Depression    GI/Hepatic GERD-  Medicated and Controlled,  Endo/Other    Renal/GU      Musculoskeletal   Abdominal   Peds  Hematology  (+) anemia ,   Anesthesia Other Findings   Reproductive/Obstetrics                             Anesthesia Physical Anesthesia Plan  ASA: III  Anesthesia Plan: Spinal   Post-op Pain Management:    Induction: Intravenous  Airway Management Planned: Nasal Cannula  Additional Equipment:   Intra-op Plan:   Post-operative Plan:   Informed Consent: I have reviewed the patients History and Physical, chart, labs and discussed the procedure including the risks, benefits and alternatives for the proposed anesthesia with the patient or authorized representative who has indicated his/her understanding and acceptance.     Plan Discussed with:   Anesthesia Plan Comments:         Anesthesia Quick Evaluation

## 2014-07-18 NOTE — Brief Op Note (Signed)
07/18/2014  3:25 PM  PATIENT:  [name]  69 y.o. female  PRE-OPERATIVE DIAGNOSIS:  DEGENERATIVE OSTEOARTRITIS right knee  POST-OPERATIVE DIAGNOSIS:  DEGENERATIVE OSTEOARTRITIS right knee  PROCEDURE:  Procedure(s): TOTAL KNEE ARTHROPLASTY (Right) using computer-assisted navigation  SURGEON:  Surgeon(s) and Role:    * Dereck Leep, MD - Primary  ASSISTANTS: Vance Peper, PA   ANESTHESIA:   spinal  EBL:  Total I/O In: 1900 [I.V.:1900] Out: 350 [Urine:300; Blood:50]  BLOOD ADMINISTERED:none  DRAINS: 2 drains to reinfusion system   LOCAL MEDICATIONS USED:  MARCAINE    and OTHER Exparel  SPECIMEN:  No Specimen  DISPOSITION OF SPECIMEN:  N/A  COUNTS:  YES  TOURNIQUET:  90 min  DICTATION: .Dragon Dictation  PLAN OF CARE: Admit to inpatient   PATIENT DISPOSITION:  PACU - hemodynamically stable.   Delay start of Pharmacological VTE agent (>24hrs) due to surgical blood loss or risk of bleeding: yes

## 2014-07-18 NOTE — H&P (Signed)
The patient has been re-examined, and the chart reviewed, and there have been no interval changes to the documented history and physical.    The risks, benefits, and alternatives have been discussed at length, and the patient is willing to proceed.   

## 2014-07-18 NOTE — Anesthesia Postprocedure Evaluation (Signed)
  Anesthesia Post-op Note  Patient: [name]  Procedure(s) Performed: Procedure(s): TOTAL KNEE ARTHROPLASTY (Right)  Anesthesia type:Spinal  Patient location: PACU  Post pain: Pain level controlled  Post assessment: Post-op Vital signs reviewed, Patient's Cardiovascular Status Stable, Respiratory Function Stable, Patent Airway and No signs of Nausea or vomiting  Post vital signs: Reviewed and stable  Last Vitals:  Filed Vitals:   07/18/14 1526  BP: 121/84  Pulse: 86  Temp: 36.2 C  Resp: 18    Level of consciousness: awake, alert  and patient cooperative  Complications: No apparent anesthesia complications

## 2014-07-18 NOTE — Anesthesia Procedure Notes (Signed)
Spinal Patient location during procedure: OR Start time: 07/18/2014 12:07 PM End time: 07/18/2014 12:11 PM Staffing Performed by: anesthesiologist  Preanesthetic Checklist Completed: patient identified, site marked, surgical consent, pre-op evaluation, timeout performed, IV checked, risks and benefits discussed and monitors and equipment checked Spinal Block Patient position: sitting Prep: Betadine Patient monitoring: heart rate, cardiac monitor, continuous pulse ox and blood pressure Approach: midline Location: L3-4 Needle Needle type: Whitacre  Needle gauge: 25 G Needle length: 9 cm Needle insertion depth: 6 cm

## 2014-07-19 ENCOUNTER — Encounter
Admission: RE | Admit: 2014-07-19 | Discharge: 2014-07-19 | Disposition: A | Discharge status: Home / Self Care | Payer: Medicare Other | Source: Ambulatory Visit | Attending: Internal Medicine | Admitting: Internal Medicine

## 2014-07-19 LAB — BASIC METABOLIC PANEL
ANION GAP: 8 (ref 5–15)
BUN: 18 mg/dL (ref 6–20)
CO2: 28 mmol/L (ref 22–32)
Calcium: 8.6 mg/dL — ABNORMAL LOW (ref 8.9–10.3)
Chloride: 104 mmol/L (ref 101–111)
Creatinine, Ser: 0.69 mg/dL (ref 0.44–1.00)
GFR calc non Af Amer: >60 mL/min (ref >60)
Glucose, Bld: 123 mg/dL — ABNORMAL HIGH (ref 65–99)
Potassium: 3.1 mmol/L — ABNORMAL LOW (ref 3.5–5.1)
Sodium: 140 mmol/L (ref 135–145)

## 2014-07-19 LAB — CBC
HCT: 35.4 % (ref 35.0–47.0)
Hemoglobin: 11.8 g/dL — ABNORMAL LOW (ref 12.0–16.0)
MCH: 32.1 pg (ref 26.0–34.0)
MCHC: 33.4 g/dL (ref 32.0–36.0)
MCV: 96 fL (ref 80.0–100.0)
Platelets: 249 10*3/uL (ref 150–440)
RBC: 3.68 MIL/uL — ABNORMAL LOW (ref 3.80–5.20)
RDW: 13.3 % (ref 11.5–14.5)
WBC: 9.5 10*3/uL (ref 3.6–11.0)

## 2014-07-19 MED ORDER — OMEGA-3-ACID ETHYL ESTERS 1 G PO CAPS
1.0000 g | ORAL_CAPSULE | Freq: Every day | ORAL | Status: DC
  Administered 2014-07-20 – 2014-07-21 (×2): 1 g via ORAL
  Filled 2014-07-19: qty 1

## 2014-07-19 MED FILL — Sodium Chloride Irrigation Soln 0.9%: Qty: 3000 | Status: AC

## 2014-07-19 MED FILL — Sodium Chloride Irrigation Soln 0.9%: Qty: 500 | Status: AC

## 2014-07-19 MED FILL — Neomycin-Polymyxin B GU Irrigation Soln: Qty: 16 | Status: AC

## 2014-07-19 NOTE — Care Management Note (Addendum)
Case Management Note  Patient Details  Name: ARSENIA GORACKE MRN: [id number] Date of Birth: March 22, 1945  Subjective/Objective:                  Met with patient to discuss discharge planning. She states she lives alone, no DME at home, supportive family, She uses [facility] for Rx. She has used outpatient PT in the past.She wants to go to [facility] at discharge.                     Expected Discharge Plan:     In-House Referral:     Discharge planning Services  CM Consult  Post Acute Care Choice:    Choice offered to:  Patient, Adult Children  DME Arranged:  [place] rolling DME Agency:  [place]:    [place]:     Status of Service:     Medicare Important Message Given:  Yes Date Medicare IM Given:    Medicare IM give by:    Date Additional Medicare IM Given:    Additional Medicare Important Message give by:     If discussed at [place] of Stay Meetings, dates discussed:    Additional Comments:  Marshell Garfinkel, RN 07/19/2014, 8:51 AM

## 2014-07-19 NOTE — Clinical Social Work Note (Signed)
Clinical Social Work Assessment  Patient Details  Name: [name] MRN: [id number] Date of Birth: Apr 07, 1945  Date of referral:  07/19/14               Reason for consult:  Facility Placement                Permission sought to share information with:  Chartered certified accountant granted to share information::  Yes, Verbal Permission Granted  Name::      IT sales professional::   [place]  Relationship::     Contact Information:     Housing/Transportation Living arrangements for the past 2 months:  Apartment Source of Information:  Patient Patient Interpreter Needed:  None Criminal Activity/Legal Involvement Pertinent to Current Situation/Hospitalization:  No - Comment as needed Significant Relationships:  Adult Children Lives with:  Self Do you feel safe going back to the place where you live?  Yes Need for family participation in patient care:  Yes (Comment)  Care giving concerns:  Patient lives alone in an apartment in a senior community.    Social Worker assessment / plan:  Holiday representative (CSW) received SNF consult. CSW met with patient and introduced self. Patient was laying in the bed making good eye contact. Patient was oriented X 3 and pleasant. Patient reported that she lives alone in an apartment in a senior community owned by [employer] Patient reported that her daughter in law is Chief of Staff at [employer]. CSW explained that PT is recommending SNF. Patient is agreeable to SNF and prefers [employer]   [facility] made a bed offer. CSW started [facility] authorization today.   Employment status:  Retired Nurse, adult PT Recommendations:  [facility] / Referral to community resources:  [place]  Patient/Family's Response to care:  Patient is agreeable to going to [place].   Patient/Family's Understanding of and Emotional  Response to Diagnosis, Current Treatment, and Prognosis:  Patient was pleasant and thanked CSW for visit.   Emotional Assessment Appearance:  Appears stated age Attitude/Demeanor/Rapport:    Affect (typically observed):  Accepting, Pleasant, Calm Orientation:  Oriented to Self, Oriented to Place, Oriented to  Time, Oriented to Situation Alcohol / Substance use:  Not Applicable Psych involvement (Current and /or in the community):  No (Comment)  Discharge Needs  Concerns to be addressed:  Other (Comment Required ([place] ) Readmission within the last 30 days:  No Current discharge risk:  None Barriers to Discharge:  Continued Medical Work up  [employer], [place] [phone]  Elwyn Reach 07/19/2014, 4:45 PM

## 2014-07-19 NOTE — Progress Notes (Signed)
   Subjective: 1 Day Post-Op Procedure(s) (LRB): TOTAL KNEE ARTHROPLASTY (Right) Patient reports pain as 6 on 0-10 scale.   Patient is well, and has had no acute complaints or problems We will start therapy today.  Plan is to go Rehab after hospital stay. Would like to go to [facility]. no nausea and no vomiting Patient denies any chest pains or shortness of breath. Patient complaining of abdominal pain diffusely. No specific area of discomfort Objective: Vital signs in last 24 hours: Temp:  [96.7 F (35.9 C)-98.8 F (37.1 C)] 98.8 F (37.1 C) (06/09 0455) Pulse Rate:  [50-86] 65 (06/09 0455) Resp:  [12-18] 18 (06/09 0455) BP: (111-144)/(61-84) 126/72 mmHg (06/09 0455) SpO2:  [91 %-100 %] 91 % (06/09 0455) Weight:  [75.66 kg (166 lb 12.8 oz)] 75.66 kg (166 lb 12.8 oz) (06/08 1701) Patient still has original dressing in place Heels are non tender and elevated off the bed using rolled towels Intake/Output from previous day: 06/08 0701 - 06/09 0700 In: 3680 [P.O.:480; I.V.:2900; IV Piggyback:300] Out: 2320 [Urine:2200; Drains:70; Blood:50] Intake/Output this shift:     Recent Labs  07/19/14 0530  HGB 11.8*    Recent Labs  07/19/14 0530  WBC 9.5  RBC 3.68*  HCT 35.4  PLT 249   No results for input(s): NA, K, CL, CO2, BUN, CREATININE, GLUCOSE, CALCIUM in the last 72 hours. No results for input(s): LABPT, INR in the last 72 hours.  EXAM General - Patient is Alert, Appropriate and Oriented Extremity - Neurologically intact Neurovascular intact Sensation intact distally Intact pulses distally Dorsiflexion/Plantar flexion intact Dressing - dressing C/D/I Motor Function - intact, moving foot and toes well on exam. Patient able to do partial short leg raise on her own. Has good gross motor strength against resistance of dorsiflexion and plantarflexion of the ankle.  Past Medical History  Diagnosis Date  . Hypertension   . Anxiety   . Depression   . GERD  (gastroesophageal reflux disease)   . Hypercholesterolemia   . Chronic insomnia   . COPD (chronic obstructive pulmonary disease)     documented in outside records  . Arthritis   . Heart murmur   . Osteoporosis   . Complication of anesthesia     "AFFECTS MY MEMORY"  . Shortness of breath     with exertion  . Tingling     both hands  . History of stomach ulcers   . Hyperparathyroidism, primary   . Burning feet syndrome   . Dysrhythmia     PALPITATIONS     Assessment/Plan: 1 Day Post-Op Procedure(s) (LRB): TOTAL KNEE ARTHROPLASTY (Right) Active Problems:   Total knee replacement status  Estimated body mass index is 26.94 kg/(m^2) as calculated from the following:   Height as of this encounter: 5\' 6"  (1.676 m).   Weight as of this encounter: 75.66 kg (166 lb 12.8 oz). Advance diet Up with therapy Discharge to SNF  Labs: Were reviewed. Additional labs pending tomorrow morning DVT Prophylaxis - Lovenox, Foot Pumps and TED hose Weight-Bearing as tolerated to right leg D/C O2 and Pulse OX and try on Room Air  Dontario Evetts R. [place] 07/19/2014, 7:05 AM

## 2014-07-19 NOTE — Progress Notes (Signed)
Physical Therapy Treatment Patient Details Name: [name] MRN: [id number] DOB: [birth year] Today's Date: 07/19/2014    History of Present Illness This patient is a 69 year old female who came to [facility] for a right TKR.    PT Comments    Pt is making good progress towards goals this date with increased ambulation distance noted. Good tolerance with there-ex. Pt is somewhat impulsive and requires cues for safety.  Follow Up Recommendations  SNF     Equipment Recommendations       Recommendations for Other Services       Precautions / Restrictions Precautions Precautions: Knee Precaution Booklet Issued: No Restrictions Weight Bearing Restrictions: Yes RLE Weight Bearing: Weight bearing as tolerated    Mobility  Bed Mobility Overal bed mobility: Needs Assistance Bed Mobility: Sit to Supine       Sit to supine: Min assist   General bed mobility comments: sit->supine with safe technique and min assist. Pt slightly impulsive, requiring cues for waiting for therapist.  Transfers Overall transfer level: Needs assistance Equipment used: Rolling walker (2 wheeled) Transfers: Sit to/from Stand Sit to Stand: Min assist         General transfer comment: sit<>Stand with min assist and rw. Safe technique performed with cues for sequencing given. Pt able to WB through R LE.  Ambulation/Gait Ambulation/Gait assistance: Min guard Ambulation Distance (Feet): 50 Feet Assistive device: Rolling walker (2 wheeled) Gait Pattern/deviations: Step-through pattern     General Gait Details: ambulated using rw and cga with mod cues for sequencing and safety. Cues for upright posture.   Stairs            Wheelchair Mobility    Modified Rankin (Stroke Patients Only)       Balance                                    Cognition Arousal/Alertness: Awake/alert Behavior During Therapy: WFL for tasks assessed/performed Overall Cognitive Status: Within  Functional Limits for tasks assessed                      Exercises Other Exercises Other Exercises: Pt performed R knee ther-ex including 10 reps of ankle pumps,  SAQ, quad sets, SLRs, and hip abd/add. All ther-ex performed with min assist and cues for correct technique.    General Comments        Pertinent Vitals/Pain Pain Assessment: 0-10 Pain Score: 8  Pain Location: R knee Pain Descriptors / Indicators: Aching Pain Intervention(s): Limited activity within patient's tolerance;Premedicated before session    Home Living Family/patient expects to be discharged to:: Skilled nursing facility Living Arrangements: Alone                  Prior Function Level of Independence: Independent          PT Goals (current goals can now be found in the care plan section) Acute Rehab PT Goals Patient Stated Goal: to go to rehab PT Goal Formulation: With patient Time For Goal Achievement: 08/02/14 Potential to Achieve Goals: Good Progress towards PT goals: Progressing toward goals    Frequency  BID    PT Plan Current plan remains appropriate    Co-evaluation             End of Session Equipment Utilized During Treatment: Gait belt Activity Tolerance: Patient tolerated treatment well Patient left: in bed;with bed alarm  set     Time: 1535-1601 PT Time Calculation (min) (ACUTE ONLY): 26 min  Charges:  $Gait Training: 8-22 mins $Therapeutic Exercise: 8-22 mins                    G Codes:      [name] Jul 27, 2014, 4:34 PM  [name], PT, DPT [phone]

## 2014-07-19 NOTE — Evaluation (Signed)
Occupational Therapy Evaluation Patient Details Name: [name] MRN: [id number] DOB: 05/05/1945 Today's Date: 07/19/2014    History of Present Illness This patient is a 69 year old female who came to [facility] for a right TKR.   Clinical Impression   This patient is a 69 year old female who came to [facility] for a right total knee replacement.  Patient lives in a 2 story home.  She had been independent with ADL and functional mobility. She now requires minimal assistance for lower body dressing, practicing Donned/doffed socks and pants to knees (drain still in place) using hip kit. She would benefit from Occupational Therapy for ADL/functioal mobility training.       Follow Up Recommendations  SNF    Equipment Recommendations       Recommendations for Other Services       Precautions / Restrictions Precautions Precautions: Knee Precaution Booklet Issued: No Restrictions Weight Bearing Restrictions: Yes RLE Weight Bearing: Weight bearing as tolerated      Mobility Bed Mobility Overal bed mobility:  Bed Mobility:      Supine to sit:      General bed mobility comments:   Transfers Overall transfer level: Needs assistance Equipment used: Rolling walker (2 wheeled) Transfers: Sit to/from Stand Sit to Stand: Min assist         General transfer comment:    Balance                                            ADL Overall ADL's : Independent (Previous)                                             Vision     Perception     Praxis      Pertinent Vitals/Pain Pain Assessment: 0-10 Pain Score: 7  Pain Location: right knee Pain Descriptors / Indicators: Aching Pain Intervention(s): RN gave pain meds during session     Hand Dominance     Extremity/Trunk Assessment Upper Extremity Assessment Upper Extremity Assessment: Overall WFL for tasks assessed   Lower Extremity Assessment Lower  Extremity Assessment: Generalized weakness (Able to perform SLR on R LE. grossly 5/5 L LE)       Communication Communication Communication: No difficulties   Cognition Arousal/Alertness: Awake/alert Behavior During Therapy: WFL for tasks assessed/performed Overall Cognitive Status: Within Functional Limits for tasks assessed                     General Comments       Exercises Exercises: Total Joint;Other exercises Other Exercises Other Exercises:    Shoulder Instructions      Home Living Family/patient expects to be discharged to:: Skilled nursing facility Living Arrangements: Alone                                      Prior Functioning/Environment Level of Independence: Independent             OT Diagnosis: Generalized weakness   OT Problem List:     OT Treatment/Interventions:      OT Goals(Current goals can be found in the care plan section) Acute   Rehab OT Goals Patient Stated Goal: to go to rehab  OT Frequency:     Barriers to D/C:            Co-evaluation              End of Session Equipment Utilized During Treatment:  (hip kit)  Activity Tolerance: Patient tolerated treatment well Patient left: in chair;with chair alarm set   Time: 1154-1217 OT Time Calculation (min): 23 min Charges:  OT General Charges $OT Visit: 1 Procedure OT Evaluation $Initial OT Evaluation Tier I: 1 Procedure OT Treatments $Self Care/Home Management : 8-22 mins G-Codes:    Huff, John M John M Huff, MS/OTR/L  07/19/2014, 1:28 PM    

## 2014-07-19 NOTE — Clinical Social Work Placement (Signed)
   CLINICAL SOCIAL WORK PLACEMENT  NOTE  Date:  07/19/2014  Patient Details  Name: [name] MRN: [id number] Date of Birth: [birth year]  Clinical Social Work is seeking post-discharge placement for this patient at the [place] level of care (*CSW will initial, date and re-position this form in  chart as items are completed):  Yes   Patient/family provided with [facility] Work Department's list of facilities offering this level of care within the geographic area requested by the patient (or if unable, by the patient's family).  Yes   Patient/family informed of their freedom to choose among providers that offer the needed level of care, that participate in Medicare, Medicaid or managed care program needed by the patient, have an available bed and are willing to accept the patient.  Yes   Patient/family informed of [facility]'s ownership interest in [facility] and [facility], as well as of the fact that they are under no obligation to receive care at these facilities.  PASRR submitted to EDS on 07/19/14     PASRR number received on 07/19/14     Existing PASRR number confirmed on       FL2 transmitted to all facilities in geographic area requested by pt/family on 07/19/14     FL2 transmitted to all facilities within larger geographic area on       Patient informed that his/her managed care company has contracts with or will negotiate with certain facilities, including the following:        Yes   Patient/family informed of bed offers received.  Patient chooses bed at  [facility] )     Physician recommends and patient chooses bed at      Patient to be transferred to   on  .  Patient to be transferred to facility by       Patient family notified on   of transfer.  Name of family member notified:        PHYSICIAN       Additional Comment:    _______________________________________________ Loralyn Freshwater,  LCSW 07/19/2014, 4:44 PM

## 2014-07-19 NOTE — Evaluation (Signed)
Physical Therapy Evaluation Patient Details Name: [name] MRN: [id number] DOB: Nov 29, 1945 Today's Date: 07/19/2014   History of Present Illness  Pt admitted for R TKR on 07/18/14.   Clinical Impression  Pt is a pleasant 69 year old female who was admitted for R TKR. Pt performs bed mobility, transfers, and ambulation with min assist and rw. Pt demonstrates deficits with pain/mobility/ROM. Would benefit from skilled PT to address above deficits and promote optimal return to PLOF. Pt demonstrates ability to perform 10 SLRs with independence, therefore does not require KI for mobility. Recommend transition to STR upon discharge from acute hospitalization.       Follow Up Recommendations SNF    Equipment Recommendations       Recommendations for Other Services       Precautions / Restrictions Precautions Precautions: Knee Precaution Booklet Issued: No Restrictions Weight Bearing Restrictions: Yes RLE Weight Bearing: Weight bearing as tolerated      Mobility  Bed Mobility Overal bed mobility: Needs Assistance Bed Mobility: Supine to Sit     Supine to sit: Min assist     General bed mobility comments: supine->sit with min assist with cues for sequencing and correct technique.   Transfers Overall transfer level: Needs assistance Equipment used: Rolling walker (2 wheeled) Transfers: Sit to/from Stand Sit to Stand: Min assist         General transfer comment: sit<>Stand with min assist and rw. Safe technique performed with cues for sequencing given. Pt able to WB through R LE.  Ambulation/Gait Ambulation/Gait assistance: Min assist Ambulation Distance (Feet): 40 Feet Assistive device: Rolling walker (2 wheeled) Gait Pattern/deviations: Step-to pattern     General Gait Details: ambulated using rw and min assist with mod cues for sequencing and safety of AD. Pt also cued for upright posture. Pt fatigues quickly unable to ambulate further distance.  Stairs            Wheelchair Mobility    Modified Rankin (Stroke Patients Only)       Balance                                             Pertinent Vitals/Pain Pain Assessment: 0-10 Pain Score: 7  Pain Location: R knee Pain Descriptors / Indicators: Aching Pain Intervention(s): Limited activity within patient's tolerance;Premedicated before session    Home Living Family/patient expects to be discharged to:: Skilled nursing facility Living Arrangements: Alone                    Prior Function Level of Independence: Independent               Hand Dominance        Extremity/Trunk Assessment   Upper Extremity Assessment: Overall WFL for tasks assessed           Lower Extremity Assessment: Generalized weakness (Able to perform SLR on R LE. grossly 5/5 L LE)         Communication   Communication: No difficulties  Cognition Arousal/Alertness: Awake/alert Behavior During Therapy: WFL for tasks assessed/performed Overall Cognitive Status: Within Functional Limits for tasks assessed                      General Comments      Exercises Total Joint Exercises Goniometric ROM: R knee AAROM 3-75 degrees, limited by pain Other Exercises  Other Exercises: Pt performed R knee ther-ex including 10 reps of ankle pumps, quad sets, SLRs, and hip abd/add. All ther-ex performed with min assist and cues for correct technique.      Assessment/Plan    PT Assessment Patient needs continued PT services  PT Diagnosis Difficulty walking;Abnormality of gait   PT Problem List Decreased strength;Decreased range of motion;Decreased activity tolerance;Pain;Decreased knowledge of use of DME;Decreased mobility  PT Treatment Interventions DME instruction;Gait training;Therapeutic exercise   PT Goals (Current goals can be found in the Care Plan section) Acute Rehab PT Goals Patient Stated Goal: to go to rehab PT Goal Formulation: With patient Time For  Goal Achievement: 08/02/14 Potential to Achieve Goals: Good    Frequency BID   Barriers to discharge        Co-evaluation               End of Session Equipment Utilized During Treatment: Gait belt Activity Tolerance: Patient tolerated treatment well Patient left: in chair;with chair alarm set Nurse Communication: Mobility status         Time: 8676-1950 PT Time Calculation (min) (ACUTE ONLY): 26 min   Charges:   PT Evaluation $Initial PT Evaluation Tier I: 1 Procedure PT Treatments $Therapeutic Exercise: 8-22 mins   PT G Codes:        Harmonii Karle 10-Aug-2014, 12:06 PM  Greggory Stallion, PT, DPT [phone]

## 2014-07-19 NOTE — Progress Notes (Signed)
Milk of mag administered 

## 2014-07-19 NOTE — Outcomes Assessment (Signed)
VSS, patient is A+O with no signs of distress. Pain is controlled with current medications. IV fluids with antibiotics infusing.  Foley d/c this am.  Polar care in place. hemovac patent.

## 2014-07-20 LAB — CBC
HCT: 36.2 % (ref 35.0–47.0)
Hemoglobin: 12 g/dL (ref 12.0–16.0)
MCH: 31.7 pg (ref 26.0–34.0)
MCHC: 33.2 g/dL (ref 32.0–36.0)
MCV: 95.6 fL (ref 80.0–100.0)
PLATELETS: 253 10*3/uL (ref 150–440)
RBC: 3.78 MIL/uL — ABNORMAL LOW (ref 3.80–5.20)
RDW: 13.5 % (ref 11.5–14.5)
WBC: 11.3 10*3/uL — ABNORMAL HIGH (ref 3.6–11.0)

## 2014-07-20 MED ORDER — LACTULOSE 10 GM/15ML PO SOLN
10.0000 g | Freq: Two times a day (BID) | ORAL | Status: DC | PRN
  Filled 2014-07-20: qty 30

## 2014-07-20 MED ORDER — OXYCODONE HCL 5 MG PO TABS: 5.0000 mg | ORAL_TABLET | ORAL | Status: DC | PRN

## 2014-07-20 MED ORDER — TRAMADOL HCL 50 MG PO TABS: 50.0000 mg | ORAL_TABLET | ORAL | Status: DC | PRN

## 2014-07-20 MED ORDER — ENOXAPARIN SODIUM 30 MG/0.3ML ~~LOC~~ SOLN: 30.0000 mg | Freq: Two times a day (BID) | SUBCUTANEOUS | Status: DC

## 2014-07-20 NOTE — Discharge Summary (Signed)
Physician Discharge Summary  Patient ID: [name] MRN: [id number] DOB/AGE: 02-18-1945 69 y.o.  Admit date: 07/18/2014 Discharge date: 07/20/2014  Admission Diagnoses:  [place]   Discharge Diagnoses: Patient Active Problem List   Diagnosis Date Noted  . Total knee replacement status 07/18/2014  . Pre-op evaluation 07/16/2014  . Health care maintenance 05/21/2014  . Neuropathy 03/18/2014  . Joint pain 01/28/2014  . Gastritis 10/03/2013  . History of colonic polyps 10/03/2013  . Leg length discrepancy 09/27/2013  . Right hip pain 09/12/2013  . Right knee pain 08/24/2013  . Osteoporosis 06/03/2013  . Anemia 06/03/2013  . SOB (shortness of breath) 05/06/2013  . Bleeding from the nose 12/24/2012  . Hyperparathyroidism, primary 08/03/2012  . Hypertension 02/01/2012  . Hypercholesterolemia 02/01/2012  . Depression 02/01/2012  . Anxiety 02/01/2012  . Chronic insomnia 02/01/2012    Past Medical History  Diagnosis Date  . Hypertension   . Anxiety   . Depression   . GERD (gastroesophageal reflux disease)   . Hypercholesterolemia   . Chronic insomnia   . COPD (chronic obstructive pulmonary disease)     documented in outside records  . Arthritis   . Heart murmur   . Osteoporosis   . Complication of anesthesia     "AFFECTS MY MEMORY"  . Shortness of breath     with exertion  . Tingling     both hands  . History of stomach ulcers   . Hyperparathyroidism, primary   . Burning feet syndrome   . Dysrhythmia     PALPITATIONS      Transfusion: Patient did receive Autovac transfusion the first 6 hours postoperatively.   Consultants (if any):   no consultations performed this admission  Discharged Condition: Improved  Hospital Course: [name] is an 69 y.o. female who was admitted 07/18/2014 with a diagnosis of degenerative arthrosis of right knee and went to the operating room on 07/18/2014 and underwent the above named procedures.     Surgeries:Procedure(s): TOTAL KNEE ARTHROPLASTY on 07/18/2014  ANESTHESIA: spinal  ESTIMATED BLOOD LOSS: 50 mL  FLUIDS REPLACED: 1900 mL of crystalloid  TOURNIQUET TIME: 90 minutes  DRAINS: 2 medium drains to a reinfusion system  SOFT TISSUE RELEASES: Anterior cruciate ligament, posterior cruciate ligament, deep medial collateral ligament, patellofemoral ligament   IMPLANTS UTILIZED: DePuy PFC Sigma size 3 posterior stabilized femoral component (cemented), size 2.5 MBT tibial component (cemented), 35 mm 3 peg oval dome patella (cemented), and a 12.5 mm stabilized rotating platform polyethylene insert.  Patient tolerated the surgery well. No complications .Patient was taken to PACU where she was stabilized and then transferred to the orthopedic floor.  Patient started on Lovenox 30 mg q 12 hrs. Foot pumps applied bilaterally at 80 mm Hg. Heels elevated off bed with rolled towels. No evidence of DVT. Calves non tender. Negative Homan. Physical therapy started on day #1 for gait training and transfer with OT starting on  day #1 for ADL and assisted devices. Patient has done well with therapy. Ambulated greater than 40  feet upon being discharged.  Patient's IV , foley was DC'd on day #1 and hemovac was d/c on day #2.   She was given perioperative antibiotics:  Anti-infectives    Start     Dose/Rate Route Frequency Ordered Stop   07/18/14 1715  ceFAZolin (ANCEF) IVPB 2 g/50 mL premix     2 g 100 mL/hr over 30 Minutes Intravenous Every 6 hours 07/18/14 1702 07/19/14 1242   07/18/14  0945  ceFAZolin (ANCEF) IVPB 2 g/50 mL premix     2 g 100 mL/hr over 30 Minutes Intravenous  Once 07/18/14 0936 07/18/14 1244   07/18/14 0940  ceFAZolin (ANCEF) 2-3 GM-% IVPB SOLR    Comments:  Newman, Cherrie: cabinet override      07/18/14 0940 07/18/14 2144    .  She was given sequential compression devices, early ambulation, and Lovenox and TED stockings for DVT prophylaxis.  She benefited  maximally from the hospital stay and there were no complications.    Recent vital signs:  Filed Vitals:   07/20/14 0354  BP: 136/73  Pulse: 80  Temp: 99.2 F (37.3 C)  Resp: 16    Recent laboratory studies:  Lab Results  Component Value Date   HGB 12.0 07/20/2014   HGB 11.8* 07/19/2014   HGB 13.5 07/05/2014   Lab Results  Component Value Date   WBC 11.3* 07/20/2014   PLT 253 07/20/2014   Lab Results  Component Value Date   INR 0.91 07/05/2014   Lab Results  Component Value Date   NA 140 07/19/2014   K 3.1* 07/19/2014   CL 104 07/19/2014   CO2 28 07/19/2014   BUN 18 07/19/2014   CREATININE 0.69 07/19/2014   GLUCOSE 123* 07/19/2014    Discharge Medications:     Medication List    STOP taking these medications        aspirin EC 81 MG tablet     meloxicam 15 MG tablet  Commonly known as:  MOBIC      TAKE these medications        acetaminophen 500 MG tablet  Commonly known as:  TYLENOL  Take 1,000 mg by mouth every 6 (six) hours as needed for fever.     amLODipine 5 MG tablet  Commonly known as:  NORVASC  Take 1 tablet (5 mg total) by mouth daily.     atorvastatin 40 MG tablet  Commonly known as:  LIPITOR  Take 1 tablet (40 mg total) by mouth once.     BIOTIN MAXIMUM STRENGTH 10 MG Tabs  Generic drug:  Biotin  Take 1 tablet by mouth daily.     chlorthalidone 25 MG tablet  Commonly known as:  HYGROTON  TAKE 1 TABLET (25 MG TOTAL) BY MOUTH DAILY.     cholecalciferol 1000 UNITS tablet  Commonly known as:  VITAMIN D  Take 1,000 Units by mouth daily.     docusate sodium 100 MG capsule  Commonly known as:  COLACE  Take 100 mg by mouth 2 (two) times daily as needed for mild constipation.     enoxaparin 30 MG/0.3ML injection  Commonly known as:  LOVENOX  Inject 0.3 mLs (30 mg total) into the skin every 12 (twelve) hours.  Start taking on:  07/21/2014     ferrous sulfate 325 (65 FE) MG tablet  Take 325 mg by mouth daily.     gabapentin 300 MG  capsule  Commonly known as:  NEURONTIN  Take 600 mg by mouth at bedtime.     magnesium oxide 400 MG tablet  Commonly known as:  MAG-OX  Take 400 mg by mouth daily.     MULTIVITAMIN PO  Take 1 tablet by mouth daily.     OMEGA 3 PO  Take 1 capsule by mouth daily.     oxyCODONE 5 MG immediate release tablet  Commonly known as:  Oxy IR/ROXICODONE  Take 1-2 tablets (5-10 mg total) by mouth  every 3 (three) hours as needed for breakthrough pain.     pantoprazole 40 MG tablet  Commonly known as:  PROTONIX  Take 1 tablet (40 mg total) by mouth daily.     potassium chloride 10 MEQ tablet  Commonly known as:  KLOR-CON M10  Take 1 tablet (10 mEq total) by mouth daily.     REFRESH TEARS OP  Apply 1 drop to eye as needed (for dry eyes).     traMADol 50 MG tablet  Commonly known as:  ULTRAM  Take 1-2 tablets (50-100 mg total) by mouth every 4 (four) hours as needed for moderate pain.     valsartan 320 MG tablet  Commonly known as:  DIOVAN  TAKE 1 TABLET (320 MG TOTAL) BY MOUTH DAILY.        Diagnostic Studies: Dexascan  07/17/2014   EXAM: DUAL X-RAY ABSORPTIOMETRY (DXA) FOR BONE MINERAL DENSITY  IMPRESSION: Dear Dr. Nicki Reaper,  Your patient [name] completed a BMD test on 07/17/2014 using the [place] (analysis version: 14.10) manufactured by [employer]. The following summarizes the results of our evaluation. PATIENT BIOGRAPHICAL: Name: Ilani, Otterson Patient ID: [id number] Birth Date: Nov 03, 1945 Height: 66.0 in. Gender: Female Exam Date: 07/17/2014 Weight: 167.3 lbs. Indications: Caucasian, Family Hx of Osteoporosis, Height Loss, Hip Replacement, Postmenopausal, right hip replacement, Scoliosis, Family Hist. (Parent hip fracture) Fractures: Treatments: ASPRIN 81 MG, gabapentin, lipitor, Vitamin D  ASSESSMENT: The BMD measured at Femur Total is 0.734 g/cm2 with a T-score of -2.2. This patient is considered osteopenic according to [facility])  criteria. L-3&4 was excluded due to degenerative changes. Right Femur was excluded due to surgical hardware. Site Region Measured Measured WHO Young Adult BMD Date       Age      Classification T-score AP Spine L1-L2 [age] 67.3 Normal -0.6 1.106 g/cm2  Left Femur Total 07/17/2014 67.3 Osteopenia -2.2 0.734 g/cm2  Left Forearm Radius 33% 07/17/2014 67.3 Normal -0.6 0.820 g/cm2  World Health Organization [facility]) criteria for post-menopausal, Caucasian Women: Normal:       T-score at or above -1 SD Osteopenia:   T-score between -1 and -2.5 SD Osteoporosis: T-score at or below -2.5 SD  RECOMMENDATIONS: [place] recommends that FDA-approved medical therapies be considered in postmenopausal women and men age 49 or older with a: 1. Hip or vertebral (clinical or morphometric) fracture. 2. T-score of < -2.5 at the spine or hip. 3. Ten-year fracture probability by FRAX of 3% or greater for hip fracture or 20% or greater for major osteoporotic fracture. All treatment decisions require clinical judgment and consideration of individual patient factors, including patient preferences, co-morbidities, previous drug use, risk factors not captured in the FRAX model (e.g. falls, vitamin D deficiency, increased bone turnover, interval significant decline in bone density) and possible under - or over-estimation of fracture risk by FRAX.  All patients should ensure an adequate intake of dietary calcium (1200 mg/d) and vitamin D (800 IU daily) unless contraindicated. FOLLOW-UP: People with diagnosed cases of osteoporosis or at high risk for fracture should have regular bone mineral density tests. For patients eligible for Medicare, routine testing is allowed once every 2 years. The testing frequency can be increased to one year for patients who have rapidly progressing disease, those who are receiving or discontinuing medical therapy to restore bone mass, or have additional risk factors. I have reviewed this  report, and agree with the above findings.  [facility] Dear Dr. Nicki Reaper,  Your patient [name] completed a FRAX assessment on 07/17/2014 using the [place] (analysis version: 14.10) manufactured by [employer]. The following summarizes the results of our evaluation.  PATIENT BIOGRAPHICAL: Name: [name], [name] Patient ID: [id number] Birth Date: 1945-10-28 Height:    66.0 in. Gender:     Female    [age]       Weight:    167.3 lbs. Ethnicity:  White                            Exam Date: 07/17/2014  FRAX* RESULTS:  (version: 3.5) 10-year Probability of Fracture1 Major Osteoporotic Fracture2 Hip Fracture 18.7% 2.5% Population: [place] (Caucasian) Risk Factors: Family Hist. (Parent hip fracture)  Based on Femur (Left) Neck BMD  1 -The 10-year probability of fracture may be lower than reported if the patient has received treatment. 2 -Major Osteoporotic Fracture: Clinical Spine, Forearm, Hip or Shoulder  *FRAX is a Materials engineer of the [employer] for Metabolic Bone Disease, a [place] (WHO) [facility].  ASSESSMENT: The probability of a major osteoporotic fracture is 18.7% within the next ten years.  The probability of a hip fracture is 2.5% within the next ten years.  [facility]   Electronically Signed   By: David  Martinique M.D.   On: 07/17/2014 11:14   Dg Knee Right Port  07/18/2014   CLINICAL DATA:  Postop total knee replacement.  EXAM: PORTABLE RIGHT KNEE - 1-2 VIEW  COMPARISON:  09/27/2013  FINDINGS: Changes of right knee replacement. No hardware or bony complicating feature. Soft tissue drains in place.  IMPRESSION: Right knee replacement.  No complicating feature.   Electronically Signed   By: Rolm Baptise M.D.   On: 07/18/2014 16:25    Disposition: 01-Home or Self Care      Discharge Instructions    Diet - low sodium heart healthy    Complete by:  As directed      Increase activity slowly     Complete by:  As directed            Follow-up Information    Follow up with Feliberto Gottron, PA-C On 08/02/2014.   Specialties:  Orthopedic Surgery, Emergency Medicine   Why:  Appointment is at 10:15   Contact information:   [place] [address] [phone]       Follow up with Dereck Leep, MD On 08/30/2014.   Specialty:  Orthopedic Surgery   Why:  Appointment is at 9:45   Contact information:   [place] [address] [phone]       Follow up with [facility] R., PA.   Specialty:  Physician Assistant   Why:  Patient should already have an appointment scheduled for 2 weeks; call for date and time   Contact information:   [place] [facility] [place] [address] [phone]        Signed: Watt Climes 07/20/2014, 7:37 AM

## 2014-07-20 NOTE — Discharge Instructions (Signed)
° °TOTAL KNEE REPLACEMENT POSTOPERATIVE DIRECTIONS ° °Knee Rehabilitation, Guidelines Following Surgery  °Results after knee surgery are often greatly improved when you follow the exercise, range of motion and muscle strengthening exercises prescribed by your doctor. Safety measures are also important to protect the knee from further injury. Any time any of these exercises cause you to have increased pain or swelling in your knee joint, decrease the amount until you are comfortable again and slowly increase them. If you have problems or questions, call your caregiver or physical therapist for advice.  ° °HOME CARE INSTRUCTIONS  °Remove items at home which could result in a fall. This includes throw rugs or furniture in walking pathways.  °· Continue to use polar care unit on the knee for pain and swelling from surgery. You may notice swelling that will progress down to the foot and ankle.  This is normal after surgery.  Elevate the leg when you are not up walking on it.   °· Continue to use the breathing machine which will help keep your temperature down.  It is common for your temperature to cycle up and down following surgery, especially at night when you are not up moving around and exerting yourself.  The breathing machine keeps your lungs expanded and your temperature down. °· Do not place pillow under knee, focus on keeping the knee STRAIGHT while resting ° °DIET °You may resume your previous home diet once your are discharged from the hospital. ° °DRESSING / WOUND CARE / SHOWERING °You may start showering once staples have been removed. Change the surgical dressing as needed. ° °ACTIVITY °Walk with your walker as instructed. °Use walker as long as suggested by your caregivers. °Avoid periods of inactivity such as sitting longer than an hour when not asleep. This helps prevent blood clots.  °You may resume a sexual relationship in one month or when given the OK by your doctor.  °You may return to work once  you are cleared by your doctor.  °Do not drive a car for 6 weeks or until released by you surgeon.  °Do not drive while taking narcotics. ° °WEIGHT BEARING °Weight bearing as tolerated with assist device (walker, cane, etc) as directed, use it as long as suggested by your surgeon or therapist, typically at least 4-6 weeks. ° °POSTOPERATIVE CONSTIPATION PROTOCOL °Constipation - defined medically as fewer than three stools per week and severe constipation as less than one stool per week. ° °One of the most common issues patients have following surgery is constipation.  Even if you have a regular bowel pattern at home, your normal regimen is likely to be disrupted due to multiple reasons following surgery.  Combination of anesthesia, postoperative narcotics, change in appetite and fluid intake all can affect your bowels.  In order to avoid complications following surgery, here are some recommendations in order to help you during your recovery period. ° °Colace (docusate) - Pick up an over-the-counter form of Colace or another stool softener and take twice a day as long as you are requiring postoperative pain medications.  Take with a full glass of water daily.  If you experience loose stools or diarrhea, hold the colace until you stool forms back up.  If your symptoms do not get better within 1 week or if they get worse, check with your doctor. ° °Dulcolax (bisacodyl) - Pick up over-the-counter and take as directed by the product packaging as needed to assist with the movement of your bowels.  Take with a   full glass of water.  Use this product as needed if not relieved by Colace only.   MiraLax (polyethylene glycol) - Pick up over-the-counter to have on hand.  MiraLax is a solution that will increase the amount of water in your bowels to assist with bowel movements.  Take as directed and can mix with a glass of water, juice, soda, coffee, or tea.  Take if you go more than two days without a movement. Do not use  MiraLax more than once per day. Call your doctor if you are still constipated or irregular after using this medication for 7 days in a row.  If you continue to have problems with postoperative constipation, please contact the office for further assistance and recommendations.  If you experience "the worst abdominal pain ever" or develop nausea or vomiting, please contact the office immediatly for further recommendations for treatment.  ITCHING  If you experience itching with your medications, try taking only a single pain pill, or even half a pain pill at a time.  You can also use Benadryl over the counter for itching or also to help with sleep.   TED HOSE STOCKINGS Wear the elastic stockings on both legs for  6 weeks following surgery during the day but you may remove then at night for sleeping.  MEDICATIONS See your medication summary on the After Visit Summary that the nursing staff will review with you prior to discharge.  You may have some home medications which will be placed on hold until you complete the course of blood thinner medication.  It is important for you to complete the blood thinner medication as prescribed by your surgeon.  Continue your approved medications as instructed at time of discharge.  PRECAUTIONS If you experience chest pain or shortness of breath - call 911 immediately for transfer to the hospital emergency department.  If you develop a fever greater that 101 F, purulent drainage from wound, increased redness or drainage from wound, foul odor from the wound/dressing, or calf pain - CONTACT YOUR SURGEON.                                                   FOLLOW-UP APPOINTMENTS Make sure you keep all of your appointments after your operation with your surgeon and caregivers. You should call the office at the above phone number and make an appointment for approximately two weeks after the date of your surgery or on the date instructed by your surgeon outlined in the  "After Visit Summary".   RANGE OF MOTION AND STRENGTHENING EXERCISES  Rehabilitation of the knee is important following a knee injury or an operation. After just a few days of immobilization, the muscles of the thigh which control the knee become weakened and shrink (atrophy). Knee exercises are designed to build up the tone and strength of the thigh muscles and to improve knee motion. Often times heat used for twenty to thirty minutes before working out will loosen up your tissues and help with improving the range of motion but do not use heat for the first two weeks following surgery. These exercises can be done on a training (exercise) mat, on the floor, on a table or on a bed. Use what ever works the best and is most comfortable for you Knee exercises include:  Leg Lifts - While your knee is  still immobilized in a splint or cast, you can do straight leg raises. Lift the leg to 60 degrees, hold for 3 sec, and slowly lower the leg. Repeat 10-20 times 2-3 times daily. Perform this exercise against resistance later as your knee gets better.  Quad and Hamstring Sets - Tighten up the muscle on the front of the thigh (Quad) and hold for 5-10 sec. Repeat this 10-20 times hourly. Hamstring sets are done by pushing the foot backward against an object and holding for 5-10 sec. Repeat as with quad sets.   Leg Slides: Lying on your back, slowly slide your foot toward your buttocks, bending your knee up off the floor (only go as far as is comfortable). Then slowly slide your foot back down until your leg is flat on the floor again.  Angel Wings: Lying on your back spread your legs to the side as far apart as you can without causing discomfort.  A rehabilitation program following serious knee injuries can speed recovery and prevent re-injury in the future due to weakened muscles. Contact your doctor or a physical therapist for more information on knee rehabilitation.   IF YOU ARE TRANSFERRED TO A SKILLED REHAB  FACILITY If the patient is transferred to a skilled rehab facility following release from the hospital, a list of the current medications will be sent to the facility for the patient to continue.  When discharged from the skilled rehab facility, please have the facility set up the patient's [place] prior to being released. Also, the skilled facility will be responsible for providing the patient with their medications at time of release from the facility to include their pain medication, the muscle relaxants, and their blood thinner medication. If the patient is still at the rehab facility at time of the two week follow up appointment, the skilled rehab facility will also need to assist the patient in arranging follow up appointment in our office and any transportation needs.  MAKE SURE YOU:  Understand these instructions.  Get help right away if you are not doing well or get worse.

## 2014-07-20 NOTE — Progress Notes (Signed)
VSS, patient is A+O with no signs of distress. Pain controlled this shift.  Up to BR voiding without difficulty.  Milk of Mag administered this am. IV fluids infusing. Appears to have slept well.

## 2014-07-20 NOTE — Progress Notes (Signed)
Occupational Therapy Treatment Patient Details Name: [name] MRN: [id number] DOB: November 26, 1945 Today's Date: 07/20/2014    History of present illness This patient is a 69 year old female who came to [facility] for a right TKR.   OT comments  Pt is progressing well with self care skills using reacher and sock aid while sitting up in chair.  She presents with a delay in processing speed when following directions and needs extra time and only 1-2 step directions to be successful with retaining information.  Minimal assist and mod cues for LB dressing skills using reacher and sock aid.  Discussed use of elastic shoe laces at [facility] since pt is to continue rehab there tomorrow.  Pain level was 7/10 throughout session and last pain meds at 10:30am per patient and NSG.    Follow Up Recommendations       Equipment Recommendations   (sock aid and elastic shoe laces)    Recommendations for Other Services      Precautions / Restrictions Precautions Precautions: Knee Precaution Booklet Issued: Yes (comment) Restrictions Weight Bearing Restrictions: Yes RLE Weight Bearing: Weight bearing as tolerated       Mobility Bed Mobility                  Transfers Overall transfer level: Needs assistance Equipment used: Rolling walker (2 wheeled) Transfers: Sit to/from Stand Sit to Stand: Min assist         General transfer comment: Sit -> stand remains min assist for physical supports and pt requires cueing for safety. Tends to be impulsive with     Balance                                   ADL Overall ADL's : Needs assistance/impaired                     Lower Body Dressing:  (discussed use of elastic shoe laces and reviewed adaptive equipment catalog )                 General ADL Comments: Pt making good progress with LB dressing skills using assisitve devices but requires extra time for processsing and sequencing and presents with decreased  safety awareness and problem solving which will need to be addressed at [facility] since she livea at home alone      Vision                     Perception     Praxis      Cognition   Behavior During Therapy: [facility] for tasks assessed/performed Overall Cognitive Status: Within Functional Limits for tasks assessed                       Extremity/Trunk Assessment               Exercises Total Joint Exercises Goniometric ROM: R knee AAROM 4-76 Other Exercises Other Exercises: Pt performed R knee ther-ex including 12 reps of ankle pumps,  SAQ, quad sets, SLRs, and hip abd/add. All ther-ex performed with min assist and cues for correct technique.LAQ added.    Shoulder Instructions       General Comments      Pertinent Vitals/ Pain         Home Living  Prior Functioning/Environment              Frequency Min 1X/week     Progress Toward Goals  OT Goals(current goals can now be found in the care plan section)  Progress towards OT goals: Progressing toward goals  Acute Rehab OT Goals Patient Stated Goal: to get better with taking care of myself  Plan Discharge plan remains appropriate    Co-evaluation                 End of Session     Activity Tolerance Patient tolerated treatment well   Patient Left in chair;with call bell/phone within reach;with chair alarm set   Nurse Communication          Time: 5498-2641 OT Time Calculation (min): 26 min  Charges: OT General Charges $OT Visit: 1 Procedure OT Treatments $Self Care/Home Management : 23-37 mins  Shaniquia Brafford 07/20/2014, 11:43 AM   Chrys Racer, OTR/L ascom [phone]

## 2014-07-20 NOTE — Progress Notes (Signed)
Physical Therapy Treatment Patient Details Name: [name] MRN: [id number] DOB: 01-22-1946 Today's Date: 07/20/2014    History of Present Illness This patient is a 69 year old female who came to [facility] for a right TKR.    PT Comments    Pt continues to progress towards goals. Ambulation is performed with CGA for safety with verbal cueing for sequencing of RW with gait. Transfers are performed at [facility] but pt still requires assistance with positioning and sequencing of task in order to safely perform it. Pt requires skilled physical therapy for safety in ambulation and transfers.   Follow Up Recommendations  SNF     Equipment Recommendations       Recommendations for Other Services       Precautions / Restrictions Precautions Precautions: Knee Precaution Booklet Issued: Yes (comment) Restrictions Weight Bearing Restrictions: Yes RLE Weight Bearing: Weight bearing as tolerated    Mobility  Bed Mobility               General bed mobility comments: All therapy tasks were performed in recliner and out in hallway. No bed mobility was performed/assessed during this session.   Transfers Overall transfer level: Needs assistance Equipment used: Rolling walker (2 wheeled) Transfers: Sit to/from Stand Sit to Stand: Min guard         General transfer comment: sit -> stand performed at min guard, however pt required cueing for preliminary positioning and sequencing of movements in order to execute activity safely.  Ambulation/Gait Ambulation/Gait assistance: Min guard Ambulation Distance (Feet): 80 Feet Assistive device: Rolling walker (2 wheeled) Gait Pattern/deviations: Step-through pattern     General Gait Details: Pt ambulates using RW and CGA for safety, especially with turns (tends to turn quickly). Requires verbal cueing for sequencing of gait with walker. Pt will follow instructions but remains impulsive with tasks, as with transfers.    Stairs            Wheelchair Mobility    Modified Rankin (Stroke Patients Only)       Balance                                    Cognition Arousal/Alertness: Awake/alert Behavior During Therapy: WFL for tasks assessed/performed Overall Cognitive Status: Within Functional Limits for tasks assessed                      Exercises Total Joint Exercises Goniometric ROM: R knee AAROM 4-76 Other Exercises Other Exercises: Pt performed R knee ther-ex including 12 reps of ankle pumps,  SAQ, quad sets, SLRs, and hip abd/add. All ther-ex performed with min assist and cues for correct technique.LAQ added.     General Comments        Pertinent Vitals/Pain Pain Assessment: No/denies pain    Home Living                      Prior Function            PT Goals (current goals can now be found in the care plan section) Acute Rehab PT Goals Patient Stated Goal: To finish lunch and walk PT Goal Formulation: With patient Time For Goal Achievement: 08/02/14 Potential to Achieve Goals: Good Progress towards PT goals: Progressing toward goals    Frequency  BID    PT Plan Current plan remains appropriate    Co-evaluation  End of Session Equipment Utilized During Treatment: Gait belt Activity Tolerance: Patient tolerated treatment well Patient left: in chair;with chair alarm set;with SCD's reapplied     Time: 6681-5947 PT Time Calculation (min) (ACUTE ONLY): 26 min  Charges:  $Gait Training: 8-22 mins $Therapeutic Exercise: 8-22 mins                    G Codes:      Janyth Contes 2014-07-27, 2:10 PM

## 2014-07-20 NOTE — Progress Notes (Signed)
   Subjective: 2 Days Post-Op Procedure(s) (LRB): TOTAL KNEE ARTHROPLASTY (Right) Patient reports pain as 6 on 0-10 scale.   Patient is well, and has had no acute complaints or problems Continue physical therapy today.  Plan is to go Rehab after hospital stay. no nausea and no vomiting. Some abdominal discomfort. No bowel movement as of yet. Patient denies any chest pains or shortness of breath. Objective: Vital signs in last 24 hours: Temp:  [97.9 F (36.6 C)-99.2 F (37.3 C)] 99.2 F (37.3 C) (06/10 0354) Pulse Rate:  [63-80] 80 (06/10 0354) Resp:  [16-18] 16 (06/10 0354) BP: (124-136)/(62-73) 136/73 mmHg (06/10 0354) SpO2:  [91 %-95 %] 93 % (06/10 0354) well approximated incision Heels are non tender and elevated off the bed using rolled towels Intake/Output from previous day: 06/09 0701 - 06/10 0700 In: 2568.3 [P.O.:720; I.V.:1848.3] Out: 220 [Drains:110] Intake/Output this shift:     Recent Labs  07/19/14 0530 07/20/14 0457  HGB 11.8* 12.0    Recent Labs  07/19/14 0530 07/20/14 0457  WBC 9.5 11.3*  RBC 3.68* 3.78*  HCT 35.4 36.2  PLT 249 253    Recent Labs  07/19/14 0530  NA 140  K 3.1*  CL 104  CO2 28  BUN 18  CREATININE 0.69  GLUCOSE 123*  CALCIUM 8.6*   No results for input(s): LABPT, INR in the last 72 hours.  EXAM General - Patient is Alert, Appropriate and Oriented Extremity - Neurologically intact Neurovascular intact Sensation intact distally Intact pulses distally Dorsiflexion/Plantar flexion intact Dressing - scant drainage Motor Function - intact, moving foot and toes well on exam. Good gross motor strength against resistance of dorsiflexion and plantarflexion of the ankle  Past Medical History  Diagnosis Date  . Hypertension   . Anxiety   . Depression   . GERD (gastroesophageal reflux disease)   . Hypercholesterolemia   . Chronic insomnia   . COPD (chronic obstructive pulmonary disease)     documented in outside records   . Arthritis   . Heart murmur   . Osteoporosis   . Complication of anesthesia     "AFFECTS MY MEMORY"  . Shortness of breath     with exertion  . Tingling     both hands  . History of stomach ulcers   . Hyperparathyroidism, primary   . Burning feet syndrome   . Dysrhythmia     PALPITATIONS     Assessment/Plan: 2 Days Post-Op Procedure(s) (LRB): TOTAL KNEE ARTHROPLASTY (Right) Active Problems:   Total knee replacement status  Estimated body mass index is 26.94 kg/(m^2) as calculated from the following:   Height as of this encounter: 5\' 6"  (1.676 m).   Weight as of this encounter: 75.66 kg (166 lb 12.8 oz). Up with therapy D/C IV fluids Plan for discharge tomorrow Discharge to SNF  Labs: Were reviewed on today's visit DVT Prophylaxis - Lovenox, Xarelto and TED hose Weight-Bearing as tolerated to right leg Needs to have a bowel movement today. Patient states that she does take suppositories at home Hemovac removed today. Moderate bleeding around the drain site. No drainage once the Hemovac was removed  [name] 07/20/2014, 7:21 AM

## 2014-07-20 NOTE — Progress Notes (Signed)
Physical Therapy Treatment Patient Details Name: [name] MRN: [id number] DOB: [birth year] Today's Date: 07/20/2014    History of Present Illness This patient is a 69 year old female who came to [facility] for a right TKR.    PT Comments    Pt making progress towards physical therapy goals. Ambulation is improving in terms of distance/endurance at [facility], however pt still requires verbal cueing for both sequencing and safety practices with ambulation. Transfers remain min assist for physical support and also require cueing for safety practices. Pt tends to be impulsive with tasks, and requires close attention during therapy.    Follow Up Recommendations  SNF     Equipment Recommendations       Recommendations for Other Services       Precautions / Restrictions Precautions Precautions: Knee Precaution Booklet Issued: Yes (comment) Restrictions Weight Bearing Restrictions: Yes RLE Weight Bearing: Weight bearing as tolerated    Mobility  Bed Mobility                  Transfers Overall transfer level: Needs assistance Equipment used: Rolling walker (2 wheeled) Transfers: Sit to/from Stand Sit to Stand: Min assist         General transfer comment: Sit -> stand remains min assist for physical supports and pt requires cueing for safety. Tends to be impulsive with   Ambulation/Gait Ambulation/Gait assistance: Min guard Ambulation Distance (Feet): 60 Feet Assistive device: Rolling walker (2 wheeled) Gait Pattern/deviations: Step-through pattern     General Gait Details: Pt ambulates using RW and CGA for safety, especially with turns (tends to turn quickly). Pt will follow instructions but remains impulsive with tasks, as with transfers.    Stairs            Wheelchair Mobility    Modified Rankin (Stroke Patients Only)       Balance                                    Cognition Arousal/Alertness: Awake/alert Behavior During Therapy:  WFL for tasks assessed/performed Overall Cognitive Status: Within Functional Limits for tasks assessed                      Exercises Total Joint Exercises Goniometric ROM: R knee AAROM 4-76 Other Exercises Other Exercises: Pt performed R knee ther-ex including 12 reps of ankle pumps,  SAQ, quad sets, SLRs, and hip abd/add. All ther-ex performed with min assist and cues for correct technique.LAQ added.     General Comments        Pertinent Vitals/Pain Pain Assessment: No/denies pain    Home Living                      Prior Function            PT Goals (current goals can now be found in the care plan section) Acute Rehab PT Goals Patient Stated Goal: To participate in therapy  PT Goal Formulation: With patient Time For Goal Achievement: 08/02/14 Potential to Achieve Goals: Good Progress towards PT goals: Progressing toward goals    Frequency  BID    PT Plan Current plan remains appropriate    Co-evaluation             End of Session Equipment Utilized During Treatment: Gait belt Activity Tolerance: Patient tolerated treatment well Patient left: in chair;with chair alarm set;with  SCD's reapplied     Time: 4270-6237 PT Time Calculation (min) (ACUTE ONLY): 28 min  Charges:                       G CodesJanyth Contes 2014/08/08, 11:38 AM

## 2014-07-20 NOTE — Progress Notes (Signed)
[facility] Medicare authorization has been received. Auth # [id number] RUA, approved for 3 days starting 07/21/14. Clinical Education officer, museum (CSW) sent D/C Summary and signed Consent Form to [employer] [facility] [place] today. Plan is for patient to D/C to [facility] tomorrow 07/21/14 to room 205. RN will call report at [phone]. Patient's son Gwynneth Macleod and daughter in law Maudie Mercury are planning on transporting patient to [facility] Saturday. CSW will continue to follow and assist as needed.   Blima Rich, [place] [phone]

## 2014-07-21 LAB — CBC
HEMATOCRIT: 33.8 % — AB (ref 35.0–47.0)
HEMOGLOBIN: 11.7 g/dL — AB (ref 12.0–16.0)
MCH: 33.2 pg (ref 26.0–34.0)
MCHC: 34.6 g/dL (ref 32.0–36.0)
MCV: 95.9 fL (ref 80.0–100.0)
Platelets: 236 10*3/uL (ref 150–440)
RBC: 3.53 MIL/uL — AB (ref 3.80–5.20)
RDW: 13.5 % (ref 11.5–14.5)
WBC: 11 10*3/uL (ref 3.6–11.0)

## 2014-07-21 NOTE — Progress Notes (Signed)
   Subjective: 3 Days Post-Op Procedure(s) (LRB): TOTAL KNEE ARTHROPLASTY (Right) Patient reports pain as mild Patient is well, and has had no acute complaints or problems Continue physical therapy today.  Plan is to go Rehab today  Objective: Vital signs in last 24 hours: Temp:  [97.9 F (36.6 C)-99.2 F (37.3 C)] 98.3 F (36.8 C) (06/11 0732) Pulse Rate:  [63-78] 63 (06/11 0732) Resp:  [16-18] 18 (06/11 0732) BP: (119-136)/(67-86) 134/76 mmHg (06/11 0732) SpO2:  [94 %-97 %] 97 % (06/11 0732) well approximated incision Heels are non tender and elevated off the bed using rolled towels Intake/Output from previous day: 06/10 0701 - 06/11 0700 In: 585 [P.O.:360; I.V.:225] Out: -  Intake/Output this shift:     Recent Labs  07/19/14 0530 07/20/14 0457 07/21/14 0347  HGB 11.8* 12.0 11.7*    Recent Labs  07/20/14 0457 07/21/14 0347  WBC 11.3* 11.0  RBC 3.78* 3.53*  HCT 36.2 33.8*  PLT 253 236    Recent Labs  07/19/14 0530  NA 140  K 3.1*  CL 104  CO2 28  BUN 18  CREATININE 0.69  GLUCOSE 123*  CALCIUM 8.6*   No results for input(s): LABPT, INR in the last 72 hours.  EXAM General - Patient is Alert, Appropriate and Oriented Extremity - Neurologically intact Neurovascular intact Sensation intact distally Intact pulses distally Dorsiflexion/Plantar flexion intact Dressing - scant drainage Motor Function - intact, moving foot and toes well on exam. Good gross motor strength against resistance of dorsiflexion and plantarflexion of the ankle  Past Medical History  Diagnosis Date  . Hypertension   . Anxiety   . Depression   . GERD (gastroesophageal reflux disease)   . Hypercholesterolemia   . Chronic insomnia   . COPD (chronic obstructive pulmonary disease)     documented in outside records  . Arthritis   . Heart murmur   . Osteoporosis   . Complication of anesthesia     "AFFECTS MY MEMORY"  . Shortness of breath     with exertion  . Tingling      both hands  . History of stomach ulcers   . Hyperparathyroidism, primary   . Burning feet syndrome   . Dysrhythmia     PALPITATIONS     Assessment/Plan: 3 Days Post-Op Procedure(s) (LRB): TOTAL KNEE ARTHROPLASTY (Right) Active Problems:   Total knee replacement status  Estimated body mass index is 26.94 kg/(m^2) as calculated from the following:   Height as of this encounter: 5\' 6"  (1.676 m).   Weight as of this encounter: 75.66 kg (166 lb 12.8 oz). Discharge to SNF today Follow up with [place] ortho in 2 weeks  Labs: Were reviewed on today's visit DVT Prophylaxis - Lovenox, Xarelto and TED hose Weight-Bearing as tolerated to right leg   Duanne Guess PA-C [place] 07/21/2014, 8:16 AM

## 2014-07-21 NOTE — Progress Notes (Signed)
Physical Therapy Treatment Patient Details Name: [name] MRN: [id number] DOB: Feb 06, 1946 Today's Date: 07/21/2014    History of Present Illness This patient is a 69 year old female who came to [facility] for a right TKR.    PT Comments    Pt was able to climb stairs today and uses excellent awareness of RLE with mobility.  Pt is leaving with friend transport to SNF and will be very appropriate for this setting given her motivation and attitude for hard work.  Will see this PM if pt has not discharged from services.  Follow Up Recommendations  SNF     Equipment Recommendations  None recommended by PT    Recommendations for Other Services       Precautions / Restrictions Precautions Precautions: Knee Restrictions Weight Bearing Restrictions: Yes RLE Weight Bearing: Weight bearing as tolerated    Mobility  Bed Mobility   Bed Mobility: Supine to Sit;Sit to Supine     Supine to sit: Min guard Sit to supine: Supervision      Transfers Overall transfer level: Modified independent Equipment used: Rolling walker (2 wheeled) Transfers: Sit to/from [facility] Sit to Stand: Min guard Stand pivot transfers: Min guard          Ambulation/Gait Ambulation/Gait assistance: Min guard Ambulation Distance (Feet): 300 Feet Assistive device: Rolling walker (2 wheeled) Gait Pattern/deviations: Step-through pattern;Decreased stride length;Decreased dorsiflexion - right;Decreased weight shift to right;Wide base of support;Trunk flexed Gait velocity: reduced Gait velocity interpretation: Below normal speed for age/gender     Stairs Stairs: Yes Stairs assistance: Min guard Stair Management: Two rails;Step to pattern;Forwards Number of Stairs: 8 General stair comments: pt verbally reminded herself of the sequence  Wheelchair Mobility    Modified Rankin (Stroke Patients Only)       Balance                                     Cognition Arousal/Alertness: Awake/alert Behavior During Therapy: WFL for tasks assessed/performed Overall Cognitive Status: Within Functional Limits for tasks assessed                      Exercises Total Joint Exercises Ankle Circles/Pumps: AROM;Both;5 reps Quad Sets: AROM;Both;10 reps Gluteal Sets: AROM;Both;10 reps Heel Slides: AROM;Both;20 reps Straight Leg Raises: AROM;AAROM;Both;10 reps    General Comments General comments (skin integrity, edema, etc.): recovering with mobility and was able to pull RLE onto bed alone for the first time in returning to bed      Pertinent Vitals/Pain Pain Assessment: 0-10 Pain Score: 3  Pain Location: R knee Pain Intervention(s): Limited activity within patient's tolerance;Premedicated before session;Ice applied    Home Living                      Prior Function            PT Goals (current goals can now be found in the care plan section) Acute Rehab PT Goals Patient Stated Goal: to get home Progress towards PT goals: Progressing toward goals    Frequency  BID    PT Plan Current plan remains appropriate    Co-evaluation             End of Session Equipment Utilized During Treatment: Gait belt Activity Tolerance: Patient tolerated treatment well Patient left: in bed;with call bell/phone within reach;with nursing/sitter in room  Time: 5868-2574 PT Time Calculation (min) (ACUTE ONLY): 26 min  Charges:  $Gait Training: 8-22 mins $Therapeutic Exercise: 8-22 mins                    G Codes:      [name] Aug 09, 2014, 11:09 AM  [name], PT MS Acute Rehab Dept. Number: [facility] [id number] and [place] [phone]

## 2014-07-21 NOTE — Progress Notes (Signed)
Pain controlled with po meds, ambulated by physical therapy, continent bowel and bladder, honeycomb dressing changed to rt knee , pt for discharge to [facility] report called to [place] . Pt to be transported by family

## 2014-07-21 NOTE — Progress Notes (Signed)
Pt for d/c to [facility] today via family transport. Pt's son and dtr-in-law at bedside and agreeable to d/c. D/C summary sent Friday. Packet complete and RN to call report. CSW signing off. Wandra Feinstein, MSW, LCSW [phone] (weekend coverage)

## 2014-07-21 NOTE — Progress Notes (Signed)
Per pt request given oxycodone at 11:30 prior to discharge , dressing changed to rt knee prior to discharge  pt transported via car by family to [facility]

## 2014-07-22 IMAGING — CR DG CHEST 2V
2 series · 2 of 2 positions shown · non-contrast
Comparison: 01/01/2010

CLINICAL DATA: Preop cervical spine surgery

CHEST - 2 VIEW

[view not recorded (1 of 2)]
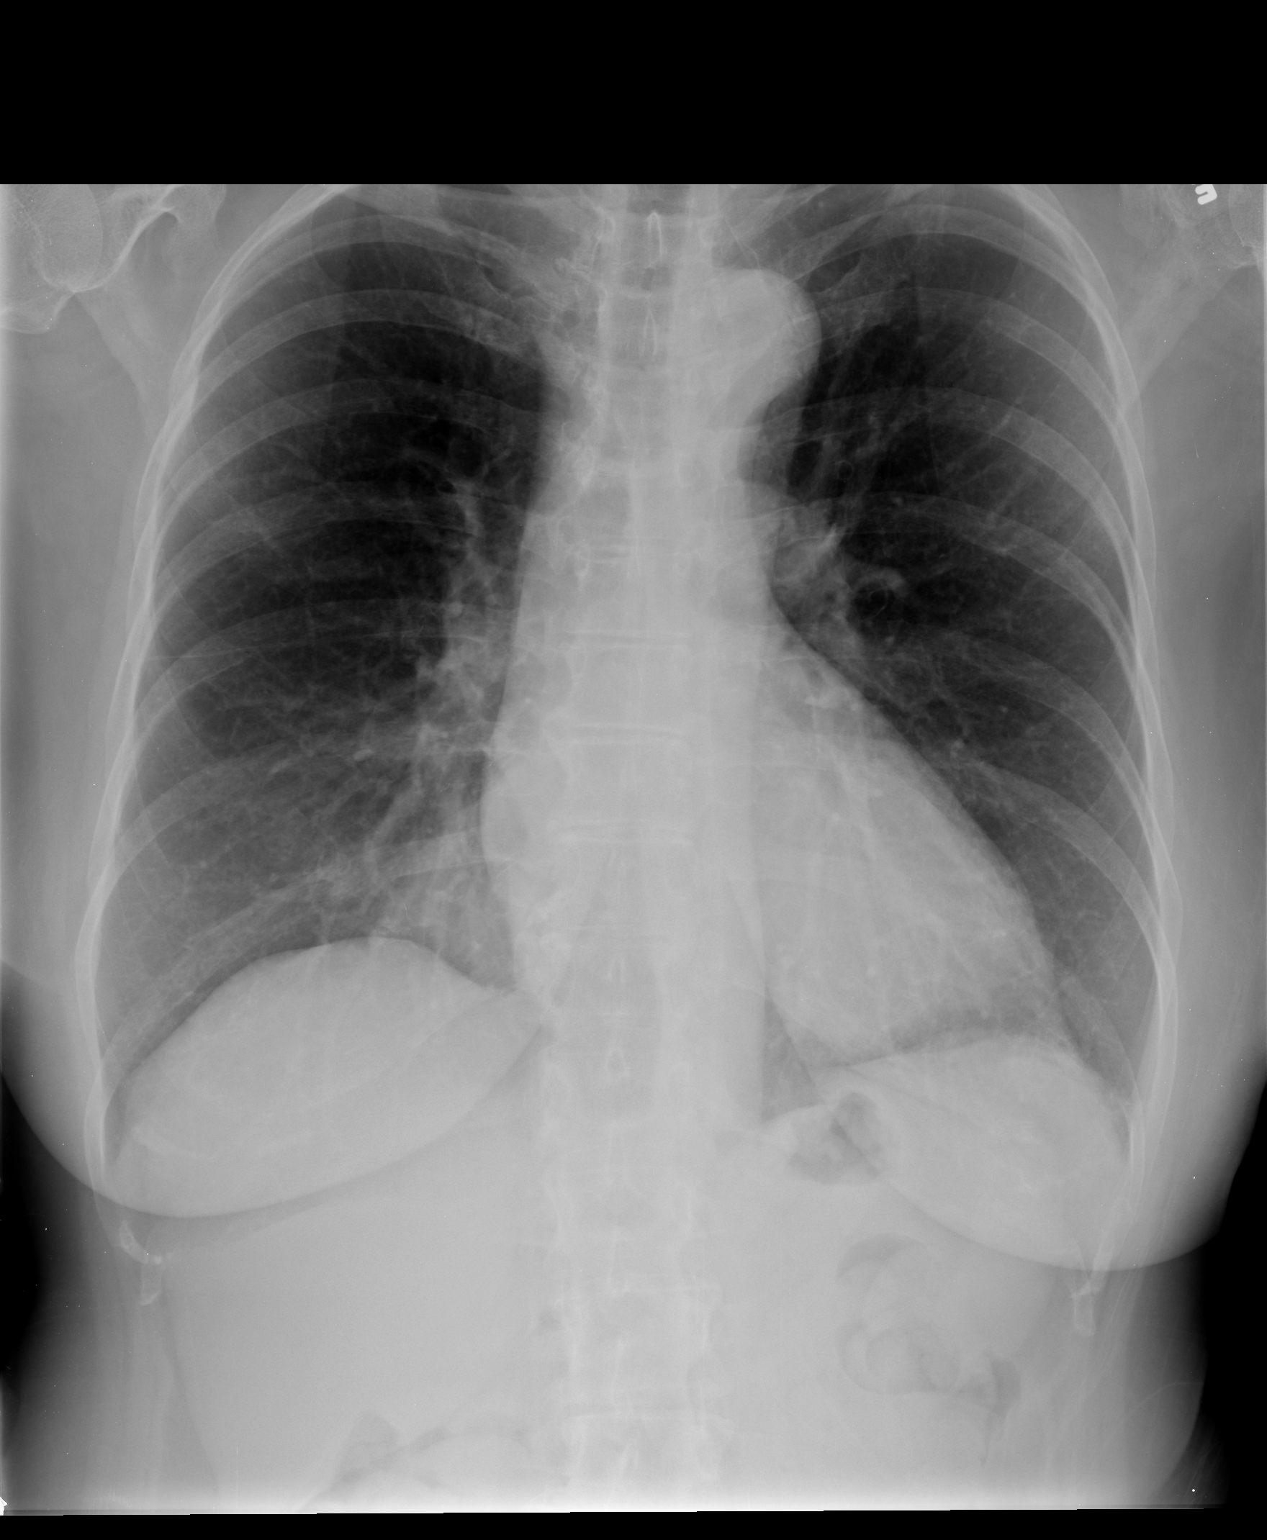

[view not recorded (2 of 2)]
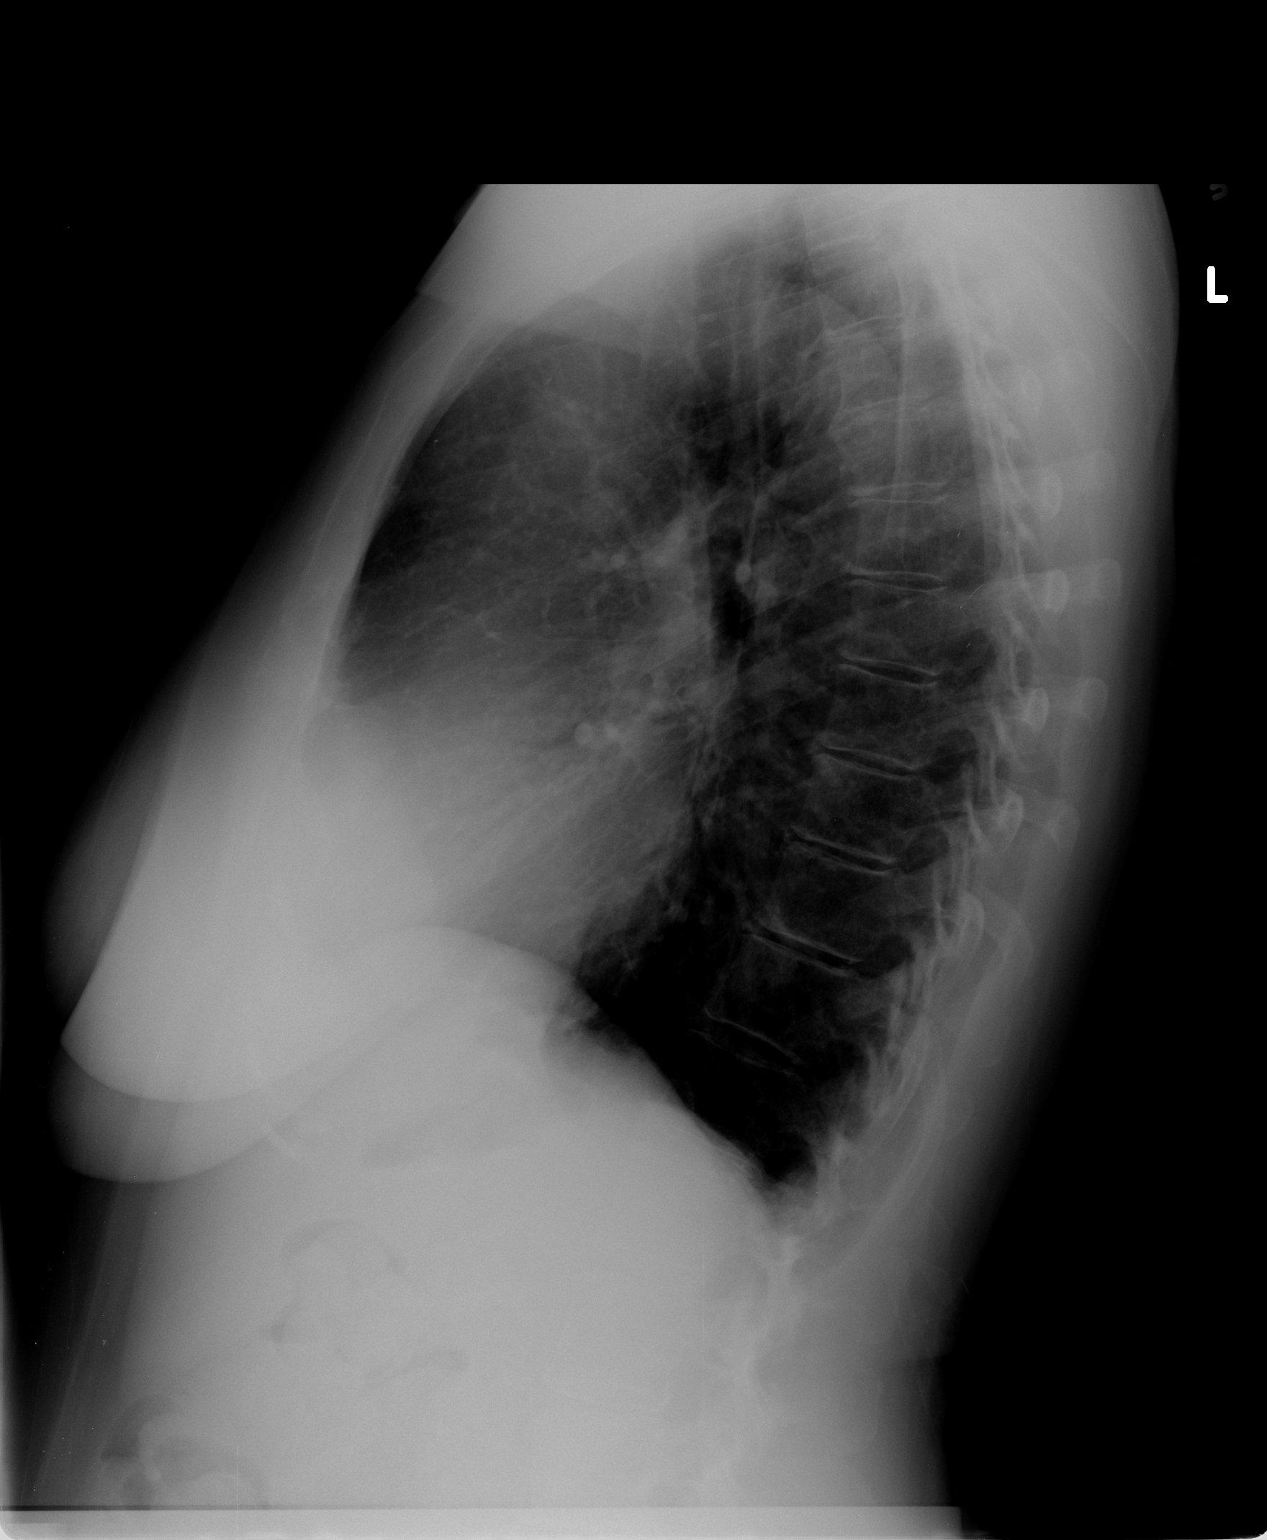

[2 of 2 positions shown; findings below may reference images not displayed]

FINDINGS: Heart size is upper normal.  Vascularity is normal.
Lungs are clear without infiltrate effusion or mass lesion.
IMPRESSION: No active cardiopulmonary disease.

## 2014-07-24 ENCOUNTER — Other Ambulatory Visit: Payer: Self-pay | Admitting: Internal Medicine

## 2014-07-25 ENCOUNTER — Other Ambulatory Visit
Admission: RE | Admit: 2014-07-25 | Discharge: 2014-07-25 | Disposition: A | Discharge status: Home / Self Care | Payer: Medicare Other | Source: Skilled Nursing Facility | Attending: Internal Medicine | Admitting: Internal Medicine

## 2014-07-25 DIAGNOSIS — M545 Low back pain: Secondary | ICD-10-CM | POA: Insufficient documentation

## 2014-07-25 DIAGNOSIS — R3915 Urgency of urination: Secondary | ICD-10-CM | POA: Insufficient documentation

## 2014-07-25 LAB — URINALYSIS COMPLETE WITH MICROSCOPIC (ARMC ONLY)
Bacteria, UA: NONE SEEN
Bilirubin Urine: NEGATIVE
Glucose, UA: NEGATIVE mg/dL
Hgb urine dipstick: NEGATIVE
Ketones, ur: NEGATIVE mg/dL
NITRITE: NEGATIVE
PH: 6 (ref 5.0–8.0)
Protein, ur: NEGATIVE mg/dL
RBC / HPF: NONE SEEN RBC/hpf (ref 0–5)
Specific Gravity, Urine: 1.006 (ref 1.005–1.030)

## 2014-07-31 LAB — URINE CULTURE: Culture: 50000

## 2014-08-02 ENCOUNTER — Other Ambulatory Visit: Payer: Self-pay | Admitting: Internal Medicine

## 2014-08-09 ENCOUNTER — Other Ambulatory Visit: Payer: Self-pay | Admitting: Internal Medicine

## 2014-08-12 ENCOUNTER — Other Ambulatory Visit: Payer: Self-pay | Admitting: Internal Medicine

## 2014-08-16 ENCOUNTER — Other Ambulatory Visit: Payer: Self-pay | Admitting: Internal Medicine

## 2014-09-03 ENCOUNTER — Ambulatory Visit (INDEPENDENT_AMBULATORY_CARE_PROVIDER_SITE_OTHER): Payer: Medicare Other | Admitting: Internal Medicine

## 2014-09-03 ENCOUNTER — Encounter: Payer: Self-pay | Admitting: Internal Medicine

## 2014-09-03 VITALS — BP 108/78 | HR 65 | Temp 97.4°F | Resp 14 | Ht 66.0 in | Wt 161.2 lb

## 2014-09-03 DIAGNOSIS — F329 Major depressive disorder, single episode, unspecified: Secondary | ICD-10-CM

## 2014-09-03 DIAGNOSIS — E7800 Pure hypercholesterolemia, unspecified: Secondary | ICD-10-CM

## 2014-09-03 DIAGNOSIS — G629 Polyneuropathy, unspecified: Secondary | ICD-10-CM

## 2014-09-03 DIAGNOSIS — Z86010 Personal history of colonic polyps: Secondary | ICD-10-CM

## 2014-09-03 DIAGNOSIS — Z0000 Encounter for general adult medical examination without abnormal findings: Secondary | ICD-10-CM | POA: Diagnosis not present

## 2014-09-03 DIAGNOSIS — F32A Depression, unspecified: Secondary | ICD-10-CM

## 2014-09-03 DIAGNOSIS — I10 Essential (primary) hypertension: Secondary | ICD-10-CM

## 2014-09-03 DIAGNOSIS — R739 Hyperglycemia, unspecified: Secondary | ICD-10-CM

## 2014-09-03 DIAGNOSIS — M25561 Pain in right knee: Secondary | ICD-10-CM

## 2014-09-03 DIAGNOSIS — K2970 Gastritis, unspecified, without bleeding: Secondary | ICD-10-CM

## 2014-09-03 DIAGNOSIS — Z1239 Encounter for other screening for malignant neoplasm of breast: Secondary | ICD-10-CM

## 2014-09-03 DIAGNOSIS — F419 Anxiety disorder, unspecified: Secondary | ICD-10-CM

## 2014-09-03 NOTE — Progress Notes (Signed)
Patient ID: [name], female   DOB: 1945/06/05, 69 y.o.   MRN: [id number]   Subjective:    Patient ID: [name], female    DOB: [birth year], 69 y.o.   MRN: [id number]  HPI  Patient here to follow up on her current medical issues as well as for a complete physical exam.  Increased stress.  Was seeing Dr Nicolasa Ducking.  Is no longer seeing.  Discussed with her today.  Discussed seeing her counselor.  She may f/u with the counselor.  Wants to hold on medication.  Tries to stay active.  Just had knee surgery.  Dong well from her surgery.  No chest pain or tightness.  No sob.  No acid reflux.  Bowels stable.     Past Medical History  Diagnosis Date  . Hypertension   . Anxiety   . Depression   . GERD (gastroesophageal reflux disease)   . Hypercholesterolemia   . Chronic insomnia   . COPD (chronic obstructive pulmonary disease)     documented in outside records  . Arthritis   . Heart murmur   . Osteoporosis   . Complication of anesthesia     "AFFECTS MY MEMORY"  . Shortness of breath     with exertion  . Tingling     both hands  . History of stomach ulcers   . Hyperparathyroidism, primary   . Burning feet syndrome   . Dysrhythmia     PALPITATIONS     Outpatient Encounter Prescriptions as of 09/03/2014  Medication Sig  . acetaminophen (TYLENOL) 500 MG tablet Take 1,000 mg by mouth every 6 (six) hours as needed for fever.   Marland Kitchen amLODipine (NORVASC) 5 MG tablet TAKE 1 TABLET (5 MG TOTAL) BY MOUTH DAILY.  Marland Kitchen atorvastatin (LIPITOR) 40 MG tablet TAKE 1 TABLET BY MOUTH ONCE DAILY  . Biotin (BIOTIN MAXIMUM STRENGTH) 10 MG TABS Take 1 tablet by mouth daily.  . Carboxymethylcellulose Sodium (REFRESH TEARS OP) Apply 1 drop to eye as needed (for dry eyes).  . chlorthalidone (HYGROTON) 25 MG tablet TAKE 1 TABLET (25 MG TOTAL) BY MOUTH DAILY.  . cholecalciferol (VITAMIN D) 1000 UNITS tablet Take 1,000 Units by mouth daily.   Marland Kitchen docusate sodium (COLACE) 100 MG capsule Take 100 mg by mouth 2  (two) times daily as needed for mild constipation.   . ferrous sulfate 325 (65 FE) MG tablet Take 325 mg by mouth daily.  Marland Kitchen gabapentin (NEURONTIN) 300 MG capsule Take 600 mg by mouth at bedtime.  Marland Kitchen KLOR-CON M10 10 MEQ tablet TAKE 1 TABLET BY MOUTH DAILY.  . magnesium oxide (MAG-OX) 400 MG tablet Take 400 mg by mouth daily.  . Multiple Vitamins-Minerals (MULTIVITAMIN PO) Take 1 tablet by mouth daily.  . Omega-3 Fatty Acids (OMEGA 3 PO) Take 1 capsule by mouth daily.   . pantoprazole (PROTONIX) 40 MG tablet TAKE 1 TABLET (40 MG TOTAL) BY MOUTH DAILY.  . valsartan (DIOVAN) 320 MG tablet TAKE 1 TABLET (320 MG TOTAL) BY MOUTH DAILY.  . [DISCONTINUED] enoxaparin (LOVENOX) 30 MG/0.3ML injection Inject 0.3 mLs (30 mg total) into the skin every 12 (twelve) hours.  . [DISCONTINUED] oxyCODONE (OXY IR/ROXICODONE) 5 MG immediate release tablet Take 1-2 tablets (5-10 mg total) by mouth every 3 (three) hours as needed for breakthrough pain.  . [DISCONTINUED] traMADol (ULTRAM) 50 MG tablet Take 1-2 tablets (50-100 mg total) by mouth every 4 (four) hours as needed for moderate pain.   No facility-administered encounter medications on file  as of 09/03/2014.    Review of Systems  Constitutional: Negative for appetite change and unexpected weight change.  HENT: Negative for congestion and sinus pressure.   Eyes: Negative for pain and visual disturbance.  Respiratory: Negative for cough, chest tightness and shortness of breath.   Cardiovascular: Negative for chest pain, palpitations and leg swelling.  Gastrointestinal: Negative for nausea, vomiting, abdominal pain and diarrhea.  Genitourinary: Negative for dysuria and difficulty urinating.  Musculoskeletal:       S/p knee surgery.   Doing well.  No increased erythema or swelling.   Skin: Negative for color change and rash.  Neurological: Negative for dizziness, light-headedness and headaches.  Hematological: Negative for adenopathy. Does not bruise/bleed  easily.  Psychiatric/Behavioral: Negative for dysphoric mood and agitation.       Objective:    Physical Exam  Constitutional: She is oriented to person, place, and time. She appears well-developed and well-nourished.  HENT:  Nose: Nose normal.  Mouth/Throat: Oropharynx is clear and moist.  Eyes: Right eye exhibits no discharge. Left eye exhibits no discharge. No scleral icterus.  Neck: Neck supple. No thyromegaly present.  Cardiovascular: Normal rate and regular rhythm.   Pulmonary/Chest: Breath sounds normal. No accessory muscle usage. No tachypnea. No respiratory distress. She has no decreased breath sounds. She has no wheezes. She has no rhonchi. Right breast exhibits no inverted nipple, no mass, no nipple discharge and no tenderness (no axillary adenopathy). Left breast exhibits no inverted nipple, no mass, no nipple discharge and no tenderness (no axilarry adenopathy).  Abdominal: Soft. Bowel sounds are normal. There is no tenderness.  Musculoskeletal: She exhibits no edema or tenderness.  Lymphadenopathy:    She has no cervical adenopathy.  Neurological: She is alert and oriented to person, place, and time.  Skin: Skin is warm. No rash noted.  Psychiatric: She has a normal mood and affect. Her behavior is normal.    BP 108/78 mmHg  Pulse 65  Temp(Src) 97.4 F (36.3 C) (Oral)  Resp 14  Ht 5\' 6"  (1.676 m)  Wt 161 lb 3.2 oz (73.12 kg)  BMI 26.03 kg/m2  SpO2 97% Wt Readings from Last 3 Encounters:  09/03/14 161 lb 3.2 oz (73.12 kg)  07/18/14 166 lb 12.8 oz (75.66 kg)  07/12/14 169 lb (76.658 kg)     Lab Results  Component Value Date   WBC 11.0 07/21/2014   HGB 11.7* 07/21/2014   HCT 33.8* 07/21/2014   PLT 236 07/21/2014   GLUCOSE 123* 07/19/2014   CHOL 194 05/24/2014   TRIG 144.0 05/24/2014   HDL 51.70 05/24/2014   LDLCALC 114* 05/24/2014   ALT 31 05/24/2014   AST 24 05/24/2014   NA 140 07/19/2014   K 3.1* 07/19/2014   CL 104 07/19/2014   CREATININE 0.69  07/19/2014   BUN 18 07/19/2014   CO2 28 07/19/2014   TSH 0.65 08/28/2013   INR 0.91 07/05/2014   HGBA1C 6.0 05/24/2014       Assessment & Plan:   Problem List Items Addressed This Visit    Anxiety    No longer seeing psychiatry.  Discussed with her today.  Hold on medication.  Follow.       Depression    No longer seeing psychiatry.  Discussed treatment options.  Discussed seeing her counselor.  Discussed medication.  She does plan to call her counselor.        Relevant Orders   CBC with Differential/Platelet   TSH   Gastritis  EGD 09/15/13 - gastritis and hiatal hernia.  Continue protonix.       Health care maintenance    Physical today - 09/03/14.  Mammogram 09/18/13 - Birads I.  Schedule f/u mammogram.  Colonoscopy 09/15/13.       History of colonic polyps    Colonoscopy 09/15/13 as outlined.  Recommended f/u colonoscopy 09/2023.        Hypercholesterolemia    Low cholesterol diet and exercise.  Follow lipid panel and liver function tests.  On lipitor.       Relevant Orders   Lipid panel   Hepatic function panel   Hypertension    Blood pressure has been doing relatively well.  Same medication regimen.  Follow pressures.  Follow metabolic panel.        Relevant Orders   Basic metabolic panel   Neuropathy    Taking gabapentin.        Right knee pain    S/p surgery.  Seeing ortho.        Other Visit Diagnoses    Breast cancer screening    -  Primary    Relevant Orders    MM DIGITAL SCREENING BILATERAL    Hyperglycemia        Relevant Orders    Hemoglobin A1c        Einar Pheasant, MD

## 2014-09-03 NOTE — Progress Notes (Signed)
Pre visit review using our clinic review tool, if applicable. No additional management support is needed unless otherwise documented below in the visit note. 

## 2014-09-05 ENCOUNTER — Encounter: Payer: Self-pay | Admitting: Internal Medicine

## 2014-09-05 NOTE — Assessment & Plan Note (Signed)
No longer seeing psychiatry.  Discussed with her today.  Hold on medication.  Follow.

## 2014-09-05 NOTE — Assessment & Plan Note (Signed)
Taking gabapentin 

## 2014-09-05 NOTE — Assessment & Plan Note (Signed)
No longer seeing psychiatry.  Discussed treatment options.  Discussed seeing her counselor.  Discussed medication.  She does plan to call her counselor.

## 2014-09-05 NOTE — Assessment & Plan Note (Signed)
Colonoscopy 09/15/13 as outlined.  Recommended f/u colonoscopy 09/2023.

## 2014-09-05 NOTE — Assessment & Plan Note (Signed)
Blood pressure has been doing relatively well.  Same medication regimen.  Follow pressures.  Follow metabolic panel.

## 2014-09-05 NOTE — Assessment & Plan Note (Signed)
S/p surgery.  Seeing ortho.

## 2014-09-05 NOTE — Assessment & Plan Note (Signed)
Low cholesterol diet and exercise.  Follow lipid panel and liver function tests.  On lipitor.   

## 2014-09-05 NOTE — Assessment & Plan Note (Signed)
EGD 09/15/13 - gastritis and hiatal hernia.  Continue protonix.

## 2014-09-05 NOTE — Assessment & Plan Note (Signed)
Physical today - 09/03/14.  Mammogram 09/18/13 - Birads I.  Schedule f/u mammogram.  Colonoscopy 09/15/13.

## 2014-09-18 ENCOUNTER — Encounter: Payer: Self-pay | Admitting: *Deleted

## 2014-09-18 ENCOUNTER — Other Ambulatory Visit (INDEPENDENT_AMBULATORY_CARE_PROVIDER_SITE_OTHER): Payer: Medicare Other

## 2014-09-18 DIAGNOSIS — E7800 Pure hypercholesterolemia, unspecified: Secondary | ICD-10-CM

## 2014-09-18 DIAGNOSIS — I10 Essential (primary) hypertension: Secondary | ICD-10-CM

## 2014-09-18 DIAGNOSIS — F32A Depression, unspecified: Secondary | ICD-10-CM

## 2014-09-18 DIAGNOSIS — R739 Hyperglycemia, unspecified: Secondary | ICD-10-CM | POA: Diagnosis not present

## 2014-09-18 DIAGNOSIS — F329 Major depressive disorder, single episode, unspecified: Secondary | ICD-10-CM | POA: Diagnosis not present

## 2014-09-18 LAB — CBC WITH DIFFERENTIAL/PLATELET
BASOS PCT: 0.7 % (ref 0.0–3.0)
Basophils Absolute: 0.1 10*3/uL (ref 0.0–0.1)
Eosinophils Absolute: 0.2 10*3/uL (ref 0.0–0.7)
Eosinophils Relative: 3 % (ref 0.0–5.0)
HCT: 40.1 % (ref 36.0–46.0)
Hemoglobin: 13.3 g/dL (ref 12.0–15.0)
Lymphocytes Relative: 34.8 % (ref 12.0–46.0)
Lymphs Abs: 2.6 10*3/uL (ref 0.7–4.0)
MCHC: 33 g/dL (ref 30.0–36.0)
MCV: 95 fl (ref 78.0–100.0)
MONO ABS: 0.9 10*3/uL (ref 0.1–1.0)
MONOS PCT: 11.4 % (ref 3.0–12.0)
Neutro Abs: 3.7 10*3/uL (ref 1.4–7.7)
Neutrophils Relative %: 50.1 % (ref 43.0–77.0)
PLATELETS: 324 10*3/uL (ref 150.0–400.0)
RBC: 4.22 Mil/uL (ref 3.87–5.11)
RDW: 14.3 % (ref 11.5–15.5)
WBC: 7.5 10*3/uL (ref 4.0–10.5)

## 2014-09-18 LAB — LIPID PANEL
Cholesterol: 190 mg/dL (ref 0–200)
HDL: 52.2 mg/dL (ref >39.00)
LDL Cholesterol: 100 mg/dL — ABNORMAL HIGH (ref 0–99)
NonHDL: 137.36
TRIGLYCERIDES: 189 mg/dL — AB (ref 0.0–149.0)
Total CHOL/HDL Ratio: 4
VLDL: 37.8 mg/dL (ref 0.0–40.0)

## 2014-09-18 LAB — BASIC METABOLIC PANEL
BUN: 19 mg/dL (ref 6–23)
CO2: 29 meq/L (ref 19–32)
CREATININE: 0.67 mg/dL (ref 0.40–1.20)
Calcium: 9.8 mg/dL (ref 8.4–10.5)
Chloride: 103 mEq/L (ref 96–112)
GFR: 93.17 mL/min (ref >60.00)
Glucose, Bld: 106 mg/dL — ABNORMAL HIGH (ref 70–99)
Potassium: 3.5 mEq/L (ref 3.5–5.1)
Sodium: 142 mEq/L (ref 135–145)

## 2014-09-18 LAB — HEPATIC FUNCTION PANEL
ALT: 15 U/L (ref 0–35)
AST: 16 U/L (ref 0–37)
Albumin: 4.2 g/dL (ref 3.5–5.2)
Alkaline Phosphatase: 91 U/L (ref 39–117)
BILIRUBIN DIRECT: 0.1 mg/dL (ref 0.0–0.3)
TOTAL PROTEIN: 6.8 g/dL (ref 6.0–8.3)
Total Bilirubin: 0.6 mg/dL (ref 0.2–1.2)

## 2014-09-18 LAB — HEMOGLOBIN A1C: HEMOGLOBIN A1C: 5.6 % (ref 4.6–6.5)

## 2014-09-18 LAB — TSH: TSH: 0.86 u[IU]/mL (ref 0.35–4.50)

## 2014-09-20 ENCOUNTER — Ambulatory Visit
Admission: RE | Admit: 2014-09-20 | Discharge: 2014-09-20 | Disposition: A | Discharge status: Home / Self Care | Payer: Medicare Other | Source: Ambulatory Visit | Attending: Internal Medicine | Admitting: Internal Medicine

## 2014-09-20 DIAGNOSIS — Z1231 Encounter for screening mammogram for malignant neoplasm of breast: Secondary | ICD-10-CM | POA: Insufficient documentation

## 2014-09-20 DIAGNOSIS — Z1239 Encounter for other screening for malignant neoplasm of breast: Secondary | ICD-10-CM

## 2014-10-10 ENCOUNTER — Ambulatory Visit (INDEPENDENT_AMBULATORY_CARE_PROVIDER_SITE_OTHER): Payer: Medicare Other | Admitting: Internal Medicine

## 2014-10-10 ENCOUNTER — Encounter: Payer: Self-pay | Admitting: Internal Medicine

## 2014-10-10 VITALS — BP 106/68 | HR 69 | Temp 98.5°F | Ht 66.0 in | Wt 161.4 lb

## 2014-10-10 DIAGNOSIS — I10 Essential (primary) hypertension: Secondary | ICD-10-CM

## 2014-10-10 DIAGNOSIS — E210 Primary hyperparathyroidism: Secondary | ICD-10-CM

## 2014-10-10 DIAGNOSIS — K2970 Gastritis, unspecified, without bleeding: Secondary | ICD-10-CM | POA: Diagnosis not present

## 2014-10-10 DIAGNOSIS — G629 Polyneuropathy, unspecified: Secondary | ICD-10-CM

## 2014-10-10 DIAGNOSIS — E7800 Pure hypercholesterolemia, unspecified: Secondary | ICD-10-CM

## 2014-10-10 DIAGNOSIS — F419 Anxiety disorder, unspecified: Secondary | ICD-10-CM

## 2014-10-10 DIAGNOSIS — Z86010 Personal history of colonic polyps: Secondary | ICD-10-CM

## 2014-10-10 DIAGNOSIS — F32A Depression, unspecified: Secondary | ICD-10-CM

## 2014-10-10 DIAGNOSIS — D649 Anemia, unspecified: Secondary | ICD-10-CM

## 2014-10-10 DIAGNOSIS — F329 Major depressive disorder, single episode, unspecified: Secondary | ICD-10-CM

## 2014-10-10 MED ORDER — PANTOPRAZOLE SODIUM 40 MG PO TBEC: 40.0000 mg | DELAYED_RELEASE_TABLET | Freq: Two times a day (BID) | ORAL | Status: DC

## 2014-10-10 NOTE — Progress Notes (Signed)
Patient ID: [name], female   DOB: 1945/11/29, 69 y.o.   MRN: [id number]   Subjective:    Patient ID: [name], female    DOB: 31-May-1945, 69 y.o.   MRN: [id number]  HPI  Patient here for a scheduled follow up.  She saw Dr Manuella Ghazi.  He started her on effexor.  She feels this is helping her leg pain.  She feels better since starting the medication.  Helped the jittery feeling.  Is taking protonix bid.  This has helped the acid reflux.  Acid reflux - resolved.  No abdomina pain or cramping.  Bowels stable.  No sob.  Overall feels better.     Past Medical History  Diagnosis Date  . Hypertension   . Anxiety   . Depression   . GERD (gastroesophageal reflux disease)   . Hypercholesterolemia   . Chronic insomnia   . COPD (chronic obstructive pulmonary disease)     documented in outside records  . Arthritis   . Heart murmur   . Osteoporosis   . Complication of anesthesia     "AFFECTS MY MEMORY"  . Shortness of breath     with exertion  . Tingling     both hands  . History of stomach ulcers   . Hyperparathyroidism, primary   . Burning feet syndrome   . Dysrhythmia     PALPITATIONS    Past Surgical History  Procedure Laterality Date  . Dilation and curettage of uterus      X2   . Pituitary surgery      FOR TUMOR 3 YRS   . Joint replacement      RT HIP  2013  (PARTIAL)   . Breast surgery      RT BREAST CYST REMOVED 15 YRS AGO   . Anterior cervical decomp/discectomy fusion  10/28/2011    Procedure: ANTERIOR CERVICAL DECOMPRESSION/DISCECTOMY FUSION 2 LEVELS;  Surgeon: Elaina Hoops, MD;  Location: [facility];  Service: Neurosurgery;  Laterality: N/A;  . Parathyroidectomy Left 08/25/2012    Procedure: PARATHYROIDECTOMY;  Surgeon: Earnstine Regal, MD;  Location: [facility];  Service: General;  Laterality: Left;  . Total knee arthroplasty Right 07/18/2014    Procedure: TOTAL KNEE ARTHROPLASTY;  Surgeon: Dereck Leep, MD;  Location: [facility] OR[facility];  Service: Orthopedics;  Laterality:  Right;  . Breast biopsy Right 1992    neg   Family History  Problem Relation Age of Onset  . Glaucoma Mother   . Hypertension Mother   . Thyroid disease Mother   . Stroke Father   . Hypertension Father    Social History   Social History  . Marital Status: Divorced    Spouse Name: N/A  . Number of Children: N/A  . Years of Education: N/A   Social History Main Topics  . Smoking status: Former Smoker    Quit date: 02/10/2008  . Smokeless tobacco: Never Used  . Alcohol Use: No  . Drug Use: No  . Sexual Activity: Not Asked   Other Topics Concern  . None   Social History Narrative    Outpatient Encounter Prescriptions as of 10/10/2014  Medication Sig  . acetaminophen (TYLENOL) 500 MG tablet Take 1,000 mg by mouth every 6 (six) hours as needed for fever.   Marland Kitchen amLODipine (NORVASC) 5 MG tablet TAKE 1 TABLET (5 MG TOTAL) BY MOUTH DAILY.  Marland Kitchen atorvastatin (LIPITOR) 40 MG tablet TAKE 1 TABLET BY MOUTH ONCE DAILY  . Biotin (BIOTIN MAXIMUM  STRENGTH) 10 MG TABS Take 1 tablet by mouth daily.  . Carboxymethylcellulose Sodium (REFRESH TEARS OP) Apply 1 drop to eye as needed (for dry eyes).  . chlorthalidone (HYGROTON) 25 MG tablet TAKE 1 TABLET (25 MG TOTAL) BY MOUTH DAILY.  . cholecalciferol (VITAMIN D) 1000 UNITS tablet Take 1,000 Units by mouth daily.   Marland Kitchen docusate sodium (COLACE) 100 MG capsule Take 100 mg by mouth 2 (two) times daily as needed for mild constipation.   . ferrous sulfate 325 (65 FE) MG tablet Take 325 mg by mouth daily.  Marland Kitchen gabapentin (NEURONTIN) 300 MG capsule Take 600 mg by mouth at bedtime.  Marland Kitchen KLOR-CON M10 10 MEQ tablet TAKE 1 TABLET BY MOUTH DAILY.  . magnesium oxide (MAG-OX) 400 MG tablet Take 400 mg by mouth daily.  . Multiple Vitamins-Minerals (MULTIVITAMIN PO) Take 1 tablet by mouth daily.  . Omega-3 Fatty Acids (OMEGA 3 PO) Take 1 capsule by mouth daily.   . pantoprazole (PROTONIX) 40 MG tablet Take 1 tablet (40 mg total) by mouth 2 (two) times daily.  .  valsartan (DIOVAN) 320 MG tablet TAKE 1 TABLET (320 MG TOTAL) BY MOUTH DAILY.  Marland Kitchen venlafaxine (EFFEXOR) 37.5 MG tablet Take 1 pill (37.5 mg) once a day for 2 weeks than increase to 2 pills (75mg ) daily.  . [DISCONTINUED] pantoprazole (PROTONIX) 40 MG tablet TAKE 1 TABLET (40 MG TOTAL) BY MOUTH DAILY.   No facility-administered encounter medications on file as of 10/10/2014.    Review of Systems  Constitutional: Negative for appetite change and unexpected weight change.  HENT: Negative for congestion and sinus pressure.   Eyes: Negative for pain and visual disturbance.  Respiratory: Negative for cough, chest tightness and shortness of breath.   Cardiovascular: Negative for chest pain, palpitations and leg swelling.  Gastrointestinal: Negative for nausea, vomiting, abdominal pain and diarrhea.  Genitourinary: Negative for dysuria and difficulty urinating.  Musculoskeletal:       Effexor helping the leg pain.  Feels better.   Skin: Negative for color change and rash.  Neurological: Negative for dizziness, light-headedness and headaches.  Psychiatric/Behavioral: Negative for dysphoric mood and agitation.       Objective:    Physical Exam  Constitutional: She appears well-developed and well-nourished. No distress.  HENT:  Nose: Nose normal.  Mouth/Throat: Oropharynx is clear and moist.  Eyes: Conjunctivae are normal. Right eye exhibits no discharge. Left eye exhibits no discharge.  Neck: Neck supple. No thyromegaly present.  Cardiovascular: Normal rate and regular rhythm.   Pulmonary/Chest: Breath sounds normal. No respiratory distress. She has no wheezes.  Abdominal: Soft. Bowel sounds are normal. There is no tenderness.  Musculoskeletal: She exhibits no edema or tenderness.  Lymphadenopathy:    She has no cervical adenopathy.  Skin: No rash noted. No erythema.  Psychiatric: She has a normal mood and affect. Her behavior is normal.    BP 106/68 mmHg  Pulse 69  Temp(Src) 98.5 F  (36.9 C) (Oral)  Ht 5\' 6"  (1.676 m)  Wt 161 lb 6.4 oz (73.211 kg)  BMI 26.06 kg/m2  SpO2 96% Wt Readings from Last 3 Encounters:  10/10/14 161 lb 6.4 oz (73.211 kg)  09/03/14 161 lb 3.2 oz (73.12 kg)  07/18/14 166 lb 12.8 oz (75.66 kg)     Lab Results  Component Value Date   WBC 7.5 09/18/2014   HGB 13.3 09/18/2014   HCT 40.1 09/18/2014   PLT 324.0 09/18/2014   GLUCOSE 106* 09/18/2014   CHOL 190 09/18/2014  TRIG 189.0* 09/18/2014   HDL 52.20 09/18/2014   LDLCALC 100* 09/18/2014   ALT 15 09/18/2014   AST 16 09/18/2014   NA 142 09/18/2014   K 3.5 09/18/2014   CL 103 09/18/2014   CREATININE 0.67 09/18/2014   BUN 19 09/18/2014   CO2 29 09/18/2014   TSH 0.86 09/18/2014   INR 0.91 07/05/2014   HGBA1C 5.6 09/18/2014    Mm Digital Screening Bilateral  09/20/2014   CLINICAL DATA:  Screening.  EXAM: DIGITAL SCREENING BILATERAL MAMMOGRAM WITH CAD  COMPARISON:  Previous exam(s).  ACR Breast Density Category c: The breast tissue is heterogeneously dense, which may obscure small masses.  FINDINGS: There are no findings suspicious for malignancy. Images were processed with CAD.  IMPRESSION: No mammographic evidence of malignancy. A result letter of this screening mammogram will be mailed directly to the patient.  RECOMMENDATION: Screening mammogram in one year. (Code:[id number]-B-01Y)  BI-RADS CATEGORY  1: Negative.   Electronically Signed   By: Lovey Newcomer M.D.   On: 09/20/2014 16:22       Assessment & Plan:   Problem List Items Addressed This Visit    Anemia    Last cbc wnl.  Follow.       Anxiety    No longer seeing psychiatry.  Feels better on effexor.  Follow.        Relevant Medications   venlafaxine (EFFEXOR) 37.5 MG tablet   Depression    No longer seeing psychiatry.  On effexor now.  Feels better.  Follow.       Relevant Medications   venlafaxine (EFFEXOR) 37.5 MG tablet   Gastritis    Had EGD 09/15/13 - gastritis and hiatal hernia.  On protonix bid now.  Follow.         History of colonic polyps    Colonoscopy 09/15/13 as outlined in overview.  Recommended f/u colonoscopy in 09/2023.        Hypercholesterolemia    Low cholesterol diet and exercise.  Follow lipid panel and liver function tests.  On lipitor.        Relevant Orders   Hepatic function panel   Lipid panel   Hyperparathyroidism, primary    S/p parathyroid surgery.  Followed by Dr Harlow Asa.       Hypertension - Primary    Blood pressure under good control.  Continue same medication regimen.  Follow pressures.  Follow metabolic panel.        Relevant Orders   Basic metabolic panel   Neuropathy    Taking gabapening.  Saw Dr Manuella Ghazi.  Started on effexor.  Feeling better.  Follow.           Einar Pheasant, MD

## 2014-10-10 NOTE — Progress Notes (Signed)
Pre visit review using our clinic review tool, if applicable. No additional management support is needed unless otherwise documented below in the visit note. 

## 2014-10-16 ENCOUNTER — Encounter: Payer: Self-pay | Admitting: Internal Medicine

## 2014-10-16 NOTE — Assessment & Plan Note (Signed)
No longer seeing psychiatry.  Feels better on effexor.  Follow.

## 2014-10-16 NOTE — Assessment & Plan Note (Signed)
Taking gabapening.  Saw Dr Manuella Ghazi.  Started on effexor.  Feeling better.  Follow.

## 2014-10-16 NOTE — Assessment & Plan Note (Signed)
S/p parathyroid surgery.  Followed by Dr Harlow Asa.

## 2014-10-16 NOTE — Assessment & Plan Note (Signed)
Colonoscopy 09/15/13 as outlined in overview.  Recommended f/u colonoscopy in 09/2023.

## 2014-10-16 NOTE — Assessment & Plan Note (Signed)
Blood pressure under good control.  Continue same medication regimen.  Follow pressures.  Follow metabolic panel.   

## 2014-10-16 NOTE — Assessment & Plan Note (Signed)
Had EGD 09/15/13 - gastritis and hiatal hernia.  On protonix bid now.  Follow.

## 2014-10-16 NOTE — Assessment & Plan Note (Signed)
No longer seeing psychiatry.  On effexor now.  Feels better.  Follow.

## 2014-10-16 NOTE — Assessment & Plan Note (Signed)
Last cbc wnl.  Follow.   

## 2014-10-16 NOTE — Assessment & Plan Note (Signed)
Low cholesterol diet and exercise.  Follow lipid panel and liver function tests.  On lipitor.   

## 2014-10-24 ENCOUNTER — Encounter: Payer: Self-pay | Admitting: *Deleted

## 2014-10-25 ENCOUNTER — Telehealth: Payer: Self-pay | Admitting: *Deleted

## 2014-10-25 NOTE — Telephone Encounter (Signed)
PA initiated via Cover my Meds for Pantopraozole 40mg  BID (Key: [id number])

## 2014-10-30 ENCOUNTER — Ambulatory Visit
Admission: RE | Admit: 2014-10-30 | Discharge: 2014-10-30 | Disposition: A | Discharge status: Home / Self Care | Payer: Medicare Other | Source: Ambulatory Visit | Attending: Ophthalmology | Admitting: Ophthalmology

## 2014-10-30 ENCOUNTER — Ambulatory Visit: Payer: Medicare Other | Admitting: Anesthesiology

## 2014-10-30 ENCOUNTER — Encounter
Admission: RE | Disposition: A | Discharge status: Home / Self Care | Payer: Self-pay | Source: Ambulatory Visit | Attending: Ophthalmology

## 2014-10-30 ENCOUNTER — Encounter: Payer: Self-pay | Admitting: *Deleted

## 2014-10-30 DIAGNOSIS — M1990 Unspecified osteoarthritis, unspecified site: Secondary | ICD-10-CM | POA: Diagnosis not present

## 2014-10-30 DIAGNOSIS — I10 Essential (primary) hypertension: Secondary | ICD-10-CM | POA: Diagnosis not present

## 2014-10-30 DIAGNOSIS — E780 Pure hypercholesterolemia: Secondary | ICD-10-CM | POA: Diagnosis not present

## 2014-10-30 DIAGNOSIS — Z96651 Presence of right artificial knee joint: Secondary | ICD-10-CM | POA: Diagnosis not present

## 2014-10-30 DIAGNOSIS — G629 Polyneuropathy, unspecified: Secondary | ICD-10-CM | POA: Diagnosis not present

## 2014-10-30 DIAGNOSIS — Z7982 Long term (current) use of aspirin: Secondary | ICD-10-CM | POA: Diagnosis not present

## 2014-10-30 DIAGNOSIS — M810 Age-related osteoporosis without current pathological fracture: Secondary | ICD-10-CM | POA: Insufficient documentation

## 2014-10-30 DIAGNOSIS — Z87891 Personal history of nicotine dependence: Secondary | ICD-10-CM | POA: Insufficient documentation

## 2014-10-30 DIAGNOSIS — H2511 Age-related nuclear cataract, right eye: Secondary | ICD-10-CM | POA: Diagnosis not present

## 2014-10-30 DIAGNOSIS — Z8711 Personal history of peptic ulcer disease: Secondary | ICD-10-CM | POA: Insufficient documentation

## 2014-10-30 DIAGNOSIS — Z9889 Other specified postprocedural states: Secondary | ICD-10-CM | POA: Insufficient documentation

## 2014-10-30 DIAGNOSIS — Z79899 Other long term (current) drug therapy: Secondary | ICD-10-CM | POA: Insufficient documentation

## 2014-10-30 DIAGNOSIS — M419 Scoliosis, unspecified: Secondary | ICD-10-CM | POA: Insufficient documentation

## 2014-10-30 DIAGNOSIS — K219 Gastro-esophageal reflux disease without esophagitis: Secondary | ICD-10-CM | POA: Diagnosis not present

## 2014-10-30 DIAGNOSIS — H269 Unspecified cataract: Secondary | ICD-10-CM | POA: Diagnosis present

## 2014-10-30 HISTORY — DX: Edema, unspecified: R60.9

## 2014-10-30 HISTORY — DX: Palpitations: R00.2

## 2014-10-30 HISTORY — PX: CATARACT EXTRACTION W/PHACO: SHX586

## 2014-10-30 HISTORY — DX: Scoliosis, unspecified: M41.9

## 2014-10-30 HISTORY — DX: Polyneuropathy, unspecified: G62.9

## 2014-10-30 HISTORY — DX: Anemia, unspecified: D64.9

## 2014-10-30 HISTORY — DX: Unspecified hearing loss, unspecified ear: H91.90

## 2014-10-30 SURGERY — PHACOEMULSIFICATION, CATARACT, WITH IOL INSERTION
Anesthesia: Monitor Anesthesia Care | Laterality: Right | Wound class: Clean

## 2014-10-30 MED ORDER — ONDANSETRON HCL 4 MG/2ML IJ SOLN: 4.0000 mg | Freq: Once | INTRAMUSCULAR | Status: DC | PRN

## 2014-10-30 MED ORDER — EPINEPHRINE HCL 1 MG/ML IJ SOLN
INTRAMUSCULAR | Status: DC | PRN
  Administered 2014-10-30: 250 mL via OPHTHALMIC

## 2014-10-30 MED ORDER — SODIUM CHLORIDE 0.9 % IV SOLN
INTRAVENOUS | Status: DC
  Administered 2014-10-30: 08:00:00 via INTRAVENOUS

## 2014-10-30 MED ORDER — NA CHONDROIT SULF-NA HYALURON 40-17 MG/ML IO SOLN
INTRAOCULAR | Status: AC
  Filled 2014-10-30: qty 1

## 2014-10-30 MED ORDER — TETRACAINE HCL 0.5 % OP SOLN
OPHTHALMIC | Status: AC
  Filled 2014-10-30: qty 2

## 2014-10-30 MED ORDER — ARMC OPHTHALMIC DILATING GEL
OPHTHALMIC | Status: AC
  Filled 2014-10-30: qty 0.25

## 2014-10-30 MED ORDER — ARMC OPHTHALMIC DILATING GEL
1.0000 "application " | OPHTHALMIC | Status: DC | PRN
  Administered 2014-10-30: 1 via OPHTHALMIC

## 2014-10-30 MED ORDER — CEFUROXIME OPHTHALMIC INJECTION 1 MG/0.1 ML
INJECTION | OPHTHALMIC | Status: AC
  Filled 2014-10-30: qty 0.1

## 2014-10-30 MED ORDER — MOXIFLOXACIN HCL 0.5 % OP SOLN
OPHTHALMIC | Status: AC
  Filled 2014-10-30: qty 3

## 2014-10-30 MED ORDER — FENTANYL CITRATE (PF) 100 MCG/2ML IJ SOLN: 25.0000 ug | INTRAMUSCULAR | Status: DC | PRN

## 2014-10-30 MED ORDER — POVIDONE-IODINE 5 % OP SOLN
1.0000 "application " | OPHTHALMIC | Status: AC | PRN
  Administered 2014-10-30: 1 via OPHTHALMIC

## 2014-10-30 MED ORDER — MOXIFLOXACIN HCL 0.5 % OP SOLN
OPHTHALMIC | Status: DC | PRN
  Administered 2014-10-30: 2 [drp] via OPHTHALMIC

## 2014-10-30 MED ORDER — MOXIFLOXACIN HCL 0.5 % OP SOLN: 1.0000 [drp] | OPHTHALMIC | Status: DC | PRN

## 2014-10-30 MED ORDER — EPINEPHRINE HCL 1 MG/ML IJ SOLN
INTRAMUSCULAR | Status: AC
  Filled 2014-10-30: qty 1

## 2014-10-30 MED ORDER — POVIDONE-IODINE 5 % OP SOLN
OPHTHALMIC | Status: AC
  Filled 2014-10-30: qty 30

## 2014-10-30 MED ORDER — FENTANYL CITRATE (PF) 100 MCG/2ML IJ SOLN
INTRAMUSCULAR | Status: DC | PRN
  Administered 2014-10-30: 50 ug via INTRAVENOUS

## 2014-10-30 MED ORDER — NA CHONDROIT SULF-NA HYALURON 40-17 MG/ML IO SOLN
INTRAOCULAR | Status: DC | PRN
  Administered 2014-10-30: 1 mL via INTRAOCULAR

## 2014-10-30 MED ORDER — TETRACAINE HCL 0.5 % OP SOLN
1.0000 [drp] | OPHTHALMIC | Status: AC | PRN
  Administered 2014-10-30: 1 [drp] via OPHTHALMIC

## 2014-10-30 MED ORDER — CEFUROXIME OPHTHALMIC INJECTION 1 MG/0.1 ML
INJECTION | OPHTHALMIC | Status: DC | PRN
Start: 2014-10-30 — End: 2014-10-30
  Administered 2014-10-30: .1 mL via INTRACAMERAL

## 2014-10-30 MED ORDER — CARBACHOL 0.01 % IO SOLN
INTRAOCULAR | Status: DC | PRN
  Administered 2014-10-30: .5 mL via INTRAOCULAR

## 2014-10-30 SURGICAL SUPPLY — 22 items
CANNULA ANT/CHMB 27GA (MISCELLANEOUS) ×2 IMPLANT
CUP MEDICINE 2OZ PLAST GRAD ST (MISCELLANEOUS) IMPLANT
GLOVE BIO SURGEON STRL SZ8 (GLOVE) ×2 IMPLANT
GLOVE BIOGEL M 6.5 STRL (GLOVE) ×2 IMPLANT
GLOVE SURG LX 8.0 MICRO (GLOVE) ×1
GLOVE SURG LX STRL 8.0 MICRO (GLOVE) ×1 IMPLANT
GOWN STRL REUS W/ TWL LRG LVL3 (GOWN DISPOSABLE) ×2 IMPLANT
GOWN STRL REUS W/TWL LRG LVL3 (GOWN DISPOSABLE) ×2
LENS IOL TECNIS 18.5 (Intraocular Lens) ×2 IMPLANT
LENS IOL TECNIS MONO 1P 18.5 (Intraocular Lens) ×1 IMPLANT
PACK CATARACT (MISCELLANEOUS) ×2 IMPLANT
PACK CATARACT BRASINGTON LX (MISCELLANEOUS) ×2 IMPLANT
PACK EYE AFTER SURG (MISCELLANEOUS) ×2 IMPLANT
SOL BSS BAG (MISCELLANEOUS) ×2
SOL PREP PVP 2OZ (MISCELLANEOUS)
SOLUTION BSS BAG (MISCELLANEOUS) ×1 IMPLANT
SOLUTION PREP PVP 2OZ (MISCELLANEOUS) IMPLANT
SYR 3ML LL SCALE MARK (SYRINGE) ×2 IMPLANT
SYR 5ML LL (SYRINGE) ×2 IMPLANT
SYR TB 1ML 27GX1/2 LL (SYRINGE) ×2 IMPLANT
WATER STERILE IRR 1000ML POUR (IV SOLUTION) ×2 IMPLANT
WIPE NON LINTING 3.25X3.25 (MISCELLANEOUS) ×2 IMPLANT

## 2014-10-30 NOTE — H&P (Signed)
  All labs reviewed. Abnormal studies sent to patients PCP when indicated.  Previous H&P reviewed, patient examined, there are NO CHANGES.  PORFILIO,Sheri LOUIS9/20/20168:19 AM

## 2014-10-30 NOTE — Transfer of Care (Addendum)
Immediate Anesthesia Transfer of Care Note  Patient: [name]  Procedure(s) Performed: Procedure(s) with comments: CATARACT EXTRACTION PHACO AND INTRAOCULAR LENS PLACEMENT (IOC) (Right) - US0:40 AP18.2 CDE7.24  casette lot #[id number]  Patient Location: PACU  Anesthesia Type:MAC  Level of Consciousness: awake, alert  and oriented  Airway & Oxygen Therapy: Patient Spontanous Breathing  Post-op Assessment: Report given to RN and Post -op Vital signs reviewed and stable  Post vital signs: Reviewed and stable  Last Vitals:  Filed Vitals:   10/30/14 0849  BP: 133/77  Pulse: 61  Temp:   Resp: 18  Temp 88.3  Complications: No apparent anesthesia complications

## 2014-10-30 NOTE — Addendum Note (Signed)
Addendum  created 10/30/14 5681 by Demetrius Charity, CRNA   Modules edited: Anesthesia Attestations

## 2014-10-30 NOTE — Discharge Instructions (Signed)
Eye Surgery Discharge Instructions  Expect mild scratchy sensation or mild soreness. DO NOT RUB YOUR EYE!  The day of surgery:  Minimal physical activity, but bed rest is not required  No reading, computer work, or close hand work  No bending, lifting, or straining.  May watch TV  For 24 hours:  No driving, legal decisions, or alcoholic beverages  Safety precautions  Eat anything you prefer: It is better to start with liquids, then soup then solid foods.  _____ Eye patch should be worn until postoperative exam tomorrow.  ____ Solar shield eyeglasses should be worn for comfort in the sunlight/patch while sleeping  Resume all regular medications including aspirin or Coumadin if these were discontinued prior to surgery. You may shower, bathe, shave, or wash your hair. Tylenol may be taken for mild discomfort.  Call your doctor if you experience significant pain, nausea, or vomiting, fever > 101 or other signs of infection. [phone] or [phone] Specific instructions:  Follow-up Information    Follow up with Tim Lair, MD On 10/31/2014.   Specialty:  Ophthalmology   Why:  10:00   Contact information:   [address] [place] [address] [phone]

## 2014-10-30 NOTE — Op Note (Signed)
PREOPERATIVE DIAGNOSIS:  Nuclear sclerotic cataract of the right eye.   POSTOPERATIVE DIAGNOSIS: nuclear sclerotic cataract right eye   OPERATIVE PROCEDURE:  Procedure(s): CATARACT EXTRACTION PHACO AND INTRAOCULAR LENS PLACEMENT (IOC)   SURGEON:  Birder Robson, MD.   ANESTHESIA:  Anesthesiologist: Gijsbertus Lonia Mad, MD CRNA: Demetrius Charity, CRNA; Delaney Meigs, CRNA  1.      Managed anesthesia care. 2.      Topical tetracaine drops followed by 2% Xylocaine jelly applied in the preoperative holding area.   COMPLICATIONS:  None.   TECHNIQUE:   Stop and chop   DESCRIPTION OF PROCEDURE:  The patient was examined and consented in the preoperative holding area where the aforementioned topical anesthesia was applied to the right eye and then brought back to the Operating Room where the right eye was prepped and draped in the usual sterile ophthalmic fashion and a lid speculum was placed. A paracentesis was created with the side port blade and the anterior chamber was filled with viscoelastic. A near clear corneal incision was performed with the steel keratome. A continuous curvilinear capsulorrhexis was performed with a cystotome followed by the capsulorrhexis forceps. Hydrodissection and hydrodelineation were carried out with BSS on a blunt cannula. The lens was removed in a stop and chop  technique and the remaining cortical material was removed with the irrigation-aspiration handpiece. The capsular bag was inflated with viscoelastic and the Technis ZCB00  lens was placed in the capsular bag without complication. The remaining viscoelastic was removed from the eye with the irrigation-aspiration handpiece. The wounds were hydrated. The anterior chamber was flushed with Miostat and the eye was inflated to physiologic pressure. 0.1 mL of cefuroxime concentration 10 mg/mL was placed in the anterior chamber. The wounds were found to be water tight. The eye was dressed with Vigamox. The patient  was given protective glasses to wear throughout the day and a shield with which to sleep tonight. The patient was also given drops with which to begin a drop regimen today and will follow-up with me in one day.  Implant Name Type Inv. Item Serial No. Manufacturer Lot No. LRB No. Used  LENS IMPL INTRAOC ZCB00 18.5 - [id number] Intraocular Lens LENS IMPL INTRAOC ZCB00 18.5 [id number]   Right 1   Procedure(s) with comments: CATARACT EXTRACTION PHACO AND INTRAOCULAR LENS PLACEMENT (IOC) (Right) - US0:40 AP18.2 CDE7.56  casette lot #[id number]  Electronically signed: PORFILIO,WILLIAM LOUIS 10/30/2014 8:46 AM

## 2014-10-30 NOTE — Anesthesia Preprocedure Evaluation (Signed)
Anesthesia Evaluation   Patient awake    Reviewed: Allergy & Precautions, NPO status , Patient's Chart, lab work & pertinent test results  Airway Mallampati: II       Dental no notable dental hx.    Pulmonary shortness of breath and with exertion, COPD, former smoker,     + decreased breath sounds      Cardiovascular hypertension, Pt. on medications Normal cardiovascular exam     Neuro/Psych Anxiety Depression    GI/Hepatic Neg liver ROS, GERD  ,  Endo/Other  negative endocrine ROS  Renal/GU negative Renal ROS     Musculoskeletal  (+) Arthritis ,   Abdominal   Peds  Hematology  (+) anemia ,   Anesthesia Other Findings   Reproductive/Obstetrics                             Anesthesia Physical Anesthesia Plan  ASA: III  Anesthesia Plan: MAC   Post-op Pain Management:    Induction: Intravenous  Airway Management Planned: Nasal Cannula  Additional Equipment:   Intra-op Plan:   Post-operative Plan:   Informed Consent: I have reviewed the patients History and Physical, chart, labs and discussed the procedure including the risks, benefits and alternatives for the proposed anesthesia with the patient or authorized representative who has indicated his/her understanding and acceptance.     Plan Discussed with: CRNA  Anesthesia Plan Comments:         Anesthesia Quick Evaluation

## 2014-10-30 NOTE — Anesthesia Postprocedure Evaluation (Addendum)
  Anesthesia Post-op Note  Patient: [name]  Procedure(s) Performed: Procedure(s) with comments: CATARACT EXTRACTION PHACO AND INTRAOCULAR LENS PLACEMENT (IOC) (Right) - US0:40 AP18.2 CDE7.67  casette lot #[id number] h  Anesthesia type:MAC  Patient location: PACU  Post pain: Pain level controlled  Post assessment: Post-op Vital signs reviewed, Patient's Cardiovascular Status Stable, Respiratory Function Stable, Patent Airway and No signs of Nausea or vomiting  Post vital signs: Reviewed and stable  Last Vitals:  Filed Vitals:   10/30/14 0849  BP: 133/77  Pulse: 61  Temp:   Resp: 18  Temp 97.5  Level of consciousness: awake, alert  and patient cooperative  Complications: No apparent anesthesia complications

## 2014-11-05 NOTE — Addendum Note (Signed)
Addendum  created 11/05/14 1633 by Iver Nestle, MD   Modules edited: Anesthesia Attestations

## 2014-11-07 ENCOUNTER — Other Ambulatory Visit: Payer: Self-pay

## 2014-11-07 NOTE — Telephone Encounter (Signed)
Medication was filled on 10/10/14 with 180 with 1 refill. Please advise

## 2014-11-07 NOTE — Telephone Encounter (Signed)
If this was refilled then she should not need another refill.  Please confirm with pt and see if she is aware of having the refill.   Thanks

## 2014-11-08 NOTE — Telephone Encounter (Signed)
PA approved 11/07/14 for one year. Pt & pharmacy notifed

## 2014-11-08 NOTE — Telephone Encounter (Signed)
PA was approved for Pantoprazole 40mg  BID #180 Eff: 11/07/14-11/07/15. Pt & pharmacy notified

## 2014-11-21 ENCOUNTER — Encounter: Payer: Self-pay | Admitting: *Deleted

## 2014-11-27 ENCOUNTER — Encounter
Admission: RE | Disposition: A | Discharge status: Home / Self Care | Payer: Self-pay | Source: Ambulatory Visit | Attending: Ophthalmology

## 2014-11-27 ENCOUNTER — Ambulatory Visit: Payer: Medicare Other | Admitting: Anesthesiology

## 2014-11-27 ENCOUNTER — Encounter: Payer: Self-pay | Admitting: Anesthesiology

## 2014-11-27 ENCOUNTER — Ambulatory Visit
Admission: RE | Admit: 2014-11-27 | Discharge: 2014-11-27 | Disposition: A | Discharge status: Home / Self Care | Payer: Medicare Other | Source: Ambulatory Visit | Attending: Ophthalmology | Admitting: Ophthalmology

## 2014-11-27 DIAGNOSIS — Z9841 Cataract extraction status, right eye: Secondary | ICD-10-CM | POA: Insufficient documentation

## 2014-11-27 DIAGNOSIS — H9193 Unspecified hearing loss, bilateral: Secondary | ICD-10-CM | POA: Insufficient documentation

## 2014-11-27 DIAGNOSIS — M1990 Unspecified osteoarthritis, unspecified site: Secondary | ICD-10-CM | POA: Insufficient documentation

## 2014-11-27 DIAGNOSIS — R002 Palpitations: Secondary | ICD-10-CM | POA: Diagnosis not present

## 2014-11-27 DIAGNOSIS — Z87891 Personal history of nicotine dependence: Secondary | ICD-10-CM | POA: Diagnosis not present

## 2014-11-27 DIAGNOSIS — M25473 Effusion, unspecified ankle: Secondary | ICD-10-CM | POA: Diagnosis not present

## 2014-11-27 DIAGNOSIS — Z96651 Presence of right artificial knee joint: Secondary | ICD-10-CM | POA: Diagnosis not present

## 2014-11-27 DIAGNOSIS — K219 Gastro-esophageal reflux disease without esophagitis: Secondary | ICD-10-CM | POA: Insufficient documentation

## 2014-11-27 DIAGNOSIS — J449 Chronic obstructive pulmonary disease, unspecified: Secondary | ICD-10-CM | POA: Insufficient documentation

## 2014-11-27 DIAGNOSIS — E7800 Pure hypercholesterolemia, unspecified: Secondary | ICD-10-CM | POA: Diagnosis not present

## 2014-11-27 DIAGNOSIS — I10 Essential (primary) hypertension: Secondary | ICD-10-CM | POA: Insufficient documentation

## 2014-11-27 DIAGNOSIS — H2512 Age-related nuclear cataract, left eye: Secondary | ICD-10-CM | POA: Insufficient documentation

## 2014-11-27 DIAGNOSIS — M810 Age-related osteoporosis without current pathological fracture: Secondary | ICD-10-CM | POA: Diagnosis not present

## 2014-11-27 DIAGNOSIS — F329 Major depressive disorder, single episode, unspecified: Secondary | ICD-10-CM | POA: Diagnosis not present

## 2014-11-27 DIAGNOSIS — F5104 Psychophysiologic insomnia: Secondary | ICD-10-CM | POA: Insufficient documentation

## 2014-11-27 DIAGNOSIS — D649 Anemia, unspecified: Secondary | ICD-10-CM | POA: Diagnosis not present

## 2014-11-27 DIAGNOSIS — M419 Scoliosis, unspecified: Secondary | ICD-10-CM | POA: Insufficient documentation

## 2014-11-27 DIAGNOSIS — R011 Cardiac murmur, unspecified: Secondary | ICD-10-CM | POA: Insufficient documentation

## 2014-11-27 DIAGNOSIS — G629 Polyneuropathy, unspecified: Secondary | ICD-10-CM | POA: Insufficient documentation

## 2014-11-27 DIAGNOSIS — R0602 Shortness of breath: Secondary | ICD-10-CM | POA: Insufficient documentation

## 2014-11-27 HISTORY — PX: CATARACT EXTRACTION W/PHACO: SHX586

## 2014-11-27 SURGERY — PHACOEMULSIFICATION, CATARACT, WITH IOL INSERTION
Anesthesia: Monitor Anesthesia Care | Site: Eye | Laterality: Left | Wound class: Clean

## 2014-11-27 MED ORDER — FENTANYL CITRATE (PF) 100 MCG/2ML IJ SOLN
INTRAMUSCULAR | Status: DC | PRN
  Administered 2014-11-27: 50 ug via INTRAVENOUS

## 2014-11-27 MED ORDER — ARMC OPHTHALMIC DILATING GEL
1.0000 "application " | OPHTHALMIC | Status: AC | PRN
  Administered 2014-11-27 (×2): 1 via OPHTHALMIC

## 2014-11-27 MED ORDER — MOXIFLOXACIN HCL 0.5 % OP SOLN: 1.0000 [drp] | OPHTHALMIC | Status: DC | PRN

## 2014-11-27 MED ORDER — SODIUM CHLORIDE 0.9 % IV SOLN
INTRAVENOUS | Status: DC
  Administered 2014-11-27: 09:00:00 via INTRAVENOUS

## 2014-11-27 MED ORDER — POVIDONE-IODINE 5 % OP SOLN
1.0000 "application " | OPHTHALMIC | Status: AC | PRN
  Administered 2014-11-27: 1 via OPHTHALMIC

## 2014-11-27 MED ORDER — CARBACHOL 0.01 % IO SOLN
INTRAOCULAR | Status: DC | PRN
  Administered 2014-11-27: 0.5 mL via INTRAOCULAR

## 2014-11-27 MED ORDER — MIDAZOLAM HCL 2 MG/2ML IJ SOLN
INTRAMUSCULAR | Status: DC | PRN
  Administered 2014-11-27: 1 mg via INTRAVENOUS

## 2014-11-27 MED ORDER — NA CHONDROIT SULF-NA HYALURON 40-17 MG/ML IO SOLN
INTRAOCULAR | Status: AC
  Filled 2014-11-27: qty 1

## 2014-11-27 MED ORDER — MOXIFLOXACIN HCL 0.5 % OP SOLN
OPHTHALMIC | Status: AC
  Filled 2014-11-27: qty 3

## 2014-11-27 MED ORDER — MOXIFLOXACIN HCL 0.5 % OP SOLN
OPHTHALMIC | Status: DC | PRN
  Administered 2014-11-27: 2 [drp] via OPHTHALMIC

## 2014-11-27 MED ORDER — POVIDONE-IODINE 5 % OP SOLN
OPHTHALMIC | Status: AC
  Administered 2014-11-27: 1 via OPHTHALMIC
  Filled 2014-11-27: qty 30

## 2014-11-27 MED ORDER — ARMC OPHTHALMIC DILATING GEL
OPHTHALMIC | Status: AC
  Administered 2014-11-27: 1 via OPHTHALMIC
  Filled 2014-11-27: qty 0.25

## 2014-11-27 MED ORDER — NA CHONDROIT SULF-NA HYALURON 40-17 MG/ML IO SOLN
INTRAOCULAR | Status: DC | PRN
  Administered 2014-11-27: 1 mL via INTRAOCULAR

## 2014-11-27 MED ORDER — EPINEPHRINE HCL 1 MG/ML IJ SOLN
INTRAMUSCULAR | Status: AC
  Filled 2014-11-27: qty 1

## 2014-11-27 MED ORDER — TETRACAINE HCL 0.5 % OP SOLN
1.0000 [drp] | OPHTHALMIC | Status: AC | PRN
  Administered 2014-11-27: 1 [drp] via OPHTHALMIC

## 2014-11-27 MED ORDER — EPINEPHRINE HCL 1 MG/ML IJ SOLN
INTRAOCULAR | Status: DC | PRN
  Administered 2014-11-27: 200 mL via OPHTHALMIC

## 2014-11-27 MED ORDER — TETRACAINE HCL 0.5 % OP SOLN
OPHTHALMIC | Status: AC
  Administered 2014-11-27: 1 [drp] via OPHTHALMIC
  Filled 2014-11-27: qty 2

## 2014-11-27 MED ORDER — CEFUROXIME OPHTHALMIC INJECTION 1 MG/0.1 ML
INJECTION | OPHTHALMIC | Status: AC
  Filled 2014-11-27: qty 0.1

## 2014-11-27 SURGICAL SUPPLY — 22 items
CANNULA ANT/CHMB 27GA (MISCELLANEOUS) ×2 IMPLANT
CUP MEDICINE 2OZ PLAST GRAD ST (MISCELLANEOUS) ×2 IMPLANT
GLOVE BIO SURGEON STRL SZ8 (GLOVE) ×2 IMPLANT
GLOVE BIOGEL M 6.5 STRL (GLOVE) ×2 IMPLANT
GLOVE SURG LX 8.0 MICRO (GLOVE) ×1
GLOVE SURG LX STRL 8.0 MICRO (GLOVE) ×1 IMPLANT
GOWN STRL REUS W/ TWL LRG LVL3 (GOWN DISPOSABLE) ×2 IMPLANT
GOWN STRL REUS W/TWL LRG LVL3 (GOWN DISPOSABLE) ×2
LENS IOL TECNIS 20.5 (Intraocular Lens) ×2 IMPLANT
LENS IOL TECNIS MONO 1P 20.5 (Intraocular Lens) ×1 IMPLANT
PACK CATARACT (MISCELLANEOUS) ×2 IMPLANT
PACK CATARACT BRASINGTON LX (MISCELLANEOUS) ×2 IMPLANT
PACK EYE AFTER SURG (MISCELLANEOUS) ×2 IMPLANT
SOL BSS BAG (MISCELLANEOUS) ×2
SOL PREP PVP 2OZ (MISCELLANEOUS) ×2
SOLUTION BSS BAG (MISCELLANEOUS) ×1 IMPLANT
SOLUTION PREP PVP 2OZ (MISCELLANEOUS) ×1 IMPLANT
SYR 3ML LL SCALE MARK (SYRINGE) ×2 IMPLANT
SYR 5ML LL (SYRINGE) ×2 IMPLANT
SYR TB 1ML 27GX1/2 LL (SYRINGE) ×2 IMPLANT
WATER STERILE IRR 1000ML POUR (IV SOLUTION) ×2 IMPLANT
WIPE NON LINTING 3.25X3.25 (MISCELLANEOUS) ×2 IMPLANT

## 2014-11-27 NOTE — Discharge Instructions (Signed)
Eye Surgery Discharge Instructions  Expect mild scratchy sensation or mild soreness. DO NOT RUB YOUR EYE!  The day of surgery:  Minimal physical activity, but bed rest is not required  No reading, computer work, or close hand work  No bending, lifting, or straining.  May watch TV  For 24 hours:  No driving, legal decisions, or alcoholic beverages  Safety precautions  Eat anything you prefer: It is better to start with liquids, then soup then solid foods.  _____ Eye patch should be worn until postoperative exam tomorrow.  ____ Solar shield eyeglasses should be worn for comfort in the sunlight/patch while sleeping  Resume all regular medications including aspirin or Coumadin if these were discontinued prior to surgery. You may shower, bathe, shave, or wash your hair. Tylenol may be taken for mild discomfort.  Call your doctor if you experience significant pain, nausea, or vomiting, fever > 101 or other signs of infection. [phone] or [phone] Specific instructions:  Follow-up Information    Follow up with Tim Lair, MD On 11/28/2014.   Specialty:  Ophthalmology   Why:  at10:00   Contact information:   [address] [place] [address] [phone]

## 2014-11-27 NOTE — H&P (Signed)
  All labs reviewed. Abnormal studies sent to patients PCP when indicated.  Previous H&P reviewed, patient examined, there are NO CHANGES.  [name] LOUIS10/18/20168:51 AM

## 2014-11-27 NOTE — Anesthesia Postprocedure Evaluation (Signed)
  Anesthesia Post-op Note  Patient: [name]  Procedure(s) Performed: Procedure(s) with comments: CATARACT EXTRACTION PHACO AND INTRAOCULAR LENS PLACEMENT (IOC) (Left) - [place] 00:36.9 AP%: 18.9 CDE: 6.96  casette lot #  [id number]  Anesthesia type:MAC  Patient location: PACU  Post pain: Pain level controlled  Post assessment: Post-op Vital signs reviewed, Patient's Cardiovascular Status Stable, Respiratory Function Stable, Patent Airway and No signs of Nausea or vomiting  Post vital signs: Reviewed and stable  Last Vitals:  Filed Vitals:   11/27/14 0941  BP: 154/85  Pulse: 55  Temp:   Resp: 18    Level of consciousness: awake, alert  and patient cooperative  Complications: No apparent anesthesia complications

## 2014-11-27 NOTE — Op Note (Signed)
PREOPERATIVE DIAGNOSIS:  Nuclear sclerotic cataract of the left eye.   POSTOPERATIVE DIAGNOSIS:  nuclear sclerotic cataract left eye   OPERATIVE PROCEDURE:  Procedure(s): CATARACT EXTRACTION PHACO AND INTRAOCULAR LENS PLACEMENT (IOC)   SURGEON:  Birder Robson, MD.   ANESTHESIA:   Anesthesiologist: Andria Frames, MD CRNA: Vaughan Sine  1.      Managed anesthesia care. 2.      Topical tetracaine drops followed by 2% Xylocaine jelly applied in the preoperative holding area.   COMPLICATIONS:  None.   TECHNIQUE:   Stop and chop   DESCRIPTION OF PROCEDURE:  The patient was examined and consented in the preoperative holding area where the aforementioned topical anesthesia was applied to the left eye and then brought back to the Operating Room where the left eye was prepped and draped in the usual sterile ophthalmic fashion and a lid speculum was placed. A paracentesis was created with the side port blade and the anterior chamber was filled with viscoelastic. A near clear corneal incision was performed with the steel keratome. A continuous curvilinear capsulorrhexis was performed with a cystotome followed by the capsulorrhexis forceps. Hydrodissection and hydrodelineation were carried out with BSS on a blunt cannula. The lens was removed in a stop and chop  technique and the remaining cortical material was removed with the irrigation-aspiration handpiece. The capsular bag was inflated with viscoelastic and the Technis ZCB00 lens was placed in the capsular bag without complication. The remaining viscoelastic was removed from the eye with the irrigation-aspiration handpiece. The wounds were hydrated. The anterior chamber was flushed with Miostat and the eye was inflated to physiologic pressure. 0.1 mL of cefuroxime concentration 10 mg/mL was placed in the anterior chamber. The wounds were found to be water tight. The eye was dressed with Vigamox. The patient was given protective glasses to  wear throughout the day and a shield with which to sleep tonight. The patient was also given drops with which to begin a drop regimen today and will follow-up with me in one day.  Implant Name Type Inv. Item Serial No. Manufacturer Lot No. LRB No. Used  LENS IMPL INTRAOC ZCB00 20.5 - [id number] Intraocular Lens LENS IMPL INTRAOC ZCB00 20.5 [id number] AMO   Left 1   Procedure(s) with comments: CATARACT EXTRACTION PHACO AND INTRAOCULAR LENS PLACEMENT (IOC) (Left) - [place] 00:36.9 AP%: 18.9 CDE: 6.96  casette lot #  [id number]  Electronically signed: Cecilia 11/27/2014 9:19 AM

## 2014-11-27 NOTE — Transfer of Care (Signed)
2Immediate Anesthesia Transfer of Care Note  Patient: [name]  Procedure(s) Performed: Procedure(s) with comments: CATARACT EXTRACTION PHACO AND INTRAOCULAR LENS PLACEMENT (IOC) (Left) - [place] 00:36.9 AP%: 18.9 CDE: 6.96  casette lot #  [id number]  Patient Location: PACU  Anesthesia Type:MAC  Level of Consciousness: awake, alert  and oriented  Airway & Oxygen Therapy: Patient Spontanous Breathing  Post-op Assessment: Report given to RN and Post -op Vital signs reviewed and stable  Post vital signs: Reviewed and stable  Last Vitals:  Filed Vitals:   11/27/14 0753  BP: 139/81  Pulse: 96  Temp: 36.8 C  Resp: 20    Complications: No apparent anesthesia complications

## 2014-11-27 NOTE — Anesthesia Procedure Notes (Signed)
Procedure Name: MAC Performed by: COOK-MARTIN, Briele Lagasse Pre-anesthesia Checklist: Patient identified, Emergency Drugs available, Suction available, Patient being monitored and Timeout performed Patient Re-evaluated:Patient Re-evaluated prior to inductionOxygen Delivery Method: Nasal cannula Preoxygenation: Pre-oxygenation with 100% oxygen Intubation Type: IV induction Placement Confirmation: positive ETCO2 and CO2 detector       

## 2014-11-27 NOTE — Anesthesia Preprocedure Evaluation (Signed)
Anesthesia Evaluation  Patient identified by MRN, date of birth, ID band Patient awake    Reviewed: Allergy & Precautions, H&P , NPO status , Patient's Chart, lab work & pertinent test results  History of Anesthesia Complications (+) history of anesthetic complications  Airway Mallampati: II  TM Distance: >3 FB Neck ROM: full    Dental no notable dental hx. (+) Teeth Intact   Pulmonary shortness of breath and with exertion, COPD, former smoker,    breath sounds clear to auscultation + decreased breath sounds      Cardiovascular hypertension, Pt. on medications Normal cardiovascular exam+ dysrhythmias + Valvular Problems/Murmurs  Rhythm:regular Rate:Normal     Neuro/Psych PSYCHIATRIC DISORDERS  Neuromuscular disease    GI/Hepatic Neg liver ROS, GERD  Controlled,  Endo/Other  negative endocrine ROS  Renal/GU negative Renal ROS  negative genitourinary   Musculoskeletal  (+) Arthritis ,   Abdominal   Peds  Hematology negative hematology ROS (+) anemia ,   Anesthesia Other Findings Past Medical History:   Hypertension                                                 Anxiety                                                      Depression                                                   GERD (gastroesophageal reflux disease)                       Hypercholesterolemia                                         Chronic insomnia                                             COPD (chronic obstructive pulmonary disease) (*                Comment:documented in outside records   Arthritis                                                    Heart murmur                                                 Osteoporosis  Shortness of breath                                            Comment:with exertion   Tingling                                                       Comment:both hands  History of stomach ulcers                                    Hyperparathyroidism, primary ([place])                           Dysrhythmia                                                    Comment:PALPITATIONS    Complication of anesthesia                                     Comment:"AFFECTS MY MEMORY"   Burning feet syndrome                                        Neuropathy (HCC)                                             Anemia                                                       HOH (hard of hearing)                                          Comment:AIDS   Scoliosis                                                    Palpitations                                                 Edema  Comment:MILD ANKLE  Past Surgical History:   DILATION AND CURETTAGE OF UTERUS                                Comment:X2    PITUITARY SURGERY                                               Comment:FOR TUMOR 3 YRS    JOINT REPLACEMENT                                               Comment:RT HIP  2013  (PARTIAL)    BREAST SURGERY                                                  Comment:RT BREAST CYST REMOVED 15 YRS AGO    ANTERIOR CERVICAL DECOMP/DISCECTOMY FUSION       10/28/2011      Comment:Procedure: ANTERIOR CERVICAL               DECOMPRESSION/DISCECTOMY FUSION 2 LEVELS;                Surgeon: Elaina Hoops, MD;  Location: [facility]               [facility];  Service: Neurosurgery;  Laterality: N/A;   PARATHYROIDECTOMY                               Left 08/25/2012      Comment:Procedure: PARATHYROIDECTOMY;  Surgeon: Earnstine Regal, MD;  Location: [facility];  Service:               General;  Laterality: Left;   TOTAL KNEE ARTHROPLASTY                         Right 07/18/2014       Comment:Procedure: TOTAL KNEE ARTHROPLASTY;  Surgeon:               Dereck Leep, MD;  Location: [facility] ORS;                Service: Orthopedics;  Laterality: Right;    BREAST BIOPSY                                   Right 1992           Comment:neg   CATARACT EXTRACTION W/PHACO                     Right 10/30/2014      Comment:Procedure: CATARACT EXTRACTION PHACO AND               INTRAOCULAR LENS PLACEMENT (IOC);  Surgeon:               Birder Robson, MD;  Location: [facility] ORS;                Service: Ophthalmology;  Laterality: Right;                US0:40 AP18.2 CDE7.91  [place]               #[id number]  BMI    Body Mass Index   26.64 kg/m 2      Reproductive/Obstetrics negative OB ROS                             Anesthesia Physical  Anesthesia Plan  ASA: III  Anesthesia Plan: MAC   Post-op Pain Management:    Induction: Intravenous  Airway Management Planned: Nasal Cannula  Additional Equipment:   Intra-op Plan:   Post-operative Plan:   Informed Consent: I have reviewed the patients History and Physical, chart, labs and discussed the procedure including the risks, benefits and alternatives for the proposed anesthesia with the patient or authorized representative who has indicated his/her understanding and acceptance.   Dental Advisory Given  Plan Discussed with: CRNA  Anesthesia Plan Comments:         Anesthesia Quick Evaluation

## 2014-12-13 ENCOUNTER — Other Ambulatory Visit (INDEPENDENT_AMBULATORY_CARE_PROVIDER_SITE_OTHER): Payer: Medicare Other

## 2014-12-13 ENCOUNTER — Other Ambulatory Visit: Payer: Self-pay | Admitting: Internal Medicine

## 2014-12-13 DIAGNOSIS — E7800 Pure hypercholesterolemia, unspecified: Secondary | ICD-10-CM | POA: Diagnosis not present

## 2014-12-13 DIAGNOSIS — I10 Essential (primary) hypertension: Secondary | ICD-10-CM

## 2014-12-13 LAB — LIPID PANEL
CHOL/HDL RATIO: 3
CHOLESTEROL: 197 mg/dL (ref 0–200)
HDL: 58.7 mg/dL (ref >39.00)
LDL CALC: 109 mg/dL — AB (ref 0–99)
NonHDL: 138.4
TRIGLYCERIDES: 146 mg/dL (ref 0.0–149.0)
VLDL: 29.2 mg/dL (ref 0.0–40.0)

## 2014-12-13 LAB — BASIC METABOLIC PANEL
BUN: 18 mg/dL (ref 6–23)
CHLORIDE: 102 meq/L (ref 96–112)
CO2: 32 mEq/L (ref 19–32)
Calcium: 10 mg/dL (ref 8.4–10.5)
Creatinine, Ser: 0.75 mg/dL (ref 0.40–1.20)
GFR: 81.74 mL/min (ref >60.00)
GLUCOSE: 104 mg/dL — AB (ref 70–99)
POTASSIUM: 4 meq/L (ref 3.5–5.1)
SODIUM: 143 meq/L (ref 135–145)

## 2014-12-13 LAB — HEPATIC FUNCTION PANEL
ALT: 23 U/L (ref 0–35)
AST: 20 U/L (ref 0–37)
Albumin: 4.3 g/dL (ref 3.5–5.2)
Alkaline Phosphatase: 87 U/L (ref 39–117)
Bilirubin, Direct: 0.1 mg/dL (ref 0.0–0.3)
TOTAL PROTEIN: 7.3 g/dL (ref 6.0–8.3)
Total Bilirubin: 0.8 mg/dL (ref 0.2–1.2)

## 2014-12-14 ENCOUNTER — Other Ambulatory Visit: Payer: Self-pay

## 2014-12-14 DIAGNOSIS — Z760 Encounter for issue of repeat prescription: Secondary | ICD-10-CM

## 2014-12-14 MED ORDER — PANTOPRAZOLE SODIUM 40 MG PO TBEC: 40.0000 mg | DELAYED_RELEASE_TABLET | Freq: Two times a day (BID) | ORAL | Status: DC

## 2014-12-17 ENCOUNTER — Ambulatory Visit (INDEPENDENT_AMBULATORY_CARE_PROVIDER_SITE_OTHER): Payer: Medicare Other | Admitting: Internal Medicine

## 2014-12-17 ENCOUNTER — Encounter: Payer: Self-pay | Admitting: Internal Medicine

## 2014-12-17 VITALS — BP 110/80 | HR 69 | Temp 97.9°F | Resp 18 | Ht 66.0 in | Wt 166.0 lb

## 2014-12-17 DIAGNOSIS — G629 Polyneuropathy, unspecified: Secondary | ICD-10-CM

## 2014-12-17 DIAGNOSIS — I10 Essential (primary) hypertension: Secondary | ICD-10-CM

## 2014-12-17 DIAGNOSIS — M5136 Other intervertebral disc degeneration, lumbar region: Secondary | ICD-10-CM | POA: Insufficient documentation

## 2014-12-17 DIAGNOSIS — M51369 Other intervertebral disc degeneration, lumbar region without mention of lumbar back pain or lower extremity pain: Secondary | ICD-10-CM

## 2014-12-17 DIAGNOSIS — Z23 Encounter for immunization: Secondary | ICD-10-CM | POA: Diagnosis not present

## 2014-12-17 DIAGNOSIS — E7800 Pure hypercholesterolemia, unspecified: Secondary | ICD-10-CM

## 2014-12-17 DIAGNOSIS — Z860100 Personal history of colon polyps, unspecified: Secondary | ICD-10-CM

## 2014-12-17 DIAGNOSIS — E210 Primary hyperparathyroidism: Secondary | ICD-10-CM | POA: Diagnosis not present

## 2014-12-17 DIAGNOSIS — F419 Anxiety disorder, unspecified: Secondary | ICD-10-CM

## 2014-12-17 DIAGNOSIS — R739 Hyperglycemia, unspecified: Secondary | ICD-10-CM

## 2014-12-17 DIAGNOSIS — F329 Major depressive disorder, single episode, unspecified: Secondary | ICD-10-CM

## 2014-12-17 DIAGNOSIS — F32A Depression, unspecified: Secondary | ICD-10-CM

## 2014-12-17 DIAGNOSIS — M25561 Pain in right knee: Secondary | ICD-10-CM

## 2014-12-17 NOTE — Assessment & Plan Note (Signed)
Low cholesterol diet and exercise.  Follow lipid panel and liver function tests.  On lipitor.  Follow.

## 2014-12-17 NOTE — Progress Notes (Signed)
Patient ID: [name], female   DOB: 1945-05-12, 69 y.o.   MRN: [id number]   Subjective:    Patient ID: [name], female    DOB: 1945/12/01, 69 y.o.   MRN: [id number]  HPI  Patient with past history of hypertension, anxiety, depression, GERD and hypercholesterolemia.  She comes in today to follow up on these issues.  Recently saw Dr Manuella Ghazi.  See his note for details.  Diagnosed with DDD and peripheral neuropathy.  Was initially placed on effexor.  Did not tolerate.  Now on lyrica.  Feels this is helping.  States that since starting the medication, she may feel a little unsteady.  We discussed decreasing the lyrica dose.  She has done well with her knee surgery.  Some aching in her knee.  Discussed therapy and exercise.  She is scheduled to see Dr Weston Settle for her back.  She is riding her bike.  Reports no chest pain or tightness with increased activity or exertion.  Breathing stable.  No abdominal pain or cramping. Bowels stable.  Increased stress and some depression.  Discussed with her today.  She declines to start zoloft, prozac, etc  Had intolerance to effexor.     Past Medical History  Diagnosis Date  . Hypertension   . Anxiety   . Depression   . GERD (gastroesophageal reflux disease)   . Hypercholesterolemia   . Chronic insomnia   . COPD (chronic obstructive pulmonary disease) ([place])     documented in outside records  . Arthritis   . Heart murmur   . Osteoporosis   . Shortness of breath     with exertion  . Tingling     both hands  . History of stomach ulcers   . Hyperparathyroidism, primary ([place])   . Dysrhythmia     PALPITATIONS   . Complication of anesthesia     "AFFECTS MY MEMORY"  . Burning feet syndrome   . Neuropathy ([place])   . Anemia   . HOH (hard of hearing)     AIDS  . Scoliosis   . Palpitations   . Edema     MILD ANKLE   Past Surgical History  Procedure Laterality Date  . Dilation and curettage of uterus      X2   . Pituitary surgery      FOR TUMOR  3 YRS   . Joint replacement      RT HIP  2013  (PARTIAL)   . Breast surgery      RT BREAST CYST REMOVED 15 YRS AGO   . Anterior cervical decomp/discectomy fusion  10/28/2011    Procedure: ANTERIOR CERVICAL DECOMPRESSION/DISCECTOMY FUSION 2 LEVELS;  Surgeon: Elaina Hoops, MD;  Location: [facility];  Service: Neurosurgery;  Laterality: N/A;  . Parathyroidectomy Left 08/25/2012    Procedure: PARATHYROIDECTOMY;  Surgeon: Earnstine Regal, MD;  Location: [facility];  Service: General;  Laterality: Left;  . Total knee arthroplasty Right 07/18/2014    Procedure: TOTAL KNEE ARTHROPLASTY;  Surgeon: Dereck Leep, MD;  Location: [facility] ORS;  Service: Orthopedics;  Laterality: Right;  . Breast biopsy Right 1992    neg  . Cataract extraction w/phaco Right 10/30/2014    Procedure: CATARACT EXTRACTION PHACO AND INTRAOCULAR LENS PLACEMENT (IOC);  Surgeon: Birder Robson, MD;  Location: [facility] ORS;  Service: Ophthalmology;  Laterality: Right;  US0:40 AP18.2 CDE7.42  [place] #[id number]  . Cataract extraction w/phaco Left 11/27/2014    Procedure: CATARACT EXTRACTION PHACO AND INTRAOCULAR LENS  PLACEMENT (IOC);  Surgeon: Birder Robson, MD;  Location: [facility] ORS;  Service: Ophthalmology;  Laterality: Left;  [place] 00:36.9 AP%: 18.9 CDE: 6.96  [place] #  [id number]   Family History  Problem Relation Age of Onset  . Glaucoma Mother   . Hypertension Mother   . Thyroid disease Mother   . Stroke Father   . Hypertension Father    Social History   Social History  . Marital Status: Divorced    Spouse Name: N/A  . Number of Children: N/A  . Years of Education: N/A   Social History Main Topics  . Smoking status: Former Smoker    Quit date: 02/10/2008  . Smokeless tobacco: Never Used  . Alcohol Use: No  . Drug Use: No  . Sexual Activity: Not Asked   Other Topics Concern  . None   Social History Narrative    Outpatient Encounter Prescriptions as of 12/17/2014  Medication Sig  . acetaminophen (TYLENOL)  500 MG tablet Take 1,000 mg by mouth every 6 (six) hours as needed for fever.   Marland Kitchen amLODipine (NORVASC) 5 MG tablet TAKE 1 TABLET (5 MG TOTAL) BY MOUTH DAILY.  Marland Kitchen atorvastatin (LIPITOR) 40 MG tablet TAKE 1 TABLET BY MOUTH ONCE DAILY  . Biotin (BIOTIN MAXIMUM STRENGTH) 10 MG TABS Take 1 tablet by mouth daily.  . Carboxymethylcellulose Sodium (REFRESH TEARS OP) Apply 1 drop to eye as needed (for dry eyes).  . chlorthalidone (HYGROTON) 25 MG tablet TAKE 1 TABLET (25 MG TOTAL) BY MOUTH DAILY.  . cholecalciferol (VITAMIN D) 1000 UNITS tablet Take 1,000 Units by mouth daily.   Marland Kitchen docusate sodium (COLACE) 100 MG capsule Take 100 mg by mouth 2 (two) times daily as needed for mild constipation.   . ferrous sulfate 325 (65 FE) MG tablet Take 325 mg by mouth daily.  Marland Kitchen gabapentin (NEURONTIN) 100 MG capsule Take 100 mg by mouth at bedtime. 1 to 2 as needed  . KLOR-CON M10 10 MEQ tablet TAKE 1 TABLET BY MOUTH DAILY.  Marland Kitchen LYRICA 75 MG capsule Take 75 mg by mouth 2 (two) times daily.  . magnesium oxide (MAG-OX) 400 MG tablet Take 400 mg by mouth daily.  . Multiple Vitamins-Minerals (MULTIVITAMIN PO) Take 1 tablet by mouth daily.  . Omega-3 Fatty Acids (OMEGA 3 PO) Take 1 capsule by mouth 3 (three) times a week.   . pantoprazole (PROTONIX) 40 MG tablet Take 1 tablet (40 mg total) by mouth 2 (two) times daily.  . valsartan (DIOVAN) 320 MG tablet TAKE 1 TABLET (320 MG TOTAL) BY MOUTH DAILY.   No facility-administered encounter medications on file as of 12/17/2014.    Review of Systems  Constitutional: Negative for appetite change and unexpected weight change.  HENT: Negative for congestion and sinus pressure.   Eyes: Negative for pain and discharge.  Respiratory: Negative for cough, chest tightness and shortness of breath.   Cardiovascular: Negative for chest pain, palpitations and leg swelling.  Gastrointestinal: Negative for nausea, vomiting, abdominal pain and diarrhea.  Genitourinary: Negative for dysuria and  difficulty urinating.  Musculoskeletal: Positive for back pain.       Right knee pain/aching.    Skin: Negative for color change and rash.  Neurological: Negative for dizziness, light-headedness and headaches.  Psychiatric/Behavioral: Negative for agitation.       Increased stress.  Feels down at times.  No significant depression.  No suicidal ideations.         Objective:     Blood pressure  rechecked by me:  128/82  Physical Exam  Constitutional: She appears well-developed and well-nourished. No distress.  HENT:  Nose: Nose normal.  Mouth/Throat: Oropharynx is clear and moist.  Eyes: Conjunctivae are normal. Right eye exhibits no discharge. Left eye exhibits no discharge.  Neck: Neck supple. No thyromegaly present.  Cardiovascular: Normal rate and regular rhythm.   Pulmonary/Chest: Breath sounds normal. No respiratory distress. She has no wheezes.  Abdominal: Soft. Bowel sounds are normal. There is no tenderness.  Musculoskeletal: She exhibits no edema or tenderness.  Welled healed incision site - right knee.    Lymphadenopathy:    She has no cervical adenopathy.  Skin: No rash noted. No erythema.  Psychiatric: She has a normal mood and affect. Her behavior is normal.    BP 110/80 mmHg  Pulse 69  Temp(Src) 97.9 F (36.6 C) (Oral)  Resp 18  Ht 5\' 6"  (1.676 m)  Wt 166 lb (75.297 kg)  BMI 26.81 kg/m2  SpO2 95% Wt Readings from Last 3 Encounters:  12/17/14 166 lb (75.297 kg)  11/21/14 165 lb (74.844 kg)  10/24/14 165 lb (74.844 kg)     Lab Results  Component Value Date   WBC 7.5 09/18/2014   HGB 13.3 09/18/2014   HCT 40.1 09/18/2014   PLT 324.0 09/18/2014   GLUCOSE 104* 12/13/2014   CHOL 197 12/13/2014   TRIG 146.0 12/13/2014   HDL 58.70 12/13/2014   LDLCALC 109* 12/13/2014   ALT 23 12/13/2014   AST 20 12/13/2014   NA 143 12/13/2014   K 4.0 12/13/2014   CL 102 12/13/2014   CREATININE 0.75 12/13/2014   BUN 18 12/13/2014   CO2 32 12/13/2014   TSH 0.86  09/18/2014   INR 0.91 07/05/2014   HGBA1C 5.6 09/18/2014       Assessment & Plan:   Problem List Items Addressed This Visit    Anxiety    No longer seeing psychiatry.  Discussed with her today.  She desires to hold on starting SSRI.  Follow.        DDD (degenerative disc disease), lumbar    Due to see Dr Weston Settle.  Follow.        Depression    No longer seeing psychiatry.  No suicidal ideations.  Desires not to start SSRI.  Follow.        History of colonic polyps    Colonoscopy 09/15/13 as outlined in overview.  Recommended f/u colonoscopy in 09/2023.        Hypercholesterolemia    Low cholesterol diet and exercise.  Follow lipid panel and liver function tests.  On lipitor.  Follow.        Relevant Orders   Lipid panel   Hepatic function panel   Hyperparathyroidism, primary [facility])    S/p parathyroid surgery.  Followed by Dr Harlow Asa.  Recent calcium wnl.        Hypertension    Blood pressure under good control.  Continue same medication regimen.  Follow pressures.  Follow metabolic panel.        Relevant Orders   Basic metabolic panel   Neuropathy ([place])    Taking lyrica.  Seeing Dr Manuella Ghazi.  Will d/w him regarding the possibility of decreasing the dose to 50mg  bid.  Did not tolerate effexor.  Follow.        Right knee pain    Some persistent aching s/p surgery.  Is better.  Walking and exercise help.  Follow.  Other Visit Diagnoses    Encounter for immunization    -  Primary    Hyperglycemia        Relevant Orders    Hemoglobin A1c        Einar Pheasant, MD

## 2014-12-17 NOTE — Assessment & Plan Note (Signed)
Due to see Dr Weston Settle.  Follow.

## 2014-12-17 NOTE — Assessment & Plan Note (Signed)
S/p parathyroid surgery.  Followed by Dr Harlow Asa.  Recent calcium wnl.

## 2014-12-17 NOTE — Assessment & Plan Note (Signed)
No longer seeing psychiatry.  Discussed with her today.  She desires to hold on starting SSRI.  Follow.

## 2014-12-17 NOTE — Patient Instructions (Signed)

## 2014-12-17 NOTE — Assessment & Plan Note (Signed)
Colonoscopy 09/15/13 as outlined in overview.  Recommended f/u colonoscopy in 09/2023.

## 2014-12-17 NOTE — Assessment & Plan Note (Signed)
Blood pressure under good control.  Continue same medication regimen.  Follow pressures.  Follow metabolic panel.   

## 2014-12-17 NOTE — Assessment & Plan Note (Signed)
No longer seeing psychiatry.  No suicidal ideations.  Desires not to start SSRI.  Follow.

## 2014-12-17 NOTE — Assessment & Plan Note (Signed)
Taking lyrica.  Seeing Dr Manuella Ghazi.  Will d/w him regarding the possibility of decreasing the dose to 50mg  bid.  Did not tolerate effexor.  Follow.

## 2014-12-17 NOTE — Assessment & Plan Note (Signed)
Some persistent aching s/p surgery.  Is better.  Walking and exercise help.  Follow.

## 2014-12-24 ENCOUNTER — Telehealth: Payer: Self-pay

## 2014-12-24 NOTE — Telephone Encounter (Signed)
The medications I had mentioned to her, she did not want to start.  She was going to call me with the name of a medication she was interested in knowing more about.  Does she have the name?

## 2014-12-24 NOTE — Telephone Encounter (Signed)
Tentrillix is the name of medication. She also said you were going to talk to Dr. Manuella Ghazi about cutting the medication in half to see if that helps with her dizziness.

## 2014-12-24 NOTE — Telephone Encounter (Signed)
I received a phone message from patient. She states that she had spoke with [place] - Patient states that Dr. Nicki Reaper & her had talked about starting a medication for her nerves.

## 2014-12-25 NOTE — Telephone Encounter (Signed)
I recommend a f/u with Dr Manuella Ghazi for her to discuss with him regarding her issues with dizziness/unsteadiness to see if he feels related to the medication.  If she is agreeable for [place] to schedule an appt, let me know.  Regarding the medication, she has seen psych previously.  If she is wanting to start a new medication (given the issues she has had with previous medications), my recommendation would be a f/u appt with Psych to discuss.  This would be the best way to treat her.

## 2014-12-28 NOTE — Telephone Encounter (Signed)
Pt is scheduled to start a serious of shots in her back at [facility] next week. Dr. Manuella Ghazi has decreased her Dilaudid to 50mg , & she will get in touch with her psych doctor about trying the new medication

## 2015-01-09 ENCOUNTER — Other Ambulatory Visit: Payer: Self-pay

## 2015-01-09 MED ORDER — ATORVASTATIN CALCIUM 40 MG PO TABS: 40.0000 mg | ORAL_TABLET | Freq: Every day | ORAL | Status: DC

## 2015-02-14 ENCOUNTER — Other Ambulatory Visit: Payer: Self-pay | Admitting: Internal Medicine

## 2015-03-07 ENCOUNTER — Other Ambulatory Visit: Payer: Self-pay | Admitting: Internal Medicine

## 2015-03-07 ENCOUNTER — Telehealth: Payer: Self-pay

## 2015-03-07 NOTE — Telephone Encounter (Signed)
Pt states she has a balloon type organ (maybe bladder) sticking out of her vagina and wants a referral for gyn. Pt c/o bladder dropping more and pressure, and back is hurting. Please Advise.

## 2015-03-08 ENCOUNTER — Encounter: Payer: Self-pay | Admitting: Internal Medicine

## 2015-03-08 ENCOUNTER — Ambulatory Visit (INDEPENDENT_AMBULATORY_CARE_PROVIDER_SITE_OTHER): Payer: Medicare Other | Admitting: Internal Medicine

## 2015-03-08 VITALS — BP 120/80 | HR 61 | Temp 97.5°F | Resp 18 | Ht 66.0 in | Wt 167.4 lb

## 2015-03-08 DIAGNOSIS — N8110 Cystocele, unspecified: Secondary | ICD-10-CM | POA: Diagnosis not present

## 2015-03-08 DIAGNOSIS — F419 Anxiety disorder, unspecified: Secondary | ICD-10-CM | POA: Diagnosis not present

## 2015-03-08 DIAGNOSIS — R42 Dizziness and giddiness: Secondary | ICD-10-CM

## 2015-03-08 DIAGNOSIS — I10 Essential (primary) hypertension: Secondary | ICD-10-CM

## 2015-03-08 DIAGNOSIS — G629 Polyneuropathy, unspecified: Secondary | ICD-10-CM

## 2015-03-08 NOTE — Assessment & Plan Note (Signed)
Exam as outlined.  Refer to GYN for evaluation.

## 2015-03-08 NOTE — Progress Notes (Signed)
Pre-visit discussion using our clinic review tool. No additional management support is needed unless otherwise documented below in the visit note.  

## 2015-03-08 NOTE — Telephone Encounter (Signed)
I know this is short notice, but please call pt and see if she can come in at 9:00 this am for evaluation.  If no, just let me know.

## 2015-03-08 NOTE — Telephone Encounter (Signed)
Pt called and scheduled for 9am 1/27.Marland Kitchen

## 2015-03-08 NOTE — Progress Notes (Signed)
Patient ID: [name], female   DOB: 06/27/1945, 70 y.o.   MRN: [id number]   Subjective:    Patient ID: [name], female    DOB: [birth year], 70 y.o.   MRN: [id number]  HPI  Patient with past history of hypercholesterolemia, GERD, anxiety and hypertension.  She comes in today as a work in with concerns regarding a "vaginal bulge".  She reports noticing a balloon like protrusion from the vaginal opening.  Some pressure.  No abdominal pain.  No dysuria.  Is bothering her.  Would like further evaluation.  Also while here, she reports noticing being light headed.  Has been taking both gabapentin and lyrica.  Just recently stopped the gabapentin - just a few days ago.  States the light headedness is a little better.  She was questioning if medication contributing to her symptoms.  Dr Manuella Ghazi decreased her lyrica to 50mg  bid instead of 75mg  bid to see if would help.  She is occasionally taking two lyrical before bed.  Discussed only taking 50mg  bid.  No headache.  No chest pain.  No increased sob.  Bowels stable.  Blood pressure doing better.     Past Medical History  Diagnosis Date  . Hypertension   . Anxiety   . Depression   . GERD (gastroesophageal reflux disease)   . Hypercholesterolemia   . Chronic insomnia   . COPD (chronic obstructive pulmonary disease) ([place])     documented in outside records  . Arthritis   . Heart murmur   . Osteoporosis   . Shortness of breath     with exertion  . Tingling     both hands  . History of stomach ulcers   . Hyperparathyroidism, primary ([place])   . Dysrhythmia     PALPITATIONS   . Complication of anesthesia     "AFFECTS MY MEMORY"  . Burning feet syndrome   . Neuropathy ([place])   . Anemia   . HOH (hard of hearing)     AIDS  . Scoliosis   . Palpitations   . Edema     MILD ANKLE   Past Surgical History  Procedure Laterality Date  . Dilation and curettage of uterus      X2   . Pituitary surgery      FOR TUMOR 3 YRS   . Joint  replacement      RT HIP  2013  (PARTIAL)   . Breast surgery      RT BREAST CYST REMOVED 15 YRS AGO   . Anterior cervical decomp/discectomy fusion  10/28/2011    Procedure: ANTERIOR CERVICAL DECOMPRESSION/DISCECTOMY FUSION 2 LEVELS;  Surgeon: Elaina Hoops, MD;  Location: [facility];  Service: Neurosurgery;  Laterality: N/A;  . Parathyroidectomy Left 08/25/2012    Procedure: PARATHYROIDECTOMY;  Surgeon: Earnstine Regal, MD;  Location: [facility];  Service: General;  Laterality: Left;  . Total knee arthroplasty Right 07/18/2014    Procedure: TOTAL KNEE ARTHROPLASTY;  Surgeon: Dereck Leep, MD;  Location: [facility];  Service: Orthopedics;  Laterality: Right;  . Breast biopsy Right 1992    neg  . Cataract extraction w/phaco Right 10/30/2014    Procedure: CATARACT EXTRACTION PHACO AND INTRAOCULAR LENS PLACEMENT (IOC);  Surgeon: Birder Robson, MD;  Location: [facility];  Service: Ophthalmology;  Laterality: Right;  US0:40 AP18.2 CDE7.42  [place] [id number] h  . Cataract extraction w/phaco Left 11/27/2014    Procedure: CATARACT EXTRACTION PHACO AND INTRAOCULAR LENS PLACEMENT (IOC);  Surgeon: Birder Robson,  MD;  Location: [facility];  Service: Ophthalmology;  Laterality: Left;  [place] 00:36.9 AP%: 18.9 CDE: 6.96  casette lot #  [id number] H   Family History  Problem Relation Age of Onset  . Glaucoma Mother   . Hypertension Mother   . Thyroid disease Mother   . Stroke Father   . Hypertension Father    Social History   Social History  . Marital Status: Divorced    Spouse Name: N/A  . Number of Children: N/A  . Years of Education: N/A   Social History Main Topics  . Smoking status: Former Smoker    Quit date: 02/10/2008  . Smokeless tobacco: Never Used  . Alcohol Use: No  . Drug Use: No  . Sexual Activity: Not Asked   Other Topics Concern  . None   Social History Narrative    Outpatient Encounter Prescriptions as of 03/08/2015  Medication Sig  . acetaminophen (TYLENOL) 500 MG tablet  Take 1,000 mg by mouth every 6 (six) hours as needed for fever.   Marland Kitchen amLODipine (NORVASC) 5 MG tablet TAKE 1 TABLET (5 MG TOTAL) BY MOUTH DAILY.  Marland Kitchen atorvastatin (LIPITOR) 40 MG tablet Take 1 tablet (40 mg total) by mouth daily.  . Biotin (BIOTIN MAXIMUM STRENGTH) 10 MG TABS Take 1 tablet by mouth daily.  . Carboxymethylcellulose Sodium (REFRESH TEARS OP) Apply 1 drop to eye as needed (for dry eyes).  . chlorthalidone (HYGROTON) 25 MG tablet TAKE 1 TABLET (25 MG TOTAL) BY MOUTH DAILY.  . cholecalciferol (VITAMIN D) 1000 UNITS tablet Take 1,000 Units by mouth daily.   Marland Kitchen docusate sodium (COLACE) 100 MG capsule Take 100 mg by mouth 2 (two) times daily as needed for mild constipation.   . ferrous sulfate 325 (65 FE) MG tablet Take 325 mg by mouth daily.  Marland Kitchen KLOR-CON M10 10 MEQ tablet TAKE 1 TABLET BY MOUTH DAILY.  Marland Kitchen LYRICA 75 MG capsule Take 75 mg by mouth 2 (two) times daily.  . magnesium oxide (MAG-OX) 400 MG tablet Take 400 mg by mouth daily.  . Multiple Vitamins-Minerals (MULTIVITAMIN PO) Take 1 tablet by mouth daily.  . Omega-3 Fatty Acids (OMEGA 3 PO) Take 1 capsule by mouth 3 (three) times a week.   . pantoprazole (PROTONIX) 40 MG tablet Take 1 tablet (40 mg total) by mouth 2 (two) times daily.  . valsartan (DIOVAN) 320 MG tablet TAKE 1 TABLET (320 MG TOTAL) BY MOUTH DAILY.  . [DISCONTINUED] gabapentin (NEURONTIN) 100 MG capsule Take 100 mg by mouth at bedtime. 1 to 2 as needed   No facility-administered encounter medications on file as of 03/08/2015.    Review of Systems  Constitutional: Negative for appetite change and unexpected weight change.  HENT: Negative for congestion and sinus pressure.   Eyes: Negative for discharge and redness.  Respiratory: Negative for cough, chest tightness and shortness of breath.   Cardiovascular: Negative for chest pain, palpitations and leg swelling (swelling much improved.).  Gastrointestinal: Negative for nausea, vomiting, abdominal pain and diarrhea.    Genitourinary: Negative for dysuria.       Vaginal bulge as outlined.  Some pressure.    Musculoskeletal: Negative for back pain and joint swelling.  Skin: Negative for color change and rash.  Neurological: Positive for light-headedness. Negative for headaches.  Psychiatric/Behavioral: Negative for dysphoric mood and agitation.       Some sleep issues.         Objective:    Physical Exam  Constitutional: She appears well-developed  and well-nourished. No distress.  HENT:  Nose: Nose normal.  Mouth/Throat: Oropharynx is clear and moist.  Eyes: Conjunctivae are normal. Right eye exhibits no discharge. Left eye exhibits no discharge.  Neck: Neck supple. No thyromegaly present.  Cardiovascular: Normal rate and regular rhythm.   Pulmonary/Chest: Breath sounds normal. No respiratory distress. She has no wheezes.  Abdominal: Soft. Bowel sounds are normal. There is no tenderness.  Genitourinary:  Normal external genitalia.  Vaginal vault without lesions.  Vaginal prolapse present.  Extends past the vaginal opening, especially with bearing down.  Could not appreciate any adnexal masses or tenderness.    Musculoskeletal: She exhibits no edema or tenderness.  Lymphadenopathy:    She has no cervical adenopathy.  Skin: No rash noted. No erythema.  Psychiatric: She has a normal mood and affect. Her behavior is normal.    BP 120/80 mmHg  Pulse 61  Temp(Src) 97.5 F (36.4 C) (Oral)  Resp 18  Ht 5\' 6"  (1.676 m)  Wt 167 lb 6 oz (75.921 kg)  BMI 27.03 kg/m2  SpO2 97% Wt Readings from Last 3 Encounters:  03/08/15 167 lb 6 oz (75.921 kg)  12/17/14 166 lb (75.297 kg)  11/21/14 165 lb (74.844 kg)     Lab Results  Component Value Date   WBC 7.5 09/18/2014   HGB 13.3 09/18/2014   HCT 40.1 09/18/2014   PLT 324.0 09/18/2014   GLUCOSE 104* 12/13/2014   CHOL 197 12/13/2014   TRIG 146.0 12/13/2014   HDL 58.70 12/13/2014   LDLCALC 109* 12/13/2014   ALT 23 12/13/2014   AST 20 12/13/2014    NA 143 12/13/2014   K 4.0 12/13/2014   CL 102 12/13/2014   CREATININE 0.75 12/13/2014   BUN 18 12/13/2014   CO2 32 12/13/2014   TSH 0.86 09/18/2014   INR 0.91 07/05/2014   HGBA1C 5.6 09/18/2014       Assessment & Plan:   Problem List Items Addressed This Visit    Anxiety    No longer seeing psychiatry.  Stable.  Follow.        Hypertension    Blood pressure under good control.  Continue same medication regimen.  Follow pressures.  Follow metabolic panel.        Light headedness    Blood pressure is better.  Just stopped gabapentin.  Was taking both gabapentin and lyrica.  Is some better.  No increased congestion.  Have her stop additional doses of lyrica.  May be contributing.  Follow.  Hold on further testing.  Get her back in soon to reassess.        Neuropathy ([place])    Taking lyrica.  Dr Manuella Ghazi decreased dose.  She recently stopped taking gabapentin.  Was taking both.  Has been taking extra doses of lyrica.  Discussed only taking 50mg  bid lyrica.  Follow.  Keep f/u with Dr Manuella Ghazi.       Vaginal prolapse - Primary    Exam as outlined.  Refer to GYN for evaluation.        Relevant Orders   Ambulatory referral to Gynecology       Einar Pheasant, MD

## 2015-03-10 ENCOUNTER — Encounter: Payer: Self-pay | Admitting: Internal Medicine

## 2015-03-10 DIAGNOSIS — R42 Dizziness and giddiness: Secondary | ICD-10-CM | POA: Insufficient documentation

## 2015-03-10 NOTE — Assessment & Plan Note (Signed)
Blood pressure under good control.  Continue same medication regimen.  Follow pressures.  Follow metabolic panel.   

## 2015-03-10 NOTE — Assessment & Plan Note (Signed)
No longer seeing psychiatry.  Stable.  Follow.

## 2015-03-10 NOTE — Assessment & Plan Note (Signed)
Blood pressure is better.  Just stopped gabapentin.  Was taking both gabapentin and lyrica.  Is some better.  No increased congestion.  Have her stop additional doses of lyrica.  May be contributing.  Follow.  Hold on further testing.  Get her back in soon to reassess.

## 2015-03-10 NOTE — Assessment & Plan Note (Signed)
Taking lyrica.  Dr Manuella Ghazi decreased dose.  She recently stopped taking gabapentin.  Was taking both.  Has been taking extra doses of lyrica.  Discussed only taking 50mg  bid lyrica.  Follow.  Keep f/u with Dr Manuella Ghazi.

## 2015-03-25 ENCOUNTER — Other Ambulatory Visit: Payer: Self-pay

## 2015-03-25 MED ORDER — VALSARTAN 320 MG PO TABS
ORAL_TABLET | ORAL | Status: DC
Start: 2015-03-25 — End: 2015-06-24

## 2015-03-27 ENCOUNTER — Ambulatory Visit (INDEPENDENT_AMBULATORY_CARE_PROVIDER_SITE_OTHER): Payer: Medicare Other | Admitting: Obstetrics and Gynecology

## 2015-03-27 ENCOUNTER — Encounter: Payer: Self-pay | Admitting: Obstetrics and Gynecology

## 2015-03-27 VITALS — BP 132/79 | HR 74 | Ht 66.0 in | Wt 167.5 lb

## 2015-03-27 DIAGNOSIS — N816 Rectocele: Secondary | ICD-10-CM | POA: Diagnosis not present

## 2015-03-27 DIAGNOSIS — N8110 Cystocele, unspecified: Secondary | ICD-10-CM

## 2015-03-27 DIAGNOSIS — N952 Postmenopausal atrophic vaginitis: Secondary | ICD-10-CM | POA: Diagnosis not present

## 2015-03-27 DIAGNOSIS — IMO0002 Reserved for concepts with insufficient information to code with codable children: Secondary | ICD-10-CM | POA: Insufficient documentation

## 2015-03-27 DIAGNOSIS — N813 Complete uterovaginal prolapse: Secondary | ICD-10-CM | POA: Diagnosis not present

## 2015-03-27 NOTE — Patient Instructions (Signed)
1.  Use Estrace cream, intravaginal every night for 4 weeks; then use the cream twice a week. 2.  Options of management of pelvic organ prolapse were reviewed.  Plan at this time is to proceed with TVH, BSO with anterior colporrhaphy in approximately 6 weeks. 3.  Return in 4 weeks for follow-up

## 2015-03-27 NOTE — Progress Notes (Signed)
GYN ENCOUNTER NOTE  Subjective:       [name] is a 70 y.o. G25P1001 female is here for gynecologic evaluation of the following issues:  1. Pelvic organ prolapse.      Patient is a 70 year old divorced white female, para 1, 001, menopausal, on no hormone replacement therapy, who presents in referral from Dr. Einar Pheasant for evaluation of pelvic organ prolapse.  GU symptoms: Urinary frequency-4 per day. Nocturia-1 per night. No urgency. No stress urinary incontinence. History of occasional UTI with out any infection in the past several years.  GI symptoms: History of chronic constipation. Bowel function approximately every other day to every day. Patient does Juicing and Colace In order to be regular  Gynecologic History Menarche-age 59. Menstrual cycles-regular Route life. Flow-heavy No history of dysmenorrhea. Menopause age 77. History of HRT use until age 57 No history of abnormal Pap smears. No history of STI No LMP recorded. Patient is postmenopausal. Contraception: post menopausal status  Obstetric History OB History  Gravida Para Term Preterm AB SAB TAB Ectopic Multiple Living  1 1 1       1     # Outcome Date GA Lbr Len/2nd Weight Sex Delivery Anes PTL Lv  1 Term 1978   8 lb 2.1 oz (3.688 kg) M Vag-Spont   Y      Past Medical History  Diagnosis Date  . Hypertension   . Anxiety   . Depression   . GERD (gastroesophageal reflux disease)   . Hypercholesterolemia   . Chronic insomnia   . COPD (chronic obstructive pulmonary disease) ([place])     documented in outside records  . Arthritis   . Heart murmur   . Osteoporosis   . Shortness of breath     with exertion  . Tingling     both hands  . History of stomach ulcers   . Hyperparathyroidism, primary ([place])   . Dysrhythmia     PALPITATIONS   . Complication of anesthesia     "AFFECTS MY MEMORY"  . Burning feet syndrome   . Neuropathy ([place])   . Anemia   . HOH (hard of hearing)     AIDS  .  Scoliosis   . Palpitations   . Edema     MILD ANKLE    Past Surgical History  Procedure Laterality Date  . Dilation and curettage of uterus      X2   . Pituitary surgery      FOR TUMOR 3 YRS   . Joint replacement      RT HIP  2013  (PARTIAL)   . Breast surgery      RT BREAST CYST REMOVED 15 YRS AGO   . Anterior cervical decomp/discectomy fusion  10/28/2011    Procedure: ANTERIOR CERVICAL DECOMPRESSION/DISCECTOMY FUSION 2 LEVELS;  Surgeon: Elaina Hoops, MD;  Location: [facility];  Service: Neurosurgery;  Laterality: N/A;  . Parathyroidectomy Left 08/25/2012    Procedure: PARATHYROIDECTOMY;  Surgeon: Earnstine Regal, MD;  Location: [facility];  Service: General;  Laterality: Left;  . Total knee arthroplasty Right 07/18/2014    Procedure: TOTAL KNEE ARTHROPLASTY;  Surgeon: Dereck Leep, MD;  Location: [facility];  Service: Orthopedics;  Laterality: Right;  . Breast biopsy Right 1992    neg  . Cataract extraction w/phaco Right 10/30/2014    Procedure: CATARACT EXTRACTION PHACO AND INTRAOCULAR LENS PLACEMENT (IOC);  Surgeon: Birder Robson, MD;  Location: [facility];  Service: Ophthalmology;  Laterality: Right;  US0:40 AP18.2 CDE7.42  [place] lot [id number] h  . Cataract extraction w/phaco Left 11/27/2014    Procedure: CATARACT EXTRACTION PHACO AND INTRAOCULAR LENS PLACEMENT (IOC);  Surgeon: Birder Robson, MD;  Location: [facility];  Service: Ophthalmology;  Laterality: Left;  [place] 00:36.9 AP%: 18.9 CDE: 6.96  casette lot #  [id number] H    Current Outpatient Prescriptions on File Prior to Visit  Medication Sig Dispense Refill  . acetaminophen (TYLENOL) 500 MG tablet Take 1,000 mg by mouth every 6 (six) hours as needed for fever.     Marland Kitchen amLODipine (NORVASC) 5 MG tablet TAKE 1 TABLET (5 MG TOTAL) BY MOUTH DAILY. 90 tablet 1  . atorvastatin (LIPITOR) 40 MG tablet Take 1 tablet (40 mg total) by mouth daily. 30 tablet 5  . Biotin (BIOTIN MAXIMUM STRENGTH) 10 MG TABS Take 1 tablet by mouth daily.    .  Carboxymethylcellulose Sodium (REFRESH TEARS OP) Apply 1 drop to eye as needed (for dry eyes).    . chlorthalidone (HYGROTON) 25 MG tablet TAKE 1 TABLET (25 MG TOTAL) BY MOUTH DAILY. 90 tablet 1  . cholecalciferol (VITAMIN D) 1000 UNITS tablet Take 1,000 Units by mouth daily.     Marland Kitchen docusate sodium (COLACE) 100 MG capsule Take 100 mg by mouth 2 (two) times daily as needed for mild constipation.     . ferrous sulfate 325 (65 FE) MG tablet Take 325 mg by mouth daily.    Marland Kitchen KLOR-CON M10 10 MEQ tablet TAKE 1 TABLET BY MOUTH DAILY. 90 tablet 1  . magnesium oxide (MAG-OX) 400 MG tablet Take 400 mg by mouth daily.    . Multiple Vitamins-Minerals (MULTIVITAMIN PO) Take 1 tablet by mouth daily.    . Omega-3 Fatty Acids (OMEGA 3 PO) Take 1 capsule by mouth 3 (three) times a week.     . pantoprazole (PROTONIX) 40 MG tablet Take 1 tablet (40 mg total) by mouth 2 (two) times daily. 180 tablet 1  . valsartan (DIOVAN) 320 MG tablet TAKE 1 TABLET (320 MG TOTAL) BY MOUTH DAILY. 90 tablet 1  . [DISCONTINUED] potassium chloride (K-DUR) 10 MEQ tablet Take 1 tablet (10 mEq total) by mouth daily. 30 tablet 5   No current facility-administered medications on file prior to visit.    Allergies  Allergen Reactions  . Citalopram Hives    Social History   Social History  . Marital Status: Divorced    Spouse Name: N/A  . Number of Children: N/A  . Years of Education: N/A   Occupational History  . Not on file.   Social History Main Topics  . Smoking status: Former Smoker    Quit date: 02/10/2008  . Smokeless tobacco: Never Used  . Alcohol Use: No  . Drug Use: No  . Sexual Activity: Not Currently    Birth Control/ Protection: Post-menopausal   Other Topics Concern  . Not on file   Social History Narrative    Family History  Problem Relation Age of Onset  . Glaucoma Mother   . Hypertension Mother   . Thyroid disease Mother   . Stroke Father   . Hypertension Father   . Cancer Neg Hx   . Diabetes  Neg Hx     The following portions of the patient's history were reviewed and updated as appropriate: allergies, current medications, past family history, past medical history, past social history, past surgical history and problem list.  Review of Systems Review of Systems - General ROS: negative for - chills, fatigue, fever,  hot flashes, malaise or night sweats Hematological and Lymphatic ROS: negative for - bleeding problems or swollen lymph nodes Gastrointestinal ROS: negative for - abdominal pain, blood in stools, change in bowel habits and nausea/vomiting.  Long history of chronic constipation made better with Colace, and  "juicing" Musculoskeletal ROS: negative for - joint pain, muscle pain or muscular weakness Genito-Urinary ROS: negative for - change in menstrual cycle, dysmenorrhea, dyspareunia, dysuria, genital discharge, genital ulcers, hematuria, incontinence, irregular/heavy menses, nocturia or pelvic pain  Objective:   BP 132/79 mmHg  Pulse 74  Ht 5\' 6"  (1.676 m)  Wt 167 lb 8 oz (75.978 kg)  BMI 27.05 kg/m2 CONSTITUTIONAL: Well-developed, well-nourished female in no acute distress.  HENT:  Normocephalic, atraumatic.  NECK: Normal range of motion, supple, no masses.  Normal thyroid.  SKIN: Skin is warm and dry. No rash noted. Not diaphoretic. No erythema. No pallor. [place]: Alert and oriented to person, place, and time. PSYCHIATRIC: Normal mood and affect. Normal behavior. Normal judgment and thought content. CARDIOVASCULAR:Not Examined RESPIRATORY: Not Examined BREASTS: Not Examined ABDOMEN: Soft, non distended; Non tender.  No Organomegaly. PELVIC:  External Genitalia: Normal  BUS: Normal  Vagina: atrophic; prolapsing through the introitus; third-degree cystocele; mild rectocele  Cervix: Normal  Uterus: procidentia; mobile, nontender, normal size and shape  Adnexa: Normal  RV: Normal external exam; internal exam demonstrates normal sphincter tone, no rectal  masses, and mild rectocele  Bladder: Nontender MUSCULOSKELETAL: Normal range of motion. No tenderness.  No cyanosis, clubbing, or edema.     Assessment:   1. Cystocele, third-degree   2. Procidentia of uterus   3. Rectocele, mild   4. Vaginal atrophy, moderate to severe      Plan:   1.  Estrace cream, intravaginal 1/2 g daily at bedtime 4 weeks, then twice weekly. 2.  Return in 4 weeks for follow-up. 3.  Scheduled TVH BSO with anterior colporrhaphy in approximately 6 weeks. 4.  Other options of managing pelvic organ prolapse including observation versus pessary use have been declined.  Brayton Mars, MD  Note: This dictation was prepared with Dragon dictation along with smaller phrase technology. Any transcriptional errors that result from this process are unintentional.

## 2015-04-01 ENCOUNTER — Other Ambulatory Visit: Payer: Self-pay | Admitting: Internal Medicine

## 2015-04-12 DIAGNOSIS — M5416 Radiculopathy, lumbar region: Secondary | ICD-10-CM | POA: Diagnosis not present

## 2015-04-12 DIAGNOSIS — M5412 Radiculopathy, cervical region: Secondary | ICD-10-CM | POA: Diagnosis not present

## 2015-04-12 DIAGNOSIS — G608 Other hereditary and idiopathic neuropathies: Secondary | ICD-10-CM | POA: Diagnosis not present

## 2015-04-16 ENCOUNTER — Other Ambulatory Visit: Payer: Self-pay | Admitting: *Deleted

## 2015-04-16 ENCOUNTER — Encounter: Payer: Self-pay | Admitting: Internal Medicine

## 2015-04-16 ENCOUNTER — Ambulatory Visit (INDEPENDENT_AMBULATORY_CARE_PROVIDER_SITE_OTHER): Payer: Medicare Other | Admitting: Internal Medicine

## 2015-04-16 ENCOUNTER — Other Ambulatory Visit (INDEPENDENT_AMBULATORY_CARE_PROVIDER_SITE_OTHER): Payer: Medicare Other

## 2015-04-16 VITALS — BP 110/80 | HR 74 | Temp 97.7°F | Resp 18 | Ht 66.0 in | Wt 167.5 lb

## 2015-04-16 DIAGNOSIS — N939 Abnormal uterine and vaginal bleeding, unspecified: Secondary | ICD-10-CM | POA: Diagnosis not present

## 2015-04-16 DIAGNOSIS — N8110 Cystocele, unspecified: Secondary | ICD-10-CM | POA: Diagnosis not present

## 2015-04-16 DIAGNOSIS — R739 Hyperglycemia, unspecified: Secondary | ICD-10-CM

## 2015-04-16 DIAGNOSIS — E7800 Pure hypercholesterolemia, unspecified: Secondary | ICD-10-CM

## 2015-04-16 DIAGNOSIS — R1013 Epigastric pain: Secondary | ICD-10-CM | POA: Diagnosis not present

## 2015-04-16 DIAGNOSIS — R3911 Hesitancy of micturition: Secondary | ICD-10-CM

## 2015-04-16 DIAGNOSIS — I10 Essential (primary) hypertension: Secondary | ICD-10-CM | POA: Diagnosis not present

## 2015-04-16 DIAGNOSIS — R300 Dysuria: Secondary | ICD-10-CM

## 2015-04-16 DIAGNOSIS — IMO0002 Reserved for concepts with insufficient information to code with codable children: Secondary | ICD-10-CM

## 2015-04-16 LAB — URINALYSIS, ROUTINE W REFLEX MICROSCOPIC
BILIRUBIN URINE: NEGATIVE
Ketones, ur: NEGATIVE
Leukocytes, UA: NEGATIVE
NITRITE: NEGATIVE
PH: 6.5 (ref 5.0–8.0)
Specific Gravity, Urine: <=1.005 — AB (ref 1.000–1.030)
TOTAL PROTEIN, URINE-UPE24: NEGATIVE
URINE GLUCOSE: NEGATIVE
UROBILINOGEN UA: 0.2 (ref 0.0–1.0)

## 2015-04-16 LAB — POCT URINALYSIS DIPSTICK
Bilirubin, UA: NEGATIVE
Glucose, UA: NEGATIVE
Ketones, UA: NEGATIVE
Leukocytes, UA: NEGATIVE
Nitrite, UA: NEGATIVE
PH UA: 6.5
PROTEIN UA: NEGATIVE
SPEC GRAV UA: 1.015
UROBILINOGEN UA: 0.2

## 2015-04-16 LAB — HEPATIC FUNCTION PANEL
ALBUMIN: 4.3 g/dL (ref 3.5–5.2)
ALT: 14 U/L (ref 0–35)
AST: 18 U/L (ref 0–37)
Alkaline Phosphatase: 91 U/L (ref 39–117)
Bilirubin, Direct: 0.2 mg/dL (ref 0.0–0.3)
Total Bilirubin: 0.9 mg/dL (ref 0.2–1.2)
Total Protein: 7.2 g/dL (ref 6.0–8.3)

## 2015-04-16 LAB — CBC WITH DIFFERENTIAL/PLATELET
BASOS ABS: 0.1 10*3/uL (ref 0.0–0.1)
Basophils Relative: 0.5 % (ref 0.0–3.0)
EOS ABS: 0.2 10*3/uL (ref 0.0–0.7)
Eosinophils Relative: 1.7 % (ref 0.0–5.0)
HCT: 41.4 % (ref 36.0–46.0)
HEMOGLOBIN: 13.8 g/dL (ref 12.0–15.0)
Lymphocytes Relative: 16 % (ref 12.0–46.0)
Lymphs Abs: 1.8 10*3/uL (ref 0.7–4.0)
MCHC: 33.4 g/dL (ref 30.0–36.0)
MCV: 97.1 fl (ref 78.0–100.0)
MONO ABS: 2 10*3/uL — AB (ref 0.1–1.0)
Monocytes Relative: 17.1 % — ABNORMAL HIGH (ref 3.0–12.0)
NEUTROS PCT: 64.7 % (ref 43.0–77.0)
Neutro Abs: 7.4 10*3/uL (ref 1.4–7.7)
Platelets: 262 10*3/uL (ref 150.0–400.0)
RBC: 4.26 Mil/uL (ref 3.87–5.11)
RDW: 14 % (ref 11.5–15.5)
WBC: 11.5 10*3/uL — AB (ref 4.0–10.5)

## 2015-04-16 LAB — LIPID PANEL
Cholesterol: 170 mg/dL (ref 0–200)
HDL: 65.4 mg/dL (ref >39.00)
LDL Cholesterol: 83 mg/dL (ref 0–99)
NONHDL: 104.18
Total CHOL/HDL Ratio: 3
Triglycerides: 104 mg/dL (ref 0.0–149.0)
VLDL: 20.8 mg/dL (ref 0.0–40.0)

## 2015-04-16 LAB — HEMOGLOBIN A1C: HEMOGLOBIN A1C: 5.9 % (ref 4.6–6.5)

## 2015-04-16 LAB — BASIC METABOLIC PANEL
BUN: 20 mg/dL (ref 6–23)
CALCIUM: 10 mg/dL (ref 8.4–10.5)
CHLORIDE: 101 meq/L (ref 96–112)
CO2: 28 mEq/L (ref 19–32)
CREATININE: 0.9 mg/dL (ref 0.40–1.20)
GFR: 66.17 mL/min (ref >60.00)
Glucose, Bld: 92 mg/dL (ref 70–99)
Potassium: 3.6 mEq/L (ref 3.5–5.1)
Sodium: 144 mEq/L (ref 135–145)

## 2015-04-16 NOTE — Progress Notes (Signed)
Pre-visit discussion using our clinic review tool. No additional management support is needed unless otherwise documented below in the visit note.  

## 2015-04-16 NOTE — Progress Notes (Signed)
Patient ID: [name], female   DOB: 09-07-1945, 70 y.o.   MRN: [id number]   Subjective:    Patient ID: [name], female    DOB: 21-May-1945, 70 y.o.   MRN: [id number]  HPI  Patient here as a work in for possible urinary tract infection.  She reports that she did not feel well over the weekend.   States she was lying around and felt did not drink enough fluids.  She felt a sensation to urinate and could not go.  This improved with increased hydration.  She reports minimal dysuria.  Some low back discomfort.  She also reports noticing some epigastric discomfort and anterior chest discomfort.  She feels this was related to her diet.  No chest pain with increased exertion.  Some nausea.  Eating made worse.  Noticed some bleeding while in shower.  Is not sure where the blood originated from.  Has not noticed any blood in her stool.  Was questioning if from vaginal area.  Has not noticed any blood with urination.  Does report vaginal discomfort.  Was recently evaluated by gyn for prolapse.  See gyn note for details.  Started on estrogen cream.  States she has had a hard time inserting the applicator.  Also has noticed now a firm tissue mass protruding from her vagina (instead of the "balloon" she felt previously).     Past Medical History  Diagnosis Date  . Hypertension   . Anxiety   . Depression   . GERD (gastroesophageal reflux disease)   . Hypercholesterolemia   . Chronic insomnia   . COPD (chronic obstructive pulmonary disease) ([place])     documented in outside records  . Arthritis   . Heart murmur   . Osteoporosis   . Shortness of breath     with exertion  . Tingling     both hands  . History of stomach ulcers   . Hyperparathyroidism, primary ([place])   . Dysrhythmia     PALPITATIONS   . Complication of anesthesia     "AFFECTS MY MEMORY"  . Burning feet syndrome   . Neuropathy ([place])   . Anemia   . HOH (hard of hearing)     AIDS  . Scoliosis   . Palpitations   . Edema       MILD ANKLE   Past Surgical History  Procedure Laterality Date  . Dilation and curettage of uterus      X2   . Pituitary surgery      FOR TUMOR 3 YRS   . Joint replacement      RT HIP  2013  (PARTIAL)   . Breast surgery      RT BREAST CYST REMOVED 15 YRS AGO   . Anterior cervical decomp/discectomy fusion  10/28/2011    Procedure: ANTERIOR CERVICAL DECOMPRESSION/DISCECTOMY FUSION 2 LEVELS;  Surgeon: Elaina Hoops, MD;  Location: [facility];  Service: Neurosurgery;  Laterality: N/A;  . Parathyroidectomy Left 08/25/2012    Procedure: PARATHYROIDECTOMY;  Surgeon: Earnstine Regal, MD;  Location: [facility]S;  Service: General;  Laterality: Left;  . Total knee arthroplasty Right 07/18/2014    Procedure: TOTAL KNEE ARTHROPLASTY;  Surgeon: Dereck Leep, MD;  Location: [facility] ORS;  Service: Orthopedics;  Laterality: Right;  . Breast biopsy Right 1992    neg  . Cataract extraction w/phaco Right 10/30/2014    Procedure: CATARACT EXTRACTION PHACO AND INTRAOCULAR LENS PLACEMENT (IOC);  Surgeon: Birder Robson, MD;  Location: [facility]  ORS;  Service: Ophthalmology;  Laterality: Right;  US0:40 AP18.2 CDE7.42  [place] [id number] h  . Cataract extraction w/phaco Left 11/27/2014    Procedure: CATARACT EXTRACTION PHACO AND INTRAOCULAR LENS PLACEMENT (IOC);  Surgeon: Birder Robson, MD;  Location: [facility] ORS;  Service: Ophthalmology;  Laterality: Left;  [place] 00:36.9 AP%: 18.9 CDE: 6.96  [place] lot #  [id number]   Family History  Problem Relation Age of Onset  . Glaucoma Mother   . Hypertension Mother   . Thyroid disease Mother   . Stroke Father   . Hypertension Father   . Cancer Neg Hx   . Diabetes Neg Hx    Social History   Social History  . Marital Status: Divorced    Spouse Name: N/A  . Number of Children: N/A  . Years of Education: N/A   Social History Main Topics  . Smoking status: Former Smoker    Quit date: 02/10/2008  . Smokeless tobacco: Never Used  . Alcohol Use: No  . Drug Use: No   . Sexual Activity: Not Currently    Birth Control/ Protection: Post-menopausal   Other Topics Concern  . None   Social History Narrative    Outpatient Encounter Prescriptions as of 04/16/2015  Medication Sig  . acetaminophen (TYLENOL) 500 MG tablet Take 1,000 mg by mouth every 6 (six) hours as needed for fever.   Marland Kitchen amLODipine (NORVASC) 5 MG tablet TAKE 1 TABLET (5 MG TOTAL) BY MOUTH DAILY.  Marland Kitchen atorvastatin (LIPITOR) 40 MG tablet Take 1 tablet (40 mg total) by mouth daily.  . Biotin (BIOTIN MAXIMUM STRENGTH) 10 MG TABS Take 1 tablet by mouth daily.  . Carboxymethylcellulose Sodium (REFRESH TEARS OP) Apply 1 drop to eye as needed (for dry eyes).  . chlorthalidone (HYGROTON) 25 MG tablet TAKE 1 TABLET (25 MG TOTAL) BY MOUTH DAILY.  . cholecalciferol (VITAMIN D) 1000 UNITS tablet Take 1,000 Units by mouth daily.   Marland Kitchen docusate sodium (COLACE) 100 MG capsule Take 100 mg by mouth 2 (two) times daily as needed for mild constipation.   . ferrous sulfate 325 (65 FE) MG tablet Take 325 mg by mouth daily.  Marland Kitchen KLOR-CON M10 10 MEQ tablet TAKE 1 TABLET BY MOUTH DAILY.  Marland Kitchen LYRICA 50 MG capsule   . magnesium oxide (MAG-OX) 400 MG tablet Take 400 mg by mouth daily.  . Multiple Vitamins-Minerals (MULTIVITAMIN PO) Take 1 tablet by mouth daily.  . Omega-3 Fatty Acids (OMEGA 3 PO) Take 1 capsule by mouth 3 (three) times a week.   . pantoprazole (PROTONIX) 40 MG tablet Take 1 tablet (40 mg total) by mouth 2 (two) times daily.  . valsartan (DIOVAN) 320 MG tablet TAKE 1 TABLET (320 MG TOTAL) BY MOUTH DAILY.   No facility-administered encounter medications on file as of 04/16/2015.    Review of Systems  Constitutional: Negative for appetite change and unexpected weight change.  HENT: Positive for sinus pressure. Negative for congestion.   Respiratory: Negative for cough, chest tightness and shortness of breath.   Cardiovascular: Positive for chest pain. Negative for palpitations and leg swelling.   Gastrointestinal: Positive for nausea. Negative for vomiting and diarrhea.       Epigastric pain as outlined.   Genitourinary: Positive for dysuria and difficulty urinating.  Musculoskeletal: Positive for back pain. Negative for joint swelling.  Skin: Negative for color change and rash.  Neurological: Negative for dizziness, light-headedness and headaches.  Psychiatric/Behavioral: Negative for dysphoric mood and agitation.       Objective:  Physical Exam  Constitutional: She appears well-developed and well-nourished. No distress.  HENT:  Nose: Nose normal.  Mouth/Throat: Oropharynx is clear and moist.  Neck: Neck supple.  Cardiovascular: Normal rate and regular rhythm.   Pulmonary/Chest: Breath sounds normal. No respiratory distress. She has no wheezes.  Abdominal: Soft. Bowel sounds are normal. There is no tenderness.  Genitourinary:  GU exam reveals what appears to be cervix protruding from the vaginal opening.  Abrasion/question of ulceration noted on cervix.  No blood in the vault.  No adnexal masses or tenderness noted.  Rectal exam - heme negative.    Musculoskeletal: She exhibits no edema or tenderness.  Lymphadenopathy:    She has no cervical adenopathy.  Skin: No rash noted. No erythema.  Psychiatric: She has a normal mood and affect. Her behavior is normal.    BP 110/80 mmHg  Pulse 74  Temp(Src) 97.7 F (36.5 C) (Oral)  Resp 18  Ht 5\' 6"  (1.676 m)  Wt 167 lb 8 oz (75.978 kg)  BMI 27.05 kg/m2  SpO2 97% Wt Readings from Last 3 Encounters:  04/16/15 167 lb 8 oz (75.978 kg)  03/27/15 167 lb 8 oz (75.978 kg)  03/08/15 167 lb 6 oz (75.921 kg)     Lab Results  Component Value Date   WBC 11.5* 04/16/2015   HGB 13.8 04/16/2015   HCT 41.4 04/16/2015   PLT 262.0 04/16/2015   GLUCOSE 92 04/16/2015   CHOL 170 04/16/2015   TRIG 104.0 04/16/2015   HDL 65.40 04/16/2015   LDLCALC 83 04/16/2015   ALT 14 04/16/2015   AST 18 04/16/2015   NA 144 04/16/2015   K 3.6  04/16/2015   CL 101 04/16/2015   CREATININE 0.90 04/16/2015   BUN 20 04/16/2015   CO2 28 04/16/2015   TSH 0.86 09/18/2014   INR 0.91 07/05/2014   HGBA1C 5.9 04/16/2015       Assessment & Plan:   Problem List Items Addressed This Visit    Abdominal pain, epigastric    On protonix.  Add zantac in evening.  Avoid foods that aggravate.  Pursue gyn evaluation.  F/u with upper symptoms.        Cystocele    Was evaluated by gyn.  See note.  Started on estrace cream.  She has been having difficulty inserting.  Stop until gyn evaluation.        Dysuria    Does not appear to have uti.  Send urine for culture.  Pursue gyn evaluation.  Check renal function.        Vaginal bleeding    Noticed questionable vaginal bleeding this weekend.  Exam as outlined.  Heme negative on exam.  Had colonoscopy in 2015.  No hematuria.  Concern over vaginal bleeding.  Have gyn evaluate as outlined.        Relevant Orders   CBC with Differential/Platelet (Completed)   Vaginal prolapse    On exam, cervix appears to be protruding from the vaginal opening.  Has the cervical lesion as outlined.  Discussed with her.  Stop estrace cream.  She is having trouble inserting.  Have gyn reevaluated for the bleeding and the cervical prolapse and cervical lesion.         Other Visit Diagnoses    Urinary hesitancy    -  Primary    Relevant Orders    POCT Urinalysis Dipstick (Completed)    Urinalysis, Routine w reflex microscopic (not at [facility]) (Completed)    CULTURE, URINE COMPREHENSIVE  Einar Pheasant, MD

## 2015-04-16 NOTE — Addendum Note (Signed)
Addended by: Karlene Einstein D on: 04/16/2015 10:52 AM   Modules accepted: Orders

## 2015-04-16 NOTE — Addendum Note (Signed)
Addended by: Karlene Einstein D on: 04/16/2015 11:27 AM   Modules accepted: Orders

## 2015-04-18 ENCOUNTER — Encounter: Payer: Self-pay | Admitting: Obstetrics and Gynecology

## 2015-04-18 ENCOUNTER — Ambulatory Visit (INDEPENDENT_AMBULATORY_CARE_PROVIDER_SITE_OTHER): Payer: Medicare Other | Admitting: Obstetrics and Gynecology

## 2015-04-18 ENCOUNTER — Encounter: Payer: Self-pay | Admitting: Internal Medicine

## 2015-04-18 VITALS — BP 118/76 | HR 67 | Ht 66.0 in | Wt 166.6 lb

## 2015-04-18 DIAGNOSIS — N816 Rectocele: Secondary | ICD-10-CM

## 2015-04-18 DIAGNOSIS — N939 Abnormal uterine and vaginal bleeding, unspecified: Secondary | ICD-10-CM | POA: Insufficient documentation

## 2015-04-18 DIAGNOSIS — R300 Dysuria: Secondary | ICD-10-CM | POA: Insufficient documentation

## 2015-04-18 DIAGNOSIS — N86 Erosion and ectropion of cervix uteri: Secondary | ICD-10-CM | POA: Diagnosis not present

## 2015-04-18 DIAGNOSIS — R1013 Epigastric pain: Secondary | ICD-10-CM | POA: Insufficient documentation

## 2015-04-18 DIAGNOSIS — R339 Retention of urine, unspecified: Secondary | ICD-10-CM

## 2015-04-18 DIAGNOSIS — N813 Complete uterovaginal prolapse: Secondary | ICD-10-CM

## 2015-04-18 DIAGNOSIS — N8110 Cystocele, unspecified: Secondary | ICD-10-CM

## 2015-04-18 DIAGNOSIS — IMO0002 Reserved for concepts with insufficient information to code with codable children: Secondary | ICD-10-CM

## 2015-04-18 NOTE — Assessment & Plan Note (Signed)
Noticed questionable vaginal bleeding this weekend.  Exam as outlined.  Heme negative on exam.  Had colonoscopy in 2015.  No hematuria.  Concern over vaginal bleeding.  Have gyn evaluate as outlined.

## 2015-04-18 NOTE — Patient Instructions (Signed)
1. Surgery will be scheduled to correct prolapse within 1-2 weeks 2. Return for preop appointment prior to that time 3. Call our office if you are unable to urinate

## 2015-04-18 NOTE — Assessment & Plan Note (Signed)
On exam, cervix appears to be protruding from the vaginal opening.  Has the cervical lesion as outlined.  Discussed with her.  Stop estrace cream.  She is having trouble inserting.  Have gyn reevaluated for the bleeding and the cervical prolapse and cervical lesion.

## 2015-04-18 NOTE — Assessment & Plan Note (Signed)
Does not appear to have uti.  Send urine for culture.  Pursue gyn evaluation.  Check renal function.

## 2015-04-18 NOTE — Assessment & Plan Note (Signed)
On protonix.  Add zantac in evening.  Avoid foods that aggravate.  Pursue gyn evaluation.  F/u with upper symptoms.

## 2015-04-18 NOTE — Progress Notes (Signed)
GYN ENCOUNTER NOTE  Subjective:       [name] is a 70 y.o. G42P1001 female is here for gynecologic evaluation of the following issues:  1. Uterine Procidentia  2. Cystocele  3. Rectocele 4. Ulcer of Cervix 5. Urinary Retention  Patient has been having difficulties of late including vaginal bleeding. She had an episode of urinary retention and has become more symptomatic with her prolapse.   Gynecologic History No LMP recorded. Patient is postmenopausal. Contraception: post menopausal status  Menopause age 74. HRT use until the age of 27, Was using Estrace cream but d/c 2 days ago due to difficulty with application No history of abnormal Pap smears. No history of STI Patient noticed mild bleeding in shower, likely due to cervical ulceration  GU symptoms: Urinary retention Difficulty emptying bladder  Flank pain  Lower abdominal pressure No stress urinary incontinence. History of occasional UTI with out any infection in the past several years.  GI symptoms: History of chronic constipation. Last BM yesterday  Patient does Juicing In order to be regular   Obstetric History OB History  Gravida Para Term Preterm AB SAB TAB Ectopic Multiple Living  1 1 1       1     # Outcome Date GA Lbr Len/2nd Weight Sex Delivery Anes PTL Lv  1 Term 1978   8 lb 2.1 oz (3.688 kg) M Vag-Spont   Y      Past Medical History  Diagnosis Date  . Hypertension   . Anxiety   . Depression   . GERD (gastroesophageal reflux disease)   . Hypercholesterolemia   . Chronic insomnia   . COPD (chronic obstructive pulmonary disease) ([place])     documented in outside records  . Arthritis   . Heart murmur   . Osteoporosis   . Shortness of breath     with exertion  . Tingling     both hands  . History of stomach ulcers   . Hyperparathyroidism, primary ([place])   . Dysrhythmia     PALPITATIONS   . Complication of anesthesia     "AFFECTS MY MEMORY"  . Burning feet syndrome   . Neuropathy  ([place])   . Anemia   . HOH (hard of hearing)     AIDS  . Scoliosis   . Palpitations   . Edema     MILD ANKLE    Past Surgical History  Procedure Laterality Date  . Dilation and curettage of uterus      X2   . Pituitary surgery      FOR TUMOR 3 YRS   . Joint replacement      RT HIP  2013  (PARTIAL)   . Breast surgery      RT BREAST CYST REMOVED 15 YRS AGO   . Anterior cervical decomp/discectomy fusion  10/28/2011    Procedure: ANTERIOR CERVICAL DECOMPRESSION/DISCECTOMY FUSION 2 LEVELS;  Surgeon: Elaina Hoops, MD;  Location: [facility];  Service: Neurosurgery;  Laterality: N/A;  . Parathyroidectomy Left 08/25/2012    Procedure: PARATHYROIDECTOMY;  Surgeon: Earnstine Regal, MD;  Location: [facility] ORS;  Service: General;  Laterality: Left;  . Total knee arthroplasty Right 07/18/2014    Procedure: TOTAL KNEE ARTHROPLASTY;  Surgeon: Dereck Leep, MD;  Location: [facility] ORS;  Service: Orthopedics;  Laterality: Right;  . Breast biopsy Right 1992    neg  . Cataract extraction w/phaco Right 10/30/2014    Procedure: CATARACT EXTRACTION PHACO AND INTRAOCULAR LENS PLACEMENT (IOC);  Surgeon: Birder Robson, MD;  Location: [facility] ORS;  Service: Ophthalmology;  Laterality: Right;  US0:40 AP18.2 CDE7.42  [place] [id number] h  . Cataract extraction w/phaco Left 11/27/2014    Procedure: CATARACT EXTRACTION PHACO AND INTRAOCULAR LENS PLACEMENT (IOC);  Surgeon: Birder Robson, MD;  Location: [facility] ORS;  Service: Ophthalmology;  Laterality: Left;  [place] 00:36.9 AP%: 18.9 CDE: 6.96  [place] lot #  [id number] H    Current Outpatient Prescriptions on File Prior to Visit  Medication Sig Dispense Refill  . acetaminophen (TYLENOL) 500 MG tablet Take 1,000 mg by mouth every 6 (six) hours as needed for fever.     Marland Kitchen amLODipine (NORVASC) 5 MG tablet TAKE 1 TABLET (5 MG TOTAL) BY MOUTH DAILY. 90 tablet 1  . atorvastatin (LIPITOR) 40 MG tablet Take 1 tablet (40 mg total) by mouth daily. 30 tablet 5  . Biotin (BIOTIN  MAXIMUM STRENGTH) 10 MG TABS Take 1 tablet by mouth daily.    . chlorthalidone (HYGROTON) 25 MG tablet TAKE 1 TABLET (25 MG TOTAL) BY MOUTH DAILY. 90 tablet 3  . cholecalciferol (VITAMIN D) 1000 UNITS tablet Take 1,000 Units by mouth daily.     Marland Kitchen docusate sodium (COLACE) 100 MG capsule Take 100 mg by mouth 2 (two) times daily as needed for mild constipation.     . ferrous sulfate 325 (65 FE) MG tablet Take 325 mg by mouth daily.    Marland Kitchen KLOR-CON M10 10 MEQ tablet TAKE 1 TABLET BY MOUTH DAILY. 90 tablet 1  . LYRICA 50 MG capsule   2  . magnesium oxide (MAG-OX) 400 MG tablet Take 400 mg by mouth daily.    . Multiple Vitamins-Minerals (MULTIVITAMIN PO) Take 1 tablet by mouth daily.    . Omega-3 Fatty Acids (OMEGA 3 PO) Take 1 capsule by mouth 3 (three) times a week.     . pantoprazole (PROTONIX) 40 MG tablet Take 1 tablet (40 mg total) by mouth 2 (two) times daily. 180 tablet 1  . valsartan (DIOVAN) 320 MG tablet TAKE 1 TABLET (320 MG TOTAL) BY MOUTH DAILY. 90 tablet 1  . Carboxymethylcellulose Sodium (REFRESH TEARS OP) Apply 1 drop to eye as needed (for dry eyes).    . [DISCONTINUED] potassium chloride (K-DUR) 10 MEQ tablet Take 1 tablet (10 mEq total) by mouth daily. 30 tablet 5   No current facility-administered medications on file prior to visit.    Allergies  Allergen Reactions  . Citalopram Hives    Social History   Social History  . Marital Status: Divorced    Spouse Name: N/A  . Number of Children: N/A  . Years of Education: N/A   Occupational History  . Not on file.   Social History Main Topics  . Smoking status: Former Smoker    Quit date: 02/10/2008  . Smokeless tobacco: Never Used  . Alcohol Use: No  . Drug Use: No  . Sexual Activity: Not Currently    Birth Control/ Protection: Post-menopausal   Other Topics Concern  . Not on file   Social History Narrative    Family History  Problem Relation Age of Onset  . Glaucoma Mother   . Hypertension Mother   .  Thyroid disease Mother   . Stroke Father   . Hypertension Father   . Cancer Neg Hx   . Diabetes Neg Hx     The following portions of the patient's history were reviewed and updated as appropriate: allergies, current medications, past family history, past medical history, past  social history, past surgical history and problem list.  Review of Systems Review of Systems - General ROS: negative for - chills, fatigue, fever, hot flashes, malaise or night sweats Gastrointestinal ROS: Positive to Constipation negative for - abdominal pain, blood in stools, change in bowel habits and nausea/vomiting Genito-Urinary ROS: Positive for pelvic pain, urinary retention, dysuria negative for - change in menstrual cycle, dysmenorrhea, dyspareunia, genital discharge, genital ulcers, hematuria, incontinence, irregular/heavy menses, nocturia or pelvic pain  Objective:   BP 118/76 mmHg  Pulse 67  Ht 5\' 6"  (1.676 m)  Wt 166 lb 9.6 oz (75.569 kg)  BMI 26.90 kg/m2 CONSTITUTIONAL: Well-developed, well-nourished female in no acute distress.  HENT:  Normocephalic, atraumatic.  NECK: Normal range of motion, supple, no masses.  Normal thyroid.  SKIN: Skin is warm and dry. No rash noted. Not diaphoretic. No erythema. No pallor. [place]: Alert and oriented to person, place, and time.  PSYCHIATRIC: Normal mood and affect. Normal behavior. Normal judgment and thought content. CARDIOVASCULAR:Not Examined RESPIRATORY: Not Examined BREASTS: Not Examined ABDOMEN: Soft, non distended; Non tender. No Organomegaly. PELVIC: External Genitalia: Normal BUS: Normal Vagina: atrophic; prolapsing through the introitus; third-degree cystocele; mild rectocele Cervix:1 cm healing ulcer at 6:00 on the cervix, appears traumatic likely from intravaginal ERT application Uterus: procidentia; mobile, nontender, normal size and shape Adnexa:  Normal RV: Normal external exam; internal exam demonstrates normal sphincter tone, no rectal masses, and mild rectocele Bladder: Nontender MUSCULOSKELETAL: CVA Tenderness. Normal range of motion. No tenderness.  No cyanosis, clubbing, or edema.  PROCEDURE: Insertion of temporary indwelling catheter The procidentia is reduced with the use of some surgical lubricant in order to restore normal anatomic orientation. The vulva is prepped with Betadine and a red Robinson catheter was used to drain the bladder of 150 cc of urine.     Assessment:   1. Procidentia of uterus  2. Cystocele, third-degree  3. Rectocele, moderate  4. Ulcer of cervix, appears traumatic from ERT application 1cm in size at 6 o'clock  5. Urinary retention; PVR of 150 cc     Plan:  1. Straight catheter of bladder 2. TVH BSO with anterior colporrhaphy as soon as possible due to the patient's symptomatology with difficulty voiding 3. Urine culture 4. Return for preop appointment  Luz Lex PA- S Brayton Mars, MD   I have seen, interviewed, and examined the patient in conjunction with the [facility]. student and affirm the diagnosis and management plan. Camdyn Beske A. Brinsley Wence, MD, FACOG   Note: This dictation was prepared with Dragon dictation along with smaller phrase technology. Any transcriptional errors that result from this process are unintentional.

## 2015-04-18 NOTE — Assessment & Plan Note (Signed)
Was evaluated by gyn.  See note.  Started on estrace cream.  She has been having difficulty inserting.  Stop until gyn evaluation.

## 2015-04-19 ENCOUNTER — Ambulatory Visit (INDEPENDENT_AMBULATORY_CARE_PROVIDER_SITE_OTHER): Payer: Medicare Other | Admitting: Internal Medicine

## 2015-04-19 ENCOUNTER — Encounter: Payer: Self-pay | Admitting: Internal Medicine

## 2015-04-19 DIAGNOSIS — N813 Complete uterovaginal prolapse: Secondary | ICD-10-CM | POA: Diagnosis not present

## 2015-04-19 DIAGNOSIS — F419 Anxiety disorder, unspecified: Secondary | ICD-10-CM

## 2015-04-19 DIAGNOSIS — Z01810 Encounter for preprocedural cardiovascular examination: Secondary | ICD-10-CM | POA: Diagnosis not present

## 2015-04-19 DIAGNOSIS — I10 Essential (primary) hypertension: Secondary | ICD-10-CM

## 2015-04-19 DIAGNOSIS — R1013 Epigastric pain: Secondary | ICD-10-CM

## 2015-04-19 DIAGNOSIS — K2970 Gastritis, unspecified, without bleeding: Secondary | ICD-10-CM | POA: Diagnosis not present

## 2015-04-19 DIAGNOSIS — Z01818 Encounter for other preprocedural examination: Secondary | ICD-10-CM

## 2015-04-19 DIAGNOSIS — R0602 Shortness of breath: Secondary | ICD-10-CM

## 2015-04-19 DIAGNOSIS — F32A Depression, unspecified: Secondary | ICD-10-CM

## 2015-04-19 DIAGNOSIS — N86 Erosion and ectropion of cervix uteri: Secondary | ICD-10-CM

## 2015-04-19 DIAGNOSIS — N939 Abnormal uterine and vaginal bleeding, unspecified: Secondary | ICD-10-CM

## 2015-04-19 DIAGNOSIS — F329 Major depressive disorder, single episode, unspecified: Secondary | ICD-10-CM

## 2015-04-19 NOTE — Progress Notes (Signed)
Patient ID: [name], female   DOB: May 22, 1945, 70 y.o.   MRN: [id number]   Subjective:    Patient ID: [name], female    DOB: January 12, 1946, 70 y.o.   MRN: [id number]  HPI  Patient with past history of hypercholesterolemia, anxiety, depression and hypertension.  She comes in today for a scheduled follow up.  She has been having vaginal issues.  See my last note and Dr DeFrancesco's note for details.  I worked her in this week.  Found to have cervical lesion and cervix protruding from the vaginal opening.  Reevaluated by Dr DeFrancesco yesterday.  See his note.  Planning for hysterectomy soon.  Wants to do it soon secondary to contributing to bladder issues - difficulty emptying her bladder, etc.  She describes some occasional sob.  May be related to deconditioning.  Also describes some epigastric and chest discomfort - previously noted over this past weekend.  On protonix.  Started on zantac.  Still with some nausea.  No vomiting.  Eating.  No diarrhea.     Past Medical History  Diagnosis Date  . Hypertension   . Anxiety   . Depression   . GERD (gastroesophageal reflux disease)   . Hypercholesterolemia   . Chronic insomnia   . COPD (chronic obstructive pulmonary disease) ([place])     documented in outside records  . Arthritis   . Heart murmur   . Osteoporosis   . Shortness of breath     with exertion  . Tingling     both hands  . History of stomach ulcers   . Hyperparathyroidism, primary ([place])   . Dysrhythmia     PALPITATIONS   . Complication of anesthesia     "AFFECTS MY MEMORY"  . Burning feet syndrome   . Neuropathy ([place])   . Anemia   . HOH (hard of hearing)     AIDS  . Scoliosis   . Palpitations   . Edema     MILD ANKLE   Past Surgical History  Procedure Laterality Date  . Dilation and curettage of uterus      X2   . Pituitary surgery      FOR TUMOR 3 YRS   . Joint replacement      RT HIP  2013  (PARTIAL)   . Breast surgery      RT BREAST CYST REMOVED  15 YRS AGO   . Anterior cervical decomp/discectomy fusion  10/28/2011    Procedure: ANTERIOR CERVICAL DECOMPRESSION/DISCECTOMY FUSION 2 LEVELS;  Surgeon: Elaina Hoops, MD;  Location: [facility];  Service: Neurosurgery;  Laterality: N/A;  . Parathyroidectomy Left 08/25/2012    Procedure: PARATHYROIDECTOMY;  Surgeon: Earnstine Regal, MD;  Location: [facility];  Service: General;  Laterality: Left;  . Total knee arthroplasty Right 07/18/2014    Procedure: TOTAL KNEE ARTHROPLASTY;  Surgeon: Dereck Leep, MD;  Location: [facility];  Service: Orthopedics;  Laterality: Right;  . Breast biopsy Right 1992    neg  . Cataract extraction w/phaco Right 10/30/2014    Procedure: CATARACT EXTRACTION PHACO AND INTRAOCULAR LENS PLACEMENT (IOC);  Surgeon: Birder Robson, MD;  Location: [facility];  Service: Ophthalmology;  Laterality: Right;  US0:40 AP18.2 CDE7.42  [place] [id number]  . Cataract extraction w/phaco Left 11/27/2014    Procedure: CATARACT EXTRACTION PHACO AND INTRAOCULAR LENS PLACEMENT (IOC);  Surgeon: Birder Robson, MD;  Location: [facility];  Service: Ophthalmology;  Laterality: Left;  [place] 00:36.9 AP%: 18.9 CDE: 6.96  cas[place] #  [id number]   Family History  Problem Relation Age of Onset  . Glaucoma Mother   . Hypertension Mother   . Thyroid disease Mother   . Stroke Father   . Hypertension Father   . Cancer Neg Hx   . Diabetes Neg Hx    Social History   Social History  . Marital Status: Divorced    Spouse Name: N/A  . Number of Children: N/A  . Years of Education: N/A   Social History Main Topics  . Smoking status: Former Smoker    Quit date: 02/10/2008  . Smokeless tobacco: Never Used  . Alcohol Use: No  . Drug Use: No  . Sexual Activity: Not Currently    Birth Control/ Protection: Post-menopausal   Other Topics Concern  . None   Social History Narrative    Outpatient Encounter Prescriptions as of 04/19/2015  Medication Sig  . acetaminophen (TYLENOL) 500 MG tablet  Take 1,000 mg by mouth every 6 (six) hours as needed for fever.   Marland Kitchen amLODipine (NORVASC) 5 MG tablet TAKE 1 TABLET (5 MG TOTAL) BY MOUTH DAILY.  Marland Kitchen atorvastatin (LIPITOR) 40 MG tablet Take 1 tablet (40 mg total) by mouth daily.  . Biotin (BIOTIN MAXIMUM STRENGTH) 10 MG TABS Take 1 tablet by mouth daily.  . Carboxymethylcellulose Sodium (REFRESH TEARS OP) Apply 1 drop to eye as needed (for dry eyes).  . chlorthalidone (HYGROTON) 25 MG tablet TAKE 1 TABLET (25 MG TOTAL) BY MOUTH DAILY.  . cholecalciferol (VITAMIN D) 1000 UNITS tablet Take 1,000 Units by mouth daily.   Marland Kitchen docusate sodium (COLACE) 100 MG capsule Take 100 mg by mouth 2 (two) times daily as needed for mild constipation.   . ferrous sulfate 325 (65 FE) MG tablet Take 325 mg by mouth daily.  Marland Kitchen KLOR-CON M10 10 MEQ tablet TAKE 1 TABLET BY MOUTH DAILY.  Marland Kitchen LYRICA 50 MG capsule   . magnesium oxide (MAG-OX) 400 MG tablet Take 400 mg by mouth daily.  . Multiple Vitamins-Minerals (MULTIVITAMIN PO) Take 1 tablet by mouth daily.  . Omega-3 Fatty Acids (OMEGA 3 PO) Take 1 capsule by mouth 3 (three) times a week.   . pantoprazole (PROTONIX) 40 MG tablet Take 1 tablet (40 mg total) by mouth 2 (two) times daily.  . ranitidine (ZANTAC) 150 MG tablet Take 150 mg by mouth 2 (two) times daily.  . valsartan (DIOVAN) 320 MG tablet TAKE 1 TABLET (320 MG TOTAL) BY MOUTH DAILY.   No facility-administered encounter medications on file as of 04/19/2015.     Review of Systems  Constitutional: Negative for appetite change and unexpected weight change.  HENT: Negative for congestion and sinus pressure.   Respiratory: Positive for shortness of breath. Negative for cough and chest tightness.   Cardiovascular: Positive for chest pain. Negative for palpitations and leg swelling.  Gastrointestinal: Positive for nausea. Negative for vomiting and diarrhea.       Some lower abdominal pressure.    Genitourinary: Positive for dysuria and difficulty urinating.    Musculoskeletal: Negative for back pain and joint swelling.  Skin: Negative for color change and rash.  Neurological: Negative for dizziness, light-headedness and headaches.  Psychiatric/Behavioral: Negative for dysphoric mood and agitation.       Objective:    Physical Exam  Constitutional: She appears well-developed and well-nourished. No distress.  HENT:  Nose: Nose normal.  Mouth/Throat: Oropharynx is clear and moist.  Neck: Neck supple.  Cardiovascular: Normal rate and regular rhythm.  Pulmonary/Chest: Breath sounds normal. No respiratory distress. She has no wheezes.  Abdominal: Soft. Bowel sounds are normal.  Minimal tenderness to palpation - lower abdomen and suprapubic region.   Musculoskeletal: She exhibits no edema or tenderness.  Lymphadenopathy:    She has no cervical adenopathy.  Skin: No rash noted. No erythema.  Psychiatric: She has a normal mood and affect. Her behavior is normal.    BP 108/62 mmHg  Pulse 72  Temp(Src) 98.6 F (37 C) (Oral)  Resp 12  Ht 5\' 6"  (1.676 m)  Wt 167 lb 12.8 oz (76.114 kg)  BMI 27.10 kg/m2  SpO2 96% Wt Readings from Last 3 Encounters:  04/19/15 167 lb 12.8 oz (76.114 kg)  04/18/15 166 lb 9.6 oz (75.569 kg)  04/16/15 167 lb 8 oz (75.978 kg)     Lab Results  Component Value Date   WBC 11.5* 04/16/2015   HGB 13.8 04/16/2015   HCT 41.4 04/16/2015   PLT 262.0 04/16/2015   GLUCOSE 92 04/16/2015   CHOL 170 04/16/2015   TRIG 104.0 04/16/2015   HDL 65.40 04/16/2015   LDLCALC 83 04/16/2015   ALT 14 04/16/2015   AST 18 04/16/2015   NA 144 04/16/2015   K 3.6 04/16/2015   CL 101 04/16/2015   CREATININE 0.90 04/16/2015   BUN 20 04/16/2015   CO2 28 04/16/2015   TSH 0.86 09/18/2014   INR 0.91 07/05/2014   HGBA1C 5.9 04/16/2015       Assessment & Plan:   Problem List Items Addressed This Visit    Abdominal pain, epigastric    On protonix.  Zantac recently added.  Has had EGD previously.  If persistent symptoms, will  need referral back to GI for evaluation and question of f/u EGD.        Anxiety    No longer seeing psychiatry.  Stable.        Cervical ulceration    Ulceration found on cervix.  Seeing gyn.  Planning for hysterectomy.        Depression    No longer seeing psychiatry.  Stable.        Gastritis    Has a history of gastritis.  EGD with gastritis and hiatal hernia _8/2015.  On protonix bid.  With some epigastric symptoms previously.  Added zantac.  Follow.        Hypertension    Blood pressure has been under good control.  Continue same medication regimen.  Follow pressures.  Follow metabolic panel.        Pre-op evaluation    See above for recommendation of cardiology evaluation prior to surgery.  EKG as outlined.  Once cleared from cardiology, will also need close intra op and post op monitoring of her heart rate and blood pressure to avoid extremes.        Procidentia of uterus    Evaluated by Dr Enzo Bi.  See his note for details.  Planning for hysterectomy.        SOB (shortness of breath)    Describes some sob with exertion.  Recent chest discomfort.  Question if discomfort related to GI etiology.  Add zantac as outlined.  Already on protonix.  Given the symptoms and upcoming surgery, EKG obtained and revealed SR with TWI in III and v1 and v2.  No acute ischemic changes compared to previous EKG.  Will have cardiology evaluate and give pre op recommendations.        Relevant Orders   Ambulatory referral to  Cardiology   Vaginal bleeding    Pt reports that he felt the bleeding was coming from the cervical ulceration.  Had another episode.  Planning for hysterectomy.  Follow.         Other Visit Diagnoses    Pre-operative cardiovascular examination        Relevant Orders    EKG 12-Lead (Completed)    Ambulatory referral to Cardiology      I spent 40 minutes with the patient and more than 50% of the time was spent in consultation regarding the above.     Einar Pheasant, MD

## 2015-04-19 NOTE — Progress Notes (Signed)
Pre visit review using our clinic review tool, if applicable. No additional management support is needed unless otherwise documented below in the visit note. 

## 2015-04-20 LAB — CULTURE, URINE COMPREHENSIVE: Colony Count: >=100000

## 2015-04-21 ENCOUNTER — Encounter: Payer: Self-pay | Admitting: Internal Medicine

## 2015-04-21 DIAGNOSIS — N86 Erosion and ectropion of cervix uteri: Secondary | ICD-10-CM | POA: Insufficient documentation

## 2015-04-21 NOTE — Assessment & Plan Note (Signed)
See above for recommendation of cardiology evaluation prior to surgery.  EKG as outlined.  Once cleared from cardiology, will also need close intra op and post op monitoring of her heart rate and blood pressure to avoid extremes.

## 2015-04-21 NOTE — Assessment & Plan Note (Signed)
No longer seeing psychiatry.  Stable.

## 2015-04-21 NOTE — Assessment & Plan Note (Signed)
Describes some sob with exertion.  Recent chest discomfort.  Question if discomfort related to GI etiology.  Add zantac as outlined.  Already on protonix.  Given the symptoms and upcoming surgery, EKG obtained and revealed SR with TWI in III and v1 and v2.  No acute ischemic changes compared to previous EKG.  Will have cardiology evaluate and give pre op recommendations.

## 2015-04-21 NOTE — Assessment & Plan Note (Signed)
On protonix.  Zantac recently added.  Has had EGD previously.  If persistent symptoms, will need referral back to GI for evaluation and question of f/u EGD.

## 2015-04-21 NOTE — Assessment & Plan Note (Signed)
Evaluated by Dr Enzo Bi.  See his note for details.  Planning for hysterectomy.

## 2015-04-21 NOTE — Assessment & Plan Note (Signed)
Ulceration found on cervix.  Seeing gyn.  Planning for hysterectomy.

## 2015-04-21 NOTE — Assessment & Plan Note (Signed)
Pt reports that he felt the bleeding was coming from the cervical ulceration.  Had another episode.  Planning for hysterectomy.  Follow.

## 2015-04-21 NOTE — Assessment & Plan Note (Signed)
Blood pressure has been under good control.  Continue same medication regimen.  Follow pressures.  Follow metabolic panel.   

## 2015-04-21 NOTE — Assessment & Plan Note (Signed)
Has a history of gastritis.  EGD with gastritis and hiatal hernia _8/2015.  On protonix bid.  With some epigastric symptoms previously.  Added zantac.  Follow.

## 2015-04-22 DIAGNOSIS — R339 Retention of urine, unspecified: Secondary | ICD-10-CM | POA: Diagnosis not present

## 2015-04-22 NOTE — Addendum Note (Signed)
Addended by: Elouise Munroe on: 04/22/2015 10:25 AM   Modules accepted: Orders

## 2015-04-23 ENCOUNTER — Other Ambulatory Visit: Payer: Self-pay | Admitting: *Deleted

## 2015-04-23 MED ORDER — NITROFURANTOIN MONOHYD MACRO 100 MG PO CAPS
100.0000 mg | ORAL_CAPSULE | Freq: Two times a day (BID) | ORAL | Status: DC
Start: 2015-04-23 — End: 2015-05-08

## 2015-04-23 NOTE — Telephone Encounter (Signed)
Sent in Abx per urine cx result

## 2015-04-24 LAB — URINE CULTURE

## 2015-04-26 ENCOUNTER — Telehealth: Payer: Self-pay | Admitting: Obstetrics and Gynecology

## 2015-04-26 NOTE — Telephone Encounter (Signed)
ERROR, PT HAD A SCEDULEING ? NOT ? FOR NURSE

## 2015-04-30 ENCOUNTER — Telehealth: Payer: Self-pay

## 2015-04-30 MED ORDER — AMOXICILLIN 500 MG PO CAPS: 500.0000 mg | ORAL_CAPSULE | Freq: Two times a day (BID) | ORAL | Status: DC

## 2015-04-30 NOTE — Telephone Encounter (Signed)
Pt aware. Med erx. 

## 2015-04-30 NOTE — Telephone Encounter (Signed)
-----   Message from Brayton Mars, MD sent at 04/30/2015  8:06 AM EDT ----- Please notify - Abnormal Labs Please call in Amoxicillin 500 mg twice a day for 7 days

## 2015-05-01 DIAGNOSIS — I10 Essential (primary) hypertension: Secondary | ICD-10-CM | POA: Diagnosis not present

## 2015-05-01 DIAGNOSIS — R0789 Other chest pain: Secondary | ICD-10-CM | POA: Diagnosis not present

## 2015-05-01 DIAGNOSIS — J449 Chronic obstructive pulmonary disease, unspecified: Secondary | ICD-10-CM | POA: Diagnosis not present

## 2015-05-01 DIAGNOSIS — E782 Mixed hyperlipidemia: Secondary | ICD-10-CM | POA: Diagnosis not present

## 2015-05-01 DIAGNOSIS — R0602 Shortness of breath: Secondary | ICD-10-CM | POA: Diagnosis not present

## 2015-05-07 ENCOUNTER — Other Ambulatory Visit: Payer: Self-pay | Admitting: Internal Medicine

## 2015-05-08 ENCOUNTER — Encounter: Payer: Self-pay | Admitting: Obstetrics and Gynecology

## 2015-05-08 ENCOUNTER — Encounter
Admission: RE | Admit: 2015-05-08 | Discharge: 2015-05-08 | Disposition: A | Discharge status: Home / Self Care | Payer: Medicare Other | Source: Ambulatory Visit | Attending: Obstetrics and Gynecology | Admitting: Obstetrics and Gynecology

## 2015-05-08 ENCOUNTER — Telehealth: Payer: Self-pay

## 2015-05-08 ENCOUNTER — Ambulatory Visit (INDEPENDENT_AMBULATORY_CARE_PROVIDER_SITE_OTHER): Payer: Medicare Other | Admitting: Obstetrics and Gynecology

## 2015-05-08 VITALS — BP 116/73 | HR 64 | Ht 66.0 in | Wt 166.2 lb

## 2015-05-08 DIAGNOSIS — Z01818 Encounter for other preprocedural examination: Secondary | ICD-10-CM

## 2015-05-08 DIAGNOSIS — IMO0002 Reserved for concepts with insufficient information to code with codable children: Secondary | ICD-10-CM

## 2015-05-08 DIAGNOSIS — Z01812 Encounter for preprocedural laboratory examination: Secondary | ICD-10-CM | POA: Insufficient documentation

## 2015-05-08 DIAGNOSIS — N8110 Cystocele, unspecified: Secondary | ICD-10-CM

## 2015-05-08 DIAGNOSIS — N816 Rectocele: Secondary | ICD-10-CM

## 2015-05-08 DIAGNOSIS — N813 Complete uterovaginal prolapse: Secondary | ICD-10-CM

## 2015-05-08 LAB — CBC WITH DIFFERENTIAL/PLATELET
BASOS PCT: 2 %
Basophils Absolute: 0.2 10*3/uL — ABNORMAL HIGH (ref 0–0.1)
EOS ABS: 0.2 10*3/uL (ref 0–0.7)
EOS PCT: 3 %
HCT: 36.3 % (ref 35.0–47.0)
HEMOGLOBIN: 12.5 g/dL (ref 12.0–16.0)
Lymphocytes Relative: 25 %
Lymphs Abs: 1.9 10*3/uL (ref 1.0–3.6)
MCH: 32.8 pg (ref 26.0–34.0)
MCHC: 34.5 g/dL (ref 32.0–36.0)
MCV: 94.9 fL (ref 80.0–100.0)
MONOS PCT: 13 %
Monocytes Absolute: 1 10*3/uL — ABNORMAL HIGH (ref 0.2–0.9)
NEUTROS PCT: 57 %
Neutro Abs: 4.4 10*3/uL (ref 1.4–6.5)
PLATELETS: 350 10*3/uL (ref 150–440)
RBC: 3.82 MIL/uL (ref 3.80–5.20)
RDW: 13.4 % (ref 11.5–14.5)
WBC: 7.7 10*3/uL (ref 3.6–11.0)

## 2015-05-08 LAB — RAPID HIV SCREEN (HIV 1/2 AB+AG)
HIV 1/2 ANTIBODIES: NONREACTIVE
HIV-1 P24 ANTIGEN - HIV24: NONREACTIVE

## 2015-05-08 LAB — POTASSIUM: Potassium: 3 mmol/L — ABNORMAL LOW (ref 3.5–5.1)

## 2015-05-08 MED ORDER — POTASSIUM CHLORIDE ER 8 MEQ PO TBCR: 8.0000 meq | EXTENDED_RELEASE_TABLET | Freq: Every day | ORAL | Status: DC

## 2015-05-08 NOTE — H&P (Addendum)
Subjective:    PREOPERATIVE HISTORY AND PHYSICAL    Date of surgery: 05/13/2015 Chief complaint: 1. Uterine procidentia; third-degree cystocele; rectocele    Patient is a 70 y.o. G1P1039female scheduled for TVH BSO with anterior colporrhaphy due to uterine procidentia and third-degree cystocele   Gynecologic History No LMP recorded. Patient is postmenopausal. Contraception: post menopausal status  Menopause age 88. HRT use until the age of 45, Was using Estrace cream but d/c 2 days ago due to difficulty with application No history of abnormal Pap smears. No history of STI Patient noticed mild bleeding in shower, likely due to cervical ulceration  GU symptoms: Urinary retention Difficulty emptying bladder  Flank pain  Lower abdominal pressure No stress urinary incontinence. History of occasional UTI with out any infection in the past several years.  GI symptoms: History of chronic constipation. Last BM yesterday  Patient does Juicing In order to be regular   Menstrual History: OB History    Gravida Para Term Preterm AB TAB SAB Ectopic Multiple Living   1 1 1       1      No LMP recorded. Patient is postmenopausal.    Past Medical History  Diagnosis Date  . Hypertension   . Anxiety   . Depression   . GERD (gastroesophageal reflux disease)   . Hypercholesterolemia   . Chronic insomnia   . COPD (chronic obstructive pulmonary disease) ([place])     documented in outside records  . Arthritis   . Heart murmur   . Osteoporosis   . Shortness of breath     with exertion  . Tingling     both hands  . History of stomach ulcers   . Hyperparathyroidism, primary ([place])   . Dysrhythmia     PALPITATIONS   . Complication of anesthesia     "AFFECTS MY MEMORY"  . Burning feet syndrome   . Neuropathy ([place])   . Anemia   . HOH (hard of hearing)     AIDS  . Scoliosis   . Palpitations   . Edema     MILD ANKLE    Past Surgical History  Procedure Laterality Date  .  Dilation and curettage of uterus      X2   . Pituitary surgery      FOR TUMOR 3 YRS   . Joint replacement      RT HIP  2013  (PARTIAL)   . Breast surgery      RT BREAST CYST REMOVED 15 YRS AGO   . Anterior cervical decomp/discectomy fusion  10/28/2011    Procedure: ANTERIOR CERVICAL DECOMPRESSION/DISCECTOMY FUSION 2 LEVELS;  Surgeon: Elaina Hoops, MD;  Location: [facility];  Service: Neurosurgery;  Laterality: N/A;  . Parathyroidectomy Left 08/25/2012    Procedure: PARATHYROIDECTOMY;  Surgeon: Earnstine Regal, MD;  Location: [facility];  Service: General;  Laterality: Left;  . Total knee arthroplasty Right 07/18/2014    Procedure: TOTAL KNEE ARTHROPLASTY;  Surgeon: Dereck Leep, MD;  Location: [facility];  Service: Orthopedics;  Laterality: Right;  . Breast biopsy Right 1992    neg  . Cataract extraction w/phaco Right 10/30/2014    Procedure: CATARACT EXTRACTION PHACO AND INTRAOCULAR LENS PLACEMENT (IOC);  Surgeon: Birder Robson, MD;  Location: [facility];  Service: Ophthalmology;  Laterality: Right;  US0:40 AP18.2 CDE7.42  [place] [id number]  . Cataract extraction w/phaco Left 11/27/2014    Procedure: CATARACT EXTRACTION PHACO AND INTRAOCULAR LENS PLACEMENT (IOC);  Surgeon: Birder Robson, MD;  Location: [facility];  Service: Ophthalmology;  Laterality: Left;  [place] 00:36.9 AP%: 18.9 CDE: 6.96  cas[place] lot #  [id number]    OB History  Gravida Para Term Preterm AB SAB TAB Ectopic Multiple Living  1 1 1       1     # Outcome Date GA Lbr Len/2nd Weight Sex Delivery Anes PTL Lv  1 Term 1978   8 lb 2.1 oz (3.688 kg) M Vag-Spont   Y      Social History   Social History  . Marital Status: Divorced    Spouse Name: N/A  . Number of Children: N/A  . Years of Education: N/A   Social History Main Topics  . Smoking status: Former Smoker    Quit date: 02/10/2008  . Smokeless tobacco: Never Used  . Alcohol Use: No  . Drug Use: No  . Sexual Activity: Not Currently    Birth Control/  Protection: Post-menopausal   Other Topics Concern  . None   Social History Narrative    Family History  Problem Relation Age of Onset  . Glaucoma Mother   . Hypertension Mother   . Thyroid disease Mother   . Stroke Father   . Hypertension Father   . Cancer Neg Hx   . Diabetes Neg Hx      (Not in a hospital admission)  Allergies  Allergen Reactions  . Citalopram Hives    Review of Systems Constitutional: No recent fever/chills/sweats Respiratory: No recent cough/bronchitis Cardiovascular: No chest pain Gastrointestinal: No recent nausea/vomiting/diarrhea Genitourinary: No UTI symptoms Hematologic/lymphatic:No history of coagulopathy or recent blood thinner use    Objective:    BP 116/73 mmHg  Pulse 64  Ht 5\' 6"  (1.676 m)  Wt 166 lb 3.2 oz (75.388 kg)  BMI 26.84 kg/m2  General:   Normal  Skin:   normal  HEENT:  Normal  Neck:  Supple without Adenopathy or Thyromegaly  Lungs:   Heart:              Breasts:   Abdomen:  Pelvis:  M/S   Extremeties:  Neuro:    clear to auscultation bilaterally   Normal without murmur   Not Examined   soft, non-tender; bowel sounds normal; no masses,  no organomegaly   Exam deferred to OR  No CVAT  Warm/Dry   Normal      PELVIC: External Genitalia: Normal BUS: Normal Vagina: atrophic; prolapsing through the introitus; third-degree cystocele; mild rectocele Cervix:1 cm healing ulcer at 6:00 on the cervix, appears traumatic likely from intravaginal ERT application Uterus: procidentia; mobile, nontender, normal size and shape Adnexa: Normal RV: Normal external exam; internal exam demonstrates normal sphincter tone, no rectal masses, and mild rectocele Bladder: Nontender     Assessment:    1. Symptomatic pelvic relaxation   Plan:  1. TVH BSO with anterior/posterior colporrhaphy  Preoperative counseling: The  patient is to undergo TVH BSO with anterior/posterior colporrhaphy on 05/13/2015. She is understanding of the planned procedure and is aware of and is accepting of all surgical risks which include but are not limited to bleeding, infection, pelvic organ injury with need for repair, blood clot disorders, anesthesia risks, etc. All questions have been answered. Informed consent is given. Patient is ready and willing to proceed with surgery as scheduled.  Brayton Mars, MD  Note: This dictation was prepared with Dragon dictation along with smaller phrase technology. Any transcriptional errors that result from this process are unintentional.

## 2015-05-08 NOTE — Patient Instructions (Signed)
  Your procedure is scheduled on: 05/13/15 Mon Report to Day Surgery.2nd floor medical mall To find out your arrival time please call [phone] between 1PM - 3PM on 05/10/15 Fri  Remember: Instructions that are not followed completely may result in serious medical risk, up to and including death, or upon the discretion of your surgeon and anesthesiologist your surgery may need to be rescheduled.    _x___ 1. Do not eat food or drink liquids after midnight. No gum chewing or hard candies.     _x___ 2. No Alcohol for 24 hours before or after surgery.   ____ 3. Bring all medications with you on the day of surgery if instructed.    __x__ 4. Notify your doctor if there is any change in your medical condition     (cold, fever, infections).     Do not wear jewelry, make-up, hairpins, clips or nail polish.  Do not wear lotions, powders, or perfumes. You may wear deodorant.  Do not shave 48 hours prior to surgery. Men may shave face and neck.  Do not bring valuables to the hospital.    [facility] is not responsible for any belongings or valuables.               Contacts, dentures or bridgework may not be worn into surgery.  Leave your suitcase in the car. After surgery it may be brought to your room.  For patients admitted to the hospital, discharge time is determined by your                treatment team.   Patients discharged the day of surgery will not be allowed to drive home.   Please read over the following fact sheets that you were given:      _x___ Take these medicines the morning of surgery with A SIP OF WATER:    1. amLODipine (NORVASC) 5 MG tablet  2. chlorthalidone (HYGROTON) 25 MG tablet  3. pantoprazole (PROTONIX) 40 MG tablet  4.ranitidine (ZANTAC) 150 MG tablet  5.  6.  ____ Fleet Enema (as directed)   _x___ Use CHG Soap as directed  ____ Use inhalers on the day of surgery  ____ Stop metformin 2 days prior to surgery    ____ Take 1/2 of usual insulin dose the  night before surgery and none on the morning of surgery.   _x___ Stop Coumadin/Plavix/aspirin on stop aspirin 1 week before surgery  _x___ Stop Anti-inflammatories on 1 week before surgery may use Tylenol as needed   _x___ Stop supplements until after surgery.  Stop fish oil until after surgery  ____ Bring C-Pap to the hospital.

## 2015-05-08 NOTE — Progress Notes (Signed)
Subjective:    PREOPERATIVE HISTORY AND PHYSICAL    Date of surgery: 05/13/2015 Chief complaint: 1. Uterine procidentia; third-degree cystocele; rectocele    Patient is a 70 y.o. G1P108female scheduled for TVH BSO with anterior colporrhaphy due to uterine procidentia and third-degree cystocele   Gynecologic History No LMP recorded. Patient is postmenopausal. Contraception: post menopausal status  Menopause age 73. HRT use until the age of 55, Was using Estrace cream but d/c 2 days ago due to difficulty with application No history of abnormal Pap smears. No history of STI Patient noticed mild bleeding in shower, likely due to cervical ulceration  GU symptoms: Urinary retention Difficulty emptying bladder  Flank pain  Lower abdominal pressure No stress urinary incontinence. History of occasional UTI with out any infection in the past several years.  GI symptoms: History of chronic constipation. Last BM yesterday  Patient does Juicing In order to be regular   Menstrual History: OB History    Gravida Para Term Preterm AB TAB SAB Ectopic Multiple Living   1 1 1       1       No LMP recorded. Patient is postmenopausal.    Past Medical History  Diagnosis Date  . Hypertension   . Anxiety   . Depression   . GERD (gastroesophageal reflux disease)   . Hypercholesterolemia   . Chronic insomnia   . COPD (chronic obstructive pulmonary disease) ([place])     documented in outside records  . Arthritis   . Heart murmur   . Osteoporosis   . Shortness of breath     with exertion  . Tingling     both hands  . History of stomach ulcers   . Hyperparathyroidism, primary ([place])   . Dysrhythmia     PALPITATIONS   . Complication of anesthesia     "AFFECTS MY MEMORY"  . Burning feet syndrome   . Neuropathy ([place])   . Anemia   . HOH (hard of hearing)     AIDS  . Scoliosis   . Palpitations   . Edema     MILD ANKLE    Past Surgical History  Procedure Laterality Date  .  Dilation and curettage of uterus      X2   . Pituitary surgery      FOR TUMOR 3 YRS   . Joint replacement      RT HIP  2013  (PARTIAL)   . Breast surgery      RT BREAST CYST REMOVED 15 YRS AGO   . Anterior cervical decomp/discectomy fusion  10/28/2011    Procedure: ANTERIOR CERVICAL DECOMPRESSION/DISCECTOMY FUSION 2 LEVELS;  Surgeon: Elaina Hoops, MD;  Location: [facility];  Service: Neurosurgery;  Laterality: N/A;  . Parathyroidectomy Left 08/25/2012    Procedure: PARATHYROIDECTOMY;  Surgeon: Earnstine Regal, MD;  Location: [facility];  Service: General;  Laterality: Left;  . Total knee arthroplasty Right 07/18/2014    Procedure: TOTAL KNEE ARTHROPLASTY;  Surgeon: Dereck Leep, MD;  Location: [facility];  Service: Orthopedics;  Laterality: Right;  . Breast biopsy Right 1992    neg  . Cataract extraction w/phaco Right 10/30/2014    Procedure: CATARACT EXTRACTION PHACO AND INTRAOCULAR LENS PLACEMENT (IOC);  Surgeon: Birder Robson, MD;  Location: [facility]S;  Service: Ophthalmology;  Laterality: Right;  US0:40 AP18.2 CDE7.42  [place] [id number]  . Cataract extraction w/phaco Left 11/27/2014    Procedure: CATARACT EXTRACTION PHACO AND INTRAOCULAR LENS PLACEMENT (IOC);  Surgeon: Birder Robson,  MD;  Location: [facility];  Service: Ophthalmology;  Laterality: Left;  [place] 00:36.9 AP%: 18.9 CDE: 6.96  [place] #  [id number]    OB History  Gravida Para Term Preterm AB SAB TAB Ectopic Multiple Living  1 1 1       1     # Outcome Date GA Lbr Len/2nd Weight Sex Delivery Anes PTL Lv  1 Term 1978   8 lb 2.1 oz (3.688 kg) M Vag-Spont   Y      Social History   Social History  . Marital Status: Divorced    Spouse Name: N/A  . Number of Children: N/A  . Years of Education: N/A   Social History Main Topics  . Smoking status: Former Smoker    Quit date: 02/10/2008  . Smokeless tobacco: Never Used  . Alcohol Use: No  . Drug Use: No  . Sexual Activity: Not Currently    Birth Control/  Protection: Post-menopausal   Other Topics Concern  . None   Social History Narrative    Family History  Problem Relation Age of Onset  . Glaucoma Mother   . Hypertension Mother   . Thyroid disease Mother   . Stroke Father   . Hypertension Father   . Cancer Neg Hx   . Diabetes Neg Hx      (Not in a hospital admission)  Allergies  Allergen Reactions  . Citalopram Hives    Review of Systems Constitutional: No recent fever/chills/sweats Respiratory: No recent cough/bronchitis Cardiovascular: No chest pain Gastrointestinal: No recent nausea/vomiting/diarrhea Genitourinary: No UTI symptoms Hematologic/lymphatic:No history of coagulopathy or recent blood thinner use    Objective:    BP 116/73 mmHg  Pulse 64  Ht 5\' 6"  (1.676 m)  Wt 166 lb 3.2 oz (75.388 kg)  BMI 26.84 kg/m2  General:   Normal  Skin:   normal  HEENT:  Normal  Neck:  Supple without Adenopathy or Thyromegaly  Lungs:   Heart:              Breasts:   Abdomen:  Pelvis:  M/S   Extremeties:  Neuro:    clear to auscultation bilaterally   Normal without murmur   Not Examined   soft, non-tender; bowel sounds normal; no masses,  no organomegaly   Exam deferred to OR  No CVAT  Warm/Dry   Normal      PELVIC: External Genitalia: Normal BUS: Normal Vagina: atrophic; prolapsing through the introitus; third-degree cystocele; mild rectocele Cervix:1 cm healing ulcer at 6:00 on the cervix, appears traumatic likely from intravaginal ERT application Uterus: procidentia; mobile, nontender, normal size and shape Adnexa: Normal RV: Normal external exam; internal exam demonstrates normal sphincter tone, no rectal masses, and mild rectocele Bladder: Nontender     Assessment:    1. Symptomatic pelvic relaxation   Plan:  1. TVH BSO with anterior/posterior colporrhaphy  Preoperative counseling: The  patient is to undergo TVH BSO with anterior/posterior colporrhaphy on 05/13/2015. She is understanding of the planned procedure and is aware of and is accepting of all surgical risks which include but are not limited to bleeding, infection, pelvic organ injury with need for repair, blood clot disorders, anesthesia risks, etc. All questions have been answered. Informed consent is given. Patient is ready and willing to proceed with surgery as scheduled.  Brayton Mars, MD  Note: This dictation was prepared with Dragon dictation along with smaller phrase technology. Any transcriptional errors that result from this process are unintentional.

## 2015-05-08 NOTE — Patient Instructions (Signed)
1. Postop check appointment will be 05/21/2015

## 2015-05-08 NOTE — Pre-Procedure Instructions (Signed)
Called and faxed abnormal potassium result and request for an increase in  potassium supplement.

## 2015-05-08 NOTE — Telephone Encounter (Signed)
Pt aware K is low. [facility].

## 2015-05-08 NOTE — Addendum Note (Signed)
Addended by: Elouise Munroe on: 05/08/2015 04:57 PM   Modules accepted: Orders

## 2015-05-09 ENCOUNTER — Other Ambulatory Visit: Payer: Self-pay

## 2015-05-09 DIAGNOSIS — R0789 Other chest pain: Secondary | ICD-10-CM | POA: Diagnosis not present

## 2015-05-09 LAB — TYPE AND SCREEN
ABO/RH(D): O POS
Antibody Screen: NEGATIVE

## 2015-05-09 LAB — RPR: RPR: NONREACTIVE

## 2015-05-09 MED ORDER — POTASSIUM CHLORIDE CRYS ER 10 MEQ PO TBCR: 10.0000 meq | EXTENDED_RELEASE_TABLET | Freq: Every day | ORAL | Status: DC

## 2015-05-13 ENCOUNTER — Observation Stay
Admission: RE | Admit: 2015-05-13 | Discharge: 2015-05-14 | Disposition: A | Discharge status: Home / Self Care | Payer: Medicare Other | Source: Ambulatory Visit | Attending: Obstetrics and Gynecology | Admitting: Obstetrics and Gynecology

## 2015-05-13 ENCOUNTER — Encounter: Payer: Self-pay | Admitting: Anesthesiology

## 2015-05-13 ENCOUNTER — Ambulatory Visit: Payer: Medicare Other | Admitting: Anesthesiology

## 2015-05-13 ENCOUNTER — Encounter
Admission: RE | Disposition: A | Discharge status: Home / Self Care | Payer: Self-pay | Source: Ambulatory Visit | Attending: Obstetrics and Gynecology

## 2015-05-13 DIAGNOSIS — I10 Essential (primary) hypertension: Secondary | ICD-10-CM | POA: Diagnosis not present

## 2015-05-13 DIAGNOSIS — R339 Retention of urine, unspecified: Secondary | ICD-10-CM

## 2015-05-13 DIAGNOSIS — N840 Polyp of corpus uteri: Secondary | ICD-10-CM | POA: Diagnosis not present

## 2015-05-13 DIAGNOSIS — F5104 Psychophysiologic insomnia: Secondary | ICD-10-CM | POA: Diagnosis not present

## 2015-05-13 DIAGNOSIS — Z7982 Long term (current) use of aspirin: Secondary | ICD-10-CM | POA: Diagnosis not present

## 2015-05-13 DIAGNOSIS — F329 Major depressive disorder, single episode, unspecified: Secondary | ICD-10-CM | POA: Diagnosis not present

## 2015-05-13 DIAGNOSIS — F419 Anxiety disorder, unspecified: Secondary | ICD-10-CM | POA: Diagnosis not present

## 2015-05-13 DIAGNOSIS — Z87891 Personal history of nicotine dependence: Secondary | ICD-10-CM | POA: Insufficient documentation

## 2015-05-13 DIAGNOSIS — N8110 Cystocele, unspecified: Secondary | ICD-10-CM | POA: Diagnosis not present

## 2015-05-13 DIAGNOSIS — N813 Complete uterovaginal prolapse: Principal | ICD-10-CM

## 2015-05-13 DIAGNOSIS — N816 Rectocele: Secondary | ICD-10-CM

## 2015-05-13 DIAGNOSIS — Z8719 Personal history of other diseases of the digestive system: Secondary | ICD-10-CM | POA: Insufficient documentation

## 2015-05-13 DIAGNOSIS — D279 Benign neoplasm of unspecified ovary: Secondary | ICD-10-CM | POA: Diagnosis not present

## 2015-05-13 DIAGNOSIS — N800 Endometriosis of uterus: Secondary | ICD-10-CM | POA: Insufficient documentation

## 2015-05-13 DIAGNOSIS — D259 Leiomyoma of uterus, unspecified: Secondary | ICD-10-CM | POA: Diagnosis not present

## 2015-05-13 DIAGNOSIS — E210 Primary hyperparathyroidism: Secondary | ICD-10-CM | POA: Diagnosis not present

## 2015-05-13 DIAGNOSIS — N72 Inflammatory disease of cervix uteri: Secondary | ICD-10-CM | POA: Insufficient documentation

## 2015-05-13 DIAGNOSIS — K5909 Other constipation: Secondary | ICD-10-CM | POA: Insufficient documentation

## 2015-05-13 DIAGNOSIS — E7800 Pure hypercholesterolemia, unspecified: Secondary | ICD-10-CM | POA: Insufficient documentation

## 2015-05-13 DIAGNOSIS — N838 Other noninflammatory disorders of ovary, fallopian tube and broad ligament: Secondary | ICD-10-CM | POA: Insufficient documentation

## 2015-05-13 DIAGNOSIS — G629 Polyneuropathy, unspecified: Secondary | ICD-10-CM | POA: Diagnosis not present

## 2015-05-13 DIAGNOSIS — N86 Erosion and ectropion of cervix uteri: Secondary | ICD-10-CM | POA: Diagnosis not present

## 2015-05-13 DIAGNOSIS — M810 Age-related osteoporosis without current pathological fracture: Secondary | ICD-10-CM | POA: Insufficient documentation

## 2015-05-13 DIAGNOSIS — K219 Gastro-esophageal reflux disease without esophagitis: Secondary | ICD-10-CM | POA: Diagnosis not present

## 2015-05-13 DIAGNOSIS — M1990 Unspecified osteoarthritis, unspecified site: Secondary | ICD-10-CM | POA: Insufficient documentation

## 2015-05-13 DIAGNOSIS — M419 Scoliosis, unspecified: Secondary | ICD-10-CM | POA: Insufficient documentation

## 2015-05-13 DIAGNOSIS — J449 Chronic obstructive pulmonary disease, unspecified: Secondary | ICD-10-CM | POA: Insufficient documentation

## 2015-05-13 DIAGNOSIS — Z90710 Acquired absence of both cervix and uterus: Secondary | ICD-10-CM

## 2015-05-13 HISTORY — PX: VAGINAL HYSTERECTOMY: SHX2639

## 2015-05-13 HISTORY — PX: CYSTOCELE REPAIR: SHX163

## 2015-05-13 HISTORY — PX: CYSTOSCOPY: SHX5120

## 2015-05-13 LAB — POCT I-STAT 4, (NA,K, GLUC, HGB,HCT)
Glucose, Bld: 107 mg/dL — ABNORMAL HIGH (ref 65–99)
HEMATOCRIT: 40 % (ref 36.0–46.0)
Hemoglobin: 13.6 g/dL (ref 12.0–15.0)
Potassium: 3.4 mmol/L — ABNORMAL LOW (ref 3.5–5.1)
Sodium: 142 mmol/L (ref 135–145)

## 2015-05-13 SURGERY — HYSTERECTOMY, VAGINAL
Anesthesia: General

## 2015-05-13 MED ORDER — OXYCODONE-ACETAMINOPHEN 5-325 MG PO TABS
1.0000 | ORAL_TABLET | ORAL | Status: DC | PRN
  Administered 2015-05-13: 1 via ORAL
  Filled 2015-05-13: qty 1

## 2015-05-13 MED ORDER — PROPOFOL 10 MG/ML IV BOLUS
INTRAVENOUS | Status: DC | PRN
  Administered 2015-05-13: 150 mg via INTRAVENOUS

## 2015-05-13 MED ORDER — GLYCOPYRROLATE 0.2 MG/ML IJ SOLN
INTRAMUSCULAR | Status: DC | PRN
  Administered 2015-05-13: 0.2 mg via INTRAVENOUS

## 2015-05-13 MED ORDER — ONDANSETRON HCL 4 MG/2ML IJ SOLN
4.0000 mg | Freq: Once | INTRAMUSCULAR | Status: AC | PRN
  Administered 2015-05-13: 4 mg via INTRAVENOUS

## 2015-05-13 MED ORDER — SODIUM CHLORIDE 0.9 % IJ SOLN
INTRAMUSCULAR | Status: AC
  Filled 2015-05-13: qty 10

## 2015-05-13 MED ORDER — FENTANYL CITRATE (PF) 100 MCG/2ML IJ SOLN
25.0000 ug | INTRAMUSCULAR | Status: DC | PRN
  Administered 2015-05-13 (×3): 25 ug via INTRAVENOUS

## 2015-05-13 MED ORDER — BISACODYL 10 MG RE SUPP: 10.0000 mg | Freq: Every day | RECTAL | Status: DC | PRN

## 2015-05-13 MED ORDER — MORPHINE SULFATE (PF) 2 MG/ML IV SOLN: 1.0000 mg | INTRAVENOUS | Status: DC | PRN

## 2015-05-13 MED ORDER — LIDOCAINE HCL (CARDIAC) 20 MG/ML IV SOLN
INTRAVENOUS | Status: DC | PRN
  Administered 2015-05-13 (×2): 100 mg via INTRAVENOUS

## 2015-05-13 MED ORDER — ROCURONIUM BROMIDE 100 MG/10ML IV SOLN
INTRAVENOUS | Status: DC | PRN
  Administered 2015-05-13: 40 mg via INTRAVENOUS

## 2015-05-13 MED ORDER — DOCUSATE SODIUM 100 MG PO CAPS
100.0000 mg | ORAL_CAPSULE | Freq: Two times a day (BID) | ORAL | Status: DC
  Administered 2015-05-14: 100 mg via ORAL
  Filled 2015-05-13 (×2): qty 1

## 2015-05-13 MED ORDER — KETOROLAC TROMETHAMINE 30 MG/ML IJ SOLN: 15.0000 mg | Freq: Four times a day (QID) | INTRAMUSCULAR | Status: DC

## 2015-05-13 MED ORDER — LACTATED RINGERS IV SOLN
INTRAVENOUS | Status: DC
  Administered 2015-05-13 (×2): via INTRAVENOUS

## 2015-05-13 MED ORDER — FLUORESCEIN SODIUM 10 % IJ SOLN
INTRAMUSCULAR | Status: DC | PRN
  Administered 2015-05-13: 50 mg via INTRAVENOUS

## 2015-05-13 MED ORDER — DEXAMETHASONE SODIUM PHOSPHATE 4 MG/ML IJ SOLN
INTRAMUSCULAR | Status: DC | PRN
  Administered 2015-05-13: 5 mg via INTRAVENOUS

## 2015-05-13 MED ORDER — ONDANSETRON HCL 4 MG/2ML IJ SOLN
INTRAMUSCULAR | Status: AC
  Administered 2015-05-13: 4 mg via INTRAVENOUS
  Filled 2015-05-13: qty 2

## 2015-05-13 MED ORDER — ONDANSETRON HCL 4 MG/2ML IJ SOLN
INTRAMUSCULAR | Status: DC | PRN
  Administered 2015-05-13: 4 mg via INTRAVENOUS

## 2015-05-13 MED ORDER — SIMETHICONE 80 MG PO CHEW: 80.0000 mg | CHEWABLE_TABLET | Freq: Four times a day (QID) | ORAL | Status: DC | PRN

## 2015-05-13 MED ORDER — FENTANYL CITRATE (PF) 100 MCG/2ML IJ SOLN
INTRAMUSCULAR | Status: DC | PRN
  Administered 2015-05-13: 150 ug via INTRAVENOUS
  Administered 2015-05-13: 100 ug via INTRAVENOUS
  Administered 2015-05-13 (×2): 50 ug via INTRAVENOUS

## 2015-05-13 MED ORDER — LACTATED RINGERS IV SOLN: INTRAVENOUS | Status: DC

## 2015-05-13 MED ORDER — KETOROLAC TROMETHAMINE 30 MG/ML IJ SOLN
15.0000 mg | Freq: Four times a day (QID) | INTRAMUSCULAR | Status: DC
  Administered 2015-05-13 – 2015-05-14 (×4): 15 mg via INTRAVENOUS
  Filled 2015-05-13 (×4): qty 1

## 2015-05-13 MED ORDER — CEFAZOLIN SODIUM-DEXTROSE 2-4 GM/100ML-% IV SOLN
INTRAVENOUS | Status: AC
  Filled 2015-05-13: qty 100

## 2015-05-13 MED ORDER — CEFAZOLIN SODIUM-DEXTROSE 2-4 GM/100ML-% IV SOLN
2.0000 g | INTRAVENOUS | Status: AC
  Administered 2015-05-13: 2 g via INTRAVENOUS

## 2015-05-13 MED ORDER — NEOSTIGMINE METHYLSULFATE 10 MG/10ML IV SOLN
INTRAVENOUS | Status: DC | PRN
  Administered 2015-05-13: 1.5 mg via INTRAVENOUS

## 2015-05-13 MED ORDER — ACETAMINOPHEN 10 MG/ML IV SOLN
INTRAVENOUS | Status: AC
  Filled 2015-05-13: qty 100

## 2015-05-13 MED ORDER — ACETAMINOPHEN 10 MG/ML IV SOLN
INTRAVENOUS | Status: DC | PRN
  Administered 2015-05-13: 1000 mg via INTRAVENOUS

## 2015-05-13 MED ORDER — ESTROGENS, CONJUGATED 0.625 MG/GM VA CREA
TOPICAL_CREAM | VAGINAL | Status: DC | PRN
  Administered 2015-05-13: 1 via VAGINAL

## 2015-05-13 MED ORDER — LACTATED RINGERS IV SOLN
INTRAVENOUS | Status: DC
  Administered 2015-05-14 (×2): via INTRAVENOUS

## 2015-05-13 MED ORDER — MIDAZOLAM HCL 2 MG/2ML IJ SOLN
INTRAMUSCULAR | Status: DC | PRN
  Administered 2015-05-13: 1 mg via INTRAVENOUS

## 2015-05-13 MED ORDER — ESTROGENS, CONJUGATED 0.625 MG/GM VA CREA
TOPICAL_CREAM | VAGINAL | Status: AC
  Filled 2015-05-13: qty 30

## 2015-05-13 MED ORDER — PHENYLEPHRINE HCL 10 MG/ML IJ SOLN
INTRAMUSCULAR | Status: DC | PRN
  Administered 2015-05-13: 7.5 ug via INTRAVENOUS
  Administered 2015-05-13: 100 ug via INTRAVENOUS

## 2015-05-13 MED ORDER — FENTANYL CITRATE (PF) 100 MCG/2ML IJ SOLN
INTRAMUSCULAR | Status: AC
  Administered 2015-05-13: 25 ug via INTRAVENOUS
  Filled 2015-05-13: qty 2

## 2015-05-13 MED ORDER — ACETAMINOPHEN 325 MG PO TABS: 650.0000 mg | ORAL_TABLET | ORAL | Status: DC | PRN

## 2015-05-13 MED ORDER — PROMETHAZINE HCL 25 MG/ML IJ SOLN
6.2500 mg | INTRAMUSCULAR | Status: DC | PRN
Start: 2015-05-13 — End: 2015-05-13
  Administered 2015-05-13: 6.25 mg via INTRAVENOUS
  Filled 2015-05-13 (×2): qty 1

## 2015-05-13 SURGICAL SUPPLY — 35 items
BAG URO DRAIN 2000ML W/SPOUT (MISCELLANEOUS) ×4 IMPLANT
CANISTER SUCT 1200ML W/VALVE (MISCELLANEOUS) ×4 IMPLANT
CATH FOLEY 2WAY  5CC 16FR (CATHETERS) ×1
CATH URTH 16FR FL 2W BLN LF (CATHETERS) ×3 IMPLANT
DRAPE PERI LITHO V/GYN (MISCELLANEOUS) ×4 IMPLANT
DRAPE SHEET LG 3/4 BI-LAMINATE (DRAPES) ×4 IMPLANT
DRAPE UNDER BUTTOCK W/FLU (DRAPES) ×4 IMPLANT
ELECT REM PT RETURN 9FT ADLT (ELECTROSURGICAL) ×4
ELECTRODE REM PT RTRN 9FT ADLT (ELECTROSURGICAL) ×3 IMPLANT
GAUZE PACK 2X3YD (MISCELLANEOUS) IMPLANT
GLOVE BIO SURGEON STRL SZ8 (GLOVE) ×16 IMPLANT
GLOVE INDICATOR 8.0 STRL GRN (GLOVE) ×16 IMPLANT
GOWN STRL REUS W/ TWL LRG LVL3 (GOWN DISPOSABLE) ×6 IMPLANT
GOWN STRL REUS W/ TWL XL LVL3 (GOWN DISPOSABLE) ×6 IMPLANT
GOWN STRL REUS W/TWL LRG LVL3 (GOWN DISPOSABLE) ×2
GOWN STRL REUS W/TWL XL LVL3 (GOWN DISPOSABLE) ×2
KIT RM TURNOVER CYSTO AR (KITS) ×4 IMPLANT
LABEL OR SOLS (LABEL) ×4 IMPLANT
NS IRRIG 500ML POUR BTL (IV SOLUTION) ×4 IMPLANT
PACK BASIN MINOR ARMC (MISCELLANEOUS) ×4 IMPLANT
PAD OB MATERNITY 4.3X12.25 (PERSONAL CARE ITEMS) ×4 IMPLANT
PAD PREP 24X41 OB/GYN DISP (PERSONAL CARE ITEMS) ×4 IMPLANT
SOL PREP PVP 2OZ (MISCELLANEOUS) ×4
SOLUTION PREP PVP 2OZ (MISCELLANEOUS) ×3 IMPLANT
SPONGE XRAY 4X4 16PLY STRL (MISCELLANEOUS) ×4 IMPLANT
SUT CHROMIC 0 CT 1 (SUTURE) IMPLANT
SUT CHROMIC 1-0 (SUTURE) IMPLANT
SUT CHROMIC 2 0 CT 1 (SUTURE) ×20 IMPLANT
SUT VIC AB 0 CT1 27 (SUTURE) ×3
SUT VIC AB 0 CT1 27XCR 8 STRN (SUTURE) ×9 IMPLANT
SUT VIC AB 0 CT1 36 (SUTURE) ×8 IMPLANT
SUT VIC AB 0 CT2 27 (SUTURE) IMPLANT
SUT VIC AB 2-0 UR6 27 (SUTURE) ×16 IMPLANT
SUT VIC AB 4-0 PS2 18 (SUTURE) ×4 IMPLANT
SYRINGE 10CC LL (SYRINGE) ×4 IMPLANT

## 2015-05-13 NOTE — Anesthesia Preprocedure Evaluation (Addendum)
Anesthesia Evaluation  Patient identified by MRN, date of birth, ID band Patient awake    Reviewed: Allergy & Precautions, NPO status , Patient's Chart, lab work & pertinent test results, reviewed documented beta blocker date and time   Airway Mallampati: II  TM Distance: >3 FB     Dental  (+) Chipped   Pulmonary shortness of breath, COPD, former smoker,           Cardiovascular hypertension, Pt. on medications + dysrhythmias      Neuro/Psych PSYCHIATRIC DISORDERS Anxiety Depression  Neuromuscular disease    GI/Hepatic GERD  ,  Endo/Other    Renal/GU      Musculoskeletal  (+) Arthritis ,   Abdominal   Peds  Hematology  (+) anemia ,   Anesthesia Other Findings Dr. Lanny Hurst. Good neck movement.  Reproductive/Obstetrics                            Anesthesia Physical Anesthesia Plan  ASA: III  Anesthesia Plan: General   Post-op Pain Management:    Induction: Intravenous  Airway Management Planned: Oral ETT  Additional Equipment:   Intra-op Plan:   Post-operative Plan:   Informed Consent: I have reviewed the patients History and Physical, chart, labs and discussed the procedure including the risks, benefits and alternatives for the proposed anesthesia with the patient or authorized representative who has indicated his/her understanding and acceptance.     Plan Discussed with: CRNA  Anesthesia Plan Comments:         Anesthesia Quick Evaluation

## 2015-05-13 NOTE — Anesthesia Procedure Notes (Signed)
Procedure Name: Intubation Date/Time: 05/13/2015 7:49 AM Performed by: Rosaria Ferries, Abrey Bradway Pre-anesthesia Checklist: Patient identified, Emergency Drugs available, Suction available and Patient being monitored Oxygen Delivery Method: Circle system utilized Preoxygenation: Pre-oxygenation with 100% oxygen Intubation Type: IV induction Laryngoscope Size: Mac and 3 Grade View: Grade I Tube size: 7.0 mm Number of attempts: 1 Placement Confirmation: ETT inserted through vocal cords under direct vision,  positive ETCO2 and breath sounds checked- equal and bilateral Secured at: 22 cm Tube secured with: Tape Dental Injury: Teeth and Oropharynx as per pre-operative assessment

## 2015-05-13 NOTE — Anesthesia Postprocedure Evaluation (Signed)
Anesthesia Post Note  Patient: [name]  Procedure(s) Performed: Procedure(s) (LRB): HYSTERECTOMY VAGINAL WITH BILATERAL SALPINGO OOPHERECTOMY (Bilateral) ANTERIOR REPAIR (CYSTOCELE) (N/A) CYSTOSCOPY  Patient location during evaluation: PACU Anesthesia Type: General Level of consciousness: awake and alert Pain management: pain level controlled Vital Signs Assessment: post-procedure vital signs reviewed and stable Respiratory status: spontaneous breathing, nonlabored ventilation, respiratory function stable and patient connected to nasal cannula oxygen Cardiovascular status: blood pressure returned to baseline and stable Postop Assessment: no signs of nausea or vomiting Anesthetic complications: no    Last Vitals:  Filed Vitals:   05/13/15 1234 05/13/15 1333  BP: 130/98 124/79  Pulse: 60 68  Temp: 36.4 C 36.3 C  Resp: 15 18    Last Pain:  Filed Vitals:   05/13/15 1335  PainSc: [place]

## 2015-05-13 NOTE — Interval H&P Note (Signed)
History and Physical Interval Note:  05/13/2015 7:32 AM  [name]  has presented today for surgery, with the diagnosis of Procidentia of uterus, cystocele, rectocele, ulcer of cervix,urinary retention  The various methods of treatment have been discussed with the patient and family. After consideration of risks, benefits and other options for treatment, the patient has consented to  Procedure(s): HYSTERECTOMY VAGINAL WITH BILATERAL SALPINGO OOPHERECTOMY (Bilateral) ANTERIOR REPAIR (CYSTOCELE) (N/A) as a surgical intervention .  The patient's history has been reviewed, patient examined, no change in status, stable for surgery.  I have reviewed the patient's chart and labs.  Questions were answered to the patient's satisfaction.     Hassell Done A Farin Buhman

## 2015-05-13 NOTE — Transfer of Care (Signed)
Immediate Anesthesia Transfer of Care Note  Patient: [name]  Procedure(s) Performed: Procedure(s): HYSTERECTOMY VAGINAL WITH BILATERAL SALPINGO OOPHERECTOMY (Bilateral) ANTERIOR REPAIR (CYSTOCELE) (N/A) CYSTOSCOPY  Patient Location: PACU  Anesthesia Type:General  Level of Consciousness: awake  Airway & Oxygen Therapy: Patient Spontanous Breathing and Patient connected to nasal cannula oxygen  Post-op Assessment: Report given to RN and Post -op Vital signs reviewed and stable  Post vital signs: Reviewed and stable  Last Vitals:  Filed Vitals:   05/13/15 0647  BP: 145/96  Pulse: 59  Temp: 36.3 C  Resp: 18    Complications: No apparent anesthesia complications

## 2015-05-13 NOTE — H&P (View-Only) (Signed)
Subjective:    PREOPERATIVE HISTORY AND PHYSICAL    Date of surgery: 05/13/2015 Chief complaint: 1. Uterine procidentia; third-degree cystocele; rectocele    Patient is a 70 y.o. G1P1068female scheduled for TVH BSO with anterior colporrhaphy due to uterine procidentia and third-degree cystocele   Gynecologic History No LMP recorded. Patient is postmenopausal. Contraception: post menopausal status  Menopause age 25. HRT use until the age of 42, Was using Estrace cream but d/c 2 days ago due to difficulty with application No history of abnormal Pap smears. No history of STI Patient noticed mild bleeding in shower, likely due to cervical ulceration  GU symptoms: Urinary retention Difficulty emptying bladder  Flank pain  Lower abdominal pressure No stress urinary incontinence. History of occasional UTI with out any infection in the past several years.  GI symptoms: History of chronic constipation. Last BM yesterday  Patient does Juicing In order to be regular   Menstrual History: OB History    Gravida Para Term Preterm AB TAB SAB Ectopic Multiple Living   1 1 1       1      No LMP recorded. Patient is postmenopausal.    Past Medical History  Diagnosis Date  . Hypertension   . Anxiety   . Depression   . GERD (gastroesophageal reflux disease)   . Hypercholesterolemia   . Chronic insomnia   . COPD (chronic obstructive pulmonary disease) ([place])     documented in outside records  . Arthritis   . Heart murmur   . Osteoporosis   . Shortness of breath     with exertion  . Tingling     both hands  . History of stomach ulcers   . Hyperparathyroidism, primary ([place])   . Dysrhythmia     PALPITATIONS   . Complication of anesthesia     "AFFECTS MY MEMORY"  . Burning feet syndrome   . Neuropathy ([place])   . Anemia   . HOH (hard of hearing)     AIDS  . Scoliosis   . Palpitations   . Edema     MILD ANKLE    Past Surgical History  Procedure Laterality Date  .  Dilation and curettage of uterus      X2   . Pituitary surgery      FOR TUMOR 3 YRS   . Joint replacement      RT HIP  2013  (PARTIAL)   . Breast surgery      RT BREAST CYST REMOVED 15 YRS AGO   . Anterior cervical decomp/discectomy fusion  10/28/2011    Procedure: ANTERIOR CERVICAL DECOMPRESSION/DISCECTOMY FUSION 2 LEVELS;  Surgeon: Elaina Hoops, MD;  Location: [facility];  Service: Neurosurgery;  Laterality: N/A;  . Parathyroidectomy Left 08/25/2012    Procedure: PARATHYROIDECTOMY;  Surgeon: Earnstine Regal, MD;  Location: [facility];  Service: General;  Laterality: Left;  . Total knee arthroplasty Right 07/18/2014    Procedure: TOTAL KNEE ARTHROPLASTY;  Surgeon: Dereck Leep, MD;  Location: [facility];  Service: Orthopedics;  Laterality: Right;  . Breast biopsy Right 1992    neg  . Cataract extraction w/phaco Right 10/30/2014    Procedure: CATARACT EXTRACTION PHACO AND INTRAOCULAR LENS PLACEMENT (IOC);  Surgeon: Birder Robson, MD;  Location: [facility];  Service: Ophthalmology;  Laterality: Right;  US0:40 AP18.2 CDE7.42  [place] [id number]  . Cataract extraction w/phaco Left 11/27/2014    Procedure: CATARACT EXTRACTION PHACO AND INTRAOCULAR LENS PLACEMENT (IOC);  Surgeon: Birder Robson, MD;  Location: [facility];  Service: Ophthalmology;  Laterality: Left;  [place] 00:36.9 AP%: 18.9 CDE: 6.96  [place] #  [id number]    OB History  Gravida Para Term Preterm AB SAB TAB Ectopic Multiple Living  1 1 1       1     # Outcome Date GA Lbr Len/2nd Weight Sex Delivery Anes PTL Lv  1 Term 1978   8 lb 2.1 oz (3.688 kg) M Vag-Spont   Y      Social History   Social History  . Marital Status: Divorced    Spouse Name: N/A  . Number of Children: N/A  . Years of Education: N/A   Social History Main Topics  . Smoking status: Former Smoker    Quit date: 02/10/2008  . Smokeless tobacco: Never Used  . Alcohol Use: No  . Drug Use: No  . Sexual Activity: Not Currently    Birth Control/  Protection: Post-menopausal   Other Topics Concern  . None   Social History Narrative    Family History  Problem Relation Age of Onset  . Glaucoma Mother   . Hypertension Mother   . Thyroid disease Mother   . Stroke Father   . Hypertension Father   . Cancer Neg Hx   . Diabetes Neg Hx      (Not in a hospital admission)  Allergies  Allergen Reactions  . Citalopram Hives    Review of Systems Constitutional: No recent fever/chills/sweats Respiratory: No recent cough/bronchitis Cardiovascular: No chest pain Gastrointestinal: No recent nausea/vomiting/diarrhea Genitourinary: No UTI symptoms Hematologic/lymphatic:No history of coagulopathy or recent blood thinner use    Objective:    BP 116/73 mmHg  Pulse 64  Ht 5\' 6"  (1.676 m)  Wt 166 lb 3.2 oz (75.388 kg)  BMI 26.84 kg/m2  General:   Normal  Skin:   normal  HEENT:  Normal  Neck:  Supple without Adenopathy or Thyromegaly  Lungs:   Heart:              Breasts:   Abdomen:  Pelvis:  M/S   Extremeties:  Neuro:    clear to auscultation bilaterally   Normal without murmur   Not Examined   soft, non-tender; bowel sounds normal; no masses,  no organomegaly   Exam deferred to OR  No CVAT  Warm/Dry   Normal      PELVIC: External Genitalia: Normal BUS: Normal Vagina: atrophic; prolapsing through the introitus; third-degree cystocele; mild rectocele Cervix:1 cm healing ulcer at 6:00 on the cervix, appears traumatic likely from intravaginal ERT application Uterus: procidentia; mobile, nontender, normal size and shape Adnexa: Normal RV: Normal external exam; internal exam demonstrates normal sphincter tone, no rectal masses, and mild rectocele Bladder: Nontender     Assessment:    1. Symptomatic pelvic relaxation   Plan:  1. TVH BSO with anterior/posterior colporrhaphy  Preoperative counseling: The  patient is to undergo TVH BSO with anterior/posterior colporrhaphy on 05/13/2015. She is understanding of the planned procedure and is aware of and is accepting of all surgical risks which include but are not limited to bleeding, infection, pelvic organ injury with need for repair, blood clot disorders, anesthesia risks, etc. All questions have been answered. Informed consent is given. Patient is ready and willing to proceed with surgery as scheduled.  Brayton Mars, MD  Note: This dictation was prepared with Dragon dictation along with smaller phrase technology. Any transcriptional errors that result from this process are unintentional.

## 2015-05-13 NOTE — Op Note (Signed)
OPERATIVE NOTE:  [name] PROCEDURE DATE: 05/13/2015   PREOPERATIVE DIAGNOSIS:  1. Uterine procidentia 2. Third-degree cystocele POSTOPERATIVE DIAGNOSIS: Same as above and patent ureters PROCEDURE: TVH BSO with anterior colporrhaphy, Kelly plication, and cystoscopy  SURGEON:  Brayton Mars, MD ASSISTANTS: Dr. Marcelline Mates ANESTHESIA: General INDICATIONS: 70 y.o. G1P1001 with uterine procidentia and third-degree cystourethrocele, presents for definitive surgery  FINDINGS:   1. Multi-fibroid uterus 2. Third-degree cystourethrocele 3. Patent ureters on cystoscopy   I/O's: Total I/O In: 1000 [I.V.:1000] Out: 350 [Blood:350] COUNTS:  YES SPECIMENS: Uterus with cervix, bilateral fallopian tubes and ovaries ANTIBIOTIC PROPHYLAXIS:Ancef 2 grams COMPLICATIONS: None immediate  PROCEDURE IN DETAIL:  TVH BSO: Patient was brought to the operating room versus placed in the supine position. General endotracheal anesthesia was induced without difficulty. She was placed in the dorsal lithotomy position using candycane stirrups. A Betadine perineal intravaginal prep and drape was performed in standard fashion. Foley catheter was placed and was draining clear yellow urine. Double-tooth tenaculum was placed on the cervix. Posterior colpotomy was made with Mayo scissors. Uterosacral ligaments were clamped cut and stick tied using 0 Vicryl suture. These were tagged. The cervix was circumscribed with the Bovie cautery. The bladder was dissected off the lower uterine segment through sharp and blunt dissection. Eventually the anterior cul-de-sac was entered. The cardinal broad ligament complexes were sequentially clamped cut and stick tied using 0 Vicryl suture. This was carried out to the level of the utero-ovarian ligaments which were then cross clamped cut and stick tied. The specimen was removed from the operative field. Sequentially the adnexal structures were identified. A curved Heaney clamp  was used to cross-clamp the infundibulopelvic ligament. The adnexal structures were then excised with scissors. A free tie was placed on each pedicle followed by a stick tie of 0 Vicryl suture. Good hemostasis was noted. The posterior vaginal mucosa was run with a baseball stitch of 0 Vicryl.  Anterior colporrhaphy: The angles of the vaginal mucosa were grasped with Allis clamps. The vaginal mucosa was undermined with Metzenbaum scissors and then incised in the midline. Allis Adair clamps were used to grasp the vaginal mucosa. This technique was carried out to within 1 cm of the urethral meatus. Next the bladder was dissected off the vaginal mucosa through sharp and blunt dissection. Following adequate mobilization, Kelly plication stitches were placed using 2-0 Vicryl suture at the urethrovesical junction. This was followed by the use of vertical mattress sutures are sutures of 2-0 Vicryl to reduce the cystocele. Following reduction of the cystocele, the vaginal mucosa was trimmed and the vagina was reapproximated midline using 2-0 chromic sutures in a simple interrupted manner.  The uterosacral ligaments were cross tied.  Cystoscopy: The Foley catheter was removed. The 30 cystoscope was used with lactated Ringer's as irrigant to fill the bladder with several 100 cc of fluid. 0.5 cc of fluorescein was given intravenously. Eventually bilateral ureteral jets were noted of fluorescein dye. The left ureteral orifice was adequately visualized. The right ureteral orifice was difficult to see over the bridge of tissue but was clearly demonstrating a clear jet. The procedure was terminated with the cystoscope being removed. The Foley catheter was replaced. The  The vagina was packed with Premarin coated gauze.The patient was then awakened mobilized and taken to recovery in satisfactory condition.   Abrahan Fulmore A. Zipporah Plants, MD, ACOG [facility]

## 2015-05-14 DIAGNOSIS — N813 Complete uterovaginal prolapse: Secondary | ICD-10-CM | POA: Diagnosis not present

## 2015-05-14 LAB — HEMOGLOBIN: HEMOGLOBIN: 10.6 g/dL — AB (ref 12.0–16.0)

## 2015-05-14 MED ORDER — OXYCODONE-ACETAMINOPHEN 5-325 MG PO TABS: 1.0000 | ORAL_TABLET | ORAL | Status: DC | PRN

## 2015-05-14 NOTE — Progress Notes (Signed)
Discharge instructions given and reviewed with patient.  Patient to follow up with D.DeFrancesco on 05/21/15 at 3:30 pm.  Discharge via wheelchair, Son present.

## 2015-05-14 NOTE — Discharge Summary (Signed)
Physician Discharge Summary  Patient ID: [name] MRN: [id number] DOB/AGE: 68/68/1947 68 y.o.  Admit date: 05/13/2015 Discharge date: 05/14/2015  Admission Diagnoses: Procidentia; third-degree cystocele  Discharge Diagnoses:  Procidentia; third-degree cystocele; status post TVH BSO with anterior colporrhaphy and cystoscopy  Operative Procedures: Procedure(s): HYSTERECTOMY VAGINAL WITH BILATERAL SALPINGO OOPHERECTOMY (Bilateral) ANTERIOR REPAIR (CYSTOCELE) (N/A) CYSTOSCOPY  Hospital Course: Uncomplicated   Significant Diagnostic Studies:  Lab Results  Component Value Date   HGB 10.6* 05/14/2015   HGB 13.6 05/13/2015   HGB 12.5 05/08/2015   Lab Results  Component Value Date   HCT 40.0 05/13/2015   HCT 36.3 05/08/2015   HCT 41.4 04/16/2015   CBC Latest Ref Rng 05/14/2015 05/13/2015 05/08/2015  WBC 3.6 - 11.0 K/uL - - 7.7  Hemoglobin 12.0 - 16.0 g/dL 10.6(L) 13.6 12.5  Hematocrit 36.0 - 46.0 % - 40.0 36.3  Platelets 150 - 440 K/uL - - 350     Discharged Condition: good  Discharge Exam: Blood pressure 162/69, pulse 71, temperature 98.6 F (37 C), temperature source Oral, resp. rate 18, height 5\' 6"  (1.676 m), weight 166 lb (75.297 kg), SpO2 97 %. Incision/Wound: None  Disposition: 01-Home or Self Care      Discharge Instructions    Discharge patient    Complete by:  As directed      Discharge patient    Complete by:  As directed             Medication List    STOP taking these medications        potassium chloride 8 MEQ tablet  Commonly known as:  KLOR-CON      TAKE these medications        acetaminophen 500 MG tablet  Commonly known as:  TYLENOL  Take 1,000 mg by mouth every 6 (six) hours as needed for fever.     amLODipine 5 MG tablet  Commonly known as:  NORVASC  TAKE 1 TABLET (5 MG TOTAL) BY MOUTH DAILY.     aspirin EC 81 MG tablet  Take 81 mg by mouth daily.     atorvastatin 40 MG tablet  Commonly known as:  LIPITOR  Take 1 tablet (40  mg total) by mouth daily.     BIOTIN MAXIMUM STRENGTH 10 MG Tabs  Generic drug:  Biotin  Take 1 tablet by mouth daily.     chlorthalidone 25 MG tablet  Commonly known as:  HYGROTON  TAKE 1 TABLET (25 MG TOTAL) BY MOUTH DAILY.     cholecalciferol 1000 units tablet  Commonly known as:  VITAMIN D  Take 1,000 Units by mouth daily.     docusate sodium 100 MG capsule  Commonly known as:  COLACE  Take 100 mg by mouth 2 (two) times daily as needed for mild constipation.     ferrous sulfate 325 (65 FE) MG tablet  Take 325 mg by mouth daily.     LYRICA 50 MG capsule  Generic drug:  pregabalin  50 mg 3 (three) times daily.     magnesium oxide 400 MG tablet  Commonly known as:  MAG-OX  Take 400 mg by mouth daily.     MULTIVITAMIN PO  Take 1 tablet by mouth daily.     OMEGA 3 PO  Take 1 capsule by mouth 3 (three) times a week.     oxyCODONE-acetaminophen 5-325 MG tablet  Commonly known as:  PERCOCET/ROXICET  Take 1-2 tablets by mouth every 4 (four) hours as needed (moderate  to severe pain (when tolerating fluids)).     pantoprazole 40 MG tablet  Commonly known as:  PROTONIX  TAKE ONE TABLET BY MOUTH TWICE DAILY     potassium chloride 10 MEQ tablet  Commonly known as:  KLOR-CON M10  Take 1 tablet (10 mEq total) by mouth daily.     ranitidine 150 MG tablet  Commonly known as:  ZANTAC  Take 150 mg by mouth 2 (two) times daily.     REFRESH TEARS OP  Apply 1 drop to eye as needed (for dry eyes).     valsartan 320 MG tablet  Commonly known as:  DIOVAN  TAKE 1 TABLET (320 MG TOTAL) BY MOUTH DAILY.       Follow-up Information    Follow up with Brayton Mars, MD. Schedule an appointment as soon as possible for a visit in 1 day.   Specialties:  Obstetrics and Gynecology, Radiology   Why:  Post Op Check   Contact information:   [address] [place] [address] [phone]       Follow up with Brayton Mars, MD In 1 week.   Specialties:   Obstetrics and Gynecology, Radiology   Why:  Post Op Check   Contact information:   [address] [place] [address] [phone]       Signed: Alanda Slim Dreon Pineda 05/14/2015, 11:56 AM

## 2015-05-15 DIAGNOSIS — Z90710 Acquired absence of both cervix and uterus: Secondary | ICD-10-CM

## 2015-05-15 LAB — SURGICAL PATHOLOGY

## 2015-05-21 ENCOUNTER — Encounter: Payer: Self-pay | Admitting: Obstetrics and Gynecology

## 2015-05-21 ENCOUNTER — Ambulatory Visit (INDEPENDENT_AMBULATORY_CARE_PROVIDER_SITE_OTHER): Payer: Medicare Other | Admitting: Obstetrics and Gynecology

## 2015-05-21 VITALS — BP 106/68 | HR 62 | Ht 66.0 in | Wt 162.9 lb

## 2015-05-21 DIAGNOSIS — N8110 Cystocele, unspecified: Secondary | ICD-10-CM

## 2015-05-21 DIAGNOSIS — IMO0002 Reserved for concepts with insufficient information to code with codable children: Secondary | ICD-10-CM

## 2015-05-21 DIAGNOSIS — Z09 Encounter for follow-up examination after completed treatment for conditions other than malignant neoplasm: Secondary | ICD-10-CM

## 2015-05-21 DIAGNOSIS — N813 Complete uterovaginal prolapse: Secondary | ICD-10-CM

## 2015-05-21 NOTE — Progress Notes (Signed)
Chief complaint:  1. 1 week postop check 2. Status post TVH BSO with anterior colporrhaphy   Patient presents for 1 week postop check. She is doing well with normal bowel and bladder function. She is not needing any significant analgesics for pain relief.   PATHOLOGY: DIAGNOSIS:  A. UTERUS WITH CERVIX, BILATERAL FALLOPIAN TUBES AND OVARIES;  HYSTERECTOMY WITH BILATERAL SALPINGO-OOPHORECTOMY:  - MINIMAL ACUTE AND CHRONIC CERVICITIS.  - TWO ENDOMETRIAL POLYPS, EACH 0.6 CM.  - ADENOMYOSIS.  - BACKGROUND PROLIFERATIVE ENDOMETRIUM.  - LEIOMYOMATA, UP TO 2.8 CM WITH HYALINIZATION AND CALCIFICATION;  NEGATIVE FOR ATYPIA, NECROSIS AND INCREASED MITOSES.  - BILATERAL OVARIES WITH AGE RELATED CHANGE.  - LEIOMYOMA OF OVARY, 1.0 CM.  - UNILATERAL FALLOPIAN TUBES WITHOUT PATHOLOGIC CHANGE.  - REMAINING FALLOPIAN TUBE WITH PARATUBAL CYST.   OBJECTIVE: BP 106/68 mmHg  Pulse 62  Ht 5\' 6"  (1.676 m)  Wt 162 lb 14.4 oz (73.891 kg)  BMI 26.31 kg/m2 Physical exam-deferred  ASSESSMENT: 1. Normal 1 week postop check 2. Status post TVH BSO with anterior colporrhaphy   PLAN: 1. Return in 5 weeks for final postop check 2. Continue with postoperative precautions  Brayton Mars, MD  Note: This dictation was prepared with Dragon dictation along with smaller phrase technology. Any transcriptional errors that result from this process are unintentional.

## 2015-05-21 NOTE — Patient Instructions (Signed)
1. Return in 5 weeks for postop check

## 2015-06-04 ENCOUNTER — Other Ambulatory Visit: Payer: Self-pay | Admitting: Internal Medicine

## 2015-06-24 ENCOUNTER — Other Ambulatory Visit: Payer: Self-pay | Admitting: Internal Medicine

## 2015-06-26 DIAGNOSIS — M3501 Sicca syndrome with keratoconjunctivitis: Secondary | ICD-10-CM | POA: Diagnosis not present

## 2015-06-27 ENCOUNTER — Encounter: Payer: Self-pay | Admitting: Obstetrics and Gynecology

## 2015-06-27 ENCOUNTER — Ambulatory Visit (INDEPENDENT_AMBULATORY_CARE_PROVIDER_SITE_OTHER): Payer: Medicare Other | Admitting: Obstetrics and Gynecology

## 2015-06-27 VITALS — BP 99/65 | HR 69 | Ht 66.0 in | Wt 161.0 lb

## 2015-06-27 DIAGNOSIS — N8003 Adenomyosis of the uterus: Secondary | ICD-10-CM | POA: Insufficient documentation

## 2015-06-27 DIAGNOSIS — N800 Endometriosis of uterus: Secondary | ICD-10-CM

## 2015-06-27 DIAGNOSIS — N809 Endometriosis, unspecified: Secondary | ICD-10-CM

## 2015-06-27 DIAGNOSIS — Z90710 Acquired absence of both cervix and uterus: Secondary | ICD-10-CM

## 2015-06-27 DIAGNOSIS — Z09 Encounter for follow-up examination after completed treatment for conditions other than malignant neoplasm: Secondary | ICD-10-CM

## 2015-06-27 NOTE — Patient Instructions (Signed)
1. Resume all activities without restriction. 2. Return in 6 months for follow up.  Q`

## 2015-06-27 NOTE — Progress Notes (Signed)
Chief complaint: 1. Final postop check 2. Status post TVH BSO with anterior colporrhaphy, Kelly plication, and cystoscopy  Presents for final postop check. Bowel and bladder function are normal. She is not experiencing any significant pelvic pain. She does not report any vaginal discharge or bleeding. She has gradually resume mild exercise.  Past medical history, past surgical history, problem list, medications, and allergies are reviewed  OBJECTIVE: BP 99/65 mmHg  Pulse 69  Ht 5\' 6"  (1.676 m)  Wt 161 lb (73.029 kg)  BMI 26.00 kg/m2 Pleasant white female in no acute distress Abdomen: Soft, nontender, without organomegaly Pelvic exam: External genitalia normal BUS-normal Vagina-good vault support; suture lines intact; vaginal cuff intact Cervix-surgically absent Uterus surgically absent Adnexa-nonpalpable nontender rectovaginal-normal external exam  PATHOLOGY: DIAGNOSIS:  A. UTERUS WITH CERVIX, BILATERAL FALLOPIAN TUBES AND OVARIES;  HYSTERECTOMY WITH BILATERAL SALPINGO-OOPHORECTOMY:  - MINIMAL ACUTE AND CHRONIC CERVICITIS.  - TWO ENDOMETRIAL POLYPS, EACH 0.6 CM.  - ADENOMYOSIS.  - BACKGROUND PROLIFERATIVE ENDOMETRIUM.  - LEIOMYOMATA, UP TO 2.8 CM WITH HYALINIZATION AND CALCIFICATION;  NEGATIVE FOR ATYPIA, NECROSIS AND INCREASED MITOSES.  - BILATERAL OVARIES WITH AGE RELATED CHANGE.  - LEIOMYOMA OF OVARY, 1.0 CM.  - UNILATERAL FALLOPIAN TUBES WITHOUT PATHOLOGIC CHANGE.  - REMAINING FALLOPIAN TUBE WITH PARATUBAL CYST.    ASSESSMENT: 1. Normal postop check 6 weeks status post TVH BSO with anterior colporrhaphy, Kelly plication, and cystoscopy 2. Adenomyosis, uterine fibroids  PLAN: 1. Resume all activities without restriction 2. Return in 6 months for follow-up  Brayton Mars, MD  Note: This dictation was prepared with Dragon dictation along with smaller phrase technology. Any transcriptional errors that result from this process are  unintentional.

## 2015-07-09 ENCOUNTER — Encounter: Payer: Self-pay | Admitting: Internal Medicine

## 2015-07-09 ENCOUNTER — Telehealth: Payer: Self-pay | Admitting: Internal Medicine

## 2015-07-09 ENCOUNTER — Ambulatory Visit (INDEPENDENT_AMBULATORY_CARE_PROVIDER_SITE_OTHER): Payer: Medicare Other | Admitting: Internal Medicine

## 2015-07-09 VITALS — BP 90/60 | HR 66 | Temp 97.8°F | Resp 18 | Ht 66.0 in | Wt 159.5 lb

## 2015-07-09 DIAGNOSIS — M5442 Lumbago with sciatica, left side: Secondary | ICD-10-CM

## 2015-07-09 DIAGNOSIS — I10 Essential (primary) hypertension: Secondary | ICD-10-CM | POA: Diagnosis not present

## 2015-07-09 DIAGNOSIS — R739 Hyperglycemia, unspecified: Secondary | ICD-10-CM

## 2015-07-09 DIAGNOSIS — M5441 Lumbago with sciatica, right side: Secondary | ICD-10-CM

## 2015-07-09 DIAGNOSIS — F419 Anxiety disorder, unspecified: Secondary | ICD-10-CM | POA: Diagnosis not present

## 2015-07-09 DIAGNOSIS — E7800 Pure hypercholesterolemia, unspecified: Secondary | ICD-10-CM

## 2015-07-09 DIAGNOSIS — G629 Polyneuropathy, unspecified: Secondary | ICD-10-CM

## 2015-07-09 DIAGNOSIS — Z1239 Encounter for other screening for malignant neoplasm of breast: Secondary | ICD-10-CM | POA: Diagnosis not present

## 2015-07-09 NOTE — Progress Notes (Signed)
Patient ID: [name], female   DOB: 1945/10/17, 70 y.o.   MRN: [id number]   Subjective:    Patient ID: [name], female    DOB: 1945/11/02, 70 y.o.   MRN: [id number]  HPI  Patient here for a scheduled follow up.   She recently had TVH with BSO with anterior colporrhaphy.  Doing well from her surgery.  She is having increased low back pain and leg pain.  Saw neurology.  They recommended starting effexor.  See note.  Off lyrica.  Taking gabapentin.  No chest pain.  Breathing stable.  No acid reflux.  No abdominal pain or cramping.  Bowels stable.     Past Medical History  Diagnosis Date  . Hypertension   . Anxiety   . Depression   . GERD (gastroesophageal reflux disease)   . Hypercholesterolemia   . Chronic insomnia   . COPD (chronic obstructive pulmonary disease) ([place])     documented in outside records  . Arthritis   . Heart murmur   . Osteoporosis   . Shortness of breath     with exertion  . Tingling     both hands  . History of stomach ulcers   . Hyperparathyroidism, primary ([place])   . Dysrhythmia     PALPITATIONS   . Complication of anesthesia     "AFFECTS MY MEMORY"  . Neuropathy ([place])   . Anemia   . HOH (hard of hearing)     AIDS  . Scoliosis   . Palpitations   . Edema     MILD ANKLE  . Burning feet syndrome    Past Surgical History  Procedure Laterality Date  . Dilation and curettage of uterus      X2   . Pituitary surgery      FOR TUMOR 3 YRS   . Breast surgery      RT BREAST CYST REMOVED 15 YRS AGO   . Anterior cervical decomp/discectomy fusion  10/28/2011    Procedure: ANTERIOR CERVICAL DECOMPRESSION/DISCECTOMY FUSION 2 LEVELS;  Surgeon: Elaina Hoops, MD;  Location: [facility];  Service: Neurosurgery;  Laterality: N/A;  . Parathyroidectomy Left 08/25/2012    Procedure: PARATHYROIDECTOMY;  Surgeon: Earnstine Regal, MD;  Location: [facility];  Service: General;  Laterality: Left;  . Total knee arthroplasty Right 07/18/2014    Procedure: TOTAL KNEE  ARTHROPLASTY;  Surgeon: Dereck Leep, MD;  Location: [facility];  Service: Orthopedics;  Laterality: Right;  . Breast biopsy Right 1992    neg  . Cataract extraction w/phaco Right 10/30/2014    Procedure: CATARACT EXTRACTION PHACO AND INTRAOCULAR LENS PLACEMENT (IOC);  Surgeon: Birder Robson, MD;  Location: [facility] OR[facility];  Service: Ophthalmology;  Laterality: Right;  US0:40 AP18.2 CDE7.42  cas[place] lot [id number]  . Cataract extraction w/phaco Left 11/27/2014    Procedure: CATARACT EXTRACTION PHACO AND INTRAOCULAR LENS PLACEMENT (IOC);  Surgeon: Birder Robson, MD;  Location: [facility];  Service: Ophthalmology;  Laterality: Left;  [place] 00:36.9 AP%: 18.9 CDE: 6.96  casette lot #  [id number]  . Joint replacement      RT HIP  2013  (PARTIAL)   . Vaginal hysterectomy Bilateral 05/13/2015    Procedure: HYSTERECTOMY VAGINAL WITH BILATERAL SALPINGO OOPHERECTOMY;  Surgeon: Brayton Mars, MD;  Location: [facility] OR[facility];  Service: Gynecology;  Laterality: Bilateral;  . Cystocele repair N/A 05/13/2015    Procedure: ANTERIOR REPAIR (CYSTOCELE);  Surgeon: Brayton Mars, MD;  Location: [facility] OR[facility];  Service: Gynecology;  Laterality: N/A;  .  Cystoscopy  05/13/2015    Procedure: CYSTOSCOPY;  Surgeon: Brayton Mars, MD;  Location: [facility] OR[facility];  Service: Gynecology;;  . Vaginal hysterectomy      bso ant repair   Family History  Problem Relation Age of Onset  . Glaucoma Mother   . Hypertension Mother   . Thyroid disease Mother   . Stroke Father   . Hypertension Father   . Cancer Neg Hx   . Diabetes Neg Hx    Social History   Social History  . Marital Status: Divorced    Spouse Name: N/A  . Number of Children: N/A  . Years of Education: N/A   Social History Main Topics  . Smoking status: Former Smoker    Quit date: 02/10/2008  . Smokeless tobacco: Never Used  . Alcohol Use: 0.0 oz/week    0 Standard drinks or equivalent per week     Comment: rare  . Drug Use: No  . Sexual Activity: Not  Currently    Birth Control/ Protection: Post-menopausal   Other Topics Concern  . None   Social History Narrative    Outpatient Encounter Prescriptions as of 07/09/2015  Medication Sig  . acetaminophen (TYLENOL) 500 MG tablet Take 1,000 mg by mouth every 6 (six) hours as needed for fever.   Marland Kitchen amLODipine (NORVASC) 5 MG tablet TAKE ONE TABLET EVERY DAY  . aspirin EC 81 MG tablet Take 81 mg by mouth daily.  Marland Kitchen atorvastatin (LIPITOR) 40 MG tablet Take 1 tablet (40 mg total) by mouth daily. (Patient taking differently: Take 40 mg by mouth daily at 6 PM. )  . Biotin (BIOTIN MAXIMUM STRENGTH) 10 MG TABS Take 1 tablet by mouth daily.  . Carboxymethylcellulose Sodium (REFRESH TEARS OP) Apply 1 drop to eye as needed (for dry eyes).  . chlorthalidone (HYGROTON) 25 MG tablet TAKE 1 TABLET (25 MG TOTAL) BY MOUTH DAILY.  . cholecalciferol (VITAMIN D) 1000 UNITS tablet Take 1,000 Units by mouth daily.   Marland Kitchen docusate sodium (COLACE) 100 MG capsule Take 100 mg by mouth 2 (two) times daily as needed for mild constipation.   . ferrous sulfate 325 (65 FE) MG tablet Take 325 mg by mouth daily.  Marland Kitchen gabapentin (NEURONTIN) 300 MG capsule Take 300 mg by mouth at bedtime.  . Glycerin-Polysorbate 80 (REFRESH DRY EYE THERAPY OP) Apply to eye.  . magnesium oxide (MAG-OX) 400 MG tablet Take 400 mg by mouth daily.  . Multiple Vitamins-Minerals (MULTIVITAMIN PO) Take 1 tablet by mouth daily.  . Omega-3 Fatty Acids (OMEGA 3 PO) Take 1 capsule by mouth 3 (three) times a week.   . pantoprazole (PROTONIX) 40 MG tablet TAKE ONE TABLET BY MOUTH TWICE DAILY  . potassium chloride (KLOR-CON M10) 10 MEQ tablet Take 1 tablet (10 mEq total) by mouth daily.  . ranitidine (ZANTAC) 150 MG tablet Take 150 mg by mouth 2 (two) times daily.  . valsartan (DIOVAN) 320 MG tablet TAKE ONE TABLET BY MOUTH EVERY DAY  . [DISCONTINUED] latanoprost (XALATAN) 0.005 % ophthalmic solution    No facility-administered encounter medications on file as of  07/09/2015.    Review of Systems  Constitutional: Negative for appetite change and unexpected weight change.  HENT: Negative for congestion and sinus pressure.   Respiratory: Negative for cough, chest tightness and shortness of breath.   Cardiovascular: Negative for chest pain, palpitations and leg swelling.  Gastrointestinal: Negative for nausea, vomiting, abdominal pain and diarrhea.  Genitourinary: Negative for dysuria and difficulty urinating.  Musculoskeletal:  Positive for back pain.       Low back pain with leg pain.    Skin: Negative for color change and rash.  Neurological: Negative for dizziness, light-headedness and headaches.  Psychiatric/Behavioral: Negative for dysphoric mood and agitation.       Objective:     Blood pressure rechecked by me:  120/72  Physical Exam  Constitutional: She appears well-developed and well-nourished. No distress.  HENT:  Nose: Nose normal.  Mouth/Throat: Oropharynx is clear and moist.  Neck: Neck supple. No thyromegaly present.  Cardiovascular: Normal rate and regular rhythm.   Pulmonary/Chest: Breath sounds normal. No respiratory distress. She has no wheezes.  Abdominal: Soft. Bowel sounds are normal. There is no tenderness.  Musculoskeletal: She exhibits no edema or tenderness.  Lymphadenopathy:    She has no cervical adenopathy.  Skin: No rash noted. No erythema.  Psychiatric: She has a normal mood and affect. Her behavior is normal.    BP 90/60 mmHg  Pulse 66  Temp(Src) 97.8 F (36.6 C) (Oral)  Resp 18  Ht 5\' 6"  (1.676 m)  Wt 159 lb 8 oz (72.349 kg)  BMI 25.76 kg/m2  SpO2 97% Wt Readings from Last 3 Encounters:  07/09/15 159 lb 8 oz (72.349 kg)  06/27/15 161 lb (73.029 kg)  05/21/15 162 lb 14.4 oz (73.891 kg)     Lab Results  Component Value Date   WBC 7.7 05/08/2015   HGB 10.6* 05/14/2015   HCT 40.0 05/13/2015   PLT 350 05/08/2015   GLUCOSE 107* 05/13/2015   CHOL 170 04/16/2015   TRIG 104.0 04/16/2015   HDL  65.40 04/16/2015   LDLCALC 83 04/16/2015   ALT 14 04/16/2015   AST 18 04/16/2015   NA 142 05/13/2015   K 3.4* 05/13/2015   CL 101 04/16/2015   CREATININE 0.90 04/16/2015   BUN 20 04/16/2015   CO2 28 04/16/2015   TSH 0.86 09/18/2014   INR 0.91 07/05/2014   HGBA1C 5.9 04/16/2015       Assessment & Plan:   Problem List Items Addressed This Visit    Anxiety    No longer seeing psychiatry.  Recommended to start effexor by neurology.  Follow.        Hypercholesterolemia    Low cholesterol diet and exercise.  Follow lipid panel.        Relevant Orders   Lipid panel   Hepatic function panel   Hypertension    Blood pressure on recheck improved.  Follow pressures.  Follow metabolic panel.        Relevant Orders   Basic metabolic panel   TSH   Low back pain    Saw neurology.  See note.  Referred to surgery.  Will f/u with them about referral.        Neuropathy ([place])    Off lyrica.  On gabapentin.  Seeing neurology.         Other Visit Diagnoses    Screening breast examination    -  Primary    Relevant Orders    MM DIGITAL SCREENING BILATERAL    Hyperglycemia        Relevant Orders    Hemoglobin A1c        Einar Pheasant, MD

## 2015-07-09 NOTE — Telephone Encounter (Signed)
Sarah from [facility] called. Pt is requesting to have filled effexor . Sarah from [facility] does not see the medication ever filled.

## 2015-07-09 NOTE — Progress Notes (Signed)
Pre-visit discussion using our clinic review tool. No additional management support is needed unless otherwise documented below in the visit note.  

## 2015-07-10 NOTE — Telephone Encounter (Signed)
Spoke with the pharmacy, sarah will contact Dr. Trena Platt office. thanks

## 2015-07-10 NOTE — Telephone Encounter (Signed)
Patient was seen yesterday , I don't see an order for effexor? Please advise?

## 2015-07-10 NOTE — Telephone Encounter (Signed)
I did not prescribe the medication.  Her neurologist prescribed the medication.  Please have the pharmacy contact Dr Trena Platt office.  See his last office note in Care Everywhere.

## 2015-07-14 ENCOUNTER — Encounter: Payer: Self-pay | Admitting: Internal Medicine

## 2015-07-14 DIAGNOSIS — M545 Low back pain, unspecified: Secondary | ICD-10-CM | POA: Insufficient documentation

## 2015-07-14 NOTE — Assessment & Plan Note (Signed)
Low cholesterol diet and exercise.  Follow lipid panel.   

## 2015-07-14 NOTE — Assessment & Plan Note (Signed)
Off lyrica.  On gabapentin.  Seeing neurology.

## 2015-07-14 NOTE — Assessment & Plan Note (Signed)
No longer seeing psychiatry.  Recommended to start effexor by neurology.  Follow.

## 2015-07-14 NOTE — Assessment & Plan Note (Signed)
Saw neurology.  See note.  Referred to surgery.  Will f/u with them about referral.

## 2015-07-14 NOTE — Assessment & Plan Note (Signed)
Blood pressure on recheck improved.  Follow pressures.  Follow metabolic panel.  

## 2015-07-16 ENCOUNTER — Telehealth: Payer: Self-pay | Admitting: Internal Medicine

## 2015-07-16 NOTE — Telephone Encounter (Signed)
Please advise refill, thanks 

## 2015-07-16 NOTE — Telephone Encounter (Signed)
Caller name: Lylianah Sikkenga Relationship to patient: patient Can be reached:[phone]  Reason for call: left message stating that the medication Dimple Nanas) that she was given by Dr. Manuella Ghazi was causing side effect and she had to stop taking it. Pt stated that she had rx for Gabapentin but will need a refill on it and request for Dr. Nicki Reaper to refill it.

## 2015-07-17 NOTE — Telephone Encounter (Signed)
When did she restart the medication and how often taking?  I think Dr Manuella Ghazi originally prescribed this medication.  Also, I need to know exactly what medications she is taking.  I am confused on what she is actually taking.

## 2015-07-18 DIAGNOSIS — M898X9 Other specified disorders of bone, unspecified site: Secondary | ICD-10-CM | POA: Diagnosis not present

## 2015-07-18 DIAGNOSIS — B351 Tinea unguium: Secondary | ICD-10-CM | POA: Diagnosis not present

## 2015-07-18 DIAGNOSIS — M2042 Other hammer toe(s) (acquired), left foot: Secondary | ICD-10-CM | POA: Diagnosis not present

## 2015-07-18 DIAGNOSIS — M2041 Other hammer toe(s) (acquired), right foot: Secondary | ICD-10-CM | POA: Diagnosis not present

## 2015-07-19 ENCOUNTER — Telehealth: Payer: Self-pay | Admitting: Internal Medicine

## 2015-07-19 MED ORDER — GABAPENTIN 300 MG PO CAPS: 300.0000 mg | ORAL_CAPSULE | Freq: Every day | ORAL | Status: DC

## 2015-07-19 NOTE — Telephone Encounter (Signed)
Pt medications are making her sick to her stomach. She thinks she needs a medication change. Pt would like to have a refill of her gabapentin (NEURONTIN) 300 MG capsule. Pt also said that Dr. Nicki Reaper was going to speak to Dr. Brigitte Pulse to see what he wanted to do with pt's back pain. Please advise patient.  Thanks

## 2015-07-19 NOTE — Telephone Encounter (Signed)
Refilled the Gabapentin, left a VM for the patient to return my call in regards to the dermatology referral, thanks

## 2015-07-19 NOTE — Telephone Encounter (Signed)
Spoke with patient.  She was taking effexor, stopped taking due to side effects, was then on Lyrica, stopped that also due to side effects, restarted the gabapentin and needs a refill on that.  Gabapentin 300mg  take 1 capsule at bedtime daily.    She also wanted to know about two referral that she though were in the works, one to dermatology and one to a provider regarding  Her back issues/pain.    Please advise.

## 2015-07-19 NOTE — Telephone Encounter (Signed)
If she has taken gabapentin 300mg  q hs and had no problems, ok to refill x 2.  Regarding the referral for back, neurology was doing the referral.  I will touch base with them about where stand with referral.  Confirm why dermatology referral needed and I will check on this.  Thanks

## 2015-07-21 NOTE — Telephone Encounter (Signed)
See refill note.  ok'd refill for gabapentin.

## 2015-07-22 NOTE — Telephone Encounter (Signed)
Pt called back returning your call regarding the dermatologist referral. Pt states she would rather go to the dermatologist at [facility] or any place in [place].   Call pt @ [phone]. Thank you!

## 2015-07-22 NOTE — Telephone Encounter (Signed)
Spoke with the patient, she hasn't seen a dermatologist in years and has a few spots that are bothersome that she would like someone to look at. She is fine with any dermatologist in [place], just doesn't want to wait forever to get seen.  thanks

## 2015-07-23 ENCOUNTER — Other Ambulatory Visit: Payer: Self-pay | Admitting: Internal Medicine

## 2015-07-23 DIAGNOSIS — L989 Disorder of the skin and subcutaneous tissue, unspecified: Secondary | ICD-10-CM

## 2015-07-23 NOTE — Telephone Encounter (Signed)
Order placed for dermatology referral.  

## 2015-07-23 NOTE — Progress Notes (Signed)
Order placed for dermatology referral.  

## 2015-07-24 ENCOUNTER — Other Ambulatory Visit: Payer: Self-pay

## 2015-07-24 MED ORDER — ATORVASTATIN CALCIUM 40 MG PO TABS
40.0000 mg | ORAL_TABLET | Freq: Every day | ORAL | Status: DC
Start: 2015-07-24 — End: 2016-01-30

## 2015-07-25 ENCOUNTER — Encounter: Payer: Self-pay | Admitting: Internal Medicine

## 2015-08-08 ENCOUNTER — Other Ambulatory Visit (INDEPENDENT_AMBULATORY_CARE_PROVIDER_SITE_OTHER): Payer: Medicare Other

## 2015-08-08 DIAGNOSIS — E7800 Pure hypercholesterolemia, unspecified: Secondary | ICD-10-CM | POA: Diagnosis not present

## 2015-08-08 DIAGNOSIS — R739 Hyperglycemia, unspecified: Secondary | ICD-10-CM | POA: Diagnosis not present

## 2015-08-08 DIAGNOSIS — I10 Essential (primary) hypertension: Secondary | ICD-10-CM

## 2015-08-08 LAB — HEPATIC FUNCTION PANEL
ALBUMIN: 4.4 g/dL (ref 3.5–5.2)
ALT: 17 U/L (ref 0–35)
AST: 20 U/L (ref 0–37)
Alkaline Phosphatase: 62 U/L (ref 39–117)
BILIRUBIN TOTAL: 0.7 mg/dL (ref 0.2–1.2)
Bilirubin, Direct: 0.1 mg/dL (ref 0.0–0.3)
Total Protein: 7.3 g/dL (ref 6.0–8.3)

## 2015-08-08 LAB — BASIC METABOLIC PANEL
BUN: 21 mg/dL (ref 6–23)
CHLORIDE: 103 meq/L (ref 96–112)
CO2: 30 meq/L (ref 19–32)
Calcium: 10.4 mg/dL (ref 8.4–10.5)
Creatinine, Ser: 0.7 mg/dL (ref 0.40–1.20)
GFR: 88.34 mL/min (ref >60.00)
Glucose, Bld: 97 mg/dL (ref 70–99)
POTASSIUM: 4 meq/L (ref 3.5–5.1)
SODIUM: 142 meq/L (ref 135–145)

## 2015-08-08 LAB — TSH: TSH: 1.11 u[IU]/mL (ref 0.35–4.50)

## 2015-08-08 LAB — LIPID PANEL
CHOL/HDL RATIO: 3
Cholesterol: 184 mg/dL (ref 0–200)
HDL: 55.4 mg/dL (ref >39.00)
LDL CALC: 100 mg/dL — AB (ref 0–99)
NONHDL: 128.56
Triglycerides: 145 mg/dL (ref 0.0–149.0)
VLDL: 29 mg/dL (ref 0.0–40.0)

## 2015-08-08 LAB — HEMOGLOBIN A1C: HEMOGLOBIN A1C: 5.5 % (ref 4.6–6.5)

## 2015-08-09 ENCOUNTER — Encounter: Payer: Self-pay | Admitting: *Deleted

## 2015-08-16 ENCOUNTER — Telehealth: Payer: Self-pay | Admitting: Internal Medicine

## 2015-08-16 ENCOUNTER — Other Ambulatory Visit: Payer: Self-pay | Admitting: Internal Medicine

## 2015-08-16 NOTE — Telephone Encounter (Signed)
Pt called about needing more clarification regarding test results. Pt also mentioned needing a referral for her back.  Call pt @ [phone]. Thank you!

## 2015-08-16 NOTE — Telephone Encounter (Signed)
Last refilled 04/29/15 and last labs drawn 07/30/15 with a reading of 4.0. Please advise?

## 2015-08-16 NOTE — Telephone Encounter (Signed)
Her last sugar check looked good.  Continue low carb diet.  We will follow.  In reviewing neurology's note, they are the ones that wanted her referred to an orthopedist.  I would recommend calling Dr Trena Platt office and seeing if they have heard anything regarding the referral.

## 2015-08-16 NOTE — Telephone Encounter (Signed)
Patient wanted clarification that she was not diabetic. She was told that she is not diabetic. Patient wanted to know if she could get a referral for her back. Stated she had not heard anything. Please advise?

## 2015-08-16 NOTE — Telephone Encounter (Signed)
Pt advised and told to check with Dr.Shahs office

## 2015-08-17 NOTE — Telephone Encounter (Signed)
rx sent in for kcl #90 with one refill.

## 2015-09-10 DIAGNOSIS — Z961 Presence of intraocular lens: Secondary | ICD-10-CM | POA: Diagnosis not present

## 2015-09-23 ENCOUNTER — Other Ambulatory Visit: Payer: Self-pay | Admitting: Internal Medicine

## 2015-09-23 ENCOUNTER — Ambulatory Visit
Admission: RE | Admit: 2015-09-23 | Discharge: 2015-09-23 | Disposition: A | Discharge status: Home / Self Care | Payer: Medicare Other | Source: Ambulatory Visit | Attending: Internal Medicine | Admitting: Internal Medicine

## 2015-09-23 DIAGNOSIS — Z1231 Encounter for screening mammogram for malignant neoplasm of breast: Secondary | ICD-10-CM | POA: Insufficient documentation

## 2015-09-23 DIAGNOSIS — Z1239 Encounter for other screening for malignant neoplasm of breast: Secondary | ICD-10-CM

## 2015-10-02 DIAGNOSIS — L821 Other seborrheic keratosis: Secondary | ICD-10-CM | POA: Diagnosis not present

## 2015-10-02 DIAGNOSIS — D1801 Hemangioma of skin and subcutaneous tissue: Secondary | ICD-10-CM | POA: Diagnosis not present

## 2015-10-02 DIAGNOSIS — S70361A Insect bite (nonvenomous), right thigh, initial encounter: Secondary | ICD-10-CM | POA: Diagnosis not present

## 2015-10-10 ENCOUNTER — Other Ambulatory Visit: Payer: Self-pay | Admitting: Internal Medicine

## 2015-10-10 NOTE — Telephone Encounter (Signed)
Has appointment tomorrow. Renaldo Fiddler, CMA

## 2015-10-11 ENCOUNTER — Ambulatory Visit (INDEPENDENT_AMBULATORY_CARE_PROVIDER_SITE_OTHER): Payer: Medicare Other | Admitting: Internal Medicine

## 2015-10-11 ENCOUNTER — Encounter: Payer: Self-pay | Admitting: Internal Medicine

## 2015-10-11 VITALS — BP 110/78 | HR 63 | Temp 97.7°F | Resp 18 | Ht 65.5 in | Wt 155.2 lb

## 2015-10-11 DIAGNOSIS — Z860100 Personal history of colon polyps, unspecified: Secondary | ICD-10-CM

## 2015-10-11 DIAGNOSIS — M5441 Lumbago with sciatica, right side: Secondary | ICD-10-CM | POA: Diagnosis not present

## 2015-10-11 DIAGNOSIS — E7800 Pure hypercholesterolemia, unspecified: Secondary | ICD-10-CM

## 2015-10-11 DIAGNOSIS — M25561 Pain in right knee: Secondary | ICD-10-CM | POA: Diagnosis not present

## 2015-10-11 DIAGNOSIS — G629 Polyneuropathy, unspecified: Secondary | ICD-10-CM

## 2015-10-11 DIAGNOSIS — R42 Dizziness and giddiness: Secondary | ICD-10-CM

## 2015-10-11 DIAGNOSIS — M5442 Lumbago with sciatica, left side: Secondary | ICD-10-CM

## 2015-10-11 DIAGNOSIS — Z90710 Acquired absence of both cervix and uterus: Secondary | ICD-10-CM | POA: Diagnosis not present

## 2015-10-11 DIAGNOSIS — E210 Primary hyperparathyroidism: Secondary | ICD-10-CM

## 2015-10-11 DIAGNOSIS — F32A Depression, unspecified: Secondary | ICD-10-CM

## 2015-10-11 DIAGNOSIS — Z0000 Encounter for general adult medical examination without abnormal findings: Secondary | ICD-10-CM

## 2015-10-11 DIAGNOSIS — Z23 Encounter for immunization: Secondary | ICD-10-CM

## 2015-10-11 DIAGNOSIS — I10 Essential (primary) hypertension: Secondary | ICD-10-CM

## 2015-10-11 DIAGNOSIS — F329 Major depressive disorder, single episode, unspecified: Secondary | ICD-10-CM

## 2015-10-11 NOTE — Progress Notes (Signed)
Patient ID: [name], female   DOB: 01/19/1946, 70 y.o.   MRN: [id number]   Subjective:    Patient ID: [name], female    DOB: 07/31/1945, 70 y.o.   MRN: [id number]  HPI  Patient here for her physical exam.  She reports that she is doing well regarding her gyn surgery.  She is s/p hysterectomy.  Feels better.  Tries to stay active.  No chest pain.  No sob.  No acid reflux.  No abdominal pain or cramping.  Bowels stable.  She is s/p right knee surgery.  With increased knee pain and some swelling.  She also reports some low back pain.  Has had MRI previously - 04/2014.  See report.  Has seen surgery previously.  Request referral back.     Past Medical History:  Diagnosis Date  . Anemia   . Anxiety   . Arthritis   . Burning feet syndrome   . Chronic insomnia   . Complication of anesthesia    "AFFECTS MY MEMORY"  . COPD (chronic obstructive pulmonary disease) ([place])    documented in outside records  . Depression   . Dysrhythmia    PALPITATIONS   . Edema    MILD ANKLE  . GERD (gastroesophageal reflux disease)   . Heart murmur   . History of stomach ulcers   . HOH (hard of hearing)    AIDS  . Hypercholesterolemia   . Hyperparathyroidism, primary ([place])   . Hypertension   . Neuropathy ([place])   . Osteoporosis   . Palpitations   . Scoliosis   . Shortness of breath    with exertion  . Tingling    both hands   Past Surgical History:  Procedure Laterality Date  . ANTERIOR CERVICAL DECOMP/DISCECTOMY FUSION  10/28/2011   Procedure: ANTERIOR CERVICAL DECOMPRESSION/DISCECTOMY FUSION 2 LEVELS;  Surgeon: Elaina Hoops, MD;  Location: [facility];  Service: Neurosurgery;  Laterality: N/A;  . BREAST BIOPSY Right 1992   neg  . BREAST SURGERY     RT BREAST CYST REMOVED 15 YRS AGO   . CATARACT EXTRACTION W/PHACO Right 10/30/2014   Procedure: CATARACT EXTRACTION PHACO AND INTRAOCULAR LENS PLACEMENT (IOC);  Surgeon: Birder Robson, MD;  Location: [facility];  Service:  Ophthalmology;  Laterality: Right;  US0:40 AP18.2 CDE7.42  [place] lot [id number]  . CATARACT EXTRACTION W/PHACO Left 11/27/2014   Procedure: CATARACT EXTRACTION PHACO AND INTRAOCULAR LENS PLACEMENT (IOC);  Surgeon: Birder Robson, MD;  Location: [facility];  Service: Ophthalmology;  Laterality: Left;  [place] 00:36.9 AP%: 18.9 CDE: 6.96  casette lot #  [id number]  . CYSTOCELE REPAIR N/A 05/13/2015   Procedure: ANTERIOR REPAIR (CYSTOCELE);  Surgeon: Brayton Mars, MD;  Location: [facility];  Service: Gynecology;  Laterality: N/A;  . CYSTOSCOPY  05/13/2015   Procedure: CYSTOSCOPY;  Surgeon: Brayton Mars, MD;  Location: [facility];  Service: Gynecology;;  . DILATION AND CURETTAGE OF UTERUS     X2   . JOINT REPLACEMENT     RT HIP  2013  (PARTIAL)   . PARATHYROIDECTOMY Left 08/25/2012   Procedure: PARATHYROIDECTOMY;  Surgeon: Earnstine Regal, MD;  Location: [facility];  Service: General;  Laterality: Left;  . PITUITARY SURGERY     FOR TUMOR 3 YRS   . TOTAL KNEE ARTHROPLASTY Right 07/18/2014   Procedure: TOTAL KNEE ARTHROPLASTY;  Surgeon: Dereck Leep, MD;  Location: [facility];  Service: Orthopedics;  Laterality: Right;  Marland Kitchen VAGINAL HYSTERECTOMY Bilateral 05/13/2015  Procedure: HYSTERECTOMY VAGINAL WITH BILATERAL SALPINGO OOPHERECTOMY;  Surgeon: Brayton Mars, MD;  Location: [facility];  Service: Gynecology;  Laterality: Bilateral;  . VAGINAL HYSTERECTOMY     bso ant repair   Family History  Problem Relation Age of Onset  . Glaucoma Mother   . Hypertension Mother   . Thyroid disease Mother   . Stroke Father   . Hypertension Father   . Cancer Neg Hx   . Diabetes Neg Hx    Social History   Social History  . Marital status: Divorced    Spouse name: N/A  . Number of children: N/A  . Years of education: N/A   Social History Main Topics  . Smoking status: Former Smoker    Quit date: 02/10/2008  . Smokeless tobacco: Never Used  . Alcohol use 0.0 oz/week     Comment: rare  . Drug use:  No  . Sexual activity: Not Currently    Birth control/ protection: Post-menopausal   Other Topics Concern  . None   Social History Narrative  . None    Outpatient Encounter Prescriptions as of 10/11/2015  Medication Sig  . acetaminophen (TYLENOL) 500 MG tablet Take 1,000 mg by mouth every 6 (six) hours as needed for fever.   Marland Kitchen amLODipine (NORVASC) 5 MG tablet TAKE ONE TABLET EVERY DAY  . aspirin EC 81 MG tablet Take 81 mg by mouth daily.  Marland Kitchen atorvastatin (LIPITOR) 40 MG tablet Take 1 tablet (40 mg total) by mouth daily.  . Biotin (BIOTIN MAXIMUM STRENGTH) 10 MG TABS Take 1 tablet by mouth daily.  . Carboxymethylcellulose Sodium (REFRESH TEARS OP) Apply 1 drop to eye as needed (for dry eyes).  . chlorthalidone (HYGROTON) 25 MG tablet TAKE ONE TABLET BY MOUTH EVERY DAY  . cholecalciferol (VITAMIN D) 1000 UNITS tablet Take 1,000 Units by mouth daily.   Marland Kitchen docusate sodium (COLACE) 100 MG capsule Take 100 mg by mouth 2 (two) times daily as needed for mild constipation.   . ferrous sulfate 325 (65 FE) MG tablet Take 325 mg by mouth daily.  . Glycerin-Polysorbate 80 (REFRESH DRY EYE THERAPY OP) Apply to eye.  . magnesium oxide (MAG-OX) 400 MG tablet Take 400 mg by mouth daily.  . Multiple Vitamins-Minerals (MULTIVITAMIN PO) Take 1 tablet by mouth daily.  . Omega-3 Fatty Acids (OMEGA 3 PO) Take 1 capsule by mouth 3 (three) times a week.   . pantoprazole (PROTONIX) 40 MG tablet TAKE ONE TABLET BY MOUTH TWICE DAILY  . potassium chloride (K-DUR) 10 MEQ tablet TAKE ONE TABLET EVERY DAY  . ranitidine (ZANTAC) 150 MG tablet Take 150 mg by mouth 2 (two) times daily.  . valsartan (DIOVAN) 320 MG tablet TAKE ONE TABLET BY MOUTH EVERY DAY  . [DISCONTINUED] gabapentin (NEURONTIN) 300 MG capsule Take 1 capsule (300 mg total) by mouth at bedtime.   No facility-administered encounter medications on file as of 10/11/2015.     Review of Systems  Constitutional: Negative for appetite change and unexpected  weight change.  HENT: Negative for congestion and sinus pressure.   Eyes: Negative for pain and visual disturbance.  Respiratory: Negative for cough, chest tightness and shortness of breath.   Cardiovascular: Negative for chest pain, palpitations and leg swelling.  Gastrointestinal: Negative for abdominal pain, diarrhea, nausea and vomiting.  Genitourinary: Negative for difficulty urinating and dysuria.  Musculoskeletal: Positive for back pain.       Right knee pain and swelling.    Skin: Negative for color change  and rash.  Neurological: Negative for dizziness and headaches.  Hematological: Negative for adenopathy. Does not bruise/bleed easily.  Psychiatric/Behavioral: Negative for agitation and dysphoric mood.       Objective:    Physical Exam  Constitutional: She is oriented to person, place, and time. She appears well-developed and well-nourished. No distress.  HENT:  Nose: Nose normal.  Mouth/Throat: Oropharynx is clear and moist.  Eyes: Right eye exhibits no discharge. Left eye exhibits no discharge. No scleral icterus.  Neck: Neck supple. No thyromegaly present.  Cardiovascular: Normal rate and regular rhythm.   Pulmonary/Chest: Breath sounds normal. No accessory muscle usage. No tachypnea. No respiratory distress. She has no decreased breath sounds. She has no wheezes. She has no rhonchi. Right breast exhibits no inverted nipple, no mass, no nipple discharge and no tenderness (no axillary adenopathy). Left breast exhibits no inverted nipple, no mass, no nipple discharge and no tenderness (no axilarry adenopathy).  Abdominal: Soft. Bowel sounds are normal. There is no tenderness.  Musculoskeletal: She exhibits no edema or tenderness.  Lymphadenopathy:    She has no cervical adenopathy.  Neurological: She is alert and oriented to person, place, and time.  Skin: Skin is warm. No rash noted. No erythema.  Psychiatric: She has a normal mood and affect. Her behavior is normal.     BP 110/78   Pulse 63   Temp 97.7 F (36.5 C) (Oral)   Resp 18   Ht 5' 5.5" (1.664 m)   Wt 155 lb 4 oz (70.4 kg)   SpO2 97%   BMI 25.44 kg/m  Wt Readings from Last 3 Encounters:  10/11/15 155 lb 4 oz (70.4 kg)  07/09/15 159 lb 8 oz (72.3 kg)  06/27/15 161 lb (73 kg)     Lab Results  Component Value Date   WBC 7.7 05/08/2015   HGB 10.6 (L) 05/14/2015   HCT 40.0 05/13/2015   PLT 350 05/08/2015   GLUCOSE 97 08/08/2015   CHOL 184 08/08/2015   TRIG 145.0 08/08/2015   HDL 55.40 08/08/2015   LDLCALC 100 (H) 08/08/2015   ALT 17 08/08/2015   AST 20 08/08/2015   NA 142 08/08/2015   K 4.0 08/08/2015   CL 103 08/08/2015   CREATININE 0.70 08/08/2015   BUN 21 08/08/2015   CO2 30 08/08/2015   TSH 1.11 08/08/2015   INR 0.91 07/05/2014   HGBA1C 5.5 08/08/2015    Mm Screening Breast Tomo Bilateral  Result Date: 09/24/2015 CLINICAL DATA:  Screening. EXAM: 2D DIGITAL SCREENING BILATERAL MAMMOGRAM WITH CAD AND ADJUNCT TOMO COMPARISON:  Previous exam(s). ACR Breast Density Category c: The breast tissue is heterogeneously dense, which may obscure small masses. FINDINGS: There are no findings suspicious for malignancy. Images were processed with CAD. IMPRESSION: No mammographic evidence of malignancy. A result letter of this screening mammogram will be mailed directly to the patient. RECOMMENDATION: Screening mammogram in one year. (Code:SM-B-01Y) BI-RADS CATEGORY  1: Negative. Electronically Signed   By: Pamelia Hoit M.D.   On: 09/24/2015 18:11       Assessment & Plan:   Problem List Items Addressed This Visit    Depression    No longer seeing psychiatry.  Stable.        Health care maintenance    Physical today 10/11/15.  Mammogram 09/24/15 - Birads I.  Colonoscopy 09/15/13.        Relevant Orders   Vitamin B12 (Completed)   History of colonic polyps    Colonoscopy 09/15/13 as outlined  in overview.  Recommended f/u in 09/2023.      Hypercholesterolemia    Low cholesterol diet  and exercise.  Follow lipid panel.        Relevant Orders   Lipid panel   Hepatic function panel   Hyperparathyroidism, primary [facility])    S/p parathyroid surgery.  Followed by Dr Harlow Asa.  Follow calcium.        Hypertension    Blood pressure improved.  Follow.  Same medication regimen.  Follow pressures.  Follow metabolic panel.       Relevant Orders   Basic metabolic panel   Light headedness    Doing better off gabapentin.  Follow.       Low back pain    Persistent and increased low back pain and pain down her legs.  MRI reviewed.  Refer to neurosurgery for evaluation and further treatment.        Relevant Orders   Ambulatory referral to Neurosurgery   Neuropathy ([place]) - Primary    Increased pain with neuropathy.  Gabapentin and lyrica - did not tolerate.  Will check B12.        Relevant Orders   Vitamin B12 (Completed)   Right knee pain    S/p surgery.  With increased pain and swelling.  Schedule f/u with Dr Marry Guan.       Relevant Orders   Ambulatory referral to Orthopedic Surgery   Status post vaginal hysterectomy    Doing well.  Feels better.        Other Visit Diagnoses    Encounter for immunization       Relevant Orders   Vitamin B12 (Completed)   Routine general medical examination at a health care facility           Einar Pheasant, MD

## 2015-10-11 NOTE — Progress Notes (Signed)
Pre-visit discussion using our clinic review tool. No additional management support is needed unless otherwise documented below in the visit note.  

## 2015-10-11 NOTE — Assessment & Plan Note (Signed)
Physical today 10/11/15.  Mammogram 09/24/15 - Birads I.  Colonoscopy 09/15/13.

## 2015-10-12 ENCOUNTER — Encounter: Payer: Self-pay | Admitting: Internal Medicine

## 2015-10-12 LAB — VITAMIN B12: VITAMIN B 12: 986 pg/mL (ref 200–1100)

## 2015-10-12 NOTE — Assessment & Plan Note (Signed)
Low cholesterol diet and exercise.  Follow lipid panel.   

## 2015-10-12 NOTE — Assessment & Plan Note (Signed)
Doing better off gabapentin.  Follow.

## 2015-10-12 NOTE — Assessment & Plan Note (Signed)
Increased pain with neuropathy.  Gabapentin and lyrica - did not tolerate.  Will check B12.

## 2015-10-12 NOTE — Assessment & Plan Note (Signed)
S/p surgery.  With increased pain and swelling.  Schedule f/u with Dr Marry Guan.

## 2015-10-12 NOTE — Assessment & Plan Note (Signed)
Colonoscopy 09/15/13 as outlined in overview.  Recommended f/u in 09/2023.

## 2015-10-12 NOTE — Assessment & Plan Note (Signed)
Blood pressure improved.  Follow.  Same medication regimen.  Follow pressures.  Follow metabolic panel.

## 2015-10-12 NOTE — Assessment & Plan Note (Signed)
No longer seeing psychiatry.  Stable.

## 2015-10-12 NOTE — Assessment & Plan Note (Signed)
Persistent and increased low back pain and pain down her legs.  MRI reviewed.  Refer to neurosurgery for evaluation and further treatment.

## 2015-10-12 NOTE — Assessment & Plan Note (Signed)
Doing well.  Feels better.

## 2015-10-12 NOTE — Assessment & Plan Note (Signed)
S/p parathyroid surgery.  Followed by Dr Harlow Asa.  Follow calcium.

## 2015-10-15 ENCOUNTER — Other Ambulatory Visit: Payer: Self-pay | Admitting: Internal Medicine

## 2015-12-05 ENCOUNTER — Other Ambulatory Visit: Payer: Self-pay | Admitting: Internal Medicine

## 2015-12-19 DIAGNOSIS — G8929 Other chronic pain: Secondary | ICD-10-CM | POA: Diagnosis not present

## 2015-12-19 DIAGNOSIS — M25561 Pain in right knee: Secondary | ICD-10-CM | POA: Diagnosis not present

## 2015-12-19 DIAGNOSIS — M5416 Radiculopathy, lumbar region: Secondary | ICD-10-CM | POA: Diagnosis not present

## 2015-12-19 DIAGNOSIS — Z96651 Presence of right artificial knee joint: Secondary | ICD-10-CM | POA: Diagnosis not present

## 2015-12-24 ENCOUNTER — Other Ambulatory Visit (INDEPENDENT_AMBULATORY_CARE_PROVIDER_SITE_OTHER): Payer: Medicare Other

## 2015-12-24 DIAGNOSIS — I10 Essential (primary) hypertension: Secondary | ICD-10-CM | POA: Diagnosis not present

## 2015-12-24 DIAGNOSIS — E7800 Pure hypercholesterolemia, unspecified: Secondary | ICD-10-CM | POA: Diagnosis not present

## 2015-12-24 LAB — LIPID PANEL
CHOLESTEROL: 190 mg/dL (ref 0–200)
HDL: 63.9 mg/dL (ref >39.00)
LDL Cholesterol: 100 mg/dL — ABNORMAL HIGH (ref 0–99)
NONHDL: 125.87
Total CHOL/HDL Ratio: 3
Triglycerides: 130 mg/dL (ref 0.0–149.0)
VLDL: 26 mg/dL (ref 0.0–40.0)

## 2015-12-24 LAB — BASIC METABOLIC PANEL
BUN: 18 mg/dL (ref 6–23)
CALCIUM: 9.7 mg/dL (ref 8.4–10.5)
CO2: 32 meq/L (ref 19–32)
CREATININE: 0.76 mg/dL (ref 0.40–1.20)
Chloride: 102 mEq/L (ref 96–112)
GFR: 80.26 mL/min (ref >60.00)
GLUCOSE: 95 mg/dL (ref 70–99)
Potassium: 3.4 mEq/L — ABNORMAL LOW (ref 3.5–5.1)
Sodium: 142 mEq/L (ref 135–145)

## 2015-12-24 LAB — HEPATIC FUNCTION PANEL
ALK PHOS: 63 U/L (ref 39–117)
ALT: 18 U/L (ref 0–35)
AST: 18 U/L (ref 0–37)
Albumin: 4.5 g/dL (ref 3.5–5.2)
BILIRUBIN DIRECT: 0.1 mg/dL (ref 0.0–0.3)
Total Bilirubin: 0.7 mg/dL (ref 0.2–1.2)
Total Protein: 7.1 g/dL (ref 6.0–8.3)

## 2015-12-31 ENCOUNTER — Ambulatory Visit: Payer: Medicare Other | Admitting: Internal Medicine

## 2015-12-31 DIAGNOSIS — E236 Other disorders of pituitary gland: Secondary | ICD-10-CM | POA: Diagnosis not present

## 2015-12-31 DIAGNOSIS — M545 Low back pain: Secondary | ICD-10-CM | POA: Diagnosis not present

## 2016-01-07 ENCOUNTER — Encounter: Payer: Self-pay | Admitting: Obstetrics and Gynecology

## 2016-01-07 ENCOUNTER — Ambulatory Visit (INDEPENDENT_AMBULATORY_CARE_PROVIDER_SITE_OTHER): Payer: Medicare Other | Admitting: Obstetrics and Gynecology

## 2016-01-07 VITALS — BP 109/71 | HR 64 | Ht 65.5 in | Wt 148.0 lb

## 2016-01-07 DIAGNOSIS — N8111 Cystocele, midline: Secondary | ICD-10-CM | POA: Diagnosis not present

## 2016-01-07 DIAGNOSIS — Z90710 Acquired absence of both cervix and uterus: Secondary | ICD-10-CM

## 2016-01-07 DIAGNOSIS — N952 Postmenopausal atrophic vaginitis: Secondary | ICD-10-CM

## 2016-01-07 NOTE — Patient Instructions (Signed)
1. Continue to have routine physical with Dr. Einar Pheasant 2. Return here as needed for any gynecologic issues

## 2016-01-07 NOTE — Progress Notes (Signed)
Chief complaint: 1. Vaginal atrophy 2. Status post hysterectomy  [name] presents today for 6 month follow-up after transvaginal hysterectomy with anterior colporrhaphy for pelvic organ prolapse. She is doing well with normal bowel and bladder function. She is not experiencing any urinary frequency or urgency. She is not experiencing any urinary incontinence. Patient is not sexually active.  Past medical history, past surgical history, problem list, medications, and allergies are reviewed  OBJECTIVE: BP 109/71   Pulse 64   Ht 5' 5.5" (1.664 m)   Wt 148 lb (67.1 kg)   BMI 24.25 kg/m  Pleasant female in no acute distress. Abdomen: Soft, nontender without organomegaly Pelvic exam: External genitalia-normal BUS-normal Vagina-moderate to severe atrophy; first-degree cystocele and No rectocele Cervix-surgically absent Uterus-surgically absent Bimanual-masses or tenderness rectovaginal-normal external exam  ASSESSMENT: 1. first-degree cystocele, asymptomatic 2. vaginal atrophy, asymptomatic 3. status post transvaginal hysterectomy with anterior colporrhaphy for pelvic organ prolapse  PLAN: 1. return for any gynecologic issues as needed 2. continue with routine health maintenance exams with dr. Randell Patient scott  A total of 15 minutes were spent face-to-face with the patient during this encounter and over half of that time dealt with counseling and coordination of care.  Brayton Mars, MD  Note: This dictation was prepared with Dragon dictation along with smaller phrase technology. Any transcriptional errors that result from this process are unintentional.

## 2016-01-14 ENCOUNTER — Ambulatory Visit (INDEPENDENT_AMBULATORY_CARE_PROVIDER_SITE_OTHER): Payer: Medicare Other | Admitting: Family

## 2016-01-14 ENCOUNTER — Ambulatory Visit (INDEPENDENT_AMBULATORY_CARE_PROVIDER_SITE_OTHER): Payer: Medicare Other

## 2016-01-14 ENCOUNTER — Encounter: Payer: Self-pay | Admitting: Family

## 2016-01-14 VITALS — BP 140/88 | HR 67 | Temp 97.5°F | Ht 65.0 in | Wt 151.6 lb

## 2016-01-14 DIAGNOSIS — R05 Cough: Secondary | ICD-10-CM | POA: Diagnosis not present

## 2016-01-14 DIAGNOSIS — J209 Acute bronchitis, unspecified: Secondary | ICD-10-CM | POA: Diagnosis not present

## 2016-01-14 MED ORDER — BENZONATATE 100 MG PO CAPS: 100.0000 mg | ORAL_CAPSULE | Freq: Two times a day (BID) | ORAL | 0 refills | Status: DC | PRN

## 2016-01-14 NOTE — Progress Notes (Signed)
Pre visit review using our clinic review tool, if applicable. No additional management support is needed unless otherwise documented below in the visit note. 

## 2016-01-14 NOTE — Patient Instructions (Addendum)
Chest Xray  Increase intake of clear fluids. Congestion is best treated by hydration, when mucus is wetter, it is thinner, less sticky, and easier to expel from the body, either through coughing up drainage, or by blowing your nose.   Get plenty of rest.   Use saline nasal drops and blow your nose frequently. Run a humidifier at night and elevate the head of the bed. Vicks Vapor rub will help with congestion and cough. Steam showers and sinus massage for congestion.   Use Acetaminophen or Ibuprofen as needed for fever or pain. Avoid second hand smoke. Even the smallest exposure will worsen symptoms.   May try Mucinex to break on congestion if gets thick.  Note, be sure to drink two glasses of water with each dose of Mucinex as the medication will not work well without adequate hydration.   You can also try a teaspoon of honey to see if this will help reduce cough. Throat lozenges can sometimes be beneficial as well.    This illness will typically last 7 - 10 days.   Please follow up with our clinic if you develop a fever greater than 101 F, symptoms worsen, or do not resolve in the next week.

## 2016-01-14 NOTE — Progress Notes (Signed)
Subjective:    Patient ID: [name], female    DOB: 1945/09/11, 70 y.o.   MRN: [id number]  CC: [name] is a 70 y.o. female who presents today for an acute visit.    HPI: CC: productive cough, worse at night for 5 days, unchanged. Endorses post nasal drip, itchy water eyes and throat. No SOB, wheezing, fever. Dental procedure coincides with symptoms. Tried claritin with relief.   Former smoker. History of COPD and had been on inhalers in the past.     HISTORY:  Past Medical History:  Diagnosis Date  . Anemia   . Anxiety   . Arthritis   . Burning feet syndrome   . Chronic insomnia   . Complication of anesthesia    "AFFECTS MY MEMORY"  . COPD (chronic obstructive pulmonary disease) ([place])    documented in outside records  . Depression   . Dysrhythmia    PALPITATIONS   . Edema    MILD ANKLE  . GERD (gastroesophageal reflux disease)   . Heart murmur   . History of stomach ulcers   . HOH (hard of hearing)    AIDS  . Hypercholesterolemia   . Hyperparathyroidism, primary ([place])   . Hypertension   . Neuropathy ([place])   . Osteoporosis   . Palpitations   . Scoliosis   . Shortness of breath    with exertion  . Tingling    both hands   Past Surgical History:  Procedure Laterality Date  . ANTERIOR CERVICAL DECOMP/DISCECTOMY FUSION  10/28/2011   Procedure: ANTERIOR CERVICAL DECOMPRESSION/DISCECTOMY FUSION 2 LEVELS;  Surgeon: Elaina Hoops, MD;  Location: [facility];  Service: Neurosurgery;  Laterality: N/A;  . BREAST BIOPSY Right 1992   neg  . BREAST SURGERY     RT BREAST CYST REMOVED 15 YRS AGO   . CATARACT EXTRACTION W/PHACO Right 10/30/2014   Procedure: CATARACT EXTRACTION PHACO AND INTRAOCULAR LENS PLACEMENT (IOC);  Surgeon: Birder Robson, MD;  Location: [facility];  Service: Ophthalmology;  Laterality: Right;  US0:40 AP18.2 CDE7.42  [place] [id number]  . CATARACT EXTRACTION W/PHACO Left 11/27/2014   Procedure: CATARACT EXTRACTION PHACO AND  INTRAOCULAR LENS PLACEMENT (IOC);  Surgeon: Birder Robson, MD;  Location: [facility];  Service: Ophthalmology;  Laterality: Left;  [place] 00:36.9 AP%: 18.9 CDE: 6.96  [place] #  [id number]  . CYSTOCELE REPAIR N/A 05/13/2015   Procedure: ANTERIOR REPAIR (CYSTOCELE);  Surgeon: Brayton Mars, MD;  Location: [facility];  Service: Gynecology;  Laterality: N/A;  . CYSTOSCOPY  05/13/2015   Procedure: CYSTOSCOPY;  Surgeon: Brayton Mars, MD;  Location: [facility];  Service: Gynecology;;  . DILATION AND CURETTAGE OF UTERUS     X2   . JOINT REPLACEMENT     RT HIP  2013  (PARTIAL)   . PARATHYROIDECTOMY Left 08/25/2012   Procedure: PARATHYROIDECTOMY;  Surgeon: Earnstine Regal, MD;  Location: [facility];  Service: General;  Laterality: Left;  . PITUITARY SURGERY     FOR TUMOR 3 YRS   . TOTAL KNEE ARTHROPLASTY Right 07/18/2014   Procedure: TOTAL KNEE ARTHROPLASTY;  Surgeon: Dereck Leep, MD;  Location: [facility];  Service: Orthopedics;  Laterality: Right;  Marland Kitchen VAGINAL HYSTERECTOMY Bilateral 05/13/2015   Procedure: HYSTERECTOMY VAGINAL WITH BILATERAL SALPINGO OOPHERECTOMY;  Surgeon: Brayton Mars, MD;  Location: [facility];  Service: Gynecology;  Laterality: Bilateral;  . VAGINAL HYSTERECTOMY     bso ant repair   Family History  Problem Relation Age  of Onset  . Glaucoma Mother   . Hypertension Mother   . Thyroid disease Mother   . Stroke Father   . Hypertension Father   . Cancer Neg Hx   . Diabetes Neg Hx     Allergies: Sulfamethoxazole-trimethoprim; Citalopram; and Tape Current Outpatient Prescriptions on File Prior to Visit  Medication Sig Dispense Refill  . acetaminophen (TYLENOL) 500 MG tablet Take 1,000 mg by mouth every 6 (six) hours as needed for fever.     Marland Kitchen amLODipine (NORVASC) 5 MG tablet TAKE ONE TABLET EVERY DAY 90 tablet 1  . aspirin EC 81 MG tablet Take 81 mg by mouth daily.    Marland Kitchen atorvastatin (LIPITOR) 40 MG tablet Take 1 tablet (40 mg total) by mouth daily. 30 tablet 5  .  Biotin (BIOTIN MAXIMUM STRENGTH) 10 MG TABS Take 1 tablet by mouth daily.    . Carboxymethylcellulose Sodium (REFRESH TEARS OP) Apply 1 drop to eye as needed (for dry eyes).    . chlorthalidone (HYGROTON) 25 MG tablet TAKE ONE TABLET BY MOUTH EVERY DAY 90 tablet 1  . cholecalciferol (VITAMIN D) 1000 UNITS tablet Take 1,000 Units by mouth daily.     Marland Kitchen docusate sodium (COLACE) 100 MG capsule Take 100 mg by mouth 2 (two) times daily as needed for mild constipation.     . ferrous sulfate 325 (65 FE) MG tablet Take 325 mg by mouth daily.    . Glycerin-Polysorbate 80 (REFRESH DRY EYE THERAPY OP) Apply to eye.    . magnesium oxide (MAG-OX) 400 MG tablet Take 400 mg by mouth daily.    . Multiple Vitamins-Minerals (MULTIVITAMIN PO) Take 1 tablet by mouth daily.    . Omega-3 Fatty Acids (OMEGA 3 PO) Take 1 capsule by mouth 3 (three) times a week.     . pantoprazole (PROTONIX) 40 MG tablet TAKE ONE TABLET BY MOUTH TWICE DAILY 60 tablet 11  . potassium chloride (K-DUR) 10 MEQ tablet TAKE ONE TABLET EVERY DAY 90 tablet 1  . ranitidine (ZANTAC) 150 MG tablet Take 150 mg by mouth 2 (two) times daily.    . valsartan (DIOVAN) 320 MG tablet TAKE ONE TABLET BY MOUTH EVERY DAY 90 tablet 2   No current facility-administered medications on file prior to visit.     Social History  Substance Use Topics  . Smoking status: Former Smoker    Quit date: 02/10/2008  . Smokeless tobacco: Never Used  . Alcohol use 0.0 oz/week     Comment: rare    Review of Systems  Constitutional: Negative for chills and fever.  HENT: Positive for congestion. Negative for ear pain and sore throat.   Respiratory: Positive for cough. Negative for shortness of breath and wheezing.   Cardiovascular: Negative for chest pain and palpitations.  Gastrointestinal: Negative for nausea and vomiting.      Objective:    BP 140/88   Pulse 67   Temp 97.5 F (36.4 C) (Oral)   Ht 5\' 5"  (1.651 m)   Wt 151 lb 9.6 oz (68.8 kg)   SpO2 98%    BMI 25.23 kg/m    Physical Exam  Constitutional: She appears well-developed and well-nourished.  HENT:  Head: Normocephalic and atraumatic.  Right Ear: Hearing, tympanic membrane, external ear and ear canal normal. No drainage, swelling or tenderness. No foreign bodies. Tympanic membrane is not erythematous and not bulging. No middle ear effusion. No decreased hearing is noted.  Left Ear: Hearing, tympanic membrane, external ear and ear canal  normal. No drainage, swelling or tenderness. No foreign bodies. Tympanic membrane is not erythematous and not bulging.  No middle ear effusion. No decreased hearing is noted.  Nose: Nose normal. No rhinorrhea. Right sinus exhibits no maxillary sinus tenderness and no frontal sinus tenderness. Left sinus exhibits no maxillary sinus tenderness and no frontal sinus tenderness.  Mouth/Throat: Uvula is midline, oropharynx is clear and moist and mucous membranes are normal. No oropharyngeal exudate, posterior oropharyngeal edema, posterior oropharyngeal erythema or tonsillar abscesses.  Eyes: Conjunctivae are normal.  Cardiovascular: Regular rhythm, normal heart sounds and normal pulses.   Pulmonary/Chest: Effort normal and breath sounds normal. She has no wheezes. She has no rhonchi. She has no rales.  Lymphadenopathy:       Head (right side): No submental, no submandibular, no tonsillar, no preauricular, no posterior auricular and no occipital adenopathy present.       Head (left side): No submental, no submandibular, no tonsillar, no preauricular, no posterior auricular and no occipital adenopathy present.    She has no cervical adenopathy.  Neurological: She is alert.  Skin: Skin is warm and dry.  Psychiatric: She has a normal mood and affect. Her speech is normal and behavior is normal. Thought content normal.  Vitals reviewed.      Assessment & Plan:   1. Acute bronchitis, unspecified organism Afebrile. No acute respiratory distress. SaO2 within  normal limits. Patient is improving on Claritin. Patient and I jointly agreed to continue current regimen and delay antibiotic therapy. Pending chest x-ray to ensure no aspiration pneumonia due to recent dental work. Return precautions given.   - DG Chest 2 View - benzonatate (TESSALON) 100 MG capsule; Take 1 capsule (100 mg total) by mouth 2 (two) times daily as needed for cough.  Dispense: 20 capsule; Refill: 0    I am having Ms. Thoennes maintain her docusate sodium, cholecalciferol, Omega-3 Fatty Acids (OMEGA 3 PO), acetaminophen, Biotin, Carboxymethylcellulose Sodium (REFRESH TEARS OP), ferrous sulfate, magnesium oxide, Multiple Vitamins-Minerals (MULTIVITAMIN PO), ranitidine, pantoprazole, aspirin EC, valsartan, Glycerin-Polysorbate 80 (REFRESH DRY EYE THERAPY OP), atorvastatin, potassium chloride, chlorthalidone, and amLODipine.   No orders of the defined types were placed in this encounter.   Return precautions given.   Risks, benefits, and alternatives of the medications and treatment plan prescribed today were discussed, and patient expressed understanding.   Education regarding symptom management and diagnosis given to patient on AVS.  Continue to follow with Einar Pheasant, MD for routine health maintenance.   [name] and I agreed with plan.   Mable Paris, FNP

## 2016-01-15 ENCOUNTER — Telehealth: Payer: Self-pay | Admitting: *Deleted

## 2016-01-15 NOTE — Telephone Encounter (Signed)
Patient stated that she had a missed call from [facility] contact [phone]

## 2016-01-15 NOTE — Telephone Encounter (Signed)
Patient has been informed of result. Please see result note

## 2016-01-22 ENCOUNTER — Other Ambulatory Visit: Payer: Self-pay | Admitting: Neurosurgery

## 2016-01-22 DIAGNOSIS — G8929 Other chronic pain: Secondary | ICD-10-CM

## 2016-01-22 DIAGNOSIS — M545 Low back pain: Secondary | ICD-10-CM

## 2016-01-22 DIAGNOSIS — E236 Other disorders of pituitary gland: Secondary | ICD-10-CM

## 2016-01-29 ENCOUNTER — Ambulatory Visit (INDEPENDENT_AMBULATORY_CARE_PROVIDER_SITE_OTHER): Payer: Medicare Other

## 2016-01-29 VITALS — BP 130/70 | HR 64 | Temp 97.5°F | Resp 14 | Ht 65.0 in | Wt 150.1 lb

## 2016-01-29 DIAGNOSIS — Z0000 Encounter for general adult medical examination without abnormal findings: Secondary | ICD-10-CM

## 2016-01-29 NOTE — Patient Instructions (Addendum)
[name] , Thank you for taking time to come for your Medicare Wellness Visit. I appreciate your ongoing commitment to your health goals. Please review the following plan we discussed and let me know if I can assist you in the future.   KEEP UPCOMING APPOINTMENT WITH DR. Joycelyn Man Christmas!!  These are the goals we discussed: Goals    . Prevent Falls       This is a list of the screening recommended for you and due dates:  Health Maintenance  Topic Date Due  .  Hepatitis C: One time screening is recommended by Center for Disease Control  (CDC) for  adults born from [birth year] through 1963.   03/06/1945  . Tetanus Vaccine  03/20/1966  . Shingles Vaccine  03/21/2007  . Pneumonia vaccines (2 of 2 - PPSV23) 05/17/2015  . Mammogram  09/22/2017  . Colon Cancer Screening  09/16/2023  . Flu Shot  Completed  . DEXA scan (bone density measurement)  Completed      Fall Prevention in the Home Introduction Falls can cause injuries. They can happen to people of all ages. There are many things you can do to make your home safe and to help prevent falls. What can I do on the outside of my home?  Regularly fix the edges of walkways and driveways and fix any cracks.  Remove anything that might make you trip as you walk through a door, such as a raised step or threshold.  Trim any bushes or trees on the path to your home.  Use bright outdoor lighting.  Clear any walking paths of anything that might make someone trip, such as rocks or tools.  Regularly check to see if handrails are loose or broken. Make sure that both sides of any steps have handrails.  Any raised decks and porches should have guardrails on the edges.  Have any leaves, snow, or ice cleared regularly.  Use sand or salt on walking paths during winter.  Clean up any spills in your garage right away. This includes oil or grease spills. What can I do in the bathroom?  Use night lights.  Install grab bars by the toilet  and in the tub and shower. Do not use towel bars as grab bars.  Use non-skid mats or decals in the tub or shower.  If you need to sit down in the shower, use a plastic, non-slip stool.  Keep the floor dry. Clean up any water that spills on the floor as soon as it happens.  Remove soap buildup in the tub or shower regularly.  Attach bath mats securely with double-sided non-slip rug tape.  Do not have throw rugs and other things on the floor that can make you trip. What can I do in the bedroom?  Use night lights.  Make sure that you have a light by your bed that is easy to reach.  Do not use any sheets or blankets that are too big for your bed. They should not hang down onto the floor.  Have a firm chair that has side arms. You can use this for support while you get dressed.  Do not have throw rugs and other things on the floor that can make you trip. What can I do in the kitchen?  Clean up any spills right away.  Avoid walking on wet floors.  Keep items that you use a lot in easy-to-reach places.  If you need to reach something above you, use a  strong step stool that has a grab bar.  Keep electrical cords out of the way.  Do not use floor polish or wax that makes floors slippery. If you must use wax, use non-skid floor wax.  Do not have throw rugs and other things on the floor that can make you trip. What can I do with my stairs?  Do not leave any items on the stairs.  Make sure that there are handrails on both sides of the stairs and use them. Fix handrails that are broken or loose. Make sure that handrails are as long as the stairways.  Check any carpeting to make sure that it is firmly attached to the stairs. Fix any carpet that is loose or worn.  Avoid having throw rugs at the top or bottom of the stairs. If you do have throw rugs, attach them to the floor with carpet tape.  Make sure that you have a light switch at the top of the stairs and the bottom of the  stairs. If you do not have them, ask someone to add them for you. What else can I do to help prevent falls?  Wear shoes that:  Do not have high heels.  Have rubber bottoms.  Are comfortable and fit you well.  Are closed at the toe. Do not wear sandals.  If you use a stepladder:  Make sure that it is fully opened. Do not climb a closed stepladder.  Make sure that both sides of the stepladder are locked into place.  Ask someone to hold it for you, if possible.  Clearly mark and make sure that you can see:  Any grab bars or handrails.  First and last steps.  Where the edge of each step is.  Use tools that help you move around (mobility aids) if they are needed. These include:  Canes.  Walkers.  Scooters.  Crutches.  Turn on the lights when you go into a dark area. Replace any light bulbs as soon as they burn out.  Set up your furniture so you have a clear path. Avoid moving your furniture around.  If any of your floors are uneven, fix them.  If there are any pets around you, be aware of where they are.  Review your medicines with your doctor. Some medicines can make you feel dizzy. This can increase your chance of falling. Ask your doctor what other things that you can do to help prevent falls. This information is not intended to replace advice given to you by your health care provider. Make sure you discuss any questions you have with your health care provider. Document Released: 11/22/2008 Document Revised: 07/04/2015 Document Reviewed: 03/02/2014  2017 [employer]

## 2016-01-29 NOTE — Progress Notes (Signed)
Subjective:   [name] is a 70 y.o. female who presents for Medicare Annual (Subsequent) preventive examination.  Review of Systems:  No ROS.  Medicare Wellness Visit.  Cardiac Risk Factors include: advanced age (>93men, >11 women);hypertension     Objective:     Vitals: BP 130/70 (BP Location: Right Arm, Patient Position: Sitting, Cuff Size: Normal)   Pulse 64   Temp 97.5 F (36.4 C) (Oral)   Resp 14   Ht 5\' 5"  (1.651 m)   Wt 150 lb 1.9 oz (68.1 kg)   SpO2 95%   BMI 24.98 kg/m   Body mass index is 24.98 kg/m.   Tobacco History  Smoking Status  . Former Smoker  . Quit date: 02/10/2008  Smokeless Tobacco  . Never Used     Counseling given: Not Answered   Past Medical History:  Diagnosis Date  . Anemia   . Anxiety   . Arthritis   . Burning feet syndrome   . Chronic insomnia   . Complication of anesthesia    "AFFECTS MY MEMORY"  . COPD (chronic obstructive pulmonary disease) ([place])    documented in outside records  . Depression   . Dysrhythmia    PALPITATIONS   . Edema    MILD ANKLE  . GERD (gastroesophageal reflux disease)   . Heart murmur   . History of stomach ulcers   . HOH (hard of hearing)    AIDS  . Hypercholesterolemia   . Hyperparathyroidism, primary ([place])   . Hypertension   . Neuropathy ([place])   . Osteoporosis   . Palpitations   . Scoliosis   . Shortness of breath    with exertion  . Tingling    both hands   Past Surgical History:  Procedure Laterality Date  . ANTERIOR CERVICAL DECOMP/DISCECTOMY FUSION  10/28/2011   Procedure: ANTERIOR CERVICAL DECOMPRESSION/DISCECTOMY FUSION 2 LEVELS;  Surgeon: Elaina Hoops, MD;  Location: [facility];  Service: Neurosurgery;  Laterality: N/A;  . BREAST BIOPSY Right 1992   neg  . BREAST SURGERY     RT BREAST CYST REMOVED 15 YRS AGO   . CATARACT EXTRACTION W/PHACO Right 10/30/2014   Procedure: CATARACT EXTRACTION PHACO AND INTRAOCULAR LENS PLACEMENT (IOC);  Surgeon: Birder Robson, MD;   Location: [facility];  Service: Ophthalmology;  Laterality: Right;  US0:40 AP18.2 CDE7.42  [place] [id number]  . CATARACT EXTRACTION W/PHACO Left 11/27/2014   Procedure: CATARACT EXTRACTION PHACO AND INTRAOCULAR LENS PLACEMENT (IOC);  Surgeon: Birder Robson, MD;  Location: [facility];  Service: Ophthalmology;  Laterality: Left;  [place] 00:36.9 AP%: 18.9 CDE: 6.96  cas[place] lot #  [id number]  . CYSTOCELE REPAIR N/A 05/13/2015   Procedure: ANTERIOR REPAIR (CYSTOCELE);  Surgeon: Brayton Mars, MD;  Location: [facility];  Service: Gynecology;  Laterality: N/A;  . CYSTOSCOPY  05/13/2015   Procedure: CYSTOSCOPY;  Surgeon: Brayton Mars, MD;  Location: [facility];  Service: Gynecology;;  . DILATION AND CURETTAGE OF UTERUS     X2   . JOINT REPLACEMENT     RT HIP  2013  (PARTIAL)   . PARATHYROIDECTOMY Left 08/25/2012   Procedure: PARATHYROIDECTOMY;  Surgeon: Earnstine Regal, MD;  Location: [facility];  Service: General;  Laterality: Left;  . PITUITARY SURGERY     FOR TUMOR 3 YRS   . TOTAL KNEE ARTHROPLASTY Right 07/18/2014   Procedure: TOTAL KNEE ARTHROPLASTY;  Surgeon: Dereck Leep, MD;  Location: [facility];  Service: Orthopedics;  Laterality: Right;  .  VAGINAL HYSTERECTOMY Bilateral 05/13/2015   Procedure: HYSTERECTOMY VAGINAL WITH BILATERAL SALPINGO OOPHERECTOMY;  Surgeon: Brayton Mars, MD;  Location: [facility];  Service: Gynecology;  Laterality: Bilateral;  . VAGINAL HYSTERECTOMY     bso ant repair   Family History  Problem Relation Age of Onset  . Glaucoma Mother   . Hypertension Mother   . Thyroid disease Mother   . Stroke Father   . Hypertension Father   . Cancer Neg Hx   . Diabetes Neg Hx    History  Sexual Activity  . Sexual activity: Not Currently  . Birth control/ protection: Post-menopausal    Outpatient Encounter Prescriptions as of 01/29/2016  Medication Sig  . acetaminophen (TYLENOL) 500 MG tablet Take 1,000 mg by mouth every 6 (six) hours as needed for fever.   Marland Kitchen  amLODipine (NORVASC) 5 MG tablet TAKE ONE TABLET EVERY DAY  . aspirin EC 81 MG tablet Take 81 mg by mouth daily.  Marland Kitchen atorvastatin (LIPITOR) 40 MG tablet Take 1 tablet (40 mg total) by mouth daily.  . benzonatate (TESSALON) 100 MG capsule Take 1 capsule (100 mg total) by mouth 2 (two) times daily as needed for cough.  . Biotin (BIOTIN MAXIMUM STRENGTH) 10 MG TABS Take 1 tablet by mouth daily.  . chlorthalidone (HYGROTON) 25 MG tablet TAKE ONE TABLET BY MOUTH EVERY DAY  . cholecalciferol (VITAMIN D) 1000 UNITS tablet Take 1,000 Units by mouth daily.   Marland Kitchen docusate sodium (COLACE) 100 MG capsule Take 100 mg by mouth 2 (two) times daily as needed for mild constipation.   . ferrous sulfate 325 (65 FE) MG tablet Take 325 mg by mouth daily.  . Glycerin-Polysorbate 80 (REFRESH DRY EYE THERAPY OP) Apply to eye.  . magnesium oxide (MAG-OX) 400 MG tablet Take 400 mg by mouth daily.  . Multiple Vitamins-Minerals (MULTIVITAMIN PO) Take 1 tablet by mouth daily.  . pantoprazole (PROTONIX) 40 MG tablet TAKE ONE TABLET BY MOUTH TWICE DAILY  . Polyethyl Glycol-Propyl Glycol (SYSTANE OP) Apply 1 drop to eye once.  . potassium chloride (K-DUR) 10 MEQ tablet TAKE ONE TABLET EVERY DAY  . Probiotic Product (FLORAJEN3 PO) Take 1 tablet by mouth.  . ranitidine (ZANTAC) 150 MG tablet Take 150 mg by mouth 2 (two) times daily.  . valsartan (DIOVAN) 320 MG tablet TAKE ONE TABLET BY MOUTH EVERY DAY  . [DISCONTINUED] Carboxymethylcellulose Sodium (REFRESH TEARS OP) Apply 1 drop to eye as needed (for dry eyes).  . [DISCONTINUED] Omega-3 Fatty Acids (OMEGA 3 PO) Take 1 capsule by mouth 3 (three) times a week.    No facility-administered encounter medications on file as of 01/29/2016.     Activities of Daily Living In your present state of health, do you have any difficulty performing the following activities: 01/29/2016 05/08/2015  Hearing? Tempie Donning  Vision? N Y  Difficulty concentrating or making decisions? Tempie Donning  Walking or  climbing stairs? N N  Dressing or bathing? N N  Doing errands, shopping? N N  Preparing Food and eating ? N -  Using the Toilet? N -  In the past six months, have you accidently leaked urine? N -  Do you have problems with loss of bowel control? N -  Managing your Medications? N -  Managing your Finances? N -  Housekeeping or managing your Housekeeping? N -  Some recent data might be hidden    Patient Care Team: Einar Pheasant, MD as PCP - General (Internal Medicine) Dereck Leep, MD (  Orthopedic Surgery)    Assessment:    This is a routine wellness examination for Zaelia. The goal of the wellness visit is to assist the patient how to close the gaps in care and create a preventative care plan for the patient.   Taking calcium VIT D as appropriate/Osteoporosis reviewed.  Medications reviewed; taking without issues or barriers.  Safety issues reviewed; lives alone. Smoke detectors in the home. No firearms in the home. Wears seatbelts when driving or riding with others. No violence in the home.  No identified risk were noted; The patient was oriented x 3; appropriate in dress and manner and no objective failures at ADL's or IADL's.   BMI; discussed the importance of a healthy diet, water intake and exercise. Educational material provided.  HTN; followed by PCP.  Hepatitis C screening discussed; deferred per patient request.  Educational material provided.  Pneumovax 23 vaccine postponed per patient not feeling well.  Educational materia provided.  TDAP and ZOSTAVAX vaccine deferred for follow up with insurance.   Patient Concerns: None at this time. Follow up with PCP as needed. Exercise Activities and Dietary recommendations Current Exercise Habits: Home exercise routine, Type of exercise: walking, Frequency (Times/Week): 7, Intensity: Mild  Goals    . Prevent Falls      Fall Risk Fall Risk  01/29/2016 10/11/2015 06/18/2014 05/17/2014 12/20/2012  Falls in the  past year? No No No Yes No  Number falls in past yr: - - - 2 or more -  Injury with Fall? - - - No -  Risk for fall due to : - - - Impaired balance/gait -   Depression Screen PHQ 2/9 Scores 01/29/2016 10/11/2015 06/18/2014 05/17/2014  PHQ - 2 Score 2 3 2  0  PHQ- 9 Score 6 14 8  -     Cognitive Function MMSE - Mini Mental State Exam 06/18/2014  Orientation to time 5  Orientation to Place 5  Registration 3  Attention/ Calculation 5  Recall 3  Language- name 2 objects 2  Language- repeat 1  Language- follow 3 step command 3  Language- read & follow direction 1  Write a sentence 1  Copy design 1  Total score 30     6CIT Screen 01/29/2016  What Year? 0 points  What month? 0 points  What time? 0 points  Count back from 20 0 points  Months in reverse 0 points    Immunization History  Administered Date(s) Administered  . Influenza Split 02/12/2012  . Influenza, High Dose Seasonal PF 10/11/2015  . Influenza,inj,Quad PF,36+ Mos 10/24/2012, 11/28/2013, 12/17/2014  . Pneumococcal Conjugate-13 05/17/2014   Screening Tests Health Maintenance  Topic Date Due  . Hepatitis C Screening  1945/09/20  . TETANUS/TDAP  03/20/1966  . ZOSTAVAX  03/21/2007  . PNA vac Low Risk Adult (2 of 2 - PPSV23) 05/17/2015  . MAMMOGRAM  09/22/2017  . COLONOSCOPY  09/16/2023  . INFLUENZA VACCINE  Completed  . DEXA SCAN  Completed      Plan:    End of life planning; Advance aging; Advanced directives discussed. Copy of current HCPOA/Living Will requested.  Medicare Attestation I have personally reviewed: The patient's medical and social history Their use of alcohol, tobacco or illicit drugs Their current medications and supplements The patient's functional ability including ADLs,fall risks, home safety risks, cognitive, and hearing and visual impairment Diet and physical activities Evidence for depression   The patient's weight, height, BMI, and visual acuity have been recorded in the chart.  I  have made referrals and provided education to the patient based on review of the above and I have provided the patient with a written personalized care plan for preventive services.    During the course of the visit the patient was educated and counseled about the following appropriate screening and preventive services:   Vaccines to include Pneumoccal, Influenza, Hepatitis B, Td, Zostavax, HCV  Electrocardiogram  Cardiovascular Disease  Colorectal cancer screening  Bone density screening  Diabetes screening  Glaucoma screening  Mammography/PAP  Nutrition counseling   Patient Instructions (the written plan) was given to the patient.   Varney Biles, LPN  [phone]

## 2016-01-30 ENCOUNTER — Other Ambulatory Visit: Payer: Self-pay | Admitting: Internal Medicine

## 2016-02-07 NOTE — Progress Notes (Signed)
I have reviewed the above note and agree.  Eric Sonnenberg, M.D.  

## 2016-02-11 ENCOUNTER — Ambulatory Visit
Admission: RE | Admit: 2016-02-11 | Discharge: 2016-02-11 | Disposition: A | Discharge status: Home / Self Care | Payer: Medicare Other | Source: Ambulatory Visit | Attending: Neurosurgery | Admitting: Neurosurgery

## 2016-02-11 DIAGNOSIS — E236 Other disorders of pituitary gland: Secondary | ICD-10-CM

## 2016-02-11 DIAGNOSIS — M545 Low back pain: Secondary | ICD-10-CM | POA: Diagnosis not present

## 2016-02-11 DIAGNOSIS — M2578 Osteophyte, vertebrae: Secondary | ICD-10-CM | POA: Diagnosis not present

## 2016-02-11 DIAGNOSIS — G8929 Other chronic pain: Secondary | ICD-10-CM

## 2016-02-11 DIAGNOSIS — D352 Benign neoplasm of pituitary gland: Secondary | ICD-10-CM | POA: Diagnosis not present

## 2016-02-11 DIAGNOSIS — M5126 Other intervertebral disc displacement, lumbar region: Secondary | ICD-10-CM | POA: Insufficient documentation

## 2016-02-11 LAB — POCT I-STAT CREATININE: Creatinine, Ser: 0.8 mg/dL (ref 0.44–1.00)

## 2016-02-11 MED ORDER — GADOBENATE DIMEGLUMINE 529 MG/ML IV SOLN
10.0000 mL | Freq: Once | INTRAVENOUS | Status: AC | PRN
  Administered 2016-02-11: 10 mL via INTRAVENOUS

## 2016-02-12 DIAGNOSIS — G8929 Other chronic pain: Secondary | ICD-10-CM | POA: Diagnosis not present

## 2016-02-12 DIAGNOSIS — M5126 Other intervertebral disc displacement, lumbar region: Secondary | ICD-10-CM | POA: Diagnosis not present

## 2016-02-12 DIAGNOSIS — D352 Benign neoplasm of pituitary gland: Secondary | ICD-10-CM | POA: Diagnosis not present

## 2016-02-12 DIAGNOSIS — E236 Other disorders of pituitary gland: Secondary | ICD-10-CM | POA: Diagnosis not present

## 2016-02-12 DIAGNOSIS — M2578 Osteophyte, vertebrae: Secondary | ICD-10-CM | POA: Diagnosis not present

## 2016-02-12 DIAGNOSIS — M545 Low back pain: Secondary | ICD-10-CM | POA: Diagnosis not present

## 2016-02-17 ENCOUNTER — Other Ambulatory Visit: Payer: Self-pay | Admitting: Internal Medicine

## 2016-02-28 ENCOUNTER — Ambulatory Visit: Payer: Medicare Other | Admitting: Internal Medicine

## 2016-04-03 ENCOUNTER — Other Ambulatory Visit: Payer: Self-pay | Admitting: Internal Medicine

## 2016-04-10 ENCOUNTER — Other Ambulatory Visit: Payer: Self-pay | Admitting: Internal Medicine

## 2016-04-14 ENCOUNTER — Other Ambulatory Visit: Payer: Self-pay | Admitting: Internal Medicine

## 2016-04-16 DIAGNOSIS — M545 Low back pain: Secondary | ICD-10-CM | POA: Diagnosis not present

## 2016-04-16 DIAGNOSIS — D352 Benign neoplasm of pituitary gland: Secondary | ICD-10-CM | POA: Diagnosis not present

## 2016-04-16 DIAGNOSIS — M419 Scoliosis, unspecified: Secondary | ICD-10-CM | POA: Diagnosis not present

## 2016-05-12 ENCOUNTER — Ambulatory Visit (INDEPENDENT_AMBULATORY_CARE_PROVIDER_SITE_OTHER): Payer: Medicare Other | Admitting: Internal Medicine

## 2016-05-12 ENCOUNTER — Encounter: Payer: Self-pay | Admitting: Internal Medicine

## 2016-05-12 VITALS — BP 132/70 | HR 62 | Temp 98.2°F | Resp 12 | Ht 65.0 in | Wt 150.2 lb

## 2016-05-12 DIAGNOSIS — Z90710 Acquired absence of both cervix and uterus: Secondary | ICD-10-CM

## 2016-05-12 DIAGNOSIS — R739 Hyperglycemia, unspecified: Secondary | ICD-10-CM | POA: Diagnosis not present

## 2016-05-12 DIAGNOSIS — M51369 Other intervertebral disc degeneration, lumbar region without mention of lumbar back pain or lower extremity pain: Secondary | ICD-10-CM

## 2016-05-12 DIAGNOSIS — F32A Depression, unspecified: Secondary | ICD-10-CM

## 2016-05-12 DIAGNOSIS — I10 Essential (primary) hypertension: Secondary | ICD-10-CM

## 2016-05-12 DIAGNOSIS — F329 Major depressive disorder, single episode, unspecified: Secondary | ICD-10-CM | POA: Diagnosis not present

## 2016-05-12 DIAGNOSIS — E210 Primary hyperparathyroidism: Secondary | ICD-10-CM

## 2016-05-12 DIAGNOSIS — F419 Anxiety disorder, unspecified: Secondary | ICD-10-CM

## 2016-05-12 DIAGNOSIS — M5136 Other intervertebral disc degeneration, lumbar region: Secondary | ICD-10-CM

## 2016-05-12 DIAGNOSIS — E7800 Pure hypercholesterolemia, unspecified: Secondary | ICD-10-CM

## 2016-05-12 DIAGNOSIS — G629 Polyneuropathy, unspecified: Secondary | ICD-10-CM

## 2016-05-12 NOTE — Progress Notes (Signed)
Pre-visit discussion using our clinic review tool. No additional management support is needed unless otherwise documented below in the visit note.  

## 2016-05-12 NOTE — Progress Notes (Signed)
Patient ID: [name], female   DOB: April 04, 1945, 71 y.o.   MRN: [id number]   Subjective:    Patient ID: [name], female    DOB: May 07, 1945, 71 y.o.   MRN: [id number]  HPI  Patient here for a scheduled follow up.  She has been been having increased pain in her feet and extending up her legs.  Neuropathic pain.  Has tried gabapentin and lyrica.  Still with pain.  Also feels having left foot drop when walking.  No headache.  No dizziness.  No chest pain.  No sob.  No acid reflux.  No abdominal pain.  Bowels moving.  Doing well from her vaginal surgery.     Past Medical History:  Diagnosis Date  . Anemia   . Anxiety   . Arthritis   . Burning feet syndrome   . Chronic insomnia   . Complication of anesthesia    "AFFECTS MY MEMORY"  . COPD (chronic obstructive pulmonary disease) ([place])    documented in outside records  . Depression   . Dysrhythmia    PALPITATIONS   . Edema    MILD ANKLE  . GERD (gastroesophageal reflux disease)   . Heart murmur   . History of stomach ulcers   . HOH (hard of hearing)    AIDS  . Hypercholesterolemia   . Hyperparathyroidism, primary ([place])   . Hypertension   . Neuropathy ([place])   . Osteoporosis   . Palpitations   . Scoliosis   . Shortness of breath    with exertion  . Tingling    both hands   Past Surgical History:  Procedure Laterality Date  . ANTERIOR CERVICAL DECOMP/DISCECTOMY FUSION  10/28/2011   Procedure: ANTERIOR CERVICAL DECOMPRESSION/DISCECTOMY FUSION 2 LEVELS;  Surgeon: Elaina Hoops, MD;  Location: [facility];  Service: Neurosurgery;  Laterality: N/A;  . BREAST BIOPSY Right 1992   neg  . BREAST SURGERY     RT BREAST CYST REMOVED 15 YRS AGO   . CATARACT EXTRACTION W/PHACO Right 10/30/2014   Procedure: CATARACT EXTRACTION PHACO AND INTRAOCULAR LENS PLACEMENT (IOC);  Surgeon: Birder Robson, MD;  Location: [facility];  Service: Ophthalmology;  Laterality: Right;  US0:40 AP18.2 CDE7.42  casette lot #[id number]  . CATARACT  EXTRACTION W/PHACO Left 11/27/2014   Procedure: CATARACT EXTRACTION PHACO AND INTRAOCULAR LENS PLACEMENT (IOC);  Surgeon: Birder Robson, MD;  Location: [facility];  Service: Ophthalmology;  Laterality: Left;  [place] 00:36.9 AP%: 18.9 CDE: 6.96  cas[place] lot #  [id number]  . CYSTOCELE REPAIR N/A 05/13/2015   Procedure: ANTERIOR REPAIR (CYSTOCELE);  Surgeon: Brayton Mars, MD;  Location: [facility];  Service: Gynecology;  Laterality: N/A;  . CYSTOSCOPY  05/13/2015   Procedure: CYSTOSCOPY;  Surgeon: Brayton Mars, MD;  Location: [facility];  Service: Gynecology;;  . DILATION AND CURETTAGE OF UTERUS     X2   . JOINT REPLACEMENT     RT HIP  2013  (PARTIAL)   . PARATHYROIDECTOMY Left 08/25/2012   Procedure: PARATHYROIDECTOMY;  Surgeon: Earnstine Regal, MD;  Location: [facility];  Service: General;  Laterality: Left;  . PITUITARY SURGERY     FOR TUMOR 3 YRS   . TOTAL KNEE ARTHROPLASTY Right 07/18/2014   Procedure: TOTAL KNEE ARTHROPLASTY;  Surgeon: Dereck Leep, MD;  Location: [facility];  Service: Orthopedics;  Laterality: Right;  Marland Kitchen VAGINAL HYSTERECTOMY Bilateral 05/13/2015   Procedure: HYSTERECTOMY VAGINAL WITH BILATERAL SALPINGO OOPHERECTOMY;  Surgeon: Brayton Mars, MD;  [facility];  Service: Gynecology;  Laterality: Bilateral;  . VAGINAL HYSTERECTOMY     bso ant repair   Family History  Problem Relation Age of Onset  . Glaucoma Mother   . Hypertension Mother   . Thyroid disease Mother   . Stroke Father   . Hypertension Father   . Cancer Neg Hx   . Diabetes Neg Hx    Social History   Social History  . Marital status: Divorced    Spouse name: N/A  . Number of children: N/A  . Years of education: N/A   Social History Main Topics  . Smoking status: Former Smoker    Quit date: 02/10/2008  . Smokeless tobacco: Never Used  . Alcohol use 0.0 oz/week     Comment: rare  . Drug use: No  . Sexual activity: Not Currently    Birth control/ protection: Post-menopausal   Other  Topics Concern  . None   Social History Narrative  . None    Outpatient Encounter Prescriptions as of 05/12/2016  Medication Sig  . acetaminophen (TYLENOL) 500 MG tablet Take 1,000 mg by mouth every 6 (six) hours as needed for fever.   Marland Kitchen amLODipine (NORVASC) 5 MG tablet Take 1 tablet (5 mg total) by mouth daily.  Marland Kitchen aspirin EC 81 MG tablet Take 81 mg by mouth daily.  Marland Kitchen atorvastatin (LIPITOR) 40 MG tablet Take 1 tablet (40 mg total) by mouth daily.  . Biotin (BIOTIN MAXIMUM STRENGTH) 10 MG TABS Take 1 tablet by mouth daily.  . chlorthalidone (HYGROTON) 25 MG tablet Take 1 tablet (25 mg total) by mouth daily.  . cholecalciferol (VITAMIN D) 1000 UNITS tablet Take 1,000 Units by mouth daily.   Marland Kitchen docusate sodium (COLACE) 100 MG capsule Take 100 mg by mouth 2 (two) times daily as needed for mild constipation.   . ferrous sulfate 325 (65 FE) MG tablet Take 325 mg by mouth daily.  . Glycerin-Polysorbate 80 (REFRESH DRY EYE THERAPY OP) Apply to eye.  . magnesium oxide (MAG-OX) 400 MG tablet Take 400 mg by mouth daily.  . Multiple Vitamins-Minerals (MULTIVITAMIN PO) Take 1 tablet by mouth daily.  . pantoprazole (PROTONIX) 40 MG tablet TAKE ONE TABLET TWICE DAILY  . Polyethyl Glycol-Propyl Glycol (SYSTANE OP) Apply 1 drop to eye once.  . potassium chloride (K-DUR) 10 MEQ tablet Take 1 tablet (10 mEq total) by mouth daily.  . Probiotic Product (FLORAJEN3 PO) Take 1 tablet by mouth.  . ranitidine (ZANTAC) 150 MG tablet Take 150 mg by mouth 2 (two) times daily.  . valsartan (DIOVAN) 320 MG tablet Take 1 tablet (320 mg total) by mouth daily.  . [DISCONTINUED] benzonatate (TESSALON) 100 MG capsule Take 1 capsule (100 mg total) by mouth 2 (two) times daily as needed for cough.  . [DISCONTINUED] amLODipine (NORVASC) 5 MG tablet TAKE ONE TABLET EVERY DAY  . [DISCONTINUED] atorvastatin (LIPITOR) 40 MG tablet TAKE 1 TABLET BY MOUTH DAILY  . [DISCONTINUED] chlorthalidone (HYGROTON) 25 MG tablet TAKE ONE TABLET  BY MOUTH EVERY DAY  . [DISCONTINUED] potassium chloride (K-DUR) 10 MEQ tablet TAKE 1 TABLET BY MOUTH EVERY DAY  . [DISCONTINUED] valsartan (DIOVAN) 320 MG tablet TAKE ONE TABLET BY MOUTH EVERY DAY   No facility-administered encounter medications on file as of 05/12/2016.     Review of Systems  Constitutional: Negative for appetite change and unexpected weight change.  HENT: Negative for congestion and sinus pressure.   Respiratory: Negative for cough, chest tightness and shortness of breath.  Cardiovascular: Negative for chest pain, palpitations and leg swelling.  Gastrointestinal: Negative for abdominal pain, diarrhea, nausea and vomiting.  Genitourinary: Negative for difficulty urinating and dysuria.  Musculoskeletal:       Leg and feet pain as outlined.  Feels left foot "drags" some with walking.    Skin: Negative for color change and rash.  Neurological: Negative for dizziness, light-headedness and headaches.  Psychiatric/Behavioral: Negative for agitation and dysphoric mood.       Objective:    Physical Exam  Constitutional: She appears well-developed and well-nourished. No distress.  HENT:  Nose: Nose normal.  Mouth/Throat: Oropharynx is clear and moist.  Neck: Neck supple. No thyromegaly present.  Cardiovascular: Normal rate and regular rhythm.   Pulmonary/Chest: Breath sounds normal. No respiratory distress. She has no wheezes.  Abdominal: Soft. Bowel sounds are normal. There is no tenderness.  Musculoskeletal: She exhibits no edema or tenderness.  Dorsiflexion and plantar flexion appears to be intact.  No focal weakness.   Lymphadenopathy:    She has no cervical adenopathy.  Skin: No rash noted. No erythema.  Psychiatric: She has a normal mood and affect. Her behavior is normal.    BP 132/70 (BP Location: Left Arm, Patient Position: Sitting, Cuff Size: Normal)   Pulse 62   Temp 98.2 F (36.8 C) (Oral)   Resp 12   Ht 5\' 5"  (1.651 m)   Wt 150 lb 3.2 oz (68.1 kg)    SpO2 98%   BMI 24.99 kg/m  Wt Readings from Last 3 Encounters:  05/12/16 150 lb 3.2 oz (68.1 kg)  01/29/16 150 lb 1.9 oz (68.1 kg)  01/14/16 151 lb 9.6 oz (68.8 kg)     Lab Results  Component Value Date   WBC 7.7 05/08/2015   HGB 10.6 (L) 05/14/2015   HCT 40.0 05/13/2015   PLT 350 05/08/2015   GLUCOSE 95 12/24/2015   CHOL 190 12/24/2015   TRIG 130.0 12/24/2015   HDL 63.90 12/24/2015   LDLCALC 100 (H) 12/24/2015   ALT 18 12/24/2015   AST 18 12/24/2015   NA 142 12/24/2015   K 3.4 (L) 12/24/2015   CL 102 12/24/2015   CREATININE 0.80 02/11/2016   BUN 18 12/24/2015   CO2 32 12/24/2015   TSH 1.11 08/08/2015   INR 0.91 07/05/2014   HGBA1C 5.5 08/08/2015    Mr Brain W Wo Contrast  Result Date: 02/11/2016 CLINICAL DATA:  Follow-up exam for pituitary microadenoma. Patient with new onset difficulty with balance, trouble walking, ringing in ears. EXAM: MRI HEAD WITHOUT AND WITH CONTRAST TECHNIQUE: Multiplanar, multiecho pulse sequences of the brain and surrounding structures were obtained without and with intravenous contrast. Dedicated pituitary protocol was employed. CONTRAST:  52mL MULTIHANCE GADOBENATE DIMEGLUMINE 529 MG/ML IV SOLN COMPARISON:  Previous MRI from 04/20/2011. FINDINGS: Brain: Mild diffuse prominence of the CSF containing spaces is compatible with generalized cerebral atrophy, normal for age. Mild patchy T2/FLAIR hyperintensity within the periventricular and deep white matter both cerebral hemispheres present, most likely related to chronic small vessel ischemic disease, mild for age. Overall, changes are similar to previous. No abnormal foci of restricted diffusion to suggest acute or subacute ischemia. Gray-white matter differentiation is maintained. No evidence for acute or chronic intracranial hemorrhage. No encephalomalacia to suggest chronic infarction. Again seen is a small well-circumscribed T2 hypointense nonenhancing lesion centered at the third ventricle,  measuring 9 x 5 x 6 mm on today's study (series 6, image 14). Finding may reflect a small colloid cysts, and is  similar to previous. Ventricles within normal limits without hydrocephalus. No midline shift or mass effect. No other mass lesion or abnormal enhancement elsewhere within the brain. Major dural sinuses are patent. No extra-axial fluid collection. Dynamic imaging through the pituitary gland and sella was performed. Again seen is a relatively hypoenhancing pituitary microadenoma centered at the right aspect of the sella, measuring 11 x 8 x 12 mm on today's study (transverse by craniocaudad by AP). Overall, lesion is not significantly changed in size as compared to prior study. Evidence of cavernous sinus invasion again seen on the right. Pituitary stalk is displaced to the left. Suprasellar expansion with extension into the suprasellar cistern seen without significant mass effect on the optic chiasm. Overall, changes relatively similar to previous. Vascular: Normal intracranial vascular flow voids are maintained. Skull and upper cervical spine: Craniocervical junction within normal limits. Sequela prior ACDF noted within the partially visualized upper cervical spine. Bone marrow signal intensity within normal limits. No scalp soft tissue abnormality. Sinuses/Orbits: Globes and orbital soft tissues within normal limits. Patient is status post cataract extraction bilaterally. Scattered mucosal thickening throughout the paranasal sinuses, likely allergic/ inflammatory nature. No air-fluid level to suggest active sinus infection. No mastoid effusion. Inner ear structures within normal limits. IMPRESSION: 1. No significant interval change in size and appearance of right-sided pituitary microadenoma as detailed above. 2. No other acute intracranial process identified. 3. Mild chronic microvascular ischemic disease, stable. Electronically Signed   By: Jeannine Boga M.D.   On: 02/11/2016 14:50   Mr Lumbar  Spine Wo Contrast  Result Date: 02/11/2016 CLINICAL DATA:  Multiple falls. Low back pain with bilateral leg and foot pain. EXAM: MRI LUMBAR SPINE WITHOUT CONTRAST TECHNIQUE: Multiplanar, multisequence MR imaging of the lumbar spine was performed. No intravenous contrast was administered. COMPARISON:  Radiography 12/31/2015.  MRI 05/02/2014. FINDINGS: Segmentation:  5 lumbar type vertebral bodies Alignment:  Curvature convex to the left with the apex at L3. Vertebrae: No fracture or primary bone lesion. Chronic discogenic endplate marrow changes. Conus medullaris: Extends to the L1-2 level and appears normal. Paraspinal and other soft tissues: No significant finding. Disc levels: T11-12 and T12-L1:  Unremarkable. L1-2:  Mild bulging of the disc.  No canal or foraminal stenosis. L2-3: Disc degeneration with endplate osteophytes and bulging of the disc more prominent towards the right. Mild facet arthropathy. Mild narrowing of the right lateral recess and intervertebral foramen on the right without definite neural compression. L3-4: Chronic disc degeneration with loss of disc height. Endplate osteophytes and mild bulging of the disc. Mild facet hypertrophy. Mild narrowing of the lateral recesses and foramina, more on the left, without visible neural compression. L4-5: Disc degeneration with shallow protrusion and slight caudal migration. Degenerative desiccation of the migrated disc material. No significant encroachment upon the spinal canal. Mild facet hypertrophy on the left. Mild narrowing of the left lateral recess and intervertebral foramen on the left without visible neural compression. L5-S1: Mild bulging of the disc.  No stenosis. IMPRESSION: No progressive disease since 2016. Curvature convex to the left with the apex at L2. Right lateral recess and foraminal narrowing at L2-3 because of osteophytes and bulging disc material. No distinct neural compression. Mild lateral recess and foraminal narrowing at L3-4  without visible neural compression. Shallow disc protrusion at L4-5 with caudal migration. Mild facet hypertrophy on the left. Mild narrowing of the left lateral recess and intervertebral foramen on the left but no visible neural compression. Caudally migrated disc material has desiccated and involuted  slightly. Electronically Signed   By: Nelson Chimes M.D.   On: 02/11/2016 13:24       Assessment & Plan:   Problem List Items Addressed This Visit    Anxiety    No longer seeing psychiatry.  Discussed seeing a counselor.  Desires no further medication.  Follow.       DDD (degenerative disc disease), lumbar    Has seen Dr Arnoldo Morale.  Could be contributing some to the leg pain.  f/u with Dr Manuella Ghazi as outlined.  Keep f/u with Dr Arnoldo Morale.       Relevant Orders   Ambulatory referral to Neurology   Depression    No longer seeing psychiatry.  Discussed seeing a counselor.  Will notify me if agreeable.  Desires no medication.        Hypercholesterolemia    On lipitor.  Low cholesterol diet and exercise.  Follow lipid panel and liver function tests.        Relevant Medications   amLODipine (NORVASC) 5 MG tablet   atorvastatin (LIPITOR) 40 MG tablet   chlorthalidone (HYGROTON) 25 MG tablet   valsartan (DIOVAN) 320 MG tablet   Other Relevant Orders   Lipid panel   Hepatic function panel   Hyperparathyroidism, primary ([place])    s/p surgery.  Followed by Dr Harlow Asa.  Follow calcium level.        Hypertension    Blood pressure as outlined.  Increased some on my recheck.  Have her spot check her pressure.  Same medication regimen.  Get her back in soon to reassess.  Follow metabolic panel.        Relevant Medications   amLODipine (NORVASC) 5 MG tablet   atorvastatin (LIPITOR) 40 MG tablet   chlorthalidone (HYGROTON) 25 MG tablet   valsartan (DIOVAN) 320 MG tablet   Other Relevant Orders   Basic metabolic panel   CBC with Differential/Platelet   Neuropathy ([facility])    Increased pain with  neuropathy.  Did not tolerate gabapentin or lyrica.  Increased pain.  Discussed further evaluation.  Will refer back to Dr Manuella Ghazi for any recommendations for further treatment options.        Relevant Orders   Ambulatory referral to Neurology   Status post vaginal hysterectomy    Doing well s/p surgery.  Follow.        Other Visit Diagnoses    Hyperglycemia    -  Primary   Relevant Orders   Hemoglobin A1c       Einar Pheasant, MD

## 2016-05-12 NOTE — Patient Instructions (Signed)
Alphalipoic acid - one daily

## 2016-05-17 ENCOUNTER — Encounter: Payer: Self-pay | Admitting: Internal Medicine

## 2016-05-17 MED ORDER — CHLORTHALIDONE 25 MG PO TABS: 25.0000 mg | ORAL_TABLET | Freq: Every day | ORAL | 1 refills | Status: DC

## 2016-05-17 MED ORDER — ATORVASTATIN CALCIUM 40 MG PO TABS: 40.0000 mg | ORAL_TABLET | Freq: Every day | ORAL | 1 refills | Status: DC

## 2016-05-17 MED ORDER — AMLODIPINE BESYLATE 5 MG PO TABS: 5.0000 mg | ORAL_TABLET | Freq: Every day | ORAL | 1 refills | Status: AC

## 2016-05-17 MED ORDER — VALSARTAN 320 MG PO TABS: 320.0000 mg | ORAL_TABLET | Freq: Every day | ORAL | 1 refills | Status: DC

## 2016-05-17 MED ORDER — POTASSIUM CHLORIDE ER 10 MEQ PO TBCR: 10.0000 meq | EXTENDED_RELEASE_TABLET | Freq: Every day | ORAL | 1 refills | Status: DC

## 2016-05-17 NOTE — Assessment & Plan Note (Signed)
Increased pain with neuropathy.  Did not tolerate gabapentin or lyrica.  Increased pain.  Discussed further evaluation.  Will refer back to Dr Manuella Ghazi for any recommendations for further treatment options.

## 2016-05-17 NOTE — Assessment & Plan Note (Signed)
Doing well s/p surgery.  Follow.  

## 2016-05-17 NOTE — Assessment & Plan Note (Signed)
s/p surgery.  Followed by Dr Harlow Asa.  Follow calcium level.

## 2016-05-17 NOTE — Assessment & Plan Note (Signed)
On lipitor.  Low cholesterol diet and exercise.  Follow lipid panel and liver function tests.   

## 2016-05-17 NOTE — Assessment & Plan Note (Signed)
No longer seeing psychiatry.  Discussed seeing a counselor.  Will notify me if agreeable.  Desires no medication.

## 2016-05-17 NOTE — Assessment & Plan Note (Signed)
No longer seeing psychiatry.  Discussed seeing a counselor.  Desires no further medication.  Follow.

## 2016-05-17 NOTE — Assessment & Plan Note (Signed)
Blood pressure as outlined.  Increased some on my recheck.  Have her spot check her pressure.  Same medication regimen.  Get her back in soon to reassess.  Follow metabolic panel.

## 2016-05-17 NOTE — Assessment & Plan Note (Signed)
Has seen Dr Arnoldo Morale.  Could be contributing some to the leg pain.  f/u with Dr Manuella Ghazi as outlined.  Keep f/u with Dr Arnoldo Morale.

## 2016-05-26 ENCOUNTER — Other Ambulatory Visit (INDEPENDENT_AMBULATORY_CARE_PROVIDER_SITE_OTHER): Payer: Medicare Other

## 2016-05-26 DIAGNOSIS — R739 Hyperglycemia, unspecified: Secondary | ICD-10-CM | POA: Diagnosis not present

## 2016-05-26 DIAGNOSIS — E7800 Pure hypercholesterolemia, unspecified: Secondary | ICD-10-CM | POA: Diagnosis not present

## 2016-05-26 DIAGNOSIS — I10 Essential (primary) hypertension: Secondary | ICD-10-CM | POA: Diagnosis not present

## 2016-05-26 LAB — BASIC METABOLIC PANEL
BUN: 18 mg/dL (ref 6–23)
CALCIUM: 10.1 mg/dL (ref 8.4–10.5)
CHLORIDE: 101 meq/L (ref 96–112)
CO2: 32 mEq/L (ref 19–32)
CREATININE: 0.67 mg/dL (ref 0.40–1.20)
GFR: 92.71 mL/min (ref >60.00)
Glucose, Bld: 104 mg/dL — ABNORMAL HIGH (ref 70–99)
Potassium: 4.2 mEq/L (ref 3.5–5.1)
Sodium: 140 mEq/L (ref 135–145)

## 2016-05-26 LAB — CBC WITH DIFFERENTIAL/PLATELET
BASOS ABS: 0.1 10*3/uL (ref 0.0–0.1)
BASOS PCT: 0.9 % (ref 0.0–3.0)
EOS ABS: 0.2 10*3/uL (ref 0.0–0.7)
Eosinophils Relative: 2.6 % (ref 0.0–5.0)
HEMATOCRIT: 41.9 % (ref 36.0–46.0)
HEMOGLOBIN: 14.2 g/dL (ref 12.0–15.0)
LYMPHS PCT: 26.3 % (ref 12.0–46.0)
Lymphs Abs: 1.8 10*3/uL (ref 0.7–4.0)
MCHC: 33.9 g/dL (ref 30.0–36.0)
MCV: 98.2 fl (ref 78.0–100.0)
MONO ABS: 0.8 10*3/uL (ref 0.1–1.0)
Monocytes Relative: 12.1 % — ABNORMAL HIGH (ref 3.0–12.0)
NEUTROS ABS: 4 10*3/uL (ref 1.4–7.7)
Neutrophils Relative %: 58.1 % (ref 43.0–77.0)
PLATELETS: 302 10*3/uL (ref 150.0–400.0)
RBC: 4.27 Mil/uL (ref 3.87–5.11)
RDW: 13.1 % (ref 11.5–15.5)
WBC: 6.9 10*3/uL (ref 4.0–10.5)

## 2016-05-26 LAB — LIPID PANEL
CHOLESTEROL: 197 mg/dL (ref 0–200)
HDL: 64.7 mg/dL (ref >39.00)
NonHDL: 132.56
TRIGLYCERIDES: 218 mg/dL — AB (ref 0.0–149.0)
Total CHOL/HDL Ratio: 3
VLDL: 43.6 mg/dL — ABNORMAL HIGH (ref 0.0–40.0)

## 2016-05-26 LAB — HEPATIC FUNCTION PANEL
ALBUMIN: 4.5 g/dL (ref 3.5–5.2)
ALK PHOS: 63 U/L (ref 39–117)
ALT: 21 U/L (ref 0–35)
AST: 21 U/L (ref 0–37)
Bilirubin, Direct: 0.1 mg/dL (ref 0.0–0.3)
Total Bilirubin: 0.7 mg/dL (ref 0.2–1.2)
Total Protein: 7.1 g/dL (ref 6.0–8.3)

## 2016-05-26 LAB — LDL CHOLESTEROL, DIRECT: LDL DIRECT: 86 mg/dL

## 2016-05-26 LAB — HEMOGLOBIN A1C: HEMOGLOBIN A1C: 5.8 % (ref 4.6–6.5)

## 2016-05-29 ENCOUNTER — Telehealth: Payer: Self-pay

## 2016-05-29 NOTE — Telephone Encounter (Signed)
Left message to return call to our office.  

## 2016-05-29 NOTE — Telephone Encounter (Signed)
-----   Message from Einar Pheasant, MD sent at 05/29/2016  3:59 AM EDT ----- She can start otc fish oil and follow low carb diet - to help with triglycerides.

## 2016-05-29 NOTE — Telephone Encounter (Signed)
Noted  

## 2016-06-01 NOTE — Telephone Encounter (Signed)
Patient informed will call if any questions.  

## 2016-06-07 IMAGING — CR RIGHT HIP - COMPLETE 2+ VIEW
1 series · 3 of 3 positions shown · non-contrast
Comparison: February 20, 2011

CLINICAL DATA: Status post fall 6 months ago with persistent pain
of the right hip.

EXAM:
RIGHT HIP - COMPLETE 2+ VIEW

[Series 3: t pelvis ap · 0.14mm/px · 3 of 3 slices shown]
[im 1/3]
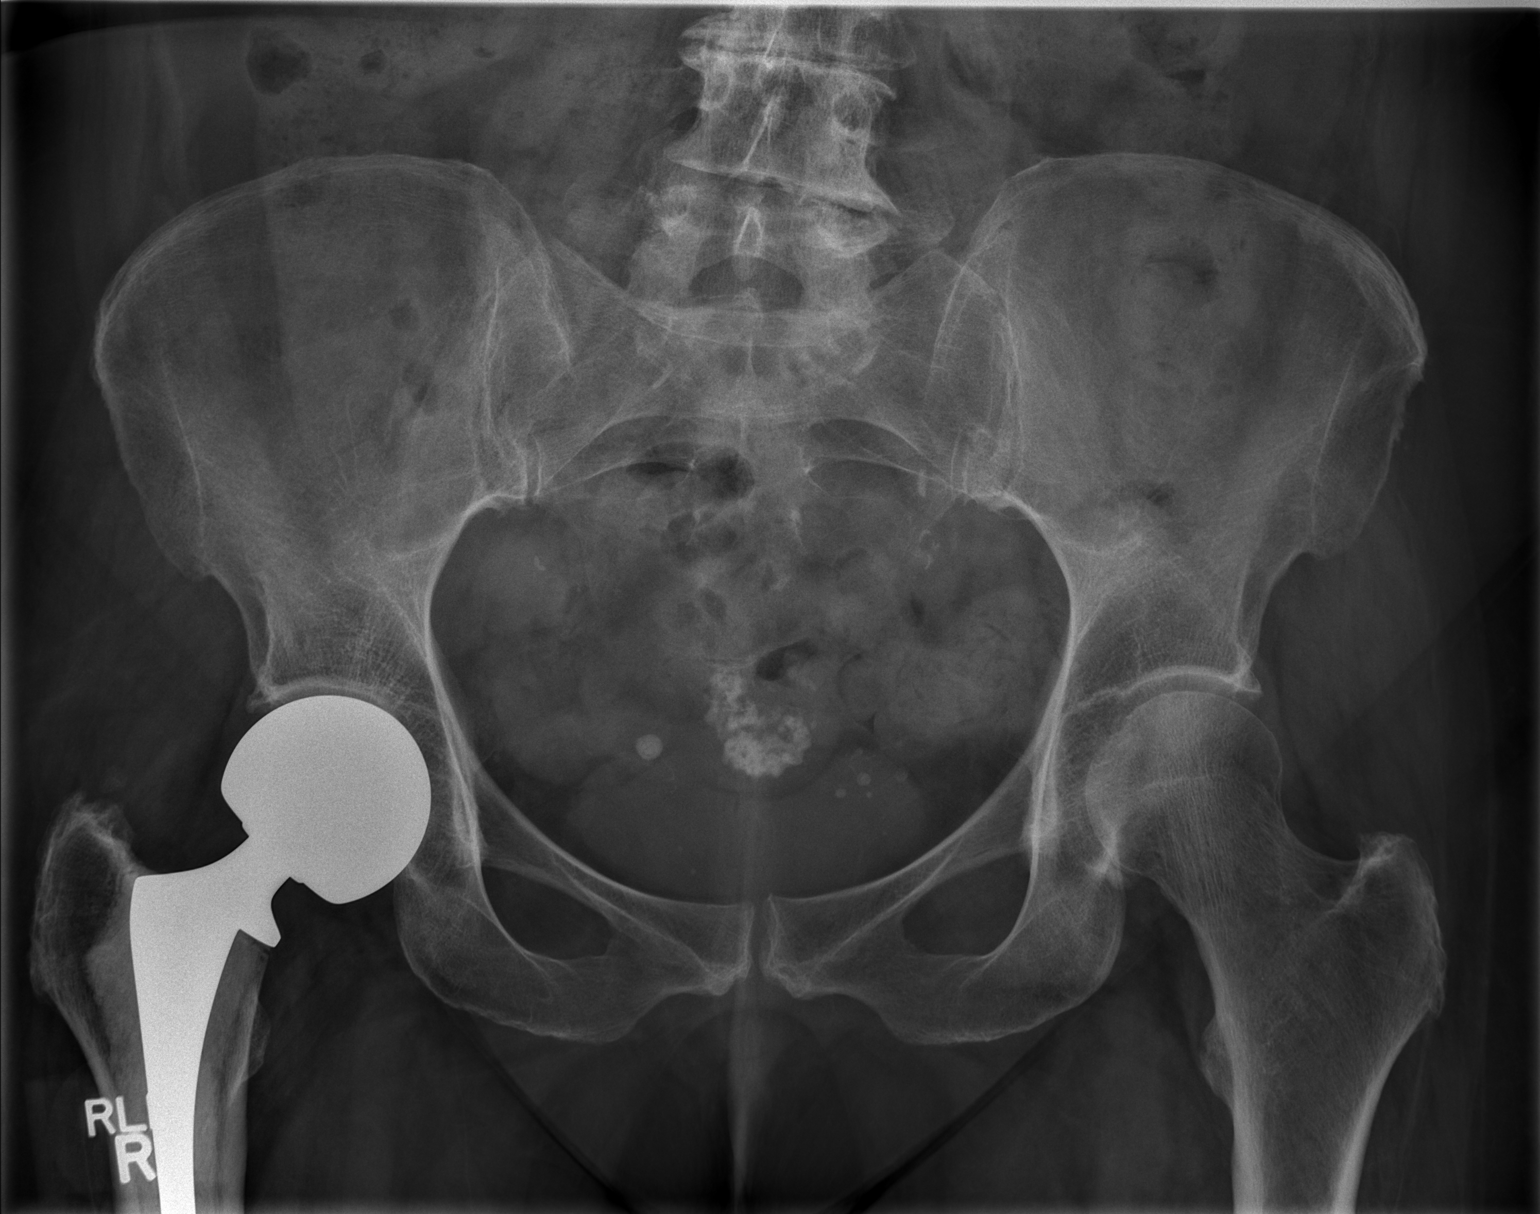
[im 2/3]
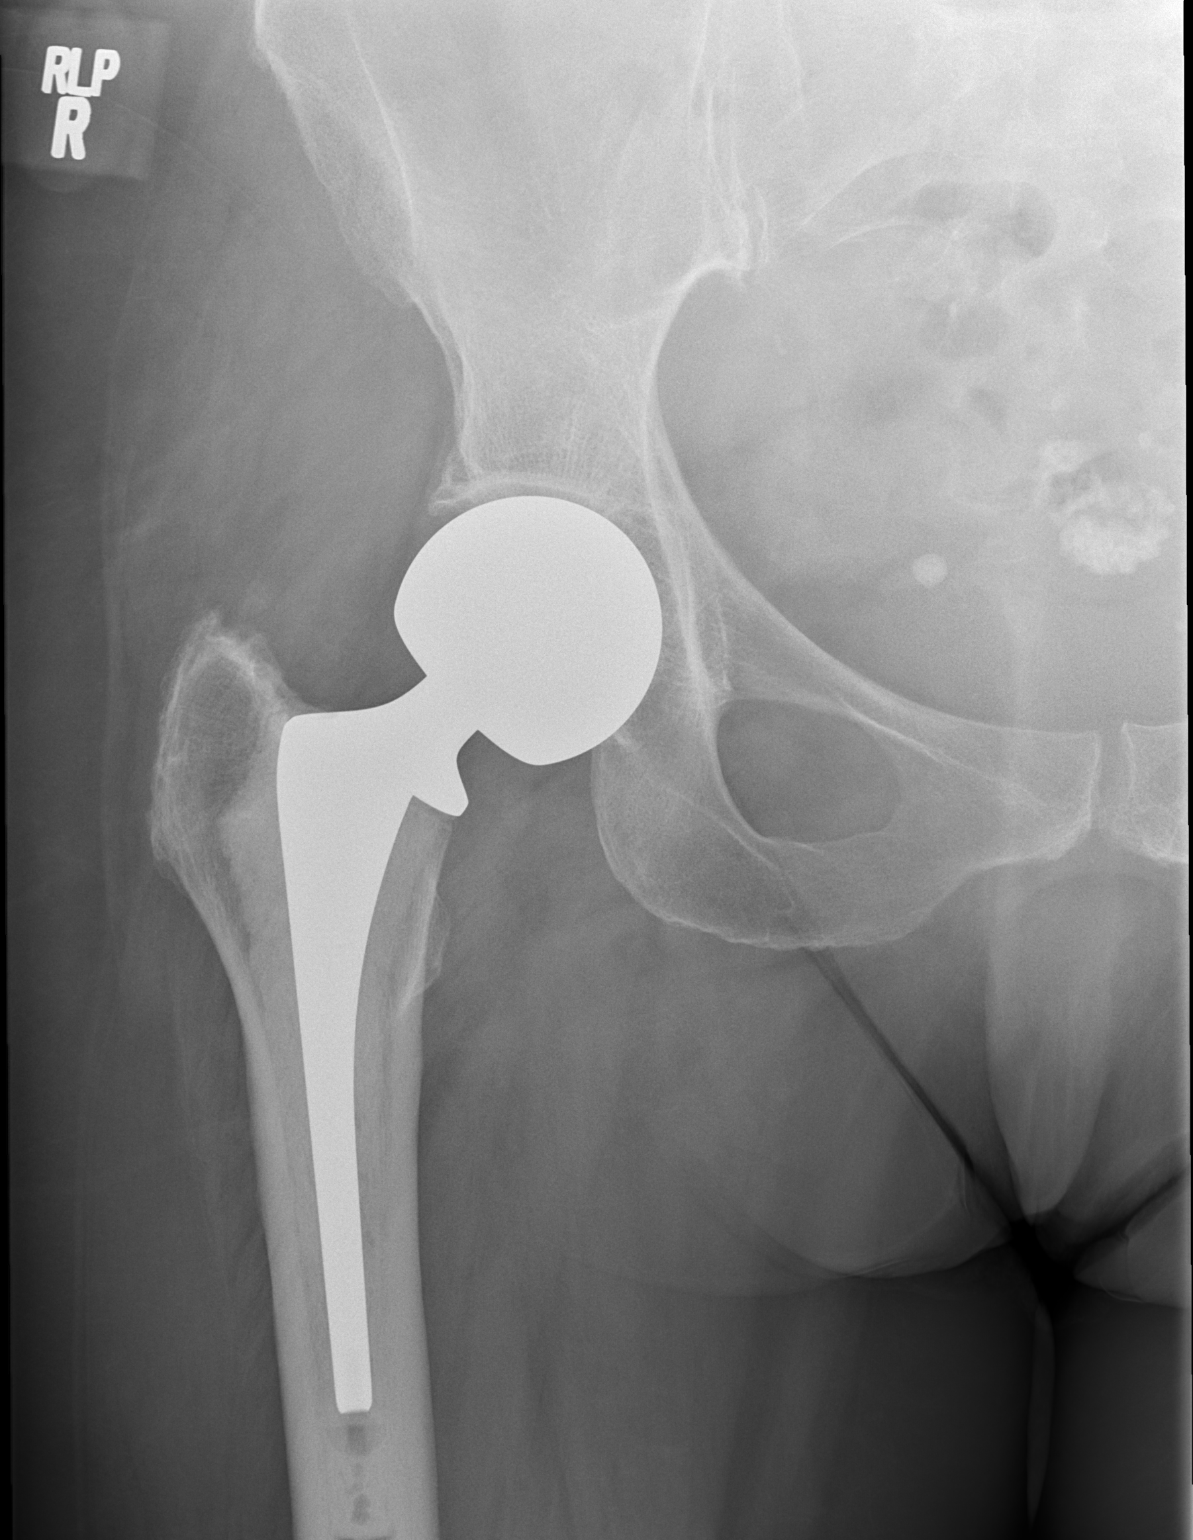
[im 3/3]
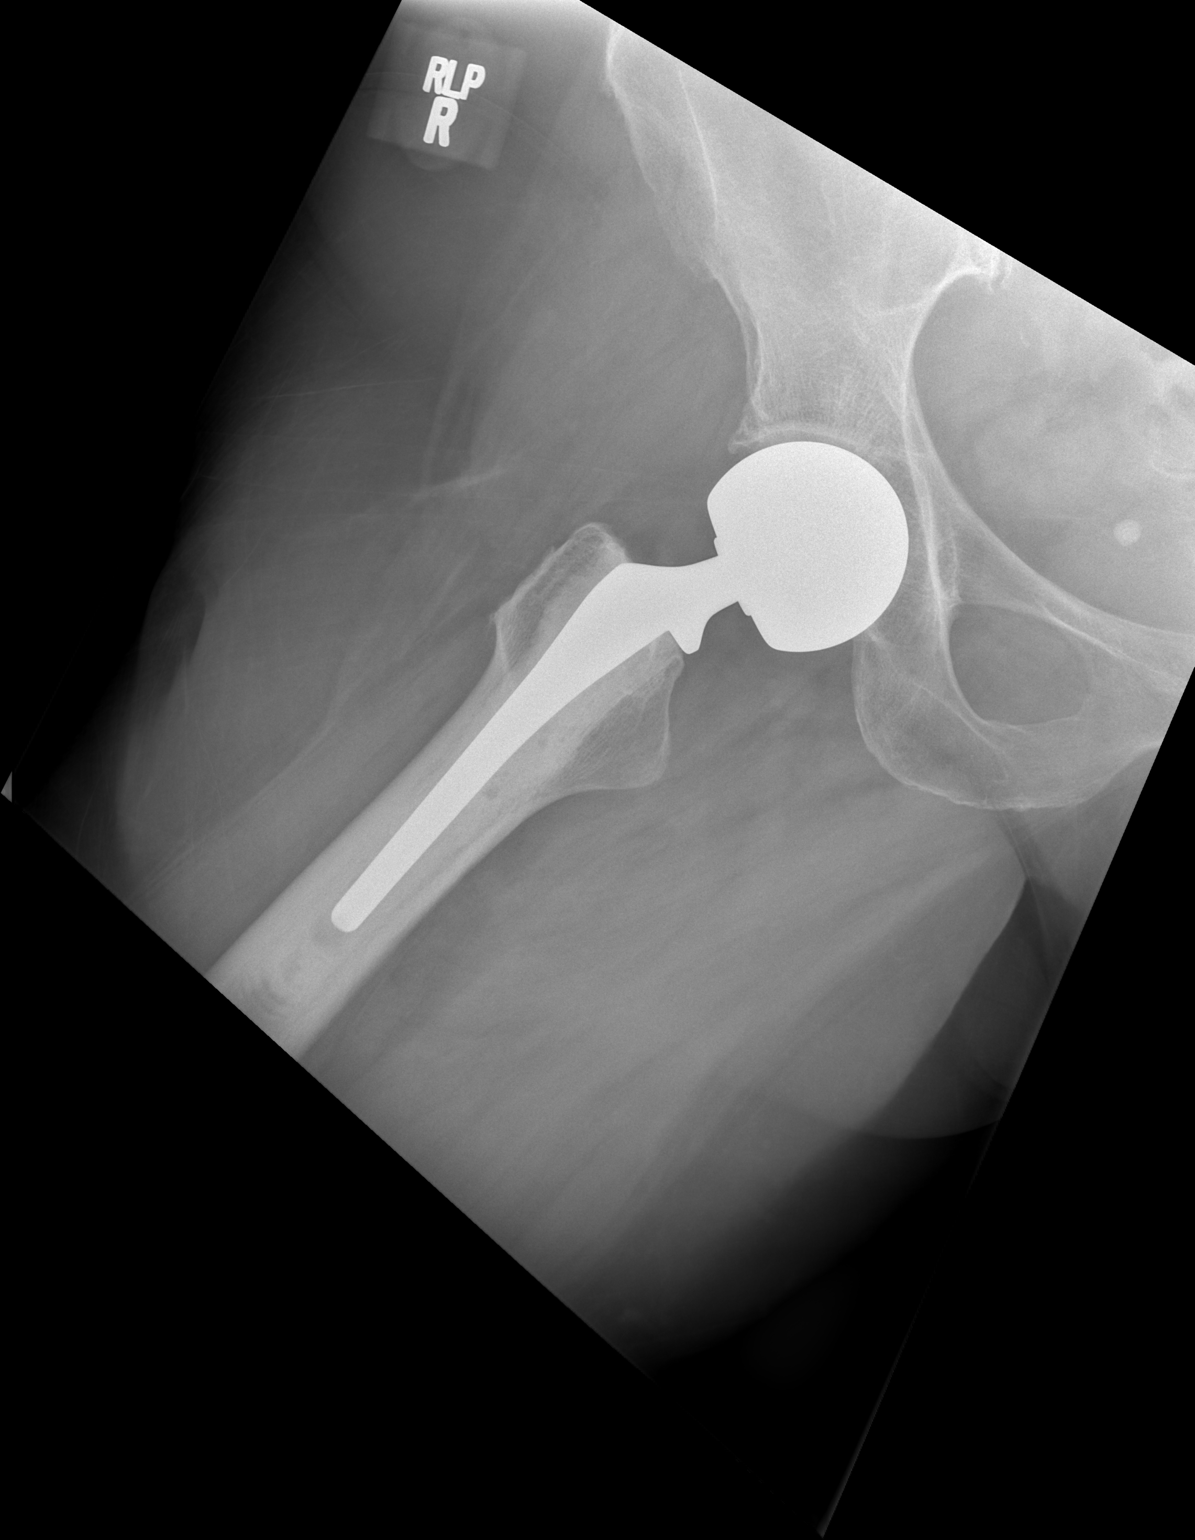

[3 of 3 positions shown; findings below may reference images not displayed]

FINDINGS: There is no evidence of hip fracture or dislocation. Right femoral
hemiarthroplasty is identified unchanged. Degenerative joint changes
of the lumbar spine are noted. Calcified uterine fibroids are noted.
IMPRESSION: No acute abnormality.

## 2016-06-07 IMAGING — CR DG KNEE COMPLETE 4+V*R*
1 series · 4 of 4 positions shown · non-contrast
Comparison: None.

CLINICAL DATA: Persistent back, right hip and knee pain after
falling 6 months ago.

EXAM:
RIGHT KNEE - COMPLETE 4+ VIEW

[Series 3: t knee ap right · 0.14mm/px · 4 of 4 slices shown]
[im 1/4]
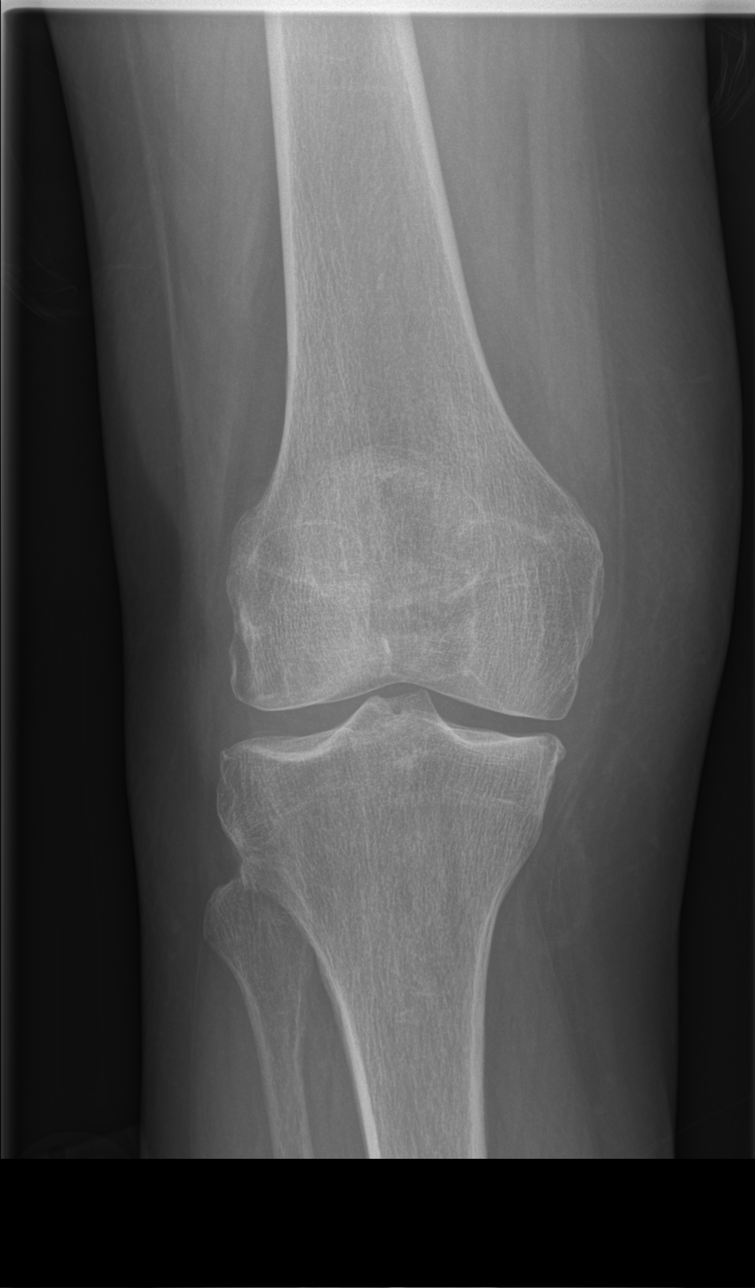
[im 2/4]
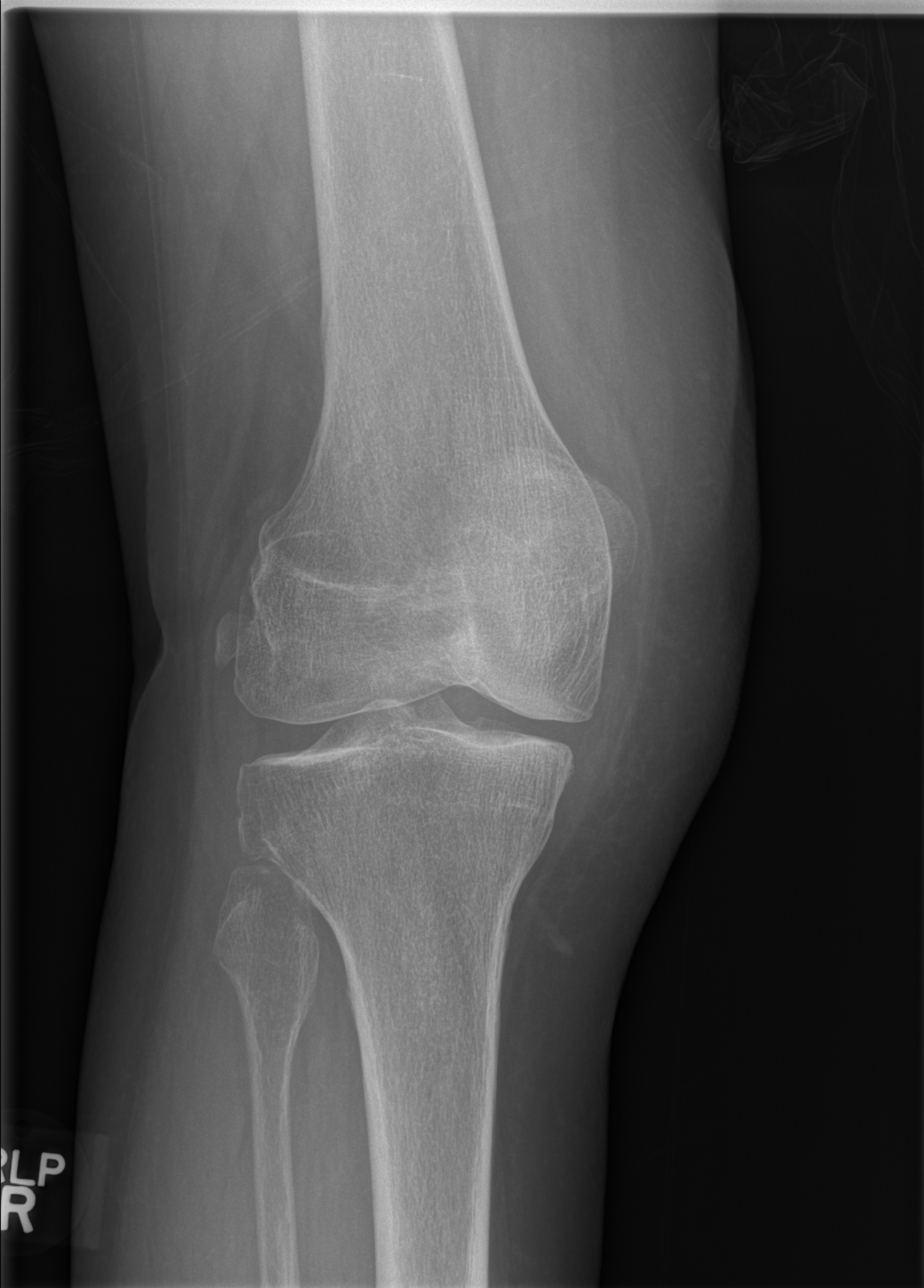
[im 3/4]
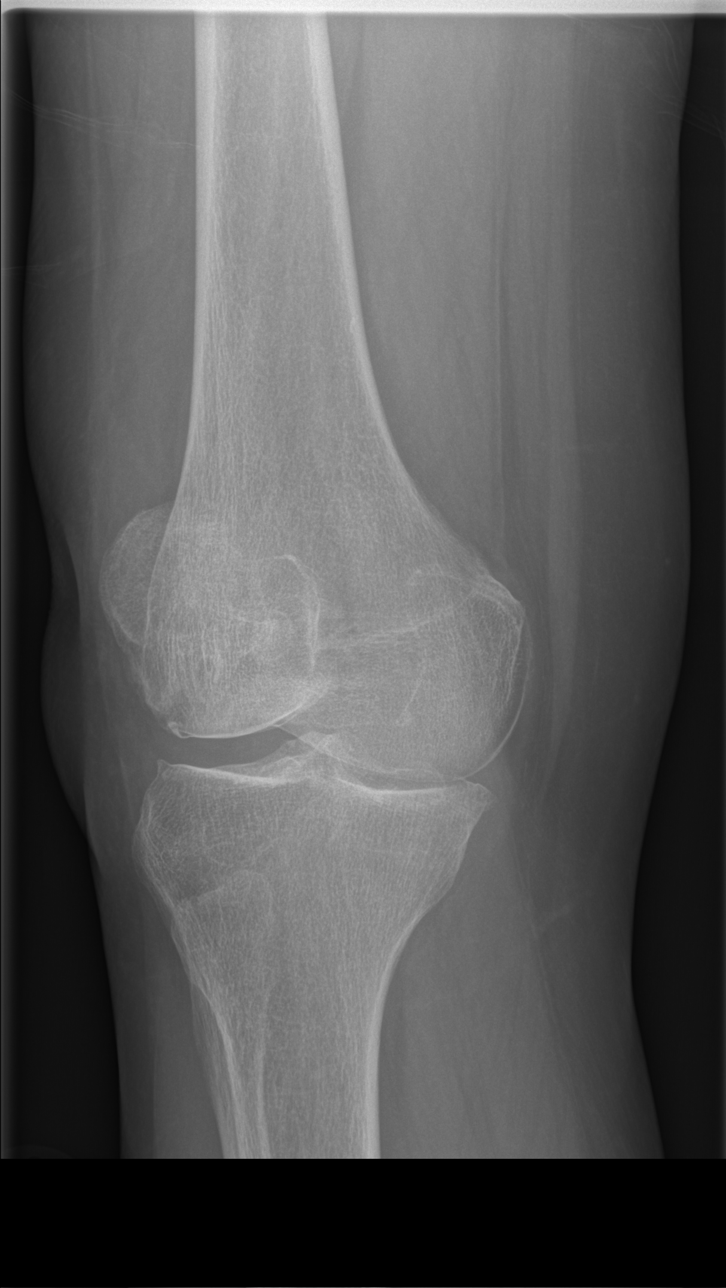
[im 4/4]
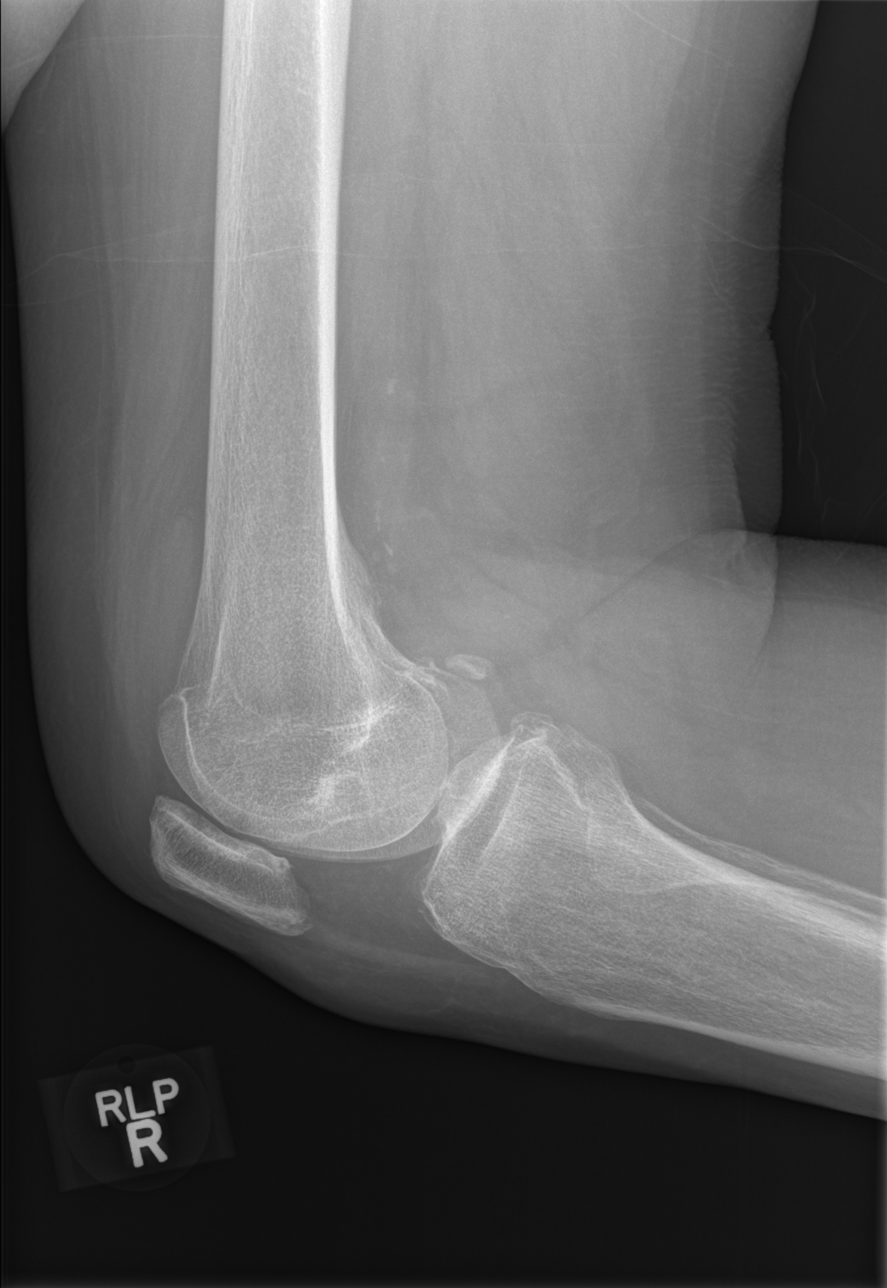

[4 of 4 positions shown; findings below may reference images not displayed]

FINDINGS: The bones appear mildly demineralized. The alignment is normal.
There is no evidence of acute fracture, dislocation or loose body.
This small joint effusion with mild patellofemoral and medial
compartment joint space loss. Scattered vascular calcifications are
noted.
IMPRESSION: No acute osseous findings. Mild degenerative changes and small joint
effusion noted.

## 2016-07-09 DIAGNOSIS — M5412 Radiculopathy, cervical region: Secondary | ICD-10-CM | POA: Diagnosis not present

## 2016-07-09 DIAGNOSIS — M5416 Radiculopathy, lumbar region: Secondary | ICD-10-CM | POA: Diagnosis not present

## 2016-07-09 DIAGNOSIS — G608 Other hereditary and idiopathic neuropathies: Secondary | ICD-10-CM | POA: Diagnosis not present

## 2016-07-10 ENCOUNTER — Encounter: Payer: Self-pay | Admitting: Internal Medicine

## 2016-07-10 ENCOUNTER — Ambulatory Visit (INDEPENDENT_AMBULATORY_CARE_PROVIDER_SITE_OTHER): Payer: Medicare Other | Admitting: Internal Medicine

## 2016-07-10 DIAGNOSIS — G629 Polyneuropathy, unspecified: Secondary | ICD-10-CM | POA: Diagnosis not present

## 2016-07-10 DIAGNOSIS — F419 Anxiety disorder, unspecified: Secondary | ICD-10-CM

## 2016-07-10 DIAGNOSIS — F329 Major depressive disorder, single episode, unspecified: Secondary | ICD-10-CM | POA: Diagnosis not present

## 2016-07-10 DIAGNOSIS — E210 Primary hyperparathyroidism: Secondary | ICD-10-CM | POA: Diagnosis not present

## 2016-07-10 DIAGNOSIS — E7800 Pure hypercholesterolemia, unspecified: Secondary | ICD-10-CM | POA: Diagnosis not present

## 2016-07-10 DIAGNOSIS — I10 Essential (primary) hypertension: Secondary | ICD-10-CM | POA: Diagnosis not present

## 2016-07-10 DIAGNOSIS — F32A Depression, unspecified: Secondary | ICD-10-CM

## 2016-07-10 DIAGNOSIS — M5136 Other intervertebral disc degeneration, lumbar region: Secondary | ICD-10-CM | POA: Diagnosis not present

## 2016-07-10 DIAGNOSIS — D649 Anemia, unspecified: Secondary | ICD-10-CM | POA: Diagnosis not present

## 2016-07-10 MED ORDER — MUPIROCIN 2 % EX OINT: TOPICAL_OINTMENT | CUTANEOUS | 0 refills | Status: DC

## 2016-07-10 NOTE — Progress Notes (Signed)
Patient ID: [name], female   DOB: [birth year], 71 y.o.   MRN: [id number]   Subjective:    Patient ID: [name], female    DOB: 16-Feb-1945, 71 y.o.   MRN: [id number]  HPI  Patient here for a scheduled follow up.  She reports she is doing relatively well.  We discussed her increased stress.  Feels this is contributing to some of her symptoms.  She is planning to contact Karen San [place]ino her counselor for f/u.  No chest pain.  Breathing stable.  No abdominal pain.  Bowels moving.  She just saw Dr Manuella Ghazi yesterday.  Note reviewed.  Had peripheral neuropathy.  Prefers to not try prescription medication.  He did recommend her to start alpha lipoic acid.  She reports some arm numbness R>L.  She reports she discussed this with Dr Manuella Ghazi.  Has had issued with back pain.  Evaluated by Dr Arnoldo Morale.  Overall stable.     Past Medical History:  Diagnosis Date  . Anemia   . Anxiety   . Arthritis   . Burning feet syndrome   . Chronic insomnia   . Complication of anesthesia    "AFFECTS MY MEMORY"  . COPD (chronic obstructive pulmonary disease) ([place])    documented in outside records  . Depression   . Dysrhythmia    PALPITATIONS   . Edema    MILD ANKLE  . GERD (gastroesophageal reflux disease)   . Heart murmur   . History of stomach ulcers   . HOH (hard of hearing)    AIDS  . Hypercholesterolemia   . Hyperparathyroidism, primary ([place])   . Hypertension   . Neuropathy   . Osteoporosis   . Palpitations   . Scoliosis   . Shortness of breath    with exertion  . Tingling    both hands   Past Surgical History:  Procedure Laterality Date  . ANTERIOR CERVICAL DECOMP/DISCECTOMY FUSION  10/28/2011   Procedure: ANTERIOR CERVICAL DECOMPRESSION/DISCECTOMY FUSION 2 LEVELS;  Surgeon: Elaina Hoops, MD;  Location: [facility];  Service: Neurosurgery;  Laterality: N/A;  . BREAST BIOPSY Right 1992   neg  . BREAST SURGERY     RT BREAST CYST REMOVED 15 YRS AGO   . CATARACT EXTRACTION W/PHACO Right  10/30/2014   Procedure: CATARACT EXTRACTION PHACO AND INTRAOCULAR LENS PLACEMENT (IOC);  Surgeon: Birder Robson, MD;  Location: [facility];  Service: Ophthalmology;  Laterality: Right;  US0:40 AP18.2 CDE7.42  casette lot #[id number]  . CATARACT EXTRACTION W/PHACO Left 11/27/2014   Procedure: CATARACT EXTRACTION PHACO AND INTRAOCULAR LENS PLACEMENT (IOC);  Surgeon: Birder Robson, MD;  Location: [facility];  Service: Ophthalmology;  Laterality: Left;  [place] 00:36.9 AP%: 18.9 CDE: 6.96  casette lot #  [id number]  . CYSTOCELE REPAIR N/A 05/13/2015   Procedure: ANTERIOR REPAIR (CYSTOCELE);  Surgeon: Brayton Mars, MD;  Location: [facility];  Service: Gynecology;  Laterality: N/A;  . CYSTOSCOPY  05/13/2015   Procedure: CYSTOSCOPY;  Surgeon: Brayton Mars, MD;  Location: [facility];  Service: Gynecology;;  . DILATION AND CURETTAGE OF UTERUS     X2   . JOINT REPLACEMENT     RT HIP  2013  (PARTIAL)   . PARATHYROIDECTOMY Left 08/25/2012   Procedure: PARATHYROIDECTOMY;  Surgeon: Earnstine Regal, MD;  Location: [facility];  Service: General;  Laterality: Left;  . PITUITARY SURGERY     FOR TUMOR 3 YRS   . TOTAL KNEE ARTHROPLASTY Right 07/18/2014   Procedure: TOTAL  KNEE ARTHROPLASTY;  Surgeon: Dereck Leep, MD;  Location: [facility];  Service: Orthopedics;  Laterality: Right;  Marland Kitchen VAGINAL HYSTERECTOMY Bilateral 05/13/2015   Procedure: HYSTERECTOMY VAGINAL WITH BILATERAL SALPINGO OOPHERECTOMY;  Surgeon: Brayton Mars, MD;  Location: [facility];  Service: Gynecology;  Laterality: Bilateral;  . VAGINAL HYSTERECTOMY     bso ant repair   Family History  Problem Relation Age of Onset  . Glaucoma Mother   . Hypertension Mother   . Thyroid disease Mother   . Stroke Father   . Hypertension Father   . Cancer Neg Hx   . Diabetes Neg Hx    Social History   Social History  . Marital status: Divorced    Spouse name: N/A  . Number of children: N/A  . Years of education: N/A   Social History Main Topics    . Smoking status: Former Smoker    Quit date: 02/10/2008  . Smokeless tobacco: Never Used  . Alcohol use 0.0 oz/week     Comment: rare  . Drug use: No  . Sexual activity: Not Currently    Birth control/ protection: Post-menopausal   Other Topics Concern  . None   Social History Narrative  . None    Outpatient Encounter Prescriptions as of 07/10/2016  Medication Sig  . acetaminophen (TYLENOL) 500 MG tablet Take 1,000 mg by mouth every 6 (six) hours as needed for fever.   . Alpha-Lipoic Acid 600 MG CAPS Take by mouth.  Marland Kitchen amLODipine (NORVASC) 5 MG tablet Take 1 tablet (5 mg total) by mouth daily.  Marland Kitchen aspirin EC 81 MG tablet Take 81 mg by mouth daily.  Marland Kitchen atorvastatin (LIPITOR) 40 MG tablet Take 1 tablet (40 mg total) by mouth daily.  . Biotin (BIOTIN MAXIMUM STRENGTH) 10 MG TABS Take 1 tablet by mouth daily.  . chlorthalidone (HYGROTON) 25 MG tablet Take 1 tablet (25 mg total) by mouth daily.  . cholecalciferol (VITAMIN D) 1000 UNITS tablet Take 1,000 Units by mouth daily.   Marland Kitchen docusate sodium (COLACE) 100 MG capsule Take 100 mg by mouth 2 (two) times daily as needed for mild constipation.   . ferrous sulfate 325 (65 FE) MG tablet Take 325 mg by mouth daily.  . Glycerin-Polysorbate 80 (REFRESH DRY EYE THERAPY OP) Apply to eye.  . magnesium oxide (MAG-OX) 400 MG tablet Take 400 mg by mouth daily.  . Multiple Vitamins-Minerals (MULTIVITAMIN PO) Take 1 tablet by mouth daily.  . pantoprazole (PROTONIX) 40 MG tablet TAKE ONE TABLET TWICE DAILY  . Polyethyl Glycol-Propyl Glycol (SYSTANE OP) Apply 1 drop to eye once.  . potassium chloride (K-DUR) 10 MEQ tablet Take 1 tablet (10 mEq total) by mouth daily.  . Probiotic Product (FLORAJEN3 PO) Take 1 tablet by mouth.  . ranitidine (ZANTAC) 150 MG tablet Take 150 mg by mouth 2 (two) times daily.  . valsartan (DIOVAN) 320 MG tablet Take 1 tablet (320 mg total) by mouth daily.  . mupirocin ointment (BACTROBAN) 2 % Apply to affected area on foot bid    No facility-administered encounter medications on file as of 07/10/2016.     Review of Systems  Constitutional: Negative for appetite change and unexpected weight change.  HENT: Negative for congestion and sinus pressure.   Respiratory: Negative for cough, chest tightness and shortness of breath.   Cardiovascular: Negative for chest pain, palpitations and leg swelling.  Gastrointestinal: Negative for abdominal pain, diarrhea, nausea and vomiting.  Genitourinary: Negative for difficulty urinating and dysuria.  Musculoskeletal:  Positive for back pain.       Extremity numbness as outlined.  Leg pain   Skin: Negative for color change and rash.  Neurological: Negative for dizziness, light-headedness and headaches.  Psychiatric/Behavioral: Negative for agitation and dysphoric mood.       Increased stress.         Objective:    Physical Exam  Constitutional: She appears well-developed and well-nourished. No distress.  HENT:  Nose: Nose normal.  Mouth/Throat: Oropharynx is clear and moist.  Neck: Neck supple. No thyromegaly present.  Cardiovascular: Normal rate and regular rhythm.   Pulmonary/Chest: Breath sounds normal. No respiratory distress. She has no wheezes.  Abdominal: Soft. Bowel sounds are normal. There is no tenderness.  Musculoskeletal: She exhibits no edema or tenderness.  Lymphadenopathy:    She has no cervical adenopathy.  Skin: No rash noted. No erythema.  Psychiatric: She has a normal mood and affect. Her behavior is normal.    BP 126/68 (BP Location: Left Arm, Patient Position: Sitting, Cuff Size: Normal)   Pulse (!) 56   Temp 98.5 F (36.9 C) (Oral)   Resp 12   Ht 5\' 5"  (1.651 m)   Wt 143 lb (64.9 kg)   SpO2 98%   BMI 23.80 kg/m  Wt Readings from Last 3 Encounters:  07/10/16 143 lb (64.9 kg)  05/12/16 150 lb 3.2 oz (68.1 kg)  01/29/16 150 lb 1.9 oz (68.1 kg)     Lab Results  Component Value Date   WBC 6.9 05/26/2016   HGB 14.2 05/26/2016   HCT  41.9 05/26/2016   PLT 302.0 05/26/2016   GLUCOSE 104 (H) 05/26/2016   CHOL 197 05/26/2016   TRIG 218.0 (H) 05/26/2016   HDL 64.70 05/26/2016   LDLDIRECT 86.0 05/26/2016   LDLCALC 100 (H) 12/24/2015   ALT 21 05/26/2016   AST 21 05/26/2016   NA 140 05/26/2016   K 4.2 05/26/2016   CL 101 05/26/2016   CREATININE 0.67 05/26/2016   BUN 18 05/26/2016   CO2 32 05/26/2016   TSH 1.11 08/08/2015   INR 0.91 07/05/2014   HGBA1C 5.8 05/26/2016    Mr Brain W Wo Contrast  Result Date: 02/11/2016 CLINICAL DATA:  Follow-up exam for pituitary microadenoma. Patient with new onset difficulty with balance, trouble walking, ringing in ears. EXAM: MRI HEAD WITHOUT AND WITH CONTRAST TECHNIQUE: Multiplanar, multiecho pulse sequences of the brain and surrounding structures were obtained without and with intravenous contrast. Dedicated pituitary protocol was employed. CONTRAST:  55mL MULTIHANCE GADOBENATE DIMEGLUMINE 529 MG/ML IV SOLN COMPARISON:  Previous MRI from 04/20/2011. FINDINGS: Brain: Mild diffuse prominence of the CSF containing spaces is compatible with generalized cerebral atrophy, normal for age. Mild patchy T2/FLAIR hyperintensity within the periventricular and deep white matter both cerebral hemispheres present, most likely related to chronic small vessel ischemic disease, mild for age. Overall, changes are similar to previous. No abnormal foci of restricted diffusion to suggest acute or subacute ischemia. Gray-white matter differentiation is maintained. No evidence for acute or chronic intracranial hemorrhage. No encephalomalacia to suggest chronic infarction. Again seen is a small well-circumscribed T2 hypointense nonenhancing lesion centered at the third ventricle, measuring 9 x 5 x 6 mm on today's study (series 6, image 14). Finding may reflect a small colloid cysts, and is similar to previous. Ventricles within normal limits without hydrocephalus. No midline shift or mass effect. No other mass lesion  or abnormal enhancement elsewhere within the brain. Major dural sinuses are patent. No extra-axial fluid collection.  Dynamic imaging through the pituitary gland and sella was performed. Again seen is a relatively hypoenhancing pituitary microadenoma centered at the right aspect of the sella, measuring 11 x 8 x 12 mm on today's study (transverse by craniocaudad by AP). Overall, lesion is not significantly changed in size as compared to prior study. Evidence of cavernous sinus invasion again seen on the right. Pituitary stalk is displaced to the left. Suprasellar expansion with extension into the suprasellar cistern seen without significant mass effect on the optic chiasm. Overall, changes relatively similar to previous. Vascular: Normal intracranial vascular flow voids are maintained. Skull and upper cervical spine: Craniocervical junction within normal limits. Sequela prior ACDF noted within the partially visualized upper cervical spine. Bone marrow signal intensity within normal limits. No scalp soft tissue abnormality. Sinuses/Orbits: Globes and orbital soft tissues within normal limits. Patient is status post cataract extraction bilaterally. Scattered mucosal thickening throughout the paranasal sinuses, likely allergic/ inflammatory nature. No air-fluid level to suggest active sinus infection. No mastoid effusion. Inner ear structures within normal limits. IMPRESSION: 1. No significant interval change in size and appearance of right-sided pituitary microadenoma as detailed above. 2. No other acute intracranial process identified. 3. Mild chronic microvascular ischemic disease, stable. Electronically Signed   By: Jeannine Boga M.D.   On: 02/11/2016 14:50   Mr Lumbar Spine Wo Contrast  Result Date: 02/11/2016 CLINICAL DATA:  Multiple falls. Low back pain with bilateral leg and foot pain. EXAM: MRI LUMBAR SPINE WITHOUT CONTRAST TECHNIQUE: Multiplanar, multisequence MR imaging of the lumbar spine was  performed. No intravenous contrast was administered. COMPARISON:  Radiography 12/31/2015.  MRI 05/02/2014. FINDINGS: Segmentation:  5 lumbar type vertebral bodies Alignment:  Curvature convex to the left with the apex at L3. Vertebrae: No fracture or primary bone lesion. Chronic discogenic endplate marrow changes. Conus medullaris: Extends to the L1-2 level and appears normal. Paraspinal and other soft tissues: No significant finding. Disc levels: T11-12 and T12-L1:  Unremarkable. L1-2:  Mild bulging of the disc.  No canal or foraminal stenosis. L2-3: Disc degeneration with endplate osteophytes and bulging of the disc more prominent towards the right. Mild facet arthropathy. Mild narrowing of the right lateral recess and intervertebral foramen on the right without definite neural compression. L3-4: Chronic disc degeneration with loss of disc height. Endplate osteophytes and mild bulging of the disc. Mild facet hypertrophy. Mild narrowing of the lateral recesses and foramina, more on the left, without visible neural compression. L4-5: Disc degeneration with shallow protrusion and slight caudal migration. Degenerative desiccation of the migrated disc material. No significant encroachment upon the spinal canal. Mild facet hypertrophy on the left. Mild narrowing of the left lateral recess and intervertebral foramen on the left without visible neural compression. L5-S1: Mild bulging of the disc.  No stenosis. IMPRESSION: No progressive disease since 2016. Curvature convex to the left with the apex at L2. Right lateral recess and foraminal narrowing at L2-3 because of osteophytes and bulging disc material. No distinct neural compression. Mild lateral recess and foraminal narrowing at L3-4 without visible neural compression. Shallow disc protrusion at L4-5 with caudal migration. Mild facet hypertrophy on the left. Mild narrowing of the left lateral recess and intervertebral foramen on the left but no visible neural  compression. Caudally migrated disc material has desiccated and involuted slightly. Electronically Signed   By: Nelson Chimes M.D.   On: 02/11/2016 13:24       Assessment & Plan:   Problem List Items Addressed This Visit  Anemia    Last cbc wnl.  Follow.        Anxiety    No longer seeing psychiatry.  Discussed increased stress with her today.  She plans to contact Karen San [place]ino.        DDD (degenerative disc disease), lumbar    Has seen Dr Arnoldo Morale.  Had NCS as outlined.  Desires no further medication.  Follow.        Depression    Planning to follow up with her counselor.  Stable.        Hypercholesterolemia    On lipitor.  Low cholesterol diet and exercise.  Follow lipid panel and liver function tests.        Relevant Orders   Lipid panel   Hepatic function panel   Hyperparathyroidism, primary ([place])    S/p surgery.  Followed by Dr Harlow Asa.  Follow calcium.        Hypertension    Blood pressure under good control.  Continue same medication regimen.  Follow pressures.  Follow metabolic panel.        Relevant Orders   TSH   Basic metabolic panel   Neuropathy    Saw Dr Manuella Ghazi.  Note reviewed.  She preferred no further medications.  Was agreeable to try alpha lipoic acid.  Follow.            Einar Pheasant, MD

## 2016-07-12 ENCOUNTER — Encounter: Payer: Self-pay | Admitting: Internal Medicine

## 2016-07-12 NOTE — Assessment & Plan Note (Signed)
Saw Dr Manuella Ghazi.  Note reviewed.  She preferred no further medications.  Was agreeable to try alpha lipoic acid.  Follow.

## 2016-07-12 NOTE — Assessment & Plan Note (Signed)
Blood pressure under good control.  Continue same medication regimen.  Follow pressures.  Follow metabolic panel.   

## 2016-07-12 NOTE — Assessment & Plan Note (Signed)
On lipitor.  Low cholesterol diet and exercise.  Follow lipid panel and liver function tests.   

## 2016-07-12 NOTE — Assessment & Plan Note (Signed)
Has seen Dr Arnoldo Morale.  Had NCS as outlined.  Desires no further medication.  Follow.

## 2016-07-12 NOTE — Assessment & Plan Note (Signed)
Last cbc wnl.  Follow.

## 2016-07-12 NOTE — Assessment & Plan Note (Signed)
S/p surgery.  Followed by Dr Harlow Asa.  Follow calcium.

## 2016-07-12 NOTE — Assessment & Plan Note (Signed)
No longer seeing psychiatry.  Discussed increased stress with her today.  She plans to contact Karen [place].

## 2016-07-12 NOTE — Assessment & Plan Note (Signed)
Planning to follow up with her counselor.  Stable.

## 2016-08-06 ENCOUNTER — Telehealth: Payer: Self-pay | Admitting: Internal Medicine

## 2016-08-06 NOTE — Telephone Encounter (Signed)
Please advise 

## 2016-08-06 NOTE — Telephone Encounter (Signed)
Pt called and stated that her scalp is very itchy and was wondering if it is a reaction to apple cider vinegar or Alpha-Lipoic Acid 600 MG CAPS. Please advise, thank you!  Call pt @ [phone]

## 2016-08-06 NOTE — Telephone Encounter (Signed)
If it is just her scalp, I would not think it is related to the alpha lipoic acid.  She can stop the alpha lipoic acid and see if symptoms improve.  Is she using apple cider vinegar on her hair.  If no, again I don't think related, but would stop and see if symptoms improve.  If any rash or concern about infection, needs to be seen.  Can try selsun shampoo.

## 2016-08-06 NOTE — Telephone Encounter (Signed)
Patient has been informed. She will go to ED/UC if needed, but will call here first if sx become worse.

## 2016-08-08 ENCOUNTER — Other Ambulatory Visit: Payer: Self-pay | Admitting: Internal Medicine

## 2016-08-27 ENCOUNTER — Other Ambulatory Visit: Payer: Self-pay | Admitting: Internal Medicine

## 2016-08-27 DIAGNOSIS — Z1231 Encounter for screening mammogram for malignant neoplasm of breast: Secondary | ICD-10-CM

## 2016-08-28 DIAGNOSIS — R802 Orthostatic proteinuria, unspecified: Secondary | ICD-10-CM | POA: Diagnosis not present

## 2016-09-11 ENCOUNTER — Other Ambulatory Visit: Payer: Self-pay

## 2016-09-11 ENCOUNTER — Telehealth: Payer: Self-pay

## 2016-09-11 MED ORDER — LOSARTAN POTASSIUM 100 MG PO TABS: 100.0000 mg | ORAL_TABLET | Freq: Every day | ORAL | 0 refills | Status: DC

## 2016-09-11 NOTE — Telephone Encounter (Signed)
Fax from pharmacy received recall has created shortage of Valsartan would like to know if ok to change to Losartan. I have put fax in your blue folder to review.

## 2016-09-11 NOTE — Telephone Encounter (Signed)
Called patient she is ok with change. I have sent new script over to her pharmacy.

## 2016-09-11 NOTE — Telephone Encounter (Signed)
Notify pt that we will need to change her valsartan to losartan 100mg  q day.  If agreeable, will need new rx sent in.

## 2016-09-18 DIAGNOSIS — M3501 Sicca syndrome with keratoconjunctivitis: Secondary | ICD-10-CM | POA: Diagnosis not present

## 2016-09-24 ENCOUNTER — Ambulatory Visit
Admission: RE | Admit: 2016-09-24 | Discharge: 2016-09-24 | Disposition: A | Discharge status: Home / Self Care | Payer: Medicare Other | Source: Ambulatory Visit | Attending: Internal Medicine | Admitting: Internal Medicine

## 2016-09-24 DIAGNOSIS — Z1231 Encounter for screening mammogram for malignant neoplasm of breast: Secondary | ICD-10-CM

## 2016-09-30 DIAGNOSIS — D2272 Melanocytic nevi of left lower limb, including hip: Secondary | ICD-10-CM | POA: Diagnosis not present

## 2016-09-30 DIAGNOSIS — D2262 Melanocytic nevi of left upper limb, including shoulder: Secondary | ICD-10-CM | POA: Diagnosis not present

## 2016-09-30 DIAGNOSIS — R202 Paresthesia of skin: Secondary | ICD-10-CM | POA: Diagnosis not present

## 2016-09-30 DIAGNOSIS — D225 Melanocytic nevi of trunk: Secondary | ICD-10-CM | POA: Diagnosis not present

## 2016-10-01 DIAGNOSIS — M79674 Pain in right toe(s): Secondary | ICD-10-CM | POA: Diagnosis not present

## 2016-10-01 DIAGNOSIS — M79672 Pain in left foot: Secondary | ICD-10-CM | POA: Diagnosis not present

## 2016-10-01 DIAGNOSIS — S93325S Dislocation of tarsometatarsal joint of left foot, sequela: Secondary | ICD-10-CM | POA: Diagnosis not present

## 2016-10-01 DIAGNOSIS — B351 Tinea unguium: Secondary | ICD-10-CM | POA: Diagnosis not present

## 2016-10-15 ENCOUNTER — Other Ambulatory Visit (INDEPENDENT_AMBULATORY_CARE_PROVIDER_SITE_OTHER): Payer: Medicare Other

## 2016-10-15 DIAGNOSIS — E7800 Pure hypercholesterolemia, unspecified: Secondary | ICD-10-CM | POA: Diagnosis not present

## 2016-10-15 DIAGNOSIS — I10 Essential (primary) hypertension: Secondary | ICD-10-CM | POA: Diagnosis not present

## 2016-10-15 LAB — BASIC METABOLIC PANEL
BUN: 15 mg/dL (ref 6–23)
CALCIUM: 10 mg/dL (ref 8.4–10.5)
CHLORIDE: 101 meq/L (ref 96–112)
CO2: 34 meq/L — AB (ref 19–32)
Creatinine, Ser: 0.69 mg/dL (ref 0.40–1.20)
GFR: 89.51 mL/min (ref >60.00)
Glucose, Bld: 97 mg/dL (ref 70–99)
Potassium: 3.7 mEq/L (ref 3.5–5.1)
SODIUM: 142 meq/L (ref 135–145)

## 2016-10-15 LAB — HEPATIC FUNCTION PANEL
ALBUMIN: 4.4 g/dL (ref 3.5–5.2)
ALT: 23 U/L (ref 0–35)
AST: 18 U/L (ref 0–37)
Alkaline Phosphatase: 53 U/L (ref 39–117)
Bilirubin, Direct: 0.1 mg/dL (ref 0.0–0.3)
TOTAL PROTEIN: 6.8 g/dL (ref 6.0–8.3)
Total Bilirubin: 0.5 mg/dL (ref 0.2–1.2)

## 2016-10-15 LAB — LIPID PANEL
CHOL/HDL RATIO: 3
Cholesterol: 184 mg/dL (ref 0–200)
HDL: 67.2 mg/dL (ref >39.00)
LDL CALC: 97 mg/dL (ref 0–99)
NONHDL: 117.07
Triglycerides: 99 mg/dL (ref 0.0–149.0)
VLDL: 19.8 mg/dL (ref 0.0–40.0)

## 2016-10-15 LAB — TSH: TSH: 1.55 u[IU]/mL (ref 0.35–4.50)

## 2016-10-16 ENCOUNTER — Encounter: Payer: Self-pay | Admitting: Internal Medicine

## 2016-10-16 ENCOUNTER — Ambulatory Visit (INDEPENDENT_AMBULATORY_CARE_PROVIDER_SITE_OTHER): Payer: Medicare Other | Admitting: Internal Medicine

## 2016-10-16 VITALS — BP 132/64 | HR 60 | Temp 98.6°F | Resp 14 | Ht 66.54 in | Wt 136.8 lb

## 2016-10-16 DIAGNOSIS — I10 Essential (primary) hypertension: Secondary | ICD-10-CM | POA: Diagnosis not present

## 2016-10-16 DIAGNOSIS — E210 Primary hyperparathyroidism: Secondary | ICD-10-CM

## 2016-10-16 DIAGNOSIS — F419 Anxiety disorder, unspecified: Secondary | ICD-10-CM | POA: Diagnosis not present

## 2016-10-16 DIAGNOSIS — F329 Major depressive disorder, single episode, unspecified: Secondary | ICD-10-CM

## 2016-10-16 DIAGNOSIS — R200 Anesthesia of skin: Secondary | ICD-10-CM

## 2016-10-16 DIAGNOSIS — Z0000 Encounter for general adult medical examination without abnormal findings: Secondary | ICD-10-CM | POA: Diagnosis not present

## 2016-10-16 DIAGNOSIS — E7800 Pure hypercholesterolemia, unspecified: Secondary | ICD-10-CM | POA: Diagnosis not present

## 2016-10-16 DIAGNOSIS — G629 Polyneuropathy, unspecified: Secondary | ICD-10-CM | POA: Diagnosis not present

## 2016-10-16 DIAGNOSIS — F32A Depression, unspecified: Secondary | ICD-10-CM

## 2016-10-16 DIAGNOSIS — Z23 Encounter for immunization: Secondary | ICD-10-CM | POA: Diagnosis not present

## 2016-10-16 NOTE — Progress Notes (Signed)
Patient ID: [name], female   DOB: 08/11/1945, 71 y.o.   MRN: [id number]   Subjective:    Patient ID: [name], female    DOB: 1945-10-28, 71 y.o.   MRN: [id number]  HPI  Patient here for her physical exam.  She has lost weight.  On questioning her, she has decreased her sugar intake.  No chest pain.  Breathing stable.  She is eating regular meals.  Increased stress and some anxiety.  She called Dr Waylan Boga office and states she was referred to [facility].  She plans to call them for f/u.  No acid reflux.  No abdominal pain.  Bowels moving.  She sees neurology.  Has peripheral neuropathy.  On alpha lipoic acid now.  Also has degenerative disc disease.  She is complaining of arm numbness.  Request referral back to Dr Manuella Ghazi for evaluation.  No weakness.     Past Medical History:  Diagnosis Date  . Anemia   . Anxiety   . Arthritis   . Burning feet syndrome   . Chronic insomnia   . Complication of anesthesia    "AFFECTS MY MEMORY"  . COPD (chronic obstructive pulmonary disease) ([place])    documented in outside records  . Depression   . Dysrhythmia    PALPITATIONS   . Edema    MILD ANKLE  . GERD (gastroesophageal reflux disease)   . Heart murmur   . History of stomach ulcers   . HOH (hard of hearing)    AIDS  . Hypercholesterolemia   . Hyperparathyroidism, primary ([place])   . Hypertension   . Neuropathy   . Osteoporosis   . Palpitations   . Scoliosis   . Shortness of breath    with exertion  . Tingling    both hands   Past Surgical History:  Procedure Laterality Date  . ANTERIOR CERVICAL DECOMP/DISCECTOMY FUSION  10/28/2011   Procedure: ANTERIOR CERVICAL DECOMPRESSION/DISCECTOMY FUSION 2 LEVELS;  Surgeon: Elaina Hoops, MD;  Location: [facility];  Service: Neurosurgery;  Laterality: N/A;  . BREAST BIOPSY Right 1992   neg  . BREAST SURGERY     RT BREAST CYST REMOVED 15 YRS AGO   . CATARACT EXTRACTION W/PHACO Right 10/30/2014   Procedure: CATARACT  EXTRACTION PHACO AND INTRAOCULAR LENS PLACEMENT (IOC);  Surgeon: Birder Robson, MD;  Location: [facility];  Service: Ophthalmology;  Laterality: Right;  US0:40 AP18.2 CDE7.42  casette lot #[id number]  . CATARACT EXTRACTION W/PHACO Left 11/27/2014   Procedure: CATARACT EXTRACTION PHACO AND INTRAOCULAR LENS PLACEMENT (IOC);  Surgeon: Birder Robson, MD;  Location: [facility];  Service: Ophthalmology;  Laterality: Left;  [place] 00:36.9 AP%: 18.9 CDE: 6.96  casette lot #  [id number]  . CYSTOCELE REPAIR N/A 05/13/2015   Procedure: ANTERIOR REPAIR (CYSTOCELE);  Surgeon: Brayton Mars, MD;  Location: [facility];  Service: Gynecology;  Laterality: N/A;  . CYSTOSCOPY  05/13/2015   Procedure: CYSTOSCOPY;  Surgeon: Brayton Mars, MD;  Location: [facility];  Service: Gynecology;;  . DILATION AND CURETTAGE OF UTERUS     X2   . JOINT REPLACEMENT     RT HIP  2013  (PARTIAL)   . PARATHYROIDECTOMY Left 08/25/2012   Procedure: PARATHYROIDECTOMY;  Surgeon: Earnstine Regal, MD;  Location: [facility];  Service: General;  Laterality: Left;  . PITUITARY SURGERY     FOR TUMOR 3 YRS   . TOTAL KNEE ARTHROPLASTY Right 07/18/2014   Procedure: TOTAL KNEE ARTHROPLASTY;  Surgeon: Laurice Record  Hooten, MD;  Location: [facility];  Service: Orthopedics;  Laterality: Right;  Marland Kitchen VAGINAL HYSTERECTOMY Bilateral 05/13/2015   Procedure: HYSTERECTOMY VAGINAL WITH BILATERAL SALPINGO OOPHERECTOMY;  Surgeon: Brayton Mars, MD;  Location: [facility];  Service: Gynecology;  Laterality: Bilateral;  . VAGINAL HYSTERECTOMY     bso ant repair   Family History  Problem Relation Age of Onset  . Glaucoma Mother   . Hypertension Mother   . Thyroid disease Mother   . Stroke Father   . Hypertension Father   . Cancer Neg Hx   . Diabetes Neg Hx    Social History   Social History  . Marital status: Divorced    Spouse name: N/A  . Number of children: N/A  . Years of education: N/A   Social History Main Topics  . Smoking status: Former Smoker      Quit date: 02/10/2008  . Smokeless tobacco: Never Used  . Alcohol use 0.0 oz/week     Comment: rare  . Drug use: No  . Sexual activity: Not Currently    Birth control/ protection: Post-menopausal   Other Topics Concern  . None   Social History Narrative  . None    Outpatient Encounter Prescriptions as of 10/16/2016  Medication Sig  . acetaminophen (TYLENOL) 500 MG tablet Take 1,000 mg by mouth every 6 (six) hours as needed for fever.   . Alpha-Lipoic Acid 600 MG CAPS Take by mouth.  Marland Kitchen amLODipine (NORVASC) 5 MG tablet Take 1 tablet (5 mg total) by mouth daily.  Marland Kitchen aspirin EC 81 MG tablet Take 81 mg by mouth daily.  Marland Kitchen atorvastatin (LIPITOR) 40 MG tablet TAKE ONE TABLET EVERY DAY  . Biotin (BIOTIN MAXIMUM STRENGTH) 10 MG TABS Take 1 tablet by mouth daily.  . chlorthalidone (HYGROTON) 25 MG tablet Take 1 tablet (25 mg total) by mouth daily.  . cholecalciferol (VITAMIN D) 1000 UNITS tablet Take 1,000 Units by mouth daily.   Marland Kitchen docusate sodium (COLACE) 100 MG capsule Take 100 mg by mouth 2 (two) times daily as needed for mild constipation.   . ferrous sulfate 325 (65 FE) MG tablet Take 325 mg by mouth daily.  . Glycerin-Polysorbate 80 (REFRESH DRY EYE THERAPY OP) Apply to eye.  . losartan (COZAAR) 100 MG tablet Take 1 tablet (100 mg total) by mouth daily.  . magnesium oxide (MAG-OX) 400 MG tablet Take 400 mg by mouth daily.  . Multiple Vitamins-Minerals (MULTIVITAMIN PO) Take 1 tablet by mouth daily.  . mupirocin ointment (BACTROBAN) 2 % Apply to affected area on foot bid  . pantoprazole (PROTONIX) 40 MG tablet TAKE ONE TABLET TWICE DAILY  . Polyethyl Glycol-Propyl Glycol (SYSTANE OP) Apply 1 drop to eye once.  . potassium chloride (K-DUR) 10 MEQ tablet Take 1 tablet (10 mEq total) by mouth daily.  . Probiotic Product (FLORAJEN3 PO) Take 1 tablet by mouth.  . ranitidine (ZANTAC) 150 MG tablet Take 150 mg by mouth 2 (two) times daily.  Marland Kitchen gabapentin (NEURONTIN) 300 MG capsule Take 300 mg  by mouth at bedtime.   No facility-administered encounter medications on file as of 10/16/2016.     Review of Systems  Constitutional: Negative for appetite change.       Has adjusted her diet.  Decreased sugar intake.  Has lost weight.    HENT: Negative for congestion and sinus pressure.   Eyes: Negative for pain and visual disturbance.  Respiratory: Negative for cough, chest tightness and shortness of breath.   Cardiovascular:  Negative for chest pain and palpitations.  Gastrointestinal: Negative for abdominal pain, diarrhea, nausea and vomiting.  Genitourinary: Negative for difficulty urinating and dysuria.  Musculoskeletal: Negative for joint swelling and myalgias.  Skin: Negative for color change and rash.  Neurological: Negative for dizziness, light-headedness and headaches.       Arm numbness as outlined.   Hematological: Negative for adenopathy. Does not bruise/bleed easily.  Psychiatric/Behavioral: Negative for agitation and dysphoric mood.       Objective:    Physical Exam  Constitutional: She is oriented to person, place, and time. She appears well-developed and well-nourished. No distress.  HENT:  Nose: Nose normal.  Mouth/Throat: Oropharynx is clear and moist.  Eyes: Right eye exhibits no discharge. Left eye exhibits no discharge. No scleral icterus.  Neck: Neck supple. No thyromegaly present.  Cardiovascular: Normal rate and regular rhythm.   Pulmonary/Chest: Breath sounds normal. No accessory muscle usage. No tachypnea. No respiratory distress. She has no decreased breath sounds. She has no wheezes. She has no rhonchi. Right breast exhibits no inverted nipple, no mass, no nipple discharge and no tenderness (no axillary adenopathy). Left breast exhibits no inverted nipple, no mass, no nipple discharge and no tenderness (no axilarry adenopathy).  Abdominal: Soft. Bowel sounds are normal. There is no tenderness.  Musculoskeletal: She exhibits no edema or tenderness.  Grip  strength appears to be equal - bilateral.   Lymphadenopathy:    She has no cervical adenopathy.  Neurological: She is alert and oriented to person, place, and time.  Skin: Skin is warm. No rash noted. No erythema.  Psychiatric: She has a normal mood and affect. Her behavior is normal.    BP 132/64 (BP Location: Left Arm, Patient Position: Sitting, Cuff Size: Normal)   Pulse 60   Temp 98.6 F (37 C) (Oral)   Resp 14   Ht 5' 6.53" (1.69 m)   Wt 136 lb 12.8 oz (62.1 kg)   SpO2 98%   BMI 21.73 kg/m  Wt Readings from Last 3 Encounters:  10/16/16 136 lb 12.8 oz (62.1 kg)  07/10/16 143 lb (64.9 kg)  05/12/16 150 lb 3.2 oz (68.1 kg)     Lab Results  Component Value Date   WBC 6.9 05/26/2016   HGB 14.2 05/26/2016   HCT 41.9 05/26/2016   PLT 302.0 05/26/2016   GLUCOSE 97 10/15/2016   CHOL 184 10/15/2016   TRIG 99.0 10/15/2016   HDL 67.20 10/15/2016   LDLDIRECT 86.0 05/26/2016   LDLCALC 97 10/15/2016   ALT 23 10/15/2016   AST 18 10/15/2016   NA 142 10/15/2016   K 3.7 10/15/2016   CL 101 10/15/2016   CREATININE 0.69 10/15/2016   BUN 15 10/15/2016   CO2 34 (H) 10/15/2016   TSH 1.55 10/15/2016   INR 0.91 07/05/2014   HGBA1C 5.8 05/26/2016    Mm Screening Breast Tomo Bilateral  Result Date: 09/25/2016 CLINICAL DATA:  Screening. EXAM: 2D DIGITAL SCREENING BILATERAL MAMMOGRAM WITH CAD AND ADJUNCT TOMO COMPARISON:  Previous exam(s). ACR Breast Density Category c: The breast tissue is heterogeneously dense, which may obscure small masses. FINDINGS: There are no findings suspicious for malignancy. Images were processed with CAD. IMPRESSION: No mammographic evidence of malignancy. A result letter of this screening mammogram will be mailed directly to the patient. RECOMMENDATION: Screening mammogram in one year. (Code:SM-B-01Y) BI-RADS CATEGORY  1: Negative. Electronically Signed   By: Lajean Manes M.D.   On: 09/25/2016 11:02       Assessment &  Plan:   Problem List Items  Addressed This Visit    Anxiety    F/u with Behavioral Health as outlined.        Depression    Plans to f/u with [facility].  She plans to call for f/u.       Health care maintenance    Physical today 10/16/16.  Mammogram 09/25/16 - Birads I.  Colonoscopy 09/15/13.        Hypercholesterolemia    On lipitor.  Low cholesterol diet and exercise.  Follow lipid panel and liver function tests.        Hyperparathyroidism, primary ([place])    S/p surgery.  Followed by Dr Harlow Asa.  Follow calcium level.        Hypertension    Blood pressure as outlined.  Same medication regimen.  Follow pressures.  Follow metabolic panel.        Neuropathy    Has seen Dr Manuella Ghazi.  On alpha lipoic acid.  Per her report, has not tolerated other medications.  Follow.         Other Visit Diagnoses    Routine general medical examination at a health care facility    -  Primary   Encounter for immunization       Relevant Orders   Flu vaccine HIGH DOSE PF (Completed)   Arm numbness       Relevant Orders   Ambulatory referral to Neurology       Einar Pheasant, MD

## 2016-10-16 NOTE — Assessment & Plan Note (Signed)
Physical today 10/16/16.  Mammogram 09/25/16 - Birads I.  Colonoscopy 09/15/13.

## 2016-10-18 ENCOUNTER — Encounter: Payer: Self-pay | Admitting: Internal Medicine

## 2016-10-18 NOTE — Assessment & Plan Note (Signed)
Has seen Dr Manuella Ghazi.  On alpha lipoic acid.  Per her report, has not tolerated other medications.  Follow.

## 2016-10-18 NOTE — Assessment & Plan Note (Signed)
Plans to f/u with [facility].  She plans to call for f/u.

## 2016-10-18 NOTE — Assessment & Plan Note (Signed)
Blood pressure as outlined.  Same medication regimen.  Follow pressures.  Follow metabolic panel.  

## 2016-10-18 NOTE — Assessment & Plan Note (Signed)
F/u with Behavioral Health as outlined.

## 2016-10-18 NOTE — Assessment & Plan Note (Signed)
On lipitor.  Low cholesterol diet and exercise.  Follow lipid panel and liver function tests.   

## 2016-10-18 NOTE — Assessment & Plan Note (Signed)
S/p surgery.  Followed by Dr Harlow Asa.  Follow calcium level.

## 2016-10-19 ENCOUNTER — Encounter: Payer: Medicare Other | Admitting: Internal Medicine

## 2016-11-09 ENCOUNTER — Telehealth: Payer: Self-pay | Admitting: *Deleted

## 2016-11-09 NOTE — Telephone Encounter (Signed)
Pt has requested to have a referral for concealing , such as [place] behavior [facility].  Patient requested to have someone to talk to in reference to things surfacing from her childhood.  Pt contact [phone]

## 2016-11-09 NOTE — Telephone Encounter (Signed)
Called to patient she does not have any thought of hurting herself or anyone else. She would like referral to local provider.

## 2016-11-10 NOTE — Telephone Encounter (Signed)
Pt has seen Karen San Marino before. Will try to call and make appt with her. Will try Bambi Cottle if [facility] is booked full.

## 2016-11-10 NOTE — Telephone Encounter (Signed)
We can give her names and information of some local counselors.  She will need to call and contact regarding an appointment.  Names:  Bambi Cottle and Karen San Marino are a couple of local counselors we have used.  Will need to get phone numbers and information for pt.  See me if questions.

## 2016-12-16 ENCOUNTER — Other Ambulatory Visit: Payer: Self-pay | Admitting: Internal Medicine

## 2016-12-17 ENCOUNTER — Ambulatory Visit: Payer: Medicare Other | Admitting: Internal Medicine

## 2016-12-17 ENCOUNTER — Encounter: Payer: Self-pay | Admitting: Internal Medicine

## 2016-12-17 DIAGNOSIS — I10 Essential (primary) hypertension: Secondary | ICD-10-CM

## 2016-12-17 DIAGNOSIS — I8392 Asymptomatic varicose veins of left lower extremity: Secondary | ICD-10-CM | POA: Diagnosis not present

## 2016-12-17 DIAGNOSIS — F32A Depression, unspecified: Secondary | ICD-10-CM

## 2016-12-17 DIAGNOSIS — E7800 Pure hypercholesterolemia, unspecified: Secondary | ICD-10-CM | POA: Diagnosis not present

## 2016-12-17 DIAGNOSIS — G629 Polyneuropathy, unspecified: Secondary | ICD-10-CM | POA: Diagnosis not present

## 2016-12-17 DIAGNOSIS — F329 Major depressive disorder, single episode, unspecified: Secondary | ICD-10-CM | POA: Diagnosis not present

## 2016-12-17 DIAGNOSIS — E210 Primary hyperparathyroidism: Secondary | ICD-10-CM | POA: Diagnosis not present

## 2016-12-17 NOTE — Progress Notes (Signed)
Patient ID: [name], female   DOB: 04/26/1945, 71 y.o.   MRN: [id number]   Subjective:    Patient ID: [name], female    DOB: November 11, 1945, 71 y.o.   MRN: [id number]  HPI  Patient here for a scheduled follow up.  States she is doing relatively well.  Recently evaluated by podiatry for foot pain.  Found to have dislocation per note.  Is better.  She has been trying to watch what she eats.  Weight is down some.  Increased stress.  She is planning to f/u with counseling/psych.  Going to give information today regarding establishing an appt.  No chest pain.  No sob.  No acid reflux.  No abdominal pain.  Bowels moving.  Is concerned regarding veins in her legs.  Request referral to vascular surgery.     Past Medical History:  Diagnosis Date  . Anemia   . Anxiety   . Arthritis   . Burning feet syndrome   . Chronic insomnia   . Complication of anesthesia    "AFFECTS MY MEMORY"  . COPD (chronic obstructive pulmonary disease) ([place])    documented in outside records  . Depression   . Dysrhythmia    PALPITATIONS   . Edema    MILD ANKLE  . GERD (gastroesophageal reflux disease)   . Heart murmur   . History of stomach ulcers   . HOH (hard of hearing)    AIDS  . Hypercholesterolemia   . Hyperparathyroidism, primary ([place])   . Hypertension   . Neuropathy   . Osteoporosis   . Palpitations   . Scoliosis   . Shortness of breath    with exertion  . Tingling    both hands   Past Surgical History:  Procedure Laterality Date  . BREAST BIOPSY Right 1992   neg  . BREAST SURGERY     RT BREAST CYST REMOVED 15 YRS AGO   . DILATION AND CURETTAGE OF UTERUS     X2   . JOINT REPLACEMENT     RT HIP  2013  (PARTIAL)   . PITUITARY SURGERY     FOR TUMOR 3 YRS   . VAGINAL HYSTERECTOMY     bso ant repair   Family History  Problem Relation Age of Onset  . Glaucoma Mother   . Hypertension Mother   . Thyroid disease Mother   . Stroke Father   . Hypertension Father   . Cancer Neg  Hx   . Diabetes Neg Hx    Social History   Socioeconomic History  . Marital status: Divorced    Spouse name: None  . Number of children: None  . Years of education: None  . Highest education level: None  Social Needs  . Financial resource strain: None  . Food insecurity - worry: None  . Food insecurity - inability: None  . Transportation needs - medical: None  . Transportation needs - non-medical: None  Occupational History  . None  Tobacco Use  . Smoking status: Former Smoker    Last attempt to quit: 02/10/2008    Years since quitting: 8.8  . Smokeless tobacco: Never Used  Substance and Sexual Activity  . Alcohol use: Yes    Alcohol/week: 0.0 oz    Comment: rare  . Drug use: No  . Sexual activity: Not Currently    Birth control/protection: Post-menopausal  Other Topics Concern  . None  Social History Narrative  . None    Outpatient Encounter  Medications as of 12/17/2016  Medication Sig  . acetaminophen (TYLENOL) 500 MG tablet Take 1,000 mg by mouth every 6 (six) hours as needed for fever.   . Alpha-Lipoic Acid 600 MG CAPS Take by mouth.  Marland Kitchen amLODipine (NORVASC) 5 MG tablet Take 1 tablet (5 mg total) by mouth daily.  Marland Kitchen aspirin EC 81 MG tablet Take 81 mg by mouth daily.  Marland Kitchen atorvastatin (LIPITOR) 40 MG tablet TAKE ONE TABLET EVERY DAY  . Biotin (BIOTIN MAXIMUM STRENGTH) 10 MG TABS Take 1 tablet by mouth daily.  . chlorthalidone (HYGROTON) 25 MG tablet Take 1 tablet (25 mg total) by mouth daily.  . cholecalciferol (VITAMIN D) 1000 UNITS tablet Take 1,000 Units by mouth daily.   Marland Kitchen docusate sodium (COLACE) 100 MG capsule Take 100 mg by mouth 2 (two) times daily as needed for mild constipation.   . ferrous sulfate 325 (65 FE) MG tablet Take 325 mg by mouth daily.  Marland Kitchen gabapentin (NEURONTIN) 300 MG capsule Take 300 mg by mouth at bedtime.  . Glycerin-Polysorbate 80 (REFRESH DRY EYE THERAPY OP) Apply to eye.  . magnesium oxide (MAG-OX) 400 MG tablet Take 400 mg by mouth daily.    . Multiple Vitamins-Minerals (MULTIVITAMIN PO) Take 1 tablet by mouth daily.  . mupirocin ointment (BACTROBAN) 2 % Apply to affected area on foot bid  . pantoprazole (PROTONIX) 40 MG tablet TAKE ONE TABLET TWICE DAILY  . potassium chloride (K-DUR) 10 MEQ tablet Take 1 tablet (10 mEq total) by mouth daily.  . Probiotic Product (FLORAJEN3 PO) Take 1 tablet by mouth.  . ranitidine (ZANTAC) 150 MG tablet Take 150 mg by mouth 2 (two) times daily.  . [DISCONTINUED] Polyethyl Glycol-Propyl Glycol (SYSTANE OP) Apply 1 drop to eye once.  Marland Kitchen losartan (COZAAR) 100 MG tablet Take 1 tablet (100 mg total) daily by mouth.  . [DISCONTINUED] losartan (COZAAR) 100 MG tablet Take 1 tablet (100 mg total) by mouth daily.   No facility-administered encounter medications on file as of 12/17/2016.     Review of Systems  Constitutional: Negative for appetite change and unexpected weight change.       Has adjusted diet and lost weight.   HENT: Negative for congestion and sinus pressure.   Respiratory: Negative for cough, chest tightness and shortness of breath.   Cardiovascular: Negative for chest pain and palpitations.  Gastrointestinal: Negative for abdominal pain, diarrhea, nausea and vomiting.  Genitourinary: Negative for difficulty urinating and dysuria.  Musculoskeletal: Negative for joint swelling and myalgias.  Skin: Negative for color change and rash.  Neurological: Negative for dizziness, light-headedness and headaches.  Psychiatric/Behavioral:       Increased stress as outlined.         Objective:    Physical Exam  Constitutional: She appears well-developed. No distress.  HENT:  Nose: Nose normal.  Mouth/Throat: Oropharynx is clear and moist.  Neck: Neck supple. No thyromegaly present.  Cardiovascular: Normal rate and regular rhythm.  Pulmonary/Chest: Breath sounds normal. No respiratory distress. She has no wheezes.  Abdominal: Soft. Bowel sounds are normal. There is no tenderness.   Musculoskeletal: She exhibits no tenderness.  No increased edema present.   Lymphadenopathy:    She has no cervical adenopathy.  Skin: No rash noted. No erythema.  Psychiatric: She has a normal mood and affect. Her behavior is normal.    BP 116/82 (BP Location: Left Arm, Patient Position: Sitting, Cuff Size: Normal)   Pulse (!) 58   Temp (!) 97.5 F (36.4 C) (  Oral)   Resp 15   Wt 132 lb 12.8 oz (60.2 kg)   SpO2 97%   BMI 21.09 kg/m  Wt Readings from Last 3 Encounters:  12/17/16 132 lb 12.8 oz (60.2 kg)  10/16/16 136 lb 12.8 oz (62.1 kg)  07/10/16 143 lb (64.9 kg)     Lab Results  Component Value Date   WBC 6.9 05/26/2016   HGB 14.2 05/26/2016   HCT 41.9 05/26/2016   PLT 302.0 05/26/2016   GLUCOSE 97 10/15/2016   CHOL 184 10/15/2016   TRIG 99.0 10/15/2016   HDL 67.20 10/15/2016   LDLDIRECT 86.0 05/26/2016   LDLCALC 97 10/15/2016   ALT 23 10/15/2016   AST 18 10/15/2016   NA 142 10/15/2016   K 3.7 10/15/2016   CL 101 10/15/2016   CREATININE 0.69 10/15/2016   BUN 15 10/15/2016   CO2 34 (H) 10/15/2016   TSH 1.55 10/15/2016   INR 0.91 07/05/2014   HGBA1C 5.8 05/26/2016    Mm Screening Breast Tomo Bilateral  Result Date: 09/25/2016 CLINICAL DATA:  Screening. EXAM: 2D DIGITAL SCREENING BILATERAL MAMMOGRAM WITH CAD AND ADJUNCT TOMO COMPARISON:  Previous exam(s). ACR Breast Density Category c: The breast tissue is heterogeneously dense, which may obscure small masses. FINDINGS: There are no findings suspicious for malignancy. Images were processed with CAD. IMPRESSION: No mammographic evidence of malignancy. A result letter of this screening mammogram will be mailed directly to the patient. RECOMMENDATION: Screening mammogram in one year. (Code:SM-B-01Y) BI-RADS CATEGORY  1: Negative. Electronically Signed   By: Lajean Manes M.D.   On: 09/25/2016 11:02       Assessment & Plan:   Problem List Items Addressed This Visit    Depression    Plans to f/u with psych today  - to establish an appt.       Hypercholesterolemia    On lipitor.  Has adjusted diet.  Follow lipid panel and liver function test.        Relevant Medications   losartan (COZAAR) 100 MG tablet   Other Relevant Orders   Hepatic function panel   Lipid panel   Hyperparathyroidism, primary [facility])    S/p surgery - Dr Harlow Asa.  Follow calcium level.       Hypertension    Blood pressure improved.  Continue same medication regimen.  Follow pressures.  Follow metabolic panel.        Relevant Medications   losartan (COZAAR) 100 MG tablet   Other Relevant Orders   Basic metabolic panel   Neuropathy    Back on alpha lipoic acid.  Stable.  Follow.  Continue f/u with neurology.        Varicose veins of left lower extremity    She is concerned about her legs.  Discussed compression hose. She request referral to vascular surgery for further evaluation and treatment recs.        Relevant Medications   losartan (COZAAR) 100 MG tablet   Other Relevant Orders   Ambulatory referral to Vascular Surgery       Einar Pheasant, MD

## 2016-12-18 ENCOUNTER — Ambulatory Visit: Payer: Medicare Other | Admitting: Internal Medicine

## 2016-12-19 ENCOUNTER — Other Ambulatory Visit: Payer: Self-pay | Admitting: Internal Medicine

## 2016-12-20 ENCOUNTER — Encounter: Payer: Self-pay | Admitting: Internal Medicine

## 2016-12-20 DIAGNOSIS — I83813 Varicose veins of bilateral lower extremities with pain: Secondary | ICD-10-CM | POA: Insufficient documentation

## 2016-12-20 MED ORDER — LOSARTAN POTASSIUM 100 MG PO TABS: 100.0000 mg | ORAL_TABLET | Freq: Every day | ORAL | 0 refills | Status: DC

## 2016-12-20 NOTE — Assessment & Plan Note (Signed)
She is concerned about her legs.  Discussed compression hose. She request referral to vascular surgery for further evaluation and treatment recs.

## 2016-12-20 NOTE — Assessment & Plan Note (Signed)
S/p surgery - Dr Harlow Asa.  Follow calcium level.

## 2016-12-20 NOTE — Assessment & Plan Note (Signed)
On lipitor.  Has adjusted diet.  Follow lipid panel and liver function test.

## 2016-12-20 NOTE — Assessment & Plan Note (Signed)
Plans to f/u with psych today - to establish an appt.

## 2016-12-20 NOTE — Assessment & Plan Note (Signed)
Blood pressure improved. Continue same medication regimen.  Follow pressures.  Follow metabolic panel.  

## 2016-12-20 NOTE — Assessment & Plan Note (Signed)
Back on alpha lipoic acid.  Stable.  Follow.  Continue f/u with neurology.

## 2017-01-08 DIAGNOSIS — R42 Dizziness and giddiness: Secondary | ICD-10-CM | POA: Diagnosis not present

## 2017-01-08 DIAGNOSIS — R001 Bradycardia, unspecified: Secondary | ICD-10-CM | POA: Diagnosis not present

## 2017-01-08 DIAGNOSIS — G608 Other hereditary and idiopathic neuropathies: Secondary | ICD-10-CM | POA: Diagnosis not present

## 2017-01-11 ENCOUNTER — Encounter (INDEPENDENT_AMBULATORY_CARE_PROVIDER_SITE_OTHER): Payer: Self-pay | Admitting: Vascular Surgery

## 2017-01-11 ENCOUNTER — Ambulatory Visit (INDEPENDENT_AMBULATORY_CARE_PROVIDER_SITE_OTHER): Payer: Medicare Other | Admitting: Vascular Surgery

## 2017-01-11 VITALS — BP 117/78 | HR 54 | Resp 16 | Ht 64.0 in | Wt 135.0 lb

## 2017-01-11 DIAGNOSIS — G629 Polyneuropathy, unspecified: Secondary | ICD-10-CM

## 2017-01-11 DIAGNOSIS — I952 Hypotension due to drugs: Secondary | ICD-10-CM | POA: Diagnosis not present

## 2017-01-11 DIAGNOSIS — I10 Essential (primary) hypertension: Secondary | ICD-10-CM | POA: Diagnosis not present

## 2017-01-11 DIAGNOSIS — M5136 Other intervertebral disc degeneration, lumbar region: Secondary | ICD-10-CM

## 2017-01-11 DIAGNOSIS — I83813 Varicose veins of bilateral lower extremities with pain: Secondary | ICD-10-CM

## 2017-01-11 DIAGNOSIS — E7800 Pure hypercholesterolemia, unspecified: Secondary | ICD-10-CM | POA: Diagnosis not present

## 2017-01-11 DIAGNOSIS — R001 Bradycardia, unspecified: Secondary | ICD-10-CM | POA: Diagnosis not present

## 2017-01-11 NOTE — Progress Notes (Signed)
Subjective:    Patient ID: [name], female    DOB: 11/28/1945, 71 y.o.   MRN: [id number] Chief Complaint  Patient presents with  . New Patient (Initial Visit)    Varicose Veins   Presents as a new patient referred by Dr. Nicki Reaper for evaluation of bilateral lower extremity varicose veins.  The patient endorses a long-standing history of varicose veins located to the bilateral lower extremity.  Her varicosities have progressively worsened over the last few years.  The patient notes they have increased in size and discomfort.  The patient's discomfort has progressed to the point she is unable to function on a daily basis.  Her symptoms have become lifestyle limiting.  She notes her varicosities are associated with some mild edema.  At this time the patient does not engage in conservative therapy including wearing medical grade 1 compression stockings and elevating her legs.  The patient is status post a right total knee replacement.  Denies any recent trauma to her lower extremity.  The patient denies any claudication-like symptoms, rest pain or ulceration to the lower extremity.  The patient denies any fever, nausea or vomiting.   Review of Systems  Constitutional: Negative.   HENT: Negative.   Eyes: Negative.   Respiratory: Negative.   Cardiovascular: Positive for leg swelling.       Painful varicose veins  Gastrointestinal: Negative.   Endocrine: Negative.   Genitourinary: Negative.   Musculoskeletal: Negative.   Skin: Negative.   Allergic/Immunologic: Negative.   Neurological: Negative.   Hematological: Negative.   Psychiatric/Behavioral: Negative.       Objective:   Physical Exam  Constitutional: She is oriented to person, place, and time. She appears well-developed and well-nourished. No distress.  HENT:  Head: Normocephalic and atraumatic.  Eyes: Conjunctivae are normal. Pupils are equal, round, and reactive to light.  Neck: Normal range of motion.  Cardiovascular:  Normal rate, regular rhythm, normal heart sounds and intact distal pulses.  Pulses:      Radial pulses are 2+ on the right side, and 2+ on the left side.       Dorsalis pedis pulses are 2+ on the right side, and 2+ on the left side.       Posterior tibial pulses are 2+ on the right side, and 2+ on the left side.  Pulmonary/Chest: Effort normal and breath sounds normal.  Musculoskeletal: Normal range of motion. She exhibits edema (Mild nonpitting bilateral edema noted).  Neurological: She is alert and oriented to person, place, and time.  Skin: She is not diaphoretic.  There is a mixture of greater than 1 cm and less than 1 cm varicosities scattered to the bilateral lower extremity.  There is no stasis dermatitis.  There is no skin changes.  There is no cellulitis.  Psychiatric: She has a normal mood and affect. Her behavior is normal. Judgment and thought content normal.  Vitals reviewed.  BP 117/78 (BP Location: Right Arm, Patient Position: Sitting)   Pulse (!) 54   Resp 16   Ht 5\' 4"  (1.626 m)   Wt 135 lb (61.2 kg)   BMI 23.17 kg/m   Past Medical History:  Diagnosis Date  . Anemia   . Anxiety   . Arthritis   . Burning feet syndrome   . Chronic insomnia   . Complication of anesthesia    "AFFECTS MY MEMORY"  . COPD (chronic obstructive pulmonary disease) ([place])    documented in outside records  . Depression   .  Dysrhythmia    PALPITATIONS   . Edema    MILD ANKLE  . GERD (gastroesophageal reflux disease)   . Heart murmur   . History of stomach ulcers   . HOH (hard of hearing)    AIDS  . Hypercholesterolemia   . Hyperparathyroidism, primary ([place])   . Hypertension   . Neuropathy   . Osteoporosis   . Palpitations   . Scoliosis   . Shortness of breath    with exertion  . Tingling    both hands   Social History   Socioeconomic History  . Marital status: Divorced    Spouse name: Not on file  . Number of children: Not on file  . Years of education: Not on file  .  Highest education level: Not on file  Social Needs  . Financial resource strain: Not on file  . Food insecurity - worry: Not on file  . Food insecurity - inability: Not on file  . Transportation needs - medical: Not on file  . Transportation needs - non-medical: Not on file  Occupational History  . Not on file  Tobacco Use  . Smoking status: Former Smoker    Last attempt to quit: 02/10/2008    Years since quitting: 8.9  . Smokeless tobacco: Never Used  Substance and Sexual Activity  . Alcohol use: Yes    Alcohol/week: 0.0 oz    Comment: rare  . Drug use: No  . Sexual activity: Not Currently    Birth control/protection: Post-menopausal  Other Topics Concern  . Not on file  Social History Narrative  . Not on file   Past Surgical History:  Procedure Laterality Date  . ANTERIOR CERVICAL DECOMP/DISCECTOMY FUSION  10/28/2011   Procedure: ANTERIOR CERVICAL DECOMPRESSION/DISCECTOMY FUSION 2 LEVELS;  Surgeon: Elaina Hoops, MD;  Location: [facility];  Service: Neurosurgery;  Laterality: N/A;  . BREAST BIOPSY Right 1992   neg  . BREAST SURGERY     RT BREAST CYST REMOVED 15 YRS AGO   . CATARACT EXTRACTION W/PHACO Right 10/30/2014   Procedure: CATARACT EXTRACTION PHACO AND INTRAOCULAR LENS PLACEMENT (IOC);  Surgeon: Birder Robson, MD;  Location: [facility];  Service: Ophthalmology;  Laterality: Right;  US0:40 AP18.2 CDE7.42  casette lot #[id number]  . CATARACT EXTRACTION W/PHACO Left 11/27/2014   Procedure: CATARACT EXTRACTION PHACO AND INTRAOCULAR LENS PLACEMENT (IOC);  Surgeon: Birder Robson, MD;  Location: [facility];  Service: Ophthalmology;  Laterality: Left;  [place] 00:36.9 AP%: 18.9 CDE: 6.96  casette lot #  [id number]  . CYSTOCELE REPAIR N/A 05/13/2015   Procedure: ANTERIOR REPAIR (CYSTOCELE);  Surgeon: Brayton Mars, MD;  Location: [facility];  Service: Gynecology;  Laterality: N/A;  . CYSTOSCOPY  05/13/2015   Procedure: CYSTOSCOPY;  Surgeon: Brayton Mars, MD;  Location:  [facility];  Service: Gynecology;;  . DILATION AND CURETTAGE OF UTERUS     X2   . JOINT REPLACEMENT     RT HIP  2013  (PARTIAL)   . PARATHYROIDECTOMY Left 08/25/2012   Procedure: PARATHYROIDECTOMY;  Surgeon: Earnstine Regal, MD;  Location: [facility];  Service: General;  Laterality: Left;  . PITUITARY SURGERY     FOR TUMOR 3 YRS   . TOTAL KNEE ARTHROPLASTY Right 07/18/2014   Procedure: TOTAL KNEE ARTHROPLASTY;  Surgeon: Dereck Leep, MD;  Location: [facility];  Service: Orthopedics;  Laterality: Right;  Marland Kitchen VAGINAL HYSTERECTOMY Bilateral 05/13/2015   Procedure: HYSTERECTOMY VAGINAL WITH BILATERAL SALPINGO OOPHERECTOMY;  Surgeon: Brayton Mars, MD;  Location: [facility];  Service: Gynecology;  Laterality: Bilateral;  . VAGINAL HYSTERECTOMY     bso ant repair   Family History  Problem Relation Age of Onset  . Glaucoma Mother   . Hypertension Mother   . Thyroid disease Mother   . Stroke Father   . Hypertension Father   . Cancer Neg Hx   . Diabetes Neg Hx    Allergies  Allergen Reactions  . Sulfamethoxazole-Trimethoprim Other (See Comments)    GI Upset  . Citalopram Hives  . Tape Itching      Assessment & Plan:  Presents as a new patient referred by Dr. Nicki Reaper for evaluation of bilateral lower extremity varicose veins.  The patient endorses a long-standing history of varicose veins located to the bilateral lower extremity.  Her varicosities have progressively worsened over the last few years.  The patient notes they have increased in size and discomfort.  The patient's discomfort has progressed to the point she is unable to function on a daily basis.  Her symptoms have become lifestyle limiting.  She notes her varicosities are associated with some mild edema.  At this time the patient does not engage in conservative therapy including wearing medical grade 1 compression stockings and elevating her legs.  The patient is status post a right total knee replacement.  Denies any recent trauma to her  lower extremity.  The patient denies any claudication-like symptoms, rest pain or ulceration to the lower extremity.  The patient denies any fever, nausea or vomiting.  1. Varicose veins of both lower extremities with pain - New The patient was encouraged to wear graduated compression stockings (20-30 mmHg) on a daily basis. The patient was instructed to begin wearing the stockings first thing in the morning and removing them in the evening. The patient was instructed specifically not to sleep in the stockings. Prescription In addition, behavioral modification including elevation during the day will be initiated. Anti-inflammatories for pain. The patient will follow up in three months to asses conservative management.  Information on chronic venous insufficiency and compression stockings was given to the patient. The patient was instructed to call the office in the interim if any worsening edema or ulcerations to the legs, feet or toes occurs. The patient expresses their understanding.  - VAS [place] LOWER EXTREMITY VENOUS REFLUX; Future  2. Neuropathy - Stable Contributing factor to the patient's bilateral lower extremity discomfort. This is managed by her primary care physician  3. DDD (degenerative disc disease), lumbar - Stable Contributing factor to the patient's lower extremity discomfort This is managed by her primary care physician  Current Outpatient Medications on File Prior to Visit  Medication Sig Dispense Refill  . acetaminophen (TYLENOL) 500 MG tablet Take 1,000 mg by mouth every 6 (six) hours as needed for fever.     . Alpha-Lipoic Acid 600 MG CAPS Take by mouth.    Marland Kitchen amLODipine (NORVASC) 5 MG tablet Take 1 tablet (5 mg total) by mouth daily. 90 tablet 1  . aspirin EC 81 MG tablet Take 81 mg by mouth daily.    Marland Kitchen atorvastatin (LIPITOR) 40 MG tablet TAKE ONE TABLET EVERY DAY 90 tablet 1  . Biotin (BIOTIN MAXIMUM STRENGTH) 10 MG TABS Take 1 tablet by mouth daily.    .  chlorthalidone (HYGROTON) 25 MG tablet Take 1 tablet (25 mg total) by mouth daily. 90 tablet 1  . cholecalciferol (VITAMIN D) 1000 UNITS tablet Take 1,000 Units by mouth daily.     Marland Kitchen  docusate sodium (COLACE) 100 MG capsule Take 100 mg by mouth 2 (two) times daily as needed for mild constipation.     . ferrous sulfate 325 (65 FE) MG tablet Take 325 mg by mouth daily.    Marland Kitchen gabapentin (NEURONTIN) 300 MG capsule Take 300 mg by mouth at bedtime.    . Glycerin-Polysorbate 80 (REFRESH DRY EYE THERAPY OP) Apply to eye.    . losartan (COZAAR) 100 MG tablet Take 1 tablet (100 mg total) daily by mouth. 90 tablet 0  . losartan (COZAAR) 100 MG tablet TAKE 1 TABLET BY MOUTH DAILY 90 tablet 0  . magnesium oxide (MAG-OX) 400 MG tablet Take 400 mg by mouth daily.    . Multiple Vitamins-Minerals (MULTIVITAMIN PO) Take 1 tablet by mouth daily.    . mupirocin ointment (BACTROBAN) 2 % Apply to affected area on foot bid 22 g 0  . pantoprazole (PROTONIX) 40 MG tablet TAKE ONE TABLET TWICE DAILY 60 tablet 3  . potassium chloride (K-DUR) 10 MEQ tablet Take 1 tablet (10 mEq total) by mouth daily. 90 tablet 1  . Probiotic Product (FLORAJEN3 PO) Take 1 tablet by mouth.    . ranitidine (ZANTAC) 150 MG tablet Take 150 mg by mouth 2 (two) times daily.     No current facility-administered medications on file prior to visit.     There are no Patient Instructions on file for this visit. No Follow-up on file.   Yesena Reaves A Donnie Gedeon, PA-C

## 2017-01-18 ENCOUNTER — Other Ambulatory Visit: Payer: Self-pay | Admitting: Internal Medicine

## 2017-01-25 ENCOUNTER — Other Ambulatory Visit: Payer: Self-pay | Admitting: Internal Medicine

## 2017-02-01 ENCOUNTER — Ambulatory Visit: Payer: Medicare Other

## 2017-02-05 ENCOUNTER — Ambulatory Visit (INDEPENDENT_AMBULATORY_CARE_PROVIDER_SITE_OTHER): Payer: Medicare Other

## 2017-02-05 VITALS — BP 110/70 | HR 69 | Temp 97.8°F | Resp 14 | Ht 66.0 in | Wt 132.8 lb

## 2017-02-05 DIAGNOSIS — Z23 Encounter for immunization: Secondary | ICD-10-CM | POA: Diagnosis not present

## 2017-02-05 DIAGNOSIS — Z1159 Encounter for screening for other viral diseases: Secondary | ICD-10-CM | POA: Diagnosis not present

## 2017-02-05 DIAGNOSIS — Z0000 Encounter for general adult medical examination without abnormal findings: Secondary | ICD-10-CM | POA: Diagnosis not present

## 2017-02-05 NOTE — Progress Notes (Signed)
Subjective:   [name] is a 71 y.o. female who presents for Medicare Annual (Subsequent) preventive examination.  Review of Systems:  No ROS.  Medicare Wellness Visit. Additional risk factors are reflected in the social history.  Cardiac Risk Factors include: advanced age (>35men, >53 women);hypertension     Objective:     Vitals: BP 110/70 (BP Location: Left Arm, Patient Position: Sitting, Cuff Size: Normal)   Pulse 69   Temp 97.8 F (36.6 C) (Oral)   Resp 14   Ht 5\' 6"  (1.676 m)   Wt 132 lb 12.8 oz (60.2 kg)   BMI 21.43 kg/m   Body mass index is 21.43 kg/m.  Advanced Directives 02/05/2017 01/29/2016 05/13/2015 05/08/2015 07/18/2014 06/18/2014 08/22/2012  Does Patient Have a Medical Advance Directive? No Yes No Yes Yes Yes Patient does not have advance directive;Patient would not like information  Type of Advance Directive - [place];Living will - Healthcare Power of [place] -  Does patient want to make changes to medical advance directive? - No - Patient declined - - - No - Patient declined -  Copy of [place] in Chart? - No - copy requested - - - No - copy requested -  Would patient like information on creating a medical advance directive? Yes (MAU/Ambulatory/Procedural Areas - Information given) - - - - - -    Tobacco Social History   Tobacco Use  Smoking Status Former Smoker  . Last attempt to quit: 02/10/2008  . Years since quitting: 8.9  Smokeless Tobacco Never Used     Counseling given: Not Answered   Clinical Intake:  Pre-visit preparation completed: Yes  Pain : No/denies pain     Nutritional Status: BMI of 19-24  Normal Diabetes: No  How often do you need to have someone help you when you read instructions, pamphlets, or other written materials from your doctor or pharmacy?: 1 - Never  Interpreter Needed?: No     Past Medical History:   Diagnosis Date  . Anemia   . Anxiety   . Arthritis   . Burning feet syndrome   . Chronic insomnia   . Complication of anesthesia    "AFFECTS MY MEMORY"  . COPD (chronic obstructive pulmonary disease) ([place])    documented in outside records  . Depression   . Dysrhythmia    PALPITATIONS   . Edema    MILD ANKLE  . GERD (gastroesophageal reflux disease)   . Heart murmur   . History of stomach ulcers   . HOH (hard of hearing)    AIDS  . Hypercholesterolemia   . Hyperparathyroidism, primary ([place])   . Hypertension   . Neuropathy   . Osteoporosis   . Palpitations   . Scoliosis   . Shortness of breath    with exertion  . Tingling    both hands   Past Surgical History:  Procedure Laterality Date  . ANTERIOR CERVICAL DECOMP/DISCECTOMY FUSION  10/28/2011   Procedure: ANTERIOR CERVICAL DECOMPRESSION/DISCECTOMY FUSION 2 LEVELS;  Surgeon: Elaina Hoops, MD;  Location: [facility];  Service: Neurosurgery;  Laterality: N/A;  . BREAST BIOPSY Right 1992   neg  . BREAST SURGERY     RT BREAST CYST REMOVED 15 YRS AGO   . CATARACT EXTRACTION W/PHACO Right 10/30/2014   Procedure: CATARACT EXTRACTION PHACO AND INTRAOCULAR LENS PLACEMENT (IOC);  Surgeon: Birder Robson, MD;  Location: [facility];  Service:  Ophthalmology;  Laterality: Right;  US0:40 AP18.2 CDE7.42  [place] #[id number]  . CATARACT EXTRACTION W/PHACO Left 11/27/2014   Procedure: CATARACT EXTRACTION PHACO AND INTRAOCULAR LENS PLACEMENT (IOC);  Surgeon: Birder Robson, MD;  Location: [facility];  Service: Ophthalmology;  Laterality: Left;  [place] 00:36.9 AP%: 18.9 CDE: 6.96  [place] #  [id number]  . CYSTOCELE REPAIR N/A 05/13/2015   Procedure: ANTERIOR REPAIR (CYSTOCELE);  Surgeon: Brayton Mars, MD;  Location: [facility];  Service: Gynecology;  Laterality: N/A;  . CYSTOSCOPY  05/13/2015   Procedure: CYSTOSCOPY;  Surgeon: Brayton Mars, MD;  Location: [facility];  Service: Gynecology;;  . DILATION AND CURETTAGE OF UTERUS      X2   . JOINT REPLACEMENT     RT HIP  2013  (PARTIAL)   . PARATHYROIDECTOMY Left 08/25/2012   Procedure: PARATHYROIDECTOMY;  Surgeon: Earnstine Regal, MD;  Location: [facility];  Service: General;  Laterality: Left;  . PITUITARY SURGERY     FOR TUMOR 3 YRS   . TOTAL KNEE ARTHROPLASTY Right 07/18/2014   Procedure: TOTAL KNEE ARTHROPLASTY;  Surgeon: Dereck Leep, MD;  Location: [facility];  Service: Orthopedics;  Laterality: Right;  Marland Kitchen VAGINAL HYSTERECTOMY Bilateral 05/13/2015   Procedure: HYSTERECTOMY VAGINAL WITH BILATERAL SALPINGO OOPHERECTOMY;  Surgeon: Brayton Mars, MD;  Location: [facility];  Service: Gynecology;  Laterality: Bilateral;  . VAGINAL HYSTERECTOMY     bso ant repair   Family History  Problem Relation Age of Onset  . Glaucoma Mother   . Hypertension Mother   . Thyroid disease Mother   . Stroke Father   . Hypertension Father   . Cancer Neg Hx   . Diabetes Neg Hx    Social History   Socioeconomic History  . Marital status: Divorced    Spouse name: None  . Number of children: None  . Years of education: None  . Highest education level: None  Social Needs  . Financial resource strain: None  . Food insecurity - worry: None  . Food insecurity - inability: None  . Transportation needs - medical: None  . Transportation needs - non-medical: None  Occupational History  . Occupation: retired  Tobacco Use  . Smoking status: Former Smoker    Last attempt to quit: 02/10/2008    Years since quitting: 8.9  . Smokeless tobacco: Never Used  Substance and Sexual Activity  . Alcohol use: Yes    Alcohol/week: 0.0 oz    Comment: rare  . Drug use: No  . Sexual activity: No    Birth control/protection: Post-menopausal  Other Topics Concern  . None  Social History Narrative  . None    Outpatient Encounter Medications as of 02/05/2017  Medication Sig  . acetaminophen (TYLENOL) 500 MG tablet Take 1,000 mg by mouth every 6 (six) hours as needed for fever.   . Alpha-Lipoic  Acid 600 MG CAPS Take by mouth.  Marland Kitchen amLODipine (NORVASC) 5 MG tablet Take 1 tablet (5 mg total) by mouth daily.  Marland Kitchen aspirin EC 81 MG tablet Take 81 mg by mouth daily.  Marland Kitchen atorvastatin (LIPITOR) 40 MG tablet TAKE ONE TABLET EVERY DAY  . Biotin (BIOTIN MAXIMUM STRENGTH) 10 MG TABS Take 1 tablet by mouth daily.  . chlorthalidone (HYGROTON) 25 MG tablet Take 1 tablet (25 mg total) by mouth daily.  . chlorthalidone (HYGROTON) 25 MG tablet TAKE ONE TABLET EVERY DAY  . cholecalciferol (VITAMIN D) 1000 UNITS tablet Take 1,000 Units by mouth daily.   Marland Kitchen  docusate sodium (COLACE) 100 MG capsule Take 100 mg by mouth 2 (two) times daily as needed for mild constipation.   . ferrous sulfate 325 (65 FE) MG tablet Take 325 mg by mouth daily.  Marland Kitchen gabapentin (NEURONTIN) 300 MG capsule Take 300 mg by mouth at bedtime.  . Glycerin-Polysorbate 80 (REFRESH DRY EYE THERAPY OP) Apply to eye.  . losartan (COZAAR) 100 MG tablet Take 1 tablet (100 mg total) daily by mouth.  . losartan (COZAAR) 100 MG tablet TAKE 1 TABLET BY MOUTH DAILY  . magnesium oxide (MAG-OX) 400 MG tablet Take 400 mg by mouth daily.  . Multiple Vitamins-Minerals (MULTIVITAMIN PO) Take 1 tablet by mouth daily.  . mupirocin ointment (BACTROBAN) 2 % Apply to affected area on foot bid  . pantoprazole (PROTONIX) 40 MG tablet TAKE 1 TABLET BY MOUTH DAILY  . potassium chloride (K-DUR) 10 MEQ tablet Take 1 tablet (10 mEq total) by mouth daily.  . Probiotic Product (FLORAJEN3 PO) Take 1 tablet by mouth.  . ranitidine (ZANTAC) 150 MG tablet Take 150 mg by mouth 2 (two) times daily.   No facility-administered encounter medications on file as of 02/05/2017.     Activities of Daily Living In your present state of health, do you have any difficulty performing the following activities: 02/05/2017  Hearing? Y  Comment Hearing aid, bilateral  Vision? N  Difficulty concentrating or making decisions? Y  Comment Anxiety makes it difficulty to concentrating  Walking  or climbing stairs? N  Dressing or bathing? N  Doing errands, shopping? N  Preparing Food and eating ? N  Using the Toilet? N  In the past six months, have you accidently leaked urine? N  Do you have problems with loss of bowel control? N  Managing your Medications? N  Managing your Finances? N  Housekeeping or managing your Housekeeping? N  Some recent data might be hidden    Patient Care Team: Einar Pheasant, MD as PCP - General (Internal Medicine) Marry Guan, Laurice Record, MD (Orthopedic Surgery)    Assessment:   This is a routine wellness examination for [name]. The goal of the wellness visit is to assist the patient how to close the gaps in care and create a preventative care plan for the patient.   The roster of all physicians providing medical care to patient is listed in the Snapshot section of the chart.  Taking calcium VIT D as appropriate/Osteoporosis reviewed.    Safety issues reviewed; Smoke and carbon monoxide detectors in the home. No firearms in the home.  Wears seatbelts when driving or riding with others. Patient does wear sunscreen or protective clothing when in direct sunlight. No violence in the home.  Patient is alert, normal appearance, oriented to person/place/and time. Correctly identified the president of the [place], recall of 3/3 words, and performing simple calculations. Displays appropriate judgement and can read correct time from watch face.   No new identified risk were noted.  No failures at ADL's or IADL's.    BMI- discussed the importance of a healthy diet, water intake and the benefits of aerobic exercise.She plans to increase her walking regimen for exercise. Educational material provided.   24 hour diet recall: Regular diet  Daily fluid intake: 3 cups of caffeine, 5 cups of water  Dental- every 6 months.  Eye- Visual acuity not assessed per patient preference since they have regular follow up with the ophthalmologist.  Wears corrective  lenses.  Sleep patterns- Sleeps 5 hours at night.  Wakes feeling  rested.  Pneumovax 23 vaccine administered L deltoid, tolerated well.  No verbal complaint or distress. Educational material provided.  Hepatitis C Screening completed.  Educational material provided.  TDAP vaccine deferred per patient preference.  Follow up with insurance.  Educational material provided.  Patient Concerns: None at this time. Follow up with PCP as needed.  Exercise Activities and Dietary recommendations Current Exercise Habits: Home exercise routine, Type of exercise: calisthenics;stretching, Time (Minutes): 15, Frequency (Times/Week): 3, Weekly Exercise (Minutes/Week): 45, Intensity: Moderate  Goals    . Prevent Falls       Fall Risk Fall Risk  02/05/2017 01/29/2016 10/11/2015 06/18/2014 05/17/2014  Falls in the past year? No No No No Yes  Number falls in past yr: - - - - 2 or more  Injury with Fall? - - - - No  Risk for fall due to : - - - - Impaired balance/gait    Depression Screen PHQ 2/9 Scores 02/05/2017 01/29/2016 10/11/2015 06/18/2014  PHQ - 2 Score 1 2 3 2   PHQ- 9 Score 3 6 14 8      Cognitive Function MMSE - Mini Mental State Exam 06/18/2014  Orientation to time 5  Orientation to Place 5  Registration 3  Attention/ Calculation 5  Recall 3  Language- name 2 objects 2  Language- repeat 1  Language- follow 3 step command 3  Language- read & follow direction 1  Write a sentence 1  Copy design 1  Total score 30     6CIT Screen 02/05/2017 01/29/2016  What Year? 0 points 0 points  What month? 0 points 0 points  What time? 0 points 0 points  Count back from 20 0 points 0 points  Months in reverse 0 points 0 points  Repeat phrase 0 points -  Total Score 0 -    Immunization History  Administered Date(s) Administered  . Influenza Split 02/12/2012  . Influenza, High Dose Seasonal PF 10/11/2015, 10/16/2016  . Influenza,inj,Quad PF,6+ Mos 10/24/2012, 11/28/2013, 12/17/2014  .  Pneumococcal Conjugate-13 05/17/2014  . Pneumococcal Polysaccharide-23 02/05/2017    Screening Tests Health Maintenance  Topic Date Due  . TETANUS/TDAP  03[birth year]20/1964  . MAMMOGRAM  09/25/2018  . COLONOSCOPY  09/16/2023  . INFLUENZA VACCINE  Completed  . DEXA SCAN  Completed  . Hepatitis C Screening  Completed  . PNA vac Low Risk Adult  Completed       Plan:   End of life planning; Advanced aging; Advanced directives discussed.  No HCPOA/Living Will.  Additional information provided to help them start the conversation with family.  Copy of HCPOA/Living Will requested upon completion. Time spent on this topic is 20 minutes.  I have personally reviewed and noted the following in the patient's chart:   . Medical and social history . Use of alcohol, tobacco or illicit drugs  . Current medications and supplements . Functional ability and status . Nutritional status . Physical activity . Advanced directives . List of other physicians . Hospitalizations, surgeries, and ER visits in previous 12 months . Vitals . Screenings to include cognitive, depression, and falls . Referrals and appointments  In addition, I have reviewed and discussed with patient certain preventive protocols, quality metrics, and best practice recommendations. A written personalized care plan for preventive services as well as general preventive health recommendations were provided to patient.     OBrien-Blaney, Oumar Marcott L, LPN  [id number]   I have reviewed the above information and agree with above.   Deborra Medina, MD

## 2017-02-05 NOTE — Patient Instructions (Addendum)
  Ms. Mazzarella , Thank you for taking time to come for your Medicare Wellness Visit. I appreciate your ongoing commitment to your health goals. Please review the following plan we discussed and let me know if I can assist you in the future.   Follow up with Dr. Nicki Reaper as needed.    Bring a copy of your [place] and/or Living Will to be scanned into chart once completed.  Have a great day and Happy New Year!  These are the goals we discussed: Goals    . Prevent Falls       This is a list of the screening recommended for you and due dates:  Health Maintenance  Topic Date Due  . Tetanus Vaccine  03/20/1966  . Mammogram  09/25/2018  . Colon Cancer Screening  09/16/2023  . Flu Shot  Completed  . DEXA scan (bone density measurement)  Completed  .  Hepatitis C: One time screening is recommended by Center for Disease Control  (CDC) for  adults born from [birth year] through 1963.   Completed  . Pneumonia vaccines  Completed

## 2017-02-06 LAB — HEPATITIS C ANTIBODY
Hepatitis C Ab: NONREACTIVE
SIGNAL TO CUT-OFF: 0.01 (ref <1.00)

## 2017-02-09 HISTORY — PX: BREAST EXCISIONAL BIOPSY: SUR124

## 2017-03-30 ENCOUNTER — Other Ambulatory Visit (INDEPENDENT_AMBULATORY_CARE_PROVIDER_SITE_OTHER): Payer: Medicare Other

## 2017-03-30 DIAGNOSIS — E7800 Pure hypercholesterolemia, unspecified: Secondary | ICD-10-CM

## 2017-03-30 DIAGNOSIS — I10 Essential (primary) hypertension: Secondary | ICD-10-CM | POA: Diagnosis not present

## 2017-03-30 LAB — BASIC METABOLIC PANEL
BUN: 23 mg/dL (ref 6–23)
CALCIUM: 10.3 mg/dL (ref 8.4–10.5)
CO2: 37 meq/L — AB (ref 19–32)
CREATININE: 0.63 mg/dL (ref 0.40–1.20)
Chloride: 100 mEq/L (ref 96–112)
GFR: 99.29 mL/min (ref >60.00)
Glucose, Bld: 87 mg/dL (ref 70–99)
Potassium: 3.5 mEq/L (ref 3.5–5.1)
Sodium: 142 mEq/L (ref 135–145)

## 2017-03-30 LAB — LIPID PANEL
CHOL/HDL RATIO: 3
Cholesterol: 177 mg/dL (ref 0–200)
HDL: 68.7 mg/dL (ref >39.00)
LDL Cholesterol: 84 mg/dL (ref 0–99)
NONHDL: 108.66
Triglycerides: 123 mg/dL (ref 0.0–149.0)
VLDL: 24.6 mg/dL (ref 0.0–40.0)

## 2017-03-30 LAB — HEPATIC FUNCTION PANEL
ALK PHOS: 56 U/L (ref 39–117)
ALT: 25 U/L (ref 0–35)
AST: 19 U/L (ref 0–37)
Albumin: 4.4 g/dL (ref 3.5–5.2)
BILIRUBIN DIRECT: 0.1 mg/dL (ref 0.0–0.3)
TOTAL PROTEIN: 6.9 g/dL (ref 6.0–8.3)
Total Bilirubin: 0.8 mg/dL (ref 0.2–1.2)

## 2017-04-01 ENCOUNTER — Ambulatory Visit: Payer: Medicare Other | Admitting: Internal Medicine

## 2017-04-01 DIAGNOSIS — F419 Anxiety disorder, unspecified: Secondary | ICD-10-CM | POA: Diagnosis not present

## 2017-04-01 DIAGNOSIS — E210 Primary hyperparathyroidism: Secondary | ICD-10-CM

## 2017-04-01 DIAGNOSIS — M51369 Other intervertebral disc degeneration, lumbar region without mention of lumbar back pain or lower extremity pain: Secondary | ICD-10-CM

## 2017-04-01 DIAGNOSIS — I83813 Varicose veins of bilateral lower extremities with pain: Secondary | ICD-10-CM

## 2017-04-01 DIAGNOSIS — F329 Major depressive disorder, single episode, unspecified: Secondary | ICD-10-CM | POA: Diagnosis not present

## 2017-04-01 DIAGNOSIS — M5136 Other intervertebral disc degeneration, lumbar region: Secondary | ICD-10-CM

## 2017-04-01 DIAGNOSIS — E7800 Pure hypercholesterolemia, unspecified: Secondary | ICD-10-CM

## 2017-04-01 DIAGNOSIS — D649 Anemia, unspecified: Secondary | ICD-10-CM | POA: Diagnosis not present

## 2017-04-01 DIAGNOSIS — F32A Depression, unspecified: Secondary | ICD-10-CM

## 2017-04-01 DIAGNOSIS — I10 Essential (primary) hypertension: Secondary | ICD-10-CM | POA: Diagnosis not present

## 2017-04-01 NOTE — Progress Notes (Signed)
Patient ID: [name], female   DOB: 10-Apr-1945, 72 y.o.   MRN: [id number]   Subjective:    Patient ID: [name], female    DOB: 1945-07-30, 72 y.o.   MRN: [id number]  HPI  Patient here for a scheduled follow up.  She has been evaluated at [facility] for lower extremity pain and varicose veins.  Recommended compression hose.  Legs are doing better.  Saw Dr Ubaldo Glassing.  Amlodipine was stopped.  No significant abnormalities noted on echo.  Felt stable.  No chest pain.  No sob.  No acid reflux.  No abdominal pain.  Bowels moving.  Persistent foot pain.  Saw Dr Cleda Mccreedy.  Foot wrapped.  Still with issues.  Plans to f/u with podiatry.  Discussed the possibility of orthotics.     Past Medical History:  Diagnosis Date  . Anemia   . Anxiety   . Arthritis   . Burning feet syndrome   . Chronic insomnia   . Complication of anesthesia    "AFFECTS MY MEMORY"  . COPD (chronic obstructive pulmonary disease) ([place])    documented in outside records  . Depression   . Dysrhythmia    PALPITATIONS   . Edema    MILD ANKLE  . GERD (gastroesophageal reflux disease)   . Heart murmur   . History of stomach ulcers   . HOH (hard of hearing)    AIDS  . Hypercholesterolemia   . Hyperparathyroidism, primary ([place])   . Hypertension   . Neuropathy   . Osteoporosis   . Palpitations   . Scoliosis   . Shortness of breath    with exertion  . Tingling    both hands   Past Surgical History:  Procedure Laterality Date  . ANTERIOR CERVICAL DECOMP/DISCECTOMY FUSION  10/28/2011   Procedure: ANTERIOR CERVICAL DECOMPRESSION/DISCECTOMY FUSION 2 LEVELS;  Surgeon: Elaina Hoops, MD;  Location: [facility];  Service: Neurosurgery;  Laterality: N/A;  . BREAST BIOPSY Right 1992   neg  . BREAST SURGERY     RT BREAST CYST REMOVED 15 YRS AGO   . CATARACT EXTRACTION W/PHACO Right 10/30/2014   Procedure: CATARACT EXTRACTION PHACO AND INTRAOCULAR LENS PLACEMENT (IOC);  Surgeon: Birder Robson, MD;  Location: [facility];   Service: Ophthalmology;  Laterality: Right;  US0:40 AP18.2 CDE7.42  [place] #[id number]  . CATARACT EXTRACTION W/PHACO Left 11/27/2014   Procedure: CATARACT EXTRACTION PHACO AND INTRAOCULAR LENS PLACEMENT (IOC);  Surgeon: Birder Robson, MD;  Location: [facility];  Service: Ophthalmology;  Laterality: Left;  [place] 00:36.9 AP%: 18.9 CDE: 6.96  [place] lot #  [id number]  . CYSTOCELE REPAIR N/A 05/13/2015   Procedure: ANTERIOR REPAIR (CYSTOCELE);  Surgeon: Brayton Mars, MD;  Location: [facility];  Service: Gynecology;  Laterality: N/A;  . CYSTOSCOPY  05/13/2015   Procedure: CYSTOSCOPY;  Surgeon: Brayton Mars, MD;  Location: [facility];  Service: Gynecology;;  . DILATION AND CURETTAGE OF UTERUS     X2   . JOINT REPLACEMENT     RT HIP  2013  (PARTIAL)   . PARATHYROIDECTOMY Left 08/25/2012   Procedure: PARATHYROIDECTOMY;  Surgeon: Earnstine Regal, MD;  Location: [facility];  Service: General;  Laterality: Left;  . PITUITARY SURGERY     FOR TUMOR 3 YRS   . TOTAL KNEE ARTHROPLASTY Right 07/18/2014   Procedure: TOTAL KNEE ARTHROPLASTY;  Surgeon: Dereck Leep, MD;  Location: [facility];  Service: Orthopedics;  Laterality: Right;  Marland Kitchen VAGINAL HYSTERECTOMY Bilateral 05/13/2015  Procedure: HYSTERECTOMY VAGINAL WITH BILATERAL SALPINGO OOPHERECTOMY;  Surgeon: Brayton Mars, MD;  Location: [facility];  Service: Gynecology;  Laterality: Bilateral;  . VAGINAL HYSTERECTOMY     bso ant repair   Family History  Problem Relation Age of Onset  . Glaucoma Mother   . Hypertension Mother   . Thyroid disease Mother   . Stroke Father   . Hypertension Father   . Cancer Neg Hx   . Diabetes Neg Hx    Social History   Socioeconomic History  . Marital status: Divorced    Spouse name: None  . Number of children: None  . Years of education: None  . Highest education level: None  Social Needs  . Financial resource strain: None  . Food insecurity - worry: None  . Food insecurity - inability: None  .  Transportation needs - medical: None  . Transportation needs - non-medical: None  Occupational History  . Occupation: retired  Tobacco Use  . Smoking status: Former Smoker    Last attempt to quit: 02/10/2008    Years since quitting: 9.1  . Smokeless tobacco: Never Used  Substance and Sexual Activity  . Alcohol use: Yes    Alcohol/week: 0.0 oz    Comment: rare  . Drug use: No  . Sexual activity: No    Birth control/protection: Post-menopausal  Other Topics Concern  . None  Social History Narrative  . None    Outpatient Encounter Medications as of 04/01/2017  Medication Sig  . acetaminophen (TYLENOL) 500 MG tablet Take 1,000 mg by mouth every 6 (six) hours as needed for fever.   . Alpha-Lipoic Acid 600 MG CAPS Take by mouth.  Marland Kitchen amLODipine (NORVASC) 5 MG tablet Take 1 tablet (5 mg total) by mouth daily.  Marland Kitchen aspirin EC 81 MG tablet Take 81 mg by mouth daily.  Marland Kitchen atorvastatin (LIPITOR) 40 MG tablet TAKE ONE TABLET EVERY DAY  . Biotin (BIOTIN MAXIMUM STRENGTH) 10 MG TABS Take 1 tablet by mouth daily.  . chlorthalidone (HYGROTON) 25 MG tablet Take 1 tablet (25 mg total) by mouth daily.  . chlorthalidone (HYGROTON) 25 MG tablet TAKE ONE TABLET EVERY DAY  . cholecalciferol (VITAMIN D) 1000 UNITS tablet Take 1,000 Units by mouth daily.   Marland Kitchen docusate sodium (COLACE) 100 MG capsule Take 100 mg by mouth 2 (two) times daily as needed for mild constipation.   . ferrous sulfate 325 (65 FE) MG tablet Take 325 mg by mouth daily.  Marland Kitchen gabapentin (NEURONTIN) 300 MG capsule Take 300 mg by mouth at bedtime.  . Glycerin-Polysorbate 80 (REFRESH DRY EYE THERAPY OP) Apply to eye.  . losartan (COZAAR) 100 MG tablet Take 1 tablet (100 mg total) daily by mouth.  . losartan (COZAAR) 100 MG tablet TAKE 1 TABLET BY MOUTH DAILY  . magnesium oxide (MAG-OX) 400 MG tablet Take 400 mg by mouth daily.  . Multiple Vitamins-Minerals (MULTIVITAMIN PO) Take 1 tablet by mouth daily.  . mupirocin ointment (BACTROBAN) 2 % Apply  to affected area on foot bid  . pantoprazole (PROTONIX) 40 MG tablet TAKE 1 TABLET BY MOUTH DAILY  . potassium chloride (K-DUR) 10 MEQ tablet Take 1 tablet (10 mEq total) by mouth daily.  . Probiotic Product (FLORAJEN3 PO) Take 1 tablet by mouth.  . ranitidine (ZANTAC) 150 MG tablet Take 150 mg by mouth 2 (two) times daily.   No facility-administered encounter medications on file as of 04/01/2017.     Review of Systems  Constitutional: Negative for  appetite change and unexpected weight change.  HENT: Negative for congestion and sinus pressure.   Respiratory: Negative for cough, chest tightness and shortness of breath.   Cardiovascular: Negative for chest pain, palpitations and leg swelling.  Gastrointestinal: Negative for abdominal pain, diarrhea and nausea.  Genitourinary: Negative for difficulty urinating and dysuria.  Musculoskeletal: Negative for joint swelling and myalgias.       Foot pain as outlined.    Skin: Negative for color change and rash.  Neurological: Negative for dizziness, light-headedness and headaches.  Psychiatric/Behavioral: Negative for agitation and dysphoric mood.       Objective:    Physical Exam  Constitutional: She appears well-developed and well-nourished. No distress.  HENT:  Nose: Nose normal.  Mouth/Throat: Oropharynx is clear and moist.  Neck: Neck supple. No thyromegaly present.  Cardiovascular: Normal rate and regular rhythm.  Pulmonary/Chest: Breath sounds normal. No respiratory distress. She has no wheezes.  Abdominal: Soft. Bowel sounds are normal. There is no tenderness.  Musculoskeletal: She exhibits no edema or tenderness.  Lymphadenopathy:    She has no cervical adenopathy.  Skin: No rash noted. No erythema.  Psychiatric: She has a normal mood and affect. Her behavior is normal.    BP 132/80 (BP Location: Left Arm, Patient Position: Sitting, Cuff Size: Normal)   Pulse 60   Temp 97.8 F (36.6 C) (Oral)   Resp 18   Wt 132 lb 9.6 oz  (60.1 kg)   SpO2 98%   BMI 21.40 kg/m  Wt Readings from Last 3 Encounters:  04/01/17 132 lb 9.6 oz (60.1 kg)  02/05/17 132 lb 12.8 oz (60.2 kg)  01/11/17 135 lb (61.2 kg)     Lab Results  Component Value Date   WBC 6.9 05/26/2016   HGB 14.2 05/26/2016   HCT 41.9 05/26/2016   PLT 302.0 05/26/2016   GLUCOSE 87 03/30/2017   CHOL 177 03/30/2017   TRIG 123.0 03/30/2017   HDL 68.70 03/30/2017   LDLDIRECT 86.0 05/26/2016   LDLCALC 84 03/30/2017   ALT 25 03/30/2017   AST 19 03/30/2017   NA 142 03/30/2017   K 3.5 03/30/2017   CL 100 03/30/2017   CREATININE 0.63 03/30/2017   BUN 23 03/30/2017   CO2 37 (H) 03/30/2017   TSH 1.55 10/15/2016   INR 0.91 07/05/2014   HGBA1C 5.8 05/26/2016    Mm Screening Breast Tomo Bilateral  Result Date: 09/25/2016 CLINICAL DATA:  Screening. EXAM: 2D DIGITAL SCREENING BILATERAL MAMMOGRAM WITH CAD AND ADJUNCT TOMO COMPARISON:  Previous exam(s). ACR Breast Density Category c: The breast tissue is heterogeneously dense, which may obscure small masses. FINDINGS: There are no findings suspicious for malignancy. Images were processed with CAD. IMPRESSION: No mammographic evidence of malignancy. A result letter of this screening mammogram will be mailed directly to the patient. RECOMMENDATION: Screening mammogram in one year. (Code:SM-B-01Y) BI-RADS CATEGORY  1: Negative. Electronically Signed   By: Lajean Manes M.D.   On: 09/25/2016 11:02       Assessment & Plan:   Problem List Items Addressed This Visit    Anemia    Follow cbc.       Anxiety    Discussed with her today.  Discussed f/u with behavioral health.  She plans to f/u with them.        DDD (degenerative disc disease), lumbar    Has seen Dr Arnoldo Morale.  Had NCS.  Stable.  Follow.       Depression    Planning f/u with  behavioral health.        Hypercholesterolemia    On lipitor.  Low cholesterol diet and exercise.  Follow lipid panel and liver function tests.        Relevant Orders     Hepatic function panel   Lipid panel   Hyperparathyroidism, primary [facility])    S/p surgery - Dr Harlow Asa.  Follow calcium.        Hypertension    Blood pressure as outlined.  Follow pressures.  Same medication regimen.  Follow metabolic panel.        Relevant Orders   CBC with Differential/Platelet   Basic metabolic panel   Varicose veins of both lower extremities with pain    Seeing vascular surgery.  Compression hose.  Swelling resolved.  Stable.           Einar Pheasant, MD

## 2017-04-04 ENCOUNTER — Encounter: Payer: Self-pay | Admitting: Internal Medicine

## 2017-04-04 NOTE — Assessment & Plan Note (Signed)
Planning f/u with behavioral health.

## 2017-04-04 NOTE — Assessment & Plan Note (Signed)
Has seen Dr Arnoldo Morale.  Had NCS.  Stable.  Follow.

## 2017-04-04 NOTE — Assessment & Plan Note (Signed)
Blood pressure as outlined.  Follow pressures.  Same medication regimen.  Follow metabolic panel.  

## 2017-04-04 NOTE — Assessment & Plan Note (Signed)
S/p surgery.  Dr Gerkin.  Follow calcium.  

## 2017-04-04 NOTE — Assessment & Plan Note (Signed)
On lipitor.  Low cholesterol diet and exercise.  Follow lipid panel and liver function tests.   

## 2017-04-04 NOTE — Assessment & Plan Note (Signed)
Seeing vascular surgery.  Compression hose.  Swelling resolved.  Stable.

## 2017-04-04 NOTE — Assessment & Plan Note (Signed)
Discussed with her today.  Discussed f/u with behavioral health.  She plans to f/u with them.

## 2017-04-04 NOTE — Assessment & Plan Note (Signed)
Follow cbc.  

## 2017-04-12 ENCOUNTER — Other Ambulatory Visit: Payer: Self-pay | Admitting: Internal Medicine

## 2017-04-12 ENCOUNTER — Ambulatory Visit (INDEPENDENT_AMBULATORY_CARE_PROVIDER_SITE_OTHER): Payer: Medicare Other | Admitting: Vascular Surgery

## 2017-04-12 ENCOUNTER — Encounter (INDEPENDENT_AMBULATORY_CARE_PROVIDER_SITE_OTHER): Payer: Medicare Other

## 2017-04-19 ENCOUNTER — Ambulatory Visit: Payer: Self-pay | Admitting: *Deleted

## 2017-04-19 NOTE — Telephone Encounter (Signed)
Pt  Has  Some  Swelling   r  Eyelid    And  Above  r  Eye  Brow    Pt  Has  Been  Using a   Battery  Power   Trimmer  In the  Area .  sohe  descibes  The  Swelling  As  A  puffyness . She  Reports  The  Area is  Tender  To  The  Touch . Pt  Denies  Any   Visual  Changes.  No  Availability  At  [place]  tommorow with [employer]. Directions  Given    Reason for Disposition . [1] Swelling is painful to touch AND [2] no fever  Answer Assessment - Initial Assessment Questions 1. APPEARANCE of SWELLING: "What does it look like?" (e.g., lymph node, insect bite, mole)       Swelling   Above   r  Eyelid     2. SIZE: "How large is the swelling?" (inches, cm or compare to coins)       Mild  puffyness   Around   r  eylid  And eye  Brow    3. LOCATION: "Where is the swelling located?"     r   Eye  Lid    4. ONSET: "When did the swelling start?"     1   Week    5. PAIN: "Is it painful?" If so, ask: "How much?"       Mild;y  Irritated    6. ITCH: "Does it itch?" If so, ask: "How much?"     Yes    7. CAUSE: "What do you think caused the swelling?"       Pt has been  using  A  Battery  Powered  Hair   Remover  Last  Last  Just  Prior  To  These   Symptoms   On her  Eye  Brows      8. OTHER SYMPTOMS: "Do you have any other symptoms?" (e.g., fever)      Itching  Mainly  On r  Side  Forehead    Has   Symptoms  On forehead  As   Well    No  Changes  In  Vision  Protocols used: SKIN LUMP OR LOCALIZED SWELLING-A-AH

## 2017-04-20 ENCOUNTER — Ambulatory Visit: Payer: Medicare Other | Admitting: Internal Medicine

## 2017-04-20 ENCOUNTER — Encounter: Payer: Self-pay | Admitting: Internal Medicine

## 2017-04-20 VITALS — BP 128/82 | HR 59 | Temp 97.9°F | Wt 135.0 lb

## 2017-04-20 DIAGNOSIS — B029 Zoster without complications: Secondary | ICD-10-CM | POA: Diagnosis not present

## 2017-04-20 MED ORDER — VALACYCLOVIR HCL 1 G PO TABS
1000.0000 mg | ORAL_TABLET | Freq: Three times a day (TID) | ORAL | 0 refills | Status: DC
Start: 2017-04-20 — End: 2017-07-16

## 2017-04-20 NOTE — Patient Instructions (Signed)

## 2017-04-20 NOTE — Progress Notes (Signed)
Subjective:    Patient ID: [name], female    DOB: 12-21-1945, 72 y.o.   MRN: [id number]  HPI  Pt presents to the clinic today with c/o swelling of her right upper eyelid. This started after she was trimming her eyebrows with an Copy. She reports the area is red, swollen and tender to touch. The area itches and burns. She has not tried anything OTC for this.  Review of Systems  Past Medical History:  Diagnosis Date  . Anemia   . Anxiety   . Arthritis   . Burning feet syndrome   . Chronic insomnia   . Complication of anesthesia    "AFFECTS MY MEMORY"  . COPD (chronic obstructive pulmonary disease) ([place])    documented in outside records  . Depression   . Dysrhythmia    PALPITATIONS   . Edema    MILD ANKLE  . GERD (gastroesophageal reflux disease)   . Heart murmur   . History of stomach ulcers   . HOH (hard of hearing)    AIDS  . Hypercholesterolemia   . Hyperparathyroidism, primary ([place])   . Hypertension   . Neuropathy   . Osteoporosis   . Palpitations   . Scoliosis   . Shortness of breath    with exertion  . Tingling    both hands    Current Outpatient Medications  Medication Sig Dispense Refill  . acetaminophen (TYLENOL) 500 MG tablet Take 1,000 mg by mouth every 6 (six) hours as needed for fever.     . Alpha-Lipoic Acid 600 MG CAPS Take by mouth.    Marland Kitchen amLODipine (NORVASC) 5 MG tablet Take 1 tablet (5 mg total) by mouth daily. 90 tablet 1  . aspirin EC 81 MG tablet Take 81 mg by mouth daily.    Marland Kitchen atorvastatin (LIPITOR) 40 MG tablet TAKE ONE TABLET EVERY DAY 90 tablet 1  . Biotin (BIOTIN MAXIMUM STRENGTH) 10 MG TABS Take 1 tablet by mouth daily.    . chlorthalidone (HYGROTON) 25 MG tablet Take 1 tablet (25 mg total) by mouth daily. 90 tablet 1  . chlorthalidone (HYGROTON) 25 MG tablet TAKE 1 TABLET BY MOUTH EVERY DAY 90 tablet 1  . cholecalciferol (VITAMIN D) 1000 UNITS tablet Take 1,000 Units by mouth daily.     Marland Kitchen docusate sodium (COLACE)  100 MG capsule Take 100 mg by mouth 2 (two) times daily as needed for mild constipation.     . ferrous sulfate 325 (65 FE) MG tablet Take 325 mg by mouth daily.    Marland Kitchen gabapentin (NEURONTIN) 300 MG capsule Take 300 mg by mouth at bedtime.    . Glycerin-Polysorbate 80 (REFRESH DRY EYE THERAPY OP) Apply to eye.    . losartan (COZAAR) 100 MG tablet TAKE 1 TABLET BY MOUTH DAILY 90 tablet 0  . magnesium oxide (MAG-OX) 400 MG tablet Take 400 mg by mouth daily.    . Multiple Vitamins-Minerals (MULTIVITAMIN PO) Take 1 tablet by mouth daily.    . mupirocin ointment (BACTROBAN) 2 % Apply to affected area on foot bid 22 g 0  . pantoprazole (PROTONIX) 40 MG tablet TAKE 1 TABLET BY MOUTH DAILY 60 tablet 3  . potassium chloride (K-DUR) 10 MEQ tablet Take 1 tablet (10 mEq total) by mouth daily. 90 tablet 1  . Probiotic Product (FLORAJEN3 PO) Take 1 tablet by mouth.    . ranitidine (ZANTAC) 150 MG tablet Take 150 mg by mouth 2 (two) times daily.  No current facility-administered medications for this visit.     Allergies  Allergen Reactions  . Sulfamethoxazole-Trimethoprim Other (See Comments)    GI Upset  . Citalopram Hives  . Tape Itching    Family History  Problem Relation Age of Onset  . Glaucoma Mother   . Hypertension Mother   . Thyroid disease Mother   . Stroke Father   . Hypertension Father   . Cancer Neg Hx   . Diabetes Neg Hx     Social History   Socioeconomic History  . Marital status: Divorced    Spouse name: Not on file  . Number of children: Not on file  . Years of education: Not on file  . Highest education level: Not on file  Social Needs  . Financial resource strain: Not on file  . Food insecurity - worry: Not on file  . Food insecurity - inability: Not on file  . Transportation needs - medical: Not on file  . Transportation needs - non-medical: Not on file  Occupational History  . Occupation: retired  Tobacco Use  . Smoking status: Former Smoker    Last attempt  to quit: 02/10/2008    Years since quitting: 9.1  . Smokeless tobacco: Never Used  Substance and Sexual Activity  . Alcohol use: Yes    Alcohol/week: 0.0 oz    Comment: rare  . Drug use: No  . Sexual activity: No    Birth control/protection: Post-menopausal  Other Topics Concern  . Not on file  Social History Narrative  . Not on file     Constitutional: Denies fever, malaise, fatigue, headache or abrupt weight changes.  Skin: Pt reports redness, swelling above right eye. Denies rashes, lesions or ulcercations.    No other specific complaints in a complete review of systems (except as listed in HPI above).     Objective:   Physical Exam   BP 128/82   Pulse (!) 59   Temp 97.9 F (36.6 C) (Oral)   Wt 135 lb (61.2 kg)   SpO2 97%   BMI 21.79 kg/m  Wt Readings from Last 3 Encounters:  04/20/17 135 lb (61.2 kg)  04/01/17 132 lb 9.6 oz (60.1 kg)  02/05/17 132 lb 12.8 oz (60.2 kg)    General: Appears their stated age, well developed, well nourished in NAD. Skin: Scattered vesicular lesion on erythematous base noted in right parietal scalp extending above right eye and eye lid.  BMET    Component Value Date/Time   NA 142 03/30/2017 0952   NA 140 12/29/2012 1120   K 3.5 03/30/2017 0952   K 3.4 (L) 12/29/2012 1120   CL 100 03/30/2017 0952   CL 107 12/29/2012 1120   CO2 37 (H) 03/30/2017 0952   CO2 27 12/29/2012 1120   GLUCOSE 87 03/30/2017 0952   GLUCOSE 117 (H) 12/29/2012 1120   BUN 23 03/30/2017 0952   BUN 25 (H) 12/29/2012 1120   CREATININE 0.63 03/30/2017 0952   CREATININE 0.96 12/29/2012 1120   CALCIUM 10.3 03/30/2017 0952   CALCIUM 8.8 12/29/2012 1120   CALCIUM 10.6 (H) 06/24/2012 1437   GFRNONAA >60 07/19/2014 0530   GFRNONAA >60 12/29/2012 1120   GFRAA >60 07/19/2014 0530   GFRAA >60 12/29/2012 1120    Lipid Panel     Component Value Date/Time   CHOL 177 03/30/2017 0952   TRIG 123.0 03/30/2017 0952   HDL 68.70 03/30/2017 0952   CHOLHDL 3  03/30/2017 0952   VLDL 24.6  03/30/2017 0952   LDLCALC 84 03/30/2017 0952    CBC    Component Value Date/Time   WBC 6.9 05/26/2016 0959   RBC 4.27 05/26/2016 0959   HGB 14.2 05/26/2016 0959   HGB 11.1 (L) 12/29/2012 1120   HCT 41.9 05/26/2016 0959   HCT 32.4 (L) 12/29/2012 1120   PLT 302.0 05/26/2016 0959   PLT 376 12/29/2012 1120   MCV 98.2 05/26/2016 0959   MCV 96 12/29/2012 1120   MCH 32.8 05/08/2015 1132   MCHC 33.9 05/26/2016 0959   RDW 13.1 05/26/2016 0959   RDW 14.2 12/29/2012 1120   LYMPHSABS 1.8 05/26/2016 0959   LYMPHSABS 2.4 12/29/2012 1120   MONOABS 0.8 05/26/2016 0959   MONOABS 1.1 (H) 12/29/2012 1120   EOSABS 0.2 05/26/2016 0959   EOSABS 0.2 12/29/2012 1120   BASOSABS 0.1 05/26/2016 0959   BASOSABS 0.2 (H) 12/29/2012 1120    Hgb A1C Lab Results  Component Value Date   HGBA1C 5.8 05/26/2016           Assessment & Plan:   Shingles:  eRx for Valtrex 1 gm TID x 7 days Advised her to make an appt with her opthalomologist just to make sure there is no eye involvement  Return precautions discussed Webb Silversmith, NP

## 2017-04-21 DIAGNOSIS — E7800 Pure hypercholesterolemia, unspecified: Secondary | ICD-10-CM | POA: Diagnosis not present

## 2017-04-21 DIAGNOSIS — I10 Essential (primary) hypertension: Secondary | ICD-10-CM | POA: Diagnosis not present

## 2017-04-21 DIAGNOSIS — R001 Bradycardia, unspecified: Secondary | ICD-10-CM | POA: Diagnosis not present

## 2017-04-21 DIAGNOSIS — B0239 Other herpes zoster eye disease: Secondary | ICD-10-CM | POA: Diagnosis not present

## 2017-04-28 DIAGNOSIS — B0239 Other herpes zoster eye disease: Secondary | ICD-10-CM | POA: Diagnosis not present

## 2017-05-13 ENCOUNTER — Ambulatory Visit (INDEPENDENT_AMBULATORY_CARE_PROVIDER_SITE_OTHER): Payer: Medicare Other | Admitting: Vascular Surgery

## 2017-05-13 ENCOUNTER — Ambulatory Visit (INDEPENDENT_AMBULATORY_CARE_PROVIDER_SITE_OTHER): Payer: Medicare Other

## 2017-05-13 ENCOUNTER — Encounter (INDEPENDENT_AMBULATORY_CARE_PROVIDER_SITE_OTHER): Payer: Self-pay

## 2017-05-13 DIAGNOSIS — I83813 Varicose veins of bilateral lower extremities with pain: Secondary | ICD-10-CM

## 2017-05-17 ENCOUNTER — Telehealth (INDEPENDENT_AMBULATORY_CARE_PROVIDER_SITE_OTHER): Payer: Self-pay | Admitting: Vascular Surgery

## 2017-05-17 NOTE — Telephone Encounter (Signed)
I contacted the patient by phone  I reviewed her reflux venous study bilateral lower extremities.  Data shows essentially a normal deep venous system with a small amount of reflux in the common femoral.  On the right there is reflux within the great saphenous vein.  On the left there is reflux at the saphenofemoral junction only.  I have reviewed venous insufficiency and painful varicose veins.  The patient has been wearing compression she is even tried pantyhose but has not found them to have a tremendous benefit.  I have reviewed the risks and benefits for laser ablation.  She wishes to proceed with a right laser ablation of the great saphenous vein.  The risks and benefits were reviewed all of her questions were answered I will work toward approval with her insurance company for right great saphenous vein laser ablation.

## 2017-06-01 ENCOUNTER — Other Ambulatory Visit: Payer: Self-pay | Admitting: Internal Medicine

## 2017-06-17 ENCOUNTER — Other Ambulatory Visit: Payer: Self-pay | Admitting: Internal Medicine

## 2017-06-26 ENCOUNTER — Other Ambulatory Visit: Payer: Self-pay | Admitting: Internal Medicine

## 2017-06-28 ENCOUNTER — Other Ambulatory Visit: Payer: Self-pay

## 2017-06-28 MED ORDER — ATORVASTATIN CALCIUM 40 MG PO TABS: 40.0000 mg | ORAL_TABLET | Freq: Every day | ORAL | 1 refills | Status: DC

## 2017-06-28 MED ORDER — POTASSIUM CHLORIDE ER 10 MEQ PO TBCR: 10.0000 meq | EXTENDED_RELEASE_TABLET | Freq: Every day | ORAL | 1 refills | Status: DC

## 2017-07-01 DIAGNOSIS — F419 Anxiety disorder, unspecified: Secondary | ICD-10-CM | POA: Diagnosis not present

## 2017-07-01 DIAGNOSIS — I10 Essential (primary) hypertension: Secondary | ICD-10-CM | POA: Diagnosis not present

## 2017-07-01 DIAGNOSIS — E7800 Pure hypercholesterolemia, unspecified: Secondary | ICD-10-CM | POA: Diagnosis not present

## 2017-07-14 ENCOUNTER — Other Ambulatory Visit: Payer: Self-pay

## 2017-07-14 ENCOUNTER — Ambulatory Visit: Payer: No Typology Code available for payment source | Admitting: Psychiatry

## 2017-07-14 ENCOUNTER — Encounter: Payer: Self-pay | Admitting: Psychiatry

## 2017-07-14 VITALS — BP 164/90 | HR 56 | Temp 97.6°F | Wt 132.0 lb

## 2017-07-14 DIAGNOSIS — F4310 Post-traumatic stress disorder, unspecified: Secondary | ICD-10-CM

## 2017-07-14 DIAGNOSIS — F411 Generalized anxiety disorder: Secondary | ICD-10-CM | POA: Diagnosis not present

## 2017-07-14 DIAGNOSIS — F331 Major depressive disorder, recurrent, moderate: Secondary | ICD-10-CM | POA: Diagnosis not present

## 2017-07-14 DIAGNOSIS — M810 Age-related osteoporosis without current pathological fracture: Secondary | ICD-10-CM | POA: Insufficient documentation

## 2017-07-14 DIAGNOSIS — R202 Paresthesia of skin: Secondary | ICD-10-CM

## 2017-07-14 DIAGNOSIS — R200 Anesthesia of skin: Secondary | ICD-10-CM | POA: Insufficient documentation
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
## 2017-07-15 ENCOUNTER — Encounter: Payer: Self-pay | Admitting: Psychiatry

## 2017-07-16 ENCOUNTER — Ambulatory Visit (INDEPENDENT_AMBULATORY_CARE_PROVIDER_SITE_OTHER): Payer: Medicare Other

## 2017-07-16 ENCOUNTER — Ambulatory Visit: Payer: Medicare Other | Admitting: Podiatry

## 2017-07-16 ENCOUNTER — Other Ambulatory Visit: Payer: Self-pay | Admitting: Podiatry

## 2017-07-16 ENCOUNTER — Encounter: Payer: Self-pay | Admitting: Podiatry

## 2017-07-16 DIAGNOSIS — M7752 Other enthesopathy of left foot: Secondary | ICD-10-CM

## 2017-07-16 DIAGNOSIS — M7751 Other enthesopathy of right foot: Secondary | ICD-10-CM

## 2017-07-16 DIAGNOSIS — M779 Enthesopathy, unspecified: Secondary | ICD-10-CM

## 2017-07-16 DIAGNOSIS — M76822 Posterior tibial tendinitis, left leg: Secondary | ICD-10-CM | POA: Diagnosis not present

## 2017-07-16 DIAGNOSIS — M722 Plantar fascial fibromatosis: Secondary | ICD-10-CM

## 2017-07-19 NOTE — Progress Notes (Signed)
   HPI: 72 year old female presenting today as a new patient with a chief complaint of burning, stinging pain of bilateral feet that began about two years ago. She states it feels as if she is walking on rocks. She reports neuropathy of the feet. Walking and standing increase her pain. She has been taking gabapentin and Tylenol for her symptoms. Patient is here for further evaluation and treatment.   Past Medical History:  Diagnosis Date  . Anemia   . Anxiety   . Arthritis   . Burning feet syndrome   . Chronic insomnia   . Complication of anesthesia    "AFFECTS MY MEMORY"  . COPD (chronic obstructive pulmonary disease) ([place])    documented in outside records  . Depression   . Dysrhythmia    PALPITATIONS   . Edema    MILD ANKLE  . GERD (gastroesophageal reflux disease)   . Heart murmur   . History of stomach ulcers   . HOH (hard of hearing)    AIDS  . Hypercholesterolemia   . Hyperparathyroidism, primary ([place])   . Hypertension   . Neuropathy   . Osteoporosis   . Palpitations   . Scoliosis   . Shortness of breath    with exertion  . Tingling    both hands     Physical Exam: General: The patient is alert and oriented x3 in no acute distress.  Dermatology: Skin is warm, dry and supple bilateral lower extremities. Negative for open lesions or macerations.  Vascular: Palpable pedal pulses bilaterally. No edema or erythema noted. Capillary refill within normal limits.  Neurological: Epicritic and protective threshold diminished bilaterally.   Musculoskeletal Exam: Medial longitudinal arch collapse noted with a semirigid deformity.  There is some pain on palpation along the medial longitudinal arch and along the navicular tuberosity.  Range of motion within normal limits to all pedal and ankle joints bilateral. Muscle strength 5/5 in all groups bilateral.   Radiographic Exam:  Normal osseous mineralization. Joint spaces preserved. No fracture/dislocation/boney destruction.      Assessment: 1. PTTD left  2. Peripheral neuropathy BLE   Plan of Care:  1. Patient evaluated. X-Rays reviewed.  2. Appointment with Liliane Channel for custom brace to support PTTD left.  3. Continue taking gabapentin from PCP.  4. Return to clinic as needed.       Edrick Kins, DPM [facility]  Dr. Edrick Kins, DPM    [address], [place] [address]                Office [phone]  Fax [phone]

## 2017-07-21 ENCOUNTER — Other Ambulatory Visit: Payer: Medicare Other | Admitting: Orthotics

## 2017-07-22 ENCOUNTER — Telehealth (INDEPENDENT_AMBULATORY_CARE_PROVIDER_SITE_OTHER): Payer: Self-pay

## 2017-07-22 ENCOUNTER — Other Ambulatory Visit (INDEPENDENT_AMBULATORY_CARE_PROVIDER_SITE_OTHER): Payer: Medicare Other | Admitting: Vascular Surgery

## 2017-07-22 NOTE — Telephone Encounter (Addendum)
Patient called and stated that she was scheduled for an ultrasound on the 13th, but it was then moved to the 17th of this month and she does not think that she needs to keep this appointment and she would like to know if it is necessary for her to keep it?   This would be the follow up to her Venous reflux that she had on her last visit.

## 2017-07-23 NOTE — Telephone Encounter (Signed)
The patient has canceled the appointment for the 17th. And does not want to reschedule right now.

## 2017-07-23 NOTE — Telephone Encounter (Signed)
Please refer to Dr. Nino Parsley 4/84/19 note.

## 2017-07-26 ENCOUNTER — Encounter (INDEPENDENT_AMBULATORY_CARE_PROVIDER_SITE_OTHER): Payer: Medicare Other

## 2017-07-30 ENCOUNTER — Other Ambulatory Visit (INDEPENDENT_AMBULATORY_CARE_PROVIDER_SITE_OTHER): Payer: Medicare Other

## 2017-07-30 DIAGNOSIS — I10 Essential (primary) hypertension: Secondary | ICD-10-CM | POA: Diagnosis not present

## 2017-07-30 DIAGNOSIS — E7800 Pure hypercholesterolemia, unspecified: Secondary | ICD-10-CM

## 2017-07-30 LAB — CBC WITH DIFFERENTIAL/PLATELET
BASOS ABS: 0.1 10*3/uL (ref 0.0–0.1)
BASOS PCT: 1.3 % (ref 0.0–3.0)
Eosinophils Absolute: 0.2 10*3/uL (ref 0.0–0.7)
Eosinophils Relative: 2.7 % (ref 0.0–5.0)
HCT: 41.9 % (ref 36.0–46.0)
Hemoglobin: 14.2 g/dL (ref 12.0–15.0)
LYMPHS ABS: 2.1 10*3/uL (ref 0.7–4.0)
Lymphocytes Relative: 32.9 % (ref 12.0–46.0)
MCHC: 33.8 g/dL (ref 30.0–36.0)
MCV: 99.8 fl (ref 78.0–100.0)
MONOS PCT: 11.8 % (ref 3.0–12.0)
Monocytes Absolute: 0.7 10*3/uL (ref 0.1–1.0)
NEUTROS ABS: 3.2 10*3/uL (ref 1.4–7.7)
NEUTROS PCT: 51.3 % (ref 43.0–77.0)
PLATELETS: 255 10*3/uL (ref 150.0–400.0)
RBC: 4.2 Mil/uL (ref 3.87–5.11)
RDW: 13.2 % (ref 11.5–15.5)
WBC: 6.2 10*3/uL (ref 4.0–10.5)

## 2017-07-30 LAB — HEPATIC FUNCTION PANEL
ALK PHOS: 54 U/L (ref 39–117)
ALT: 22 U/L (ref 0–35)
AST: 16 U/L (ref 0–37)
Albumin: 4.6 g/dL (ref 3.5–5.2)
BILIRUBIN DIRECT: 0.2 mg/dL (ref 0.0–0.3)
TOTAL PROTEIN: 7 g/dL (ref 6.0–8.3)
Total Bilirubin: 0.9 mg/dL (ref 0.2–1.2)

## 2017-07-30 LAB — LIPID PANEL
CHOL/HDL RATIO: 3
Cholesterol: 199 mg/dL (ref 0–200)
HDL: 79.4 mg/dL (ref >39.00)
LDL Cholesterol: 98 mg/dL (ref 0–99)
NonHDL: 119.1
TRIGLYCERIDES: 108 mg/dL (ref 0.0–149.0)
VLDL: 21.6 mg/dL (ref 0.0–40.0)

## 2017-07-30 LAB — BASIC METABOLIC PANEL
BUN: 18 mg/dL (ref 6–23)
CALCIUM: 10.2 mg/dL (ref 8.4–10.5)
CO2: 35 meq/L — AB (ref 19–32)
CREATININE: 0.63 mg/dL (ref 0.40–1.20)
Chloride: 102 mEq/L (ref 96–112)
GFR: 99.19 mL/min (ref >60.00)
Glucose, Bld: 93 mg/dL (ref 70–99)
Potassium: 3.8 mEq/L (ref 3.5–5.1)
Sodium: 143 mEq/L (ref 135–145)

## 2017-08-02 ENCOUNTER — Other Ambulatory Visit: Payer: Self-pay | Admitting: Internal Medicine

## 2017-08-03 ENCOUNTER — Ambulatory Visit: Payer: Medicare Other | Admitting: Internal Medicine

## 2017-08-03 DIAGNOSIS — G629 Polyneuropathy, unspecified: Secondary | ICD-10-CM | POA: Diagnosis not present

## 2017-08-03 DIAGNOSIS — E7800 Pure hypercholesterolemia, unspecified: Secondary | ICD-10-CM | POA: Diagnosis not present

## 2017-08-03 DIAGNOSIS — F419 Anxiety disorder, unspecified: Secondary | ICD-10-CM | POA: Diagnosis not present

## 2017-08-03 DIAGNOSIS — I10 Essential (primary) hypertension: Secondary | ICD-10-CM | POA: Diagnosis not present

## 2017-08-03 DIAGNOSIS — E210 Primary hyperparathyroidism: Secondary | ICD-10-CM

## 2017-08-03 DIAGNOSIS — D649 Anemia, unspecified: Secondary | ICD-10-CM | POA: Diagnosis not present

## 2017-08-03 DIAGNOSIS — F329 Major depressive disorder, single episode, unspecified: Secondary | ICD-10-CM | POA: Diagnosis not present

## 2017-08-03 DIAGNOSIS — F32A Depression, unspecified: Secondary | ICD-10-CM

## 2017-08-03 NOTE — Progress Notes (Signed)
Patient ID: [name], female   DOB: 1945/05/01, 72 y.o.   MRN: [id number]   Subjective:    Patient ID: [name], female    DOB: 08-08-1945, 72 y.o.   MRN: [id number]  HPI  Patient here for a scheduled follow up.  Just evaluated by cardiology in 06/2017.  Felt stable.  Previous echo revealed no significant abnormalities.  Recommended f/u in one year.  She recently saw psychiatry.  Has a history of depression and generalized anxiety with PTSD.  Note reviewed.  Not interested in medication.  Referred for counseling with Miguel Dibble.  She tries to stay active.  No chest pain.  Breathing stable.  No acid reflux.  No abdominal pain.  Bowels moving.  Seeing podiatry for her foot.  Discussed labs.     Past Medical History:  Diagnosis Date  . Anemia   . Anxiety   . Arthritis   . Burning feet syndrome   . Chronic insomnia   . Complication of anesthesia    "AFFECTS MY MEMORY"  . COPD (chronic obstructive pulmonary disease) ([place])    documented in outside records  . Depression   . Dysrhythmia    PALPITATIONS   . Edema    MILD ANKLE  . GERD (gastroesophageal reflux disease)   . Heart murmur   . History of stomach ulcers   . HOH (hard of hearing)    AIDS  . Hypercholesterolemia   . Hyperparathyroidism, primary ([place])   . Hypertension   . Neuropathy   . Osteoporosis   . Palpitations   . Scoliosis   . Shortness of breath    with exertion  . Tingling    both hands   Past Surgical History:  Procedure Laterality Date  . ANTERIOR CERVICAL DECOMP/DISCECTOMY FUSION  10/28/2011   Procedure: ANTERIOR CERVICAL DECOMPRESSION/DISCECTOMY FUSION 2 LEVELS;  Surgeon: Elaina Hoops, MD;  Location: [facility];  Service: Neurosurgery;  Laterality: N/A;  . BREAST BIOPSY Right 1992   neg  . BREAST SURGERY     RT BREAST CYST REMOVED 15 YRS AGO   . CATARACT EXTRACTION W/PHACO Right 10/30/2014   Procedure: CATARACT EXTRACTION PHACO AND INTRAOCULAR LENS PLACEMENT (IOC);  Surgeon: Birder Robson, MD;  Location: [facility] ORS;  Service: Ophthalmology;  Laterality: Right;  US0:40 AP18.2 CDE7.42  [place] #[id number]  . CATARACT EXTRACTION W/PHACO Left 11/27/2014   Procedure: CATARACT EXTRACTION PHACO AND INTRAOCULAR LENS PLACEMENT (IOC);  Surgeon: Birder Robson, MD;  Location: [facility] ORS;  Service: Ophthalmology;  Laterality: Left;  [place] 00:36.9 AP%: 18.9 CDE: 6.96  [place] #  [id number]  . CYSTOCELE REPAIR N/A 05/13/2015   Procedure: ANTERIOR REPAIR (CYSTOCELE);  Surgeon: Brayton Mars, MD;  Location: [facility] ORS;  Service: Gynecology;  Laterality: N/A;  . CYSTOSCOPY  05/13/2015   Procedure: CYSTOSCOPY;  Surgeon: Brayton Mars, MD;  Location: [facility] ORS;  Service: Gynecology;;  . DILATION AND CURETTAGE OF UTERUS     X2   . JOINT REPLACEMENT     RT HIP  2013  (PARTIAL)   . PARATHYROIDECTOMY Left 08/25/2012   Procedure: PARATHYROIDECTOMY;  Surgeon: Earnstine Regal, MD;  Location: [facility];  Service: General;  Laterality: Left;  . PITUITARY SURGERY     FOR TUMOR 3 YRS   . TOTAL KNEE ARTHROPLASTY Right 07/18/2014   Procedure: TOTAL KNEE ARTHROPLASTY;  Surgeon: Dereck Leep, MD;  Location: [facility] ORS;  Service: Orthopedics;  Laterality: Right;  Marland Kitchen VAGINAL HYSTERECTOMY Bilateral 05/13/2015  Procedure: HYSTERECTOMY VAGINAL WITH BILATERAL SALPINGO OOPHERECTOMY;  Surgeon: Brayton Mars, MD;  Location: [facility] ORS;  Service: Gynecology;  Laterality: Bilateral;  . VAGINAL HYSTERECTOMY     bso ant repair   Family History  Problem Relation Age of Onset  . Glaucoma Mother   . Hypertension Mother   . Thyroid disease Mother   . Stroke Father   . Hypertension Father   . Bipolar disorder Sister   . Cancer Neg Hx   . Diabetes Neg Hx    Social History   Socioeconomic History  . Marital status: Divorced    Spouse name: Not on file  . Number of children: 1  . Years of education: Not on file  . Highest education level: Some college, no degree  Occupational History  .  Occupation: retired  Scientific laboratory technician  . Financial resource strain: Hard  . Food insecurity:    Worry: Often true    Inability: Often true  . Transportation needs:    Medical: No    Non-medical: No  Tobacco Use  . Smoking status: Former Smoker    Last attempt to quit: 02/10/2008    Years since quitting: 9.4  . Smokeless tobacco: Never Used  Substance and Sexual Activity  . Alcohol use: Yes    Alcohol/week: 0.0 oz    Comment: rare  . Drug use: No  . Sexual activity: Never    Birth control/protection: Post-menopausal  Lifestyle  . Physical activity:    Days per week: 0 days    Minutes per session: 0 min  . Stress: Rather much  Relationships  . Social connections:    Talks on phone: Not on file    Gets together: Not on file    Attends religious service: Never    Active member of club or organization: No    Attends meetings of clubs or organizations: Never    Relationship status: Divorced  Other Topics Concern  . Not on file  Social History Narrative  . Not on file    Outpatient Encounter Medications as of 08/03/2017  Medication Sig  . acetaminophen (TYLENOL) 500 MG tablet Take 1,000 mg by mouth every 6 (six) hours as needed for fever.   . Alpha-Lipoic Acid 600 MG CAPS Take by mouth.  Marland Kitchen amLODipine (NORVASC) 5 MG tablet Take 1 tablet (5 mg total) by mouth daily.  Marland Kitchen aspirin EC 81 MG tablet Take 81 mg by mouth daily.  Marland Kitchen atorvastatin (LIPITOR) 40 MG tablet Take 1 tablet (40 mg total) by mouth daily.  . Biotin (BIOTIN MAXIMUM STRENGTH) 10 MG TABS Take 1 tablet by mouth daily.  . chlorthalidone (HYGROTON) 25 MG tablet TAKE 1 TABLET BY MOUTH EVERY DAY  . cholecalciferol (VITAMIN D) 1000 UNITS tablet Take 1,000 Units by mouth daily.   Marland Kitchen docusate sodium (COLACE) 100 MG capsule Take 100 mg by mouth 2 (two) times daily as needed for mild constipation.   . ferrous sulfate 325 (65 FE) MG tablet Take 325 mg by mouth daily.  Marland Kitchen gabapentin (NEURONTIN) 300 MG capsule Take 300 mg by mouth at  bedtime.  . Glycerin-Polysorbate 80 (REFRESH DRY EYE THERAPY OP) Apply to eye.  . losartan (COZAAR) 100 MG tablet TAKE ONE TABLET EVERY DAY  . magnesium oxide (MAG-OX) 400 MG tablet Take 400 mg by mouth daily.  . Multiple Vitamins-Minerals (MULTIVITAMIN PO) Take 1 tablet by mouth daily.  . pantoprazole (PROTONIX) 40 MG tablet TAKE ONE TABLET BY MOUTH EVERY DAY  . potassium chloride (  K-DUR) 10 MEQ tablet Take 1 tablet (10 mEq total) by mouth daily.  . Probiotic Product (FLORAJEN3 PO) Take 1 tablet by mouth.  . ranitidine (ZANTAC) 150 MG tablet Take 150 mg by mouth 2 (two) times daily.   No facility-administered encounter medications on file as of 08/03/2017.     Review of Systems  Constitutional: Negative for appetite change and unexpected weight change.  HENT: Negative for congestion and sinus pressure.   Respiratory: Negative for cough, chest tightness and shortness of breath.   Cardiovascular: Negative for chest pain, palpitations and leg swelling.  Gastrointestinal: Negative for abdominal pain, diarrhea and nausea.  Genitourinary: Negative for difficulty urinating and dysuria.  Musculoskeletal: Negative for joint swelling and myalgias.  Skin: Negative for color change and rash.  Neurological: Negative for dizziness, light-headedness and headaches.  Psychiatric/Behavioral:       Increased stress, anxiety and depression as outlined.         Objective:    Physical Exam  Constitutional: She appears well-developed and well-nourished. No distress.  HENT:  Nose: Nose normal.  Mouth/Throat: Oropharynx is clear and moist.  Neck: Neck supple. No thyromegaly present.  Cardiovascular: Normal rate and regular rhythm.  Pulmonary/Chest: Breath sounds normal. No respiratory distress. She has no wheezes.  Abdominal: Soft. Bowel sounds are normal. There is no tenderness.  Musculoskeletal: She exhibits no edema or tenderness.  Lymphadenopathy:    She has no cervical adenopathy.  Skin: No  rash noted. No erythema.  Psychiatric: She has a normal mood and affect. Her behavior is normal.    BP 132/80 (BP Location: Left Arm, Patient Position: Sitting, Cuff Size: Normal)   Pulse 68   Temp 98 F (36.7 C) (Oral)   Resp 18   Wt 133 lb 9.6 oz (60.6 kg)   SpO2 98%   BMI 21.56 kg/m  Wt Readings from Last 3 Encounters:  08/03/17 133 lb 9.6 oz (60.6 kg)  07/14/17 132 lb (59.9 kg)  04/20/17 135 lb (61.2 kg)     Lab Results  Component Value Date   WBC 6.2 07/30/2017   HGB 14.2 07/30/2017   HCT 41.9 07/30/2017   PLT 255.0 07/30/2017   GLUCOSE 93 07/30/2017   CHOL 199 07/30/2017   TRIG 108.0 07/30/2017   HDL 79.40 07/30/2017   LDLDIRECT 86.0 05/26/2016   LDLCALC 98 07/30/2017   ALT 22 07/30/2017   AST 16 07/30/2017   NA 143 07/30/2017   K 3.8 07/30/2017   CL 102 07/30/2017   CREATININE 0.63 07/30/2017   BUN 18 07/30/2017   CO2 35 (H) 07/30/2017   TSH 1.55 10/15/2016   INR 0.91 07/05/2014   HGBA1C 5.8 05/26/2016    Mm Screening Breast Tomo Bilateral  Result Date: 09/25/2016 CLINICAL DATA:  Screening. EXAM: 2D DIGITAL SCREENING BILATERAL MAMMOGRAM WITH CAD AND ADJUNCT TOMO COMPARISON:  Previous exam(s). ACR Breast Density Category c: The breast tissue is heterogeneously dense, which may obscure small masses. FINDINGS: There are no findings suspicious for malignancy. Images were processed with CAD. IMPRESSION: No mammographic evidence of malignancy. A result letter of this screening mammogram will be mailed directly to the patient. RECOMMENDATION: Screening mammogram in one year. (Code:[id number]) BI-RADS CATEGORY  1: Negative. Electronically Signed   By: Lajean Manes M.D.   On: 09/25/2016 11:02       Assessment & Plan:   Problem List Items Addressed This Visit    Anemia    Follow cbc.        Anxiety  Seeing psychiatry.  Being referred for counseling.  Follow.  Desires no medication.       Depression    Seeing psychiatry.  See note.  Desires no medication.   Being referred for counseling.        Hypercholesterolemia    On lipitor.  Low cholesterol diet and exercise.  Follow lipid panel and liver function tests.        Relevant Orders   Hepatic function panel   Lipid panel   Hyperparathyroidism, primary [facility])    S/p surgery - Dr Harlow Asa.  Follow calcium.        Hypertension    Blood pressure under good control.  Continue same medication regimen.  Follow pressures.  Follow metabolic panel.        Relevant Orders   TSH   Basic metabolic panel   Neuropathy    On alpha lipoic acid and gabapentin.  Follow.            Einar Pheasant, MD

## 2017-08-04 ENCOUNTER — Ambulatory Visit: Payer: Medicare Other | Admitting: Orthotics

## 2017-08-04 DIAGNOSIS — R200 Anesthesia of skin: Secondary | ICD-10-CM

## 2017-08-04 DIAGNOSIS — M76822 Posterior tibial tendinitis, left leg: Secondary | ICD-10-CM

## 2017-08-04 DIAGNOSIS — R202 Paresthesia of skin: Secondary | ICD-10-CM

## 2017-08-04 NOTE — Progress Notes (Signed)
Patient presents today for evaluation/casting for AFO brace (L).   Patient has hx of the following conditions: Gait instability,  Ankle instabilty,  Gait analysis done and patient displays abnormality of gait in both sagittial and frontal planes, and could benefit in aggressive ankle support.  Patient chose [place] brace w/ lace/speed laces.   Top velcro, bottom lace.Marland Kitchen9.5

## 2017-08-07 ENCOUNTER — Encounter: Payer: Self-pay | Admitting: Internal Medicine

## 2017-08-07 NOTE — Assessment & Plan Note (Signed)
Seeing psychiatry.  See note.  Desires no medication.  Being referred for counseling.

## 2017-08-07 NOTE — Assessment & Plan Note (Signed)
On alpha lipoic acid and gabapentin.  Follow.

## 2017-08-07 NOTE — Assessment & Plan Note (Signed)
S/p surgery.  Dr Gerkin.  Follow calcium.  

## 2017-08-07 NOTE — Assessment & Plan Note (Signed)
Blood pressure under good control.  Continue same medication regimen.  Follow pressures.  Follow metabolic panel.   

## 2017-08-07 NOTE — Assessment & Plan Note (Addendum)
Seeing psychiatry.  Being referred for counseling.  Follow.  Desires no medication.

## 2017-08-07 NOTE — Assessment & Plan Note (Signed)
On lipitor.  Low cholesterol diet and exercise.  Follow lipid panel and liver function tests.   

## 2017-08-07 NOTE — Assessment & Plan Note (Signed)
Follow cbc.  

## 2017-08-09 ENCOUNTER — Encounter (HOSPITAL_COMMUNITY): Payer: Self-pay | Admitting: Psychiatry

## 2017-08-09 ENCOUNTER — Ambulatory Visit (INDEPENDENT_AMBULATORY_CARE_PROVIDER_SITE_OTHER): Payer: No Typology Code available for payment source | Admitting: Psychiatry

## 2017-08-09 VITALS — BP 126/74 | HR 60 | Ht 66.0 in | Wt 134.0 lb

## 2017-08-09 DIAGNOSIS — F331 Major depressive disorder, recurrent, moderate: Secondary | ICD-10-CM | POA: Diagnosis not present

## 2017-08-09 DIAGNOSIS — F411 Generalized anxiety disorder: Secondary | ICD-10-CM | POA: Diagnosis not present

## 2017-08-09 DIAGNOSIS — F4310 Post-traumatic stress disorder, unspecified: Secondary | ICD-10-CM | POA: Diagnosis not present
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
## 2017-08-10 ENCOUNTER — Ambulatory Visit: Payer: Medicare Other | Admitting: Psychiatry

## 2017-08-19 DIAGNOSIS — F4310 Post-traumatic stress disorder, unspecified: Secondary | ICD-10-CM | POA: Diagnosis not present

## 2017-08-19 DIAGNOSIS — F331 Major depressive disorder, recurrent, moderate: Secondary | ICD-10-CM | POA: Diagnosis not present

## 2017-08-19 DIAGNOSIS — F411 Generalized anxiety disorder: Secondary | ICD-10-CM | POA: Diagnosis not present

## 2017-08-25 ENCOUNTER — Other Ambulatory Visit: Payer: Medicare Other | Admitting: Orthotics

## 2017-08-25 NOTE — Progress Notes (Unsigned)
Has to be rescheduled.  Brace not in.

## 2017-09-01 ENCOUNTER — Ambulatory Visit (INDEPENDENT_AMBULATORY_CARE_PROVIDER_SITE_OTHER): Payer: Medicare Other | Admitting: Orthotics

## 2017-09-01 DIAGNOSIS — M2170 Unequal limb length (acquired), unspecified site: Secondary | ICD-10-CM

## 2017-09-01 DIAGNOSIS — M76822 Posterior tibial tendinitis, left leg: Secondary | ICD-10-CM | POA: Diagnosis not present

## 2017-09-01 DIAGNOSIS — G629 Polyneuropathy, unspecified: Secondary | ICD-10-CM

## 2017-09-01 DIAGNOSIS — M79671 Pain in right foot: Secondary | ICD-10-CM

## 2017-09-01 DIAGNOSIS — R202 Paresthesia of skin: Secondary | ICD-10-CM

## 2017-09-01 DIAGNOSIS — M79672 Pain in left foot: Secondary | ICD-10-CM

## 2017-09-01 DIAGNOSIS — R200 Anesthesia of skin: Secondary | ICD-10-CM

## 2017-09-02 DIAGNOSIS — F331 Major depressive disorder, recurrent, moderate: Secondary | ICD-10-CM | POA: Diagnosis not present

## 2017-09-02 DIAGNOSIS — F411 Generalized anxiety disorder: Secondary | ICD-10-CM | POA: Diagnosis not present

## 2017-09-02 DIAGNOSIS — F4310 Post-traumatic stress disorder, unspecified: Secondary | ICD-10-CM | POA: Diagnosis not present

## 2017-09-06 ENCOUNTER — Telehealth: Payer: Self-pay | Admitting: Internal Medicine

## 2017-09-06 ENCOUNTER — Other Ambulatory Visit: Payer: Self-pay | Admitting: Internal Medicine

## 2017-09-06 NOTE — Telephone Encounter (Signed)
Copied from [place] [phone]. Topic: Quick Communication - See Telephone Encounter >> Sep 06, 2017  4:44 PM Robina Ade, Helene Kelp D wrote: CRM for notification. See Telephone encounter for: 09/06/17. Patient called and said that is having discharge from on of her nipples and is concerning and also she needs a diagnostic US before she can have her mammo done.

## 2017-09-07 NOTE — Telephone Encounter (Signed)
Do you need to see pt first or can we place orders for [place]?

## 2017-09-07 NOTE — Telephone Encounter (Signed)
Called patient and she states that she has been having a brown milky discharge come out of right nipple. She states this has been going on for about 2 1/2 weeks but the discharge is not constant.She also states that her breast are painful and tender, but notes no swelling. She initially thought that maybe it was a mosquito bite but now thinks its something different. She is wanting to be seen at the [facility] breast center for possible ultra sound before having mammao.

## 2017-09-07 NOTE — Telephone Encounter (Signed)
I can see her Thursday at 12:30.  Work in for this.

## 2017-09-07 NOTE — Telephone Encounter (Signed)
Please call this patient and get more info. If having nipple discharge she will need to be evaluated

## 2017-09-07 NOTE — Telephone Encounter (Signed)
Patient scheduled.

## 2017-09-09 ENCOUNTER — Ambulatory Visit: Payer: Self-pay | Admitting: Internal Medicine

## 2017-09-13 ENCOUNTER — Encounter: Payer: Self-pay | Admitting: Psychiatry

## 2017-09-13 ENCOUNTER — Ambulatory Visit (INDEPENDENT_AMBULATORY_CARE_PROVIDER_SITE_OTHER): Payer: No Typology Code available for payment source | Admitting: Psychiatry

## 2017-09-13 ENCOUNTER — Other Ambulatory Visit: Payer: Self-pay

## 2017-09-13 VITALS — BP 146/93 | HR 59 | Temp 97.7°F | Wt 132.8 lb

## 2017-09-13 DIAGNOSIS — F331 Major depressive disorder, recurrent, moderate: Secondary | ICD-10-CM | POA: Diagnosis not present

## 2017-09-13 DIAGNOSIS — F4310 Post-traumatic stress disorder, unspecified: Secondary | ICD-10-CM | POA: Diagnosis not present

## 2017-09-13 DIAGNOSIS — F411 Generalized anxiety disorder: Secondary | ICD-10-CM

## 2017-09-13 NOTE — Progress Notes (Signed)
[place] MD  OP Progress Note  09/13/2017 3:02 PM [name]  MRN:  [id number]  Chief Complaint: ' I am here for follow up." Chief Complaint    Follow-up; Medication Refill     HPI: [name] is a 72 year old Caucasian female, lives in [place], divorced, has a history of MDD, GAD, PTSD, multiple surgeries of her hip, knee, hypertension, hyperlipidemia, history of tumor of the pituitary gland, presented to the clinic today for a follow-up visit.  Patient continues to have some thought blocking, sadness, tearfulness and psychomotor retardation.  Patient continues to however decline medications since she is worried about side effects including weight gain.  Patient was referred for [place] consult and was seen by provider in [facility].  Patient however reports she is unable to find transportation to go there for 7 weeks, 5 days a week at this time.  She reports she wants to think about it and give it more time.  Patient started psychotherapy visits with Ms. Tina Thompson-therapist.  Patient has an upcoming appointment scheduled with her and plans to see her every 2 weeks or so.  Patient denies any suicidality today.  Discussed medications with her again and also discussed genetic testing.  Patient agrees to genetic testing and hence will refer her to do the same.  Patient does report some memory problems however reports she was evaluated by neurology a year ago for memory issues and was cleared.  Discussed with patient she can be referred back since she continues to have some memory issues as well as word finding difficulties, unknown if this is due to her mood symptoms.  Visit Diagnosis:    ICD-10-CM   1. MDD (major depressive disorder), recurrent episode, moderate (HCC) F33.1   2. PTSD (post-traumatic stress disorder) F43.10   3. GAD (generalized anxiety disorder) F41.1     Past Psychiatric History: I have reviewed past psychiatric history from my progress note on 07/14/2017.   Past trials of Zoloft, Wellbutrin, Effexor, Celexa, BuSpar, trazodone, Abilify.  Past Medical History:  Past Medical History:  Diagnosis Date  . Anemia   . Anxiety   . Arthritis   . Burning feet syndrome   . Chronic insomnia   . Complication of anesthesia    "AFFECTS MY MEMORY"  . COPD (chronic obstructive pulmonary disease) ([place])    documented in outside records  . Depression   . Dysrhythmia    PALPITATIONS   . Edema    MILD ANKLE  . GERD (gastroesophageal reflux disease)   . Heart murmur   . History of stomach ulcers   . HOH (hard of hearing)    AIDS  . Hypercholesterolemia   . Hyperparathyroidism, primary ([place])   . Hypertension   . Neuropathy   . Osteoporosis   . Palpitations   . Scoliosis   . Shortness of breath    with exertion  . Tingling    both hands    Past Surgical History:  Procedure Laterality Date  . ANTERIOR CERVICAL DECOMP/DISCECTOMY FUSION  10/28/2011   Procedure: ANTERIOR CERVICAL DECOMPRESSION/DISCECTOMY FUSION 2 LEVELS;  Surgeon: Elaina Hoops, MD;  Location: [facility];  Service: Neurosurgery;  Laterality: N/A;  . BREAST BIOPSY Right 1992   neg  . BREAST SURGERY     RT BREAST CYST REMOVED 15 YRS AGO   . CATARACT EXTRACTION W/PHACO Right 10/30/2014   Procedure: CATARACT EXTRACTION PHACO AND INTRAOCULAR LENS PLACEMENT (IOC);  Surgeon: Birder Robson, MD;  Location: [facility] ORS;  Service:  Ophthalmology;  Laterality: Right;  US0:40 AP18.2 CDE7.42  casette lot #[id number]  . CATARACT EXTRACTION W/PHACO Left 11/27/2014   Procedure: CATARACT EXTRACTION PHACO AND INTRAOCULAR LENS PLACEMENT (IOC);  Surgeon: Birder Robson, MD;  Location: [facility] ORS;  Service: Ophthalmology;  Laterality: Left;  [place] 00:36.9 AP%: 18.9 CDE: 6.96  cas[place] lot #  [id number]  . CYSTOCELE REPAIR N/A 05/13/2015   Procedure: ANTERIOR REPAIR (CYSTOCELE);  Surgeon: Brayton Mars, MD;  Location: [facility] ORS;  Service: Gynecology;  Laterality: N/A;  . CYSTOSCOPY  05/13/2015    Procedure: CYSTOSCOPY;  Surgeon: Brayton Mars, MD;  Location: [facility] ORS;  Service: Gynecology;;  . DILATION AND CURETTAGE OF UTERUS     X2   . JOINT REPLACEMENT     RT HIP  2013  (PARTIAL)   . PARATHYROIDECTOMY Left 08/25/2012   Procedure: PARATHYROIDECTOMY;  Surgeon: Earnstine Regal, MD;  Location: [facility];  Service: General;  Laterality: Left;  . PITUITARY SURGERY     FOR TUMOR 3 YRS   . TOTAL KNEE ARTHROPLASTY Right 07/18/2014   Procedure: TOTAL KNEE ARTHROPLASTY;  Surgeon: Dereck Leep, MD;  Location: [facility] ORS;  Service: Orthopedics;  Laterality: Right;  Marland Kitchen VAGINAL HYSTERECTOMY Bilateral 05/13/2015   Procedure: HYSTERECTOMY VAGINAL WITH BILATERAL SALPINGO OOPHERECTOMY;  Surgeon: Brayton Mars, MD;  Location: [facility] ORS;  Service: Gynecology;  Laterality: Bilateral;  . VAGINAL HYSTERECTOMY     bso ant repair    Family Psychiatric History: I have reviewed family psychiatric history from my progress note on 07/14/2017.  Family History:  Family History  Problem Relation Age of Onset  . Glaucoma Mother   . Hypertension Mother   . Thyroid disease Mother   . Stroke Father   . Hypertension Father   . Bipolar disorder Sister   . Cancer Neg Hx   . Diabetes Neg Hx    Substance abuse history: Denies  Social History: I have reviewed social history from my progress note on 07/14/2017. Social History   Socioeconomic History  . Marital status: Divorced    Spouse name: Not on file  . Number of children: 1  . Years of education: Not on file  . Highest education level: Some college, no degree  Occupational History  . Occupation: retired  Scientific laboratory technician  . Financial resource strain: Hard  . Food insecurity:    Worry: Often true    Inability: Often true  . Transportation needs:    Medical: No    Non-medical: No  Tobacco Use  . Smoking status: Former Smoker    Last attempt to quit: 02/10/2008    Years since quitting: 9.5  . Smokeless tobacco: Never Used  Substance and Sexual  Activity  . Alcohol use: Yes    Alcohol/week: 0.0 oz    Comment: rare  . Drug use: No  . Sexual activity: Never    Birth control/protection: Post-menopausal  Lifestyle  . Physical activity:    Days per week: 0 days    Minutes per session: 0 min  . Stress: Rather much  Relationships  . Social connections:    Talks on phone: Not on file    Gets together: Not on file    Attends religious service: Never    Active member of club or organization: No    Attends meetings of clubs or organizations: Never    Relationship status: Divorced  Other Topics Concern  . Not on file  Social History Narrative  . Not on file  Allergies:  Allergies  Allergen Reactions  . Sulfamethoxazole-Trimethoprim Other (See Comments)    GI Upset  . Citalopram Hives  . Tape Itching    Metabolic Disorder Labs: Lab Results  Component Value Date   HGBA1C 5.8 05/26/2016   No results found for: PROLACTIN Lab Results  Component Value Date   CHOL 199 07/30/2017   TRIG 108.0 07/30/2017   HDL 79.40 07/30/2017   CHOLHDL 3 07/30/2017   VLDL 21.6 07/30/2017   LDLCALC 98 07/30/2017   LDLCALC 84 03/30/2017   Lab Results  Component Value Date   TSH 1.55 10/15/2016   TSH 1.11 08/08/2015    Therapeutic Level Labs: No results found for: LITHIUM No results found for: VALPROATE No components found for:  CBMZ  Current Medications: Current Outpatient Medications  Medication Sig Dispense Refill  . acetaminophen (TYLENOL) 500 MG tablet Take 1,000 mg by mouth every 6 (six) hours as needed for fever.     . Alpha-Lipoic Acid 600 MG CAPS Take by mouth.    Marland Kitchen amLODipine (NORVASC) 5 MG tablet Take 1 tablet (5 mg total) by mouth daily. 90 tablet 1  . aspirin EC 81 MG tablet Take 81 mg by mouth daily.    Marland Kitchen atorvastatin (LIPITOR) 40 MG tablet Take 1 tablet (40 mg total) by mouth daily. 90 tablet 1  . Biotin (BIOTIN MAXIMUM STRENGTH) 10 MG TABS Take 1 tablet by mouth daily.    . chlorthalidone (HYGROTON) 25 MG  tablet TAKE 1 TABLET BY MOUTH EVERY DAY 90 tablet 1  . cholecalciferol (VITAMIN D) 1000 UNITS tablet Take 1,000 Units by mouth daily.     Marland Kitchen docusate sodium (COLACE) 100 MG capsule Take 100 mg by mouth 2 (two) times daily as needed for mild constipation.     . ferrous sulfate 325 (65 FE) MG tablet Take 325 mg by mouth daily.    Marland Kitchen gabapentin (NEURONTIN) 300 MG capsule Take 300 mg by mouth at bedtime.    . Glycerin-Polysorbate 80 (REFRESH DRY EYE THERAPY OP) Apply to eye.    . losartan (COZAAR) 100 MG tablet TAKE ONE TABLET EVERY DAY 90 tablet 1  . magnesium oxide (MAG-OX) 400 MG tablet Take 400 mg by mouth daily.    . Multiple Vitamins-Minerals (MULTIVITAMIN PO) Take 1 tablet by mouth daily.    . pantoprazole (PROTONIX) 40 MG tablet TAKE ONE TABLET BY MOUTH EVERY DAY 60 tablet 3  . potassium chloride (K-DUR) 10 MEQ tablet Take 1 tablet (10 mEq total) by mouth daily. 90 tablet 1  . Probiotic Product (FLORAJEN3 PO) Take 1 tablet by mouth.    . ranitidine (ZANTAC) 150 MG tablet Take 150 mg by mouth 2 (two) times daily.     No current facility-administered medications for this visit.      Musculoskeletal: Strength & Muscle Tone: within normal limits Gait & Station: normal Patient leans: N/A  Psychiatric Specialty Exam: Review of Systems  Psychiatric/Behavioral: Positive for depression. The patient is nervous/anxious.   All other systems reviewed and are negative.   Blood pressure (!) 146/93, pulse (!) 59, temperature 97.7 F (36.5 C), temperature source Oral, weight 132 lb 12.8 oz (60.2 kg).Body mass index is 21.43 kg/m.  General Appearance: Casual  Eye Contact:  Fair  Speech:  Normal Rate  Volume:  Normal  Mood:  Anxious and Dysphoric  Affect:  Appropriate  Thought Process:  Goal Directed and Descriptions of Associations: Intact  Orientation:  Full (Time, Place, and Person)  Thought Content: Logical  Suicidal Thoughts:  No  Homicidal Thoughts:  No  Memory:  Immediate;    Fair Recent;   Fair Remote;   Fair  Judgement:  Fair  Insight:  Fair  Psychomotor Activity:  Normal  Concentration:  Concentration: Fair and Attention Span: Fair  Recall:  [employer] of Knowledge: Fair  Language: Fair  Akathisia:  No  Handed:  Right  AIMS (if indicated):na  Assets:  Communication Skills Desire for Improvement Social Support  ADL's:  Intact  Cognition: WNL  Sleep:  Fair   Screenings: Mini-Mental     Office Visit from 06/18/2014 in [place]  Total Score (max 30 points )  30    PHQ2-9     Clinical Support from 02/05/2017 in [facility] from 01/29/2016 in [facility] Office Visit from 10/11/2015 in [place] Office Visit from 06/18/2014 in [place] Office Visit from 05/17/2014 in [facility]  PHQ-2 Total Score  1  2  3  2   0  PHQ-9 Total Score  3  6  14  8   -       Assessment and Plan: Brinn is a 72 year old Caucasian female, divorced, lives in [place], has a history of depression, anxiety, PTSD, hyperlipidemia, multiple hip surgeries, knee surgeries, history of pituitary gland tumor, history of headaches, presented to the clinic today for a follow-up visit.  Patient with several psychosocial stressors including relationship struggles, limited social support system, being divorced and so on.  She is biologically predisposed given her history of trauma, history of mental health problems in her family and so on.  Patient was referred for [place] since she declined medications and was interested in it.  Patient however reports she is unable to find transportation and hence want to continue psychotherapy for now.  Patient however is interested in genesight testing today and hence will refer her for the same.  Plan MDD Patient will continue psychotherapy with Ms. Miguel Dibble. Patient has been referred for [place] however has  transportation problems, hence pending. Patient will be referred today for genesight testing.  GAD Continue CBT  Insomnia Patient declines medications.  R/O Cognitive disorder Pt with word finding difficulty, unknown if this is due to her depressive sx. [place] Per progress note per Dr.Eksir on 08/09/2017 - 21/30 Patient will be referred back to her neurologist-Dr. Manuella Ghazi  Follow-up in clinic in 2 months, she will continue CBT on a more frequent basis in the meantime.  Patient to call clinic sooner if she is interested in medications.  More than 50 % of the time was spent for psychoeducation and supportive psychotherapy and care coordination.  This note was generated in part or whole with voice recognition software. Voice recognition is usually quite accurate but there are transcription errors that can and very often do occur. I apologize for any typographical errors that were not detected and corrected.         Ursula Alert, MD 09/13/2017, 3:02 PM

## 2017-09-14 ENCOUNTER — Encounter: Payer: Self-pay | Admitting: Internal Medicine

## 2017-09-14 ENCOUNTER — Ambulatory Visit (INDEPENDENT_AMBULATORY_CARE_PROVIDER_SITE_OTHER): Payer: Medicare Other | Admitting: Internal Medicine

## 2017-09-14 VITALS — BP 110/76 | HR 62 | Temp 98.0°F | Resp 18 | Wt 132.6 lb

## 2017-09-14 DIAGNOSIS — N6452 Nipple discharge: Secondary | ICD-10-CM

## 2017-09-14 DIAGNOSIS — N6459 Other signs and symptoms in breast: Secondary | ICD-10-CM

## 2017-09-14 DIAGNOSIS — I10 Essential (primary) hypertension: Secondary | ICD-10-CM | POA: Diagnosis not present

## 2017-09-14 NOTE — Progress Notes (Signed)
Patient ID: [name], female   DOB: 07-14-1945, 72 y.o.   MRN: [id number]   Subjective:    Patient ID: [name], female    DOB: May 23, 1945, 72 y.o.   MRN: [id number]  HPI  Patient here as a work in with concerns regarding nipple discharge.  She reports that over the past three weeks she has noticed some brownish/white discharge.  Noticed brown spots on her bra.  Only - right breast.  No pain.  No fever.  No increased erythema.  Nipple minimally inverted.  No palpable nodule.  Has not noticed any nipple discharge today.     Past Medical History:  Diagnosis Date  . Anemia   . Anxiety   . Arthritis   . Burning feet syndrome   . Chronic insomnia   . Complication of anesthesia    "AFFECTS MY MEMORY"  . COPD (chronic obstructive pulmonary disease) ([place])    documented in outside records  . Depression   . Dysrhythmia    PALPITATIONS   . Edema    MILD ANKLE  . GERD (gastroesophageal reflux disease)   . Heart murmur   . History of stomach ulcers   . HOH (hard of hearing)    AIDS  . Hypercholesterolemia   . Hyperparathyroidism, primary ([place])   . Hypertension   . Neuropathy   . Osteoporosis   . Palpitations   . Scoliosis   . Shortness of breath    with exertion  . Tingling    both hands   Past Surgical History:  Procedure Laterality Date  . ANTERIOR CERVICAL DECOMP/DISCECTOMY FUSION  10/28/2011   Procedure: ANTERIOR CERVICAL DECOMPRESSION/DISCECTOMY FUSION 2 LEVELS;  Surgeon: Elaina Hoops, MD;  Location: [facility];  Service: Neurosurgery;  Laterality: N/A;  . BREAST BIOPSY Right 1992   neg  . BREAST SURGERY     RT BREAST CYST REMOVED 15 YRS AGO   . CATARACT EXTRACTION W/PHACO Right 10/30/2014   Procedure: CATARACT EXTRACTION PHACO AND INTRAOCULAR LENS PLACEMENT (IOC);  Surgeon: Birder Robson, MD;  Location: [facility];  Service: Ophthalmology;  Laterality: Right;  US0:40 AP18.2 CDE7.42  casette lot #[id number]  . CATARACT EXTRACTION W/PHACO Left 11/27/2014   Procedure: CATARACT EXTRACTION PHACO AND INTRAOCULAR LENS PLACEMENT (IOC);  Surgeon: Birder Robson, MD;  Location: [facility] ORS;  Service: Ophthalmology;  Laterality: Left;  [place] 00:36.9 AP%: 18.9 CDE: 6.96  cas[place] lot #  [id number]  . CYSTOCELE REPAIR N/A 05/13/2015   Procedure: ANTERIOR REPAIR (CYSTOCELE);  Surgeon: Brayton Mars, MD;  Location: [facility];  Service: Gynecology;  Laterality: N/A;  . CYSTOSCOPY  05/13/2015   Procedure: CYSTOSCOPY;  Surgeon: Brayton Mars, MD;  Location: [facility];  Service: Gynecology;;  . DILATION AND CURETTAGE OF UTERUS     X2   . JOINT REPLACEMENT     RT HIP  2013  (PARTIAL)   . PARATHYROIDECTOMY Left 08/25/2012   Procedure: PARATHYROIDECTOMY;  Surgeon: Earnstine Regal, MD;  [facility];  Service: General;  Laterality: Left;  . PITUITARY SURGERY     FOR TUMOR 3 YRS   . TOTAL KNEE ARTHROPLASTY Right 07/18/2014   Procedure: TOTAL KNEE ARTHROPLASTY;  Surgeon: Dereck Leep, MD;  Location: [facility];  Service: Orthopedics;  Laterality: Right;  Marland Kitchen VAGINAL HYSTERECTOMY Bilateral 05/13/2015   Procedure: HYSTERECTOMY VAGINAL WITH BILATERAL SALPINGO OOPHERECTOMY;  Surgeon: Brayton Mars, MD;  Location: [facility] OR[facility];  Service: Gynecology;  Laterality: Bilateral;  . VAGINAL HYSTERECTOMY  bso ant repair   Family History  Problem Relation Age of Onset  . Glaucoma Mother   . Hypertension Mother   . Thyroid disease Mother   . Stroke Father   . Hypertension Father   . Bipolar disorder Sister   . Cancer Neg Hx   . Diabetes Neg Hx    Social History   Socioeconomic History  . Marital status: Divorced    Spouse name: Not on file  . Number of children: 1  . Years of education: Not on file  . Highest education level: Some college, no degree  Occupational History  . Occupation: retired  Scientific laboratory technician  . Financial resource strain: Hard  . Food insecurity:    Worry: Often true    Inability: Often true  . Transportation needs:    Medical: No     Non-medical: No  Tobacco Use  . Smoking status: Former Smoker    Last attempt to quit: 02/10/2008    Years since quitting: 9.6  . Smokeless tobacco: Never Used  Substance and Sexual Activity  . Alcohol use: Yes    Alcohol/week: 0.0 standard drinks    Comment: rare  . Drug use: No  . Sexual activity: Never    Birth control/protection: Post-menopausal  Lifestyle  . Physical activity:    Days per week: 0 days    Minutes per session: 0 min  . Stress: Rather much  Relationships  . Social connections:    Talks on phone: Not on file    Gets together: Not on file    Attends religious service: Never    Active member of club or organization: No    Attends meetings of clubs or organizations: Never    Relationship status: Divorced  Other Topics Concern  . Not on file  Social History Narrative  . Not on file    Outpatient Encounter Medications as of 09/14/2017  Medication Sig  . acetaminophen (TYLENOL) 500 MG tablet Take 1,000 mg by mouth every 6 (six) hours as needed for fever.   . Alpha-Lipoic Acid 600 MG CAPS Take by mouth.  Marland Kitchen amLODipine (NORVASC) 5 MG tablet Take 1 tablet (5 mg total) by mouth daily.  Marland Kitchen aspirin EC 81 MG tablet Take 81 mg by mouth daily.  Marland Kitchen atorvastatin (LIPITOR) 40 MG tablet Take 1 tablet (40 mg total) by mouth daily.  . Biotin (BIOTIN MAXIMUM STRENGTH) 10 MG TABS Take 1 tablet by mouth daily.  . chlorthalidone (HYGROTON) 25 MG tablet TAKE 1 TABLET BY MOUTH EVERY DAY  . cholecalciferol (VITAMIN D) 1000 UNITS tablet Take 1,000 Units by mouth daily.   Marland Kitchen docusate sodium (COLACE) 100 MG capsule Take 100 mg by mouth 2 (two) times daily as needed for mild constipation.   . ferrous sulfate 325 (65 FE) MG tablet Take 325 mg by mouth daily.  Marland Kitchen gabapentin (NEURONTIN) 300 MG capsule Take 300 mg by mouth at bedtime.  . Glycerin-Polysorbate 80 (REFRESH DRY EYE THERAPY OP) Apply to eye.  . losartan (COZAAR) 100 MG tablet TAKE ONE TABLET EVERY DAY  . magnesium oxide (MAG-OX) 400  MG tablet Take 400 mg by mouth daily.  . Multiple Vitamins-Minerals (MULTIVITAMIN PO) Take 1 tablet by mouth daily.  . pantoprazole (PROTONIX) 40 MG tablet TAKE ONE TABLET BY MOUTH EVERY DAY  . potassium chloride (K-DUR) 10 MEQ tablet Take 1 tablet (10 mEq total) by mouth daily.  . Probiotic Product (FLORAJEN3 PO) Take 1 tablet by mouth.  . ranitidine (ZANTAC) 150 MG  tablet Take 150 mg by mouth 2 (two) times daily.   No facility-administered encounter medications on file as of 09/14/2017.     Review of Systems  Constitutional: Negative for appetite change and fever.  Cardiovascular: Negative for chest pain.  Gastrointestinal: Negative for nausea and vomiting.  Skin:       No increased erythema or warmth - breast.  Nipple discharge.   Psychiatric/Behavioral: Negative for agitation and dysphoric mood.       Objective:    Physical Exam  Constitutional: She appears well-developed and well-nourished. No distress.  Neck: Neck supple.  Cardiovascular: Normal rate and regular rhythm.  Pulmonary/Chest: Breath sounds normal. No respiratory distress. She has no wheezes.  Breasts:  Right nipple inverted.  Could not express any discharge from the breast.  No palpable nodule.  No axillary adenopathy.  Left breast - no nipple inversion.  No palpable nodule.  No axillary adenopathy appreciated.    Abdominal: There is no tenderness.  Lymphadenopathy:    She has no cervical adenopathy.    BP 110/76 (BP Location: Left Arm, Patient Position: Sitting, Cuff Size: Normal)   Pulse 62   Temp 98 F (36.7 C) (Oral)   Resp 18   Wt 132 lb 9.6 oz (60.1 kg)   SpO2 98%   BMI 21.40 kg/m  Wt Readings from Last 3 Encounters:  09/14/17 132 lb 9.6 oz (60.1 kg)  08/03/17 133 lb 9.6 oz (60.6 kg)  04/20/17 135 lb (61.2 kg)     Lab Results  Component Value Date   WBC 6.2 07/30/2017   HGB 14.2 07/30/2017   HCT 41.9 07/30/2017   PLT 255.0 07/30/2017   GLUCOSE 93 07/30/2017   CHOL 199 07/30/2017   TRIG  108.0 07/30/2017   HDL 79.40 07/30/2017   LDLDIRECT 86.0 05/26/2016   LDLCALC 98 07/30/2017   ALT 22 07/30/2017   AST 16 07/30/2017   NA 143 07/30/2017   K 3.8 07/30/2017   CL 102 07/30/2017   CREATININE 0.63 07/30/2017   BUN 18 07/30/2017   CO2 35 (H) 07/30/2017   TSH 1.55 10/15/2016   INR 0.91 07/05/2014   HGBA1C 5.8 05/26/2016    Mm Screening Breast Tomo Bilateral  Result Date: 09/25/2016 CLINICAL DATA:  Screening. EXAM: 2D DIGITAL SCREENING BILATERAL MAMMOGRAM WITH CAD AND ADJUNCT TOMO COMPARISON:  Previous exam(s). ACR Breast Density Category c: The breast tissue is heterogeneously dense, which may obscure small masses. FINDINGS: There are no findings suspicious for malignancy. Images were processed with CAD. IMPRESSION: No mammographic evidence of malignancy. A result letter of this screening mammogram will be mailed directly to the patient. RECOMMENDATION: Screening mammogram in one year. (Code:SM-B-01Y) BI-RADS CATEGORY  1: Negative. Electronically Signed   By: Lajean Manes M.D.   On: 09/25/2016 11:02       Assessment & Plan:   Problem List Items Addressed This Visit    Hypertension    Blood pressure under good control.  Continue same medication regimen.  Follow pressures.  Follow metabolic panel.        Nipple discharge - Primary    Nipple discharge and nipple inversion as outlined.  Unable to express any discharge today.  Described as brown discharge.  No palpable nodules.  Will obtain diagnostic mammogram with ultrasound.  Further w/up pending results.  Discussed surgery referral.        Relevant Orders   MM DIAG BREAST TOMO BILATERAL   US BREAST LTD UNI RIGHT INC AXILLA   US  BREAST LTD UNI LEFT INC AXILLA    Other Visit Diagnoses    Inversion of nipple       Relevant Orders   MM DIAG BREAST TOMO BILATERAL   US BREAST LTD UNI RIGHT INC AXILLA   US BREAST LTD UNI LEFT INC Noreene Filbert, MD

## 2017-09-15 ENCOUNTER — Other Ambulatory Visit: Payer: Medicare Other | Admitting: Orthotics

## 2017-09-15 DIAGNOSIS — F331 Major depressive disorder, recurrent, moderate: Secondary | ICD-10-CM | POA: Diagnosis not present

## 2017-09-15 DIAGNOSIS — F411 Generalized anxiety disorder: Secondary | ICD-10-CM | POA: Diagnosis not present

## 2017-09-15 DIAGNOSIS — F4310 Post-traumatic stress disorder, unspecified: Secondary | ICD-10-CM | POA: Diagnosis not present

## 2017-09-16 ENCOUNTER — Encounter: Payer: Self-pay | Admitting: Internal Medicine

## 2017-09-16 DIAGNOSIS — N6452 Nipple discharge: Secondary | ICD-10-CM | POA: Insufficient documentation

## 2017-09-16 NOTE — Assessment & Plan Note (Signed)
Nipple discharge and nipple inversion as outlined.  Unable to express any discharge today.  Described as brown discharge.  No palpable nodules.  Will obtain diagnostic mammogram with ultrasound.  Further w/up pending results.  Discussed surgery referral.

## 2017-09-16 NOTE — Assessment & Plan Note (Signed)
Blood pressure under good control.  Continue same medication regimen.  Follow pressures.  Follow metabolic panel.   

## 2017-09-17 ENCOUNTER — Ambulatory Visit
Admission: RE | Admit: 2017-09-17 | Discharge: 2017-09-17 | Disposition: A | Discharge status: Home / Self Care | Payer: Medicare Other | Source: Ambulatory Visit | Attending: Internal Medicine | Admitting: Internal Medicine

## 2017-09-17 DIAGNOSIS — N6452 Nipple discharge: Secondary | ICD-10-CM | POA: Diagnosis present

## 2017-09-17 DIAGNOSIS — N6489 Other specified disorders of breast: Secondary | ICD-10-CM | POA: Diagnosis not present

## 2017-09-17 DIAGNOSIS — N6459 Other signs and symptoms in breast: Secondary | ICD-10-CM | POA: Diagnosis present

## 2017-09-17 DIAGNOSIS — R922 Inconclusive mammogram: Secondary | ICD-10-CM | POA: Diagnosis not present

## 2017-09-21 ENCOUNTER — Other Ambulatory Visit: Payer: Self-pay | Admitting: Internal Medicine

## 2017-09-21 ENCOUNTER — Telehealth: Payer: Self-pay | Admitting: Internal Medicine

## 2017-09-21 DIAGNOSIS — N6452 Nipple discharge: Secondary | ICD-10-CM

## 2017-09-21 NOTE — Progress Notes (Signed)
Order placed for referral to surgery.  

## 2017-09-21 NOTE — Telephone Encounter (Signed)
Spoke with patient to let her know that Dr Nicki Reaper placed the order for her to see Dr. Bary Castilla and she can call them back to set up an appt as we discussed yesterday.

## 2017-09-21 NOTE — Telephone Encounter (Signed)
Patient called again to get advice from the doctor regarding the surgery that was recommended by the breast center.  She would like to speak with the doctor before she makes her final decision.  Patient would like a call back as soon as possible.  CB# [phone].

## 2017-09-21 NOTE — Telephone Encounter (Signed)
Patient has questions about seeing a Psychologist, sport and exercise.

## 2017-09-21 NOTE — Telephone Encounter (Signed)
Patient calling back for surgical advise.

## 2017-09-21 NOTE — Telephone Encounter (Signed)
Copied from [place] [id number]. Topic: Inquiry >> Sep 21, 2017 11:40 AM Mylinda Latina, NT wrote: Reason for CRM: Patient called and states that she was seen at [facility] and she states that someone at [facility] suggested you see a General Surgeon. Patient states she would like to talk to Dr. Nicki Reaper and get her recommendation. Please call CB# [phone]

## 2017-09-22 ENCOUNTER — Encounter: Payer: Self-pay | Admitting: General Surgery

## 2017-09-25 ENCOUNTER — Other Ambulatory Visit: Payer: Self-pay | Admitting: Internal Medicine

## 2017-09-29 DIAGNOSIS — D225 Melanocytic nevi of trunk: Secondary | ICD-10-CM | POA: Diagnosis not present

## 2017-09-29 DIAGNOSIS — D2261 Melanocytic nevi of right upper limb, including shoulder: Secondary | ICD-10-CM | POA: Diagnosis not present

## 2017-09-29 DIAGNOSIS — D2262 Melanocytic nevi of left upper limb, including shoulder: Secondary | ICD-10-CM | POA: Diagnosis not present

## 2017-09-29 DIAGNOSIS — D2272 Melanocytic nevi of left lower limb, including hip: Secondary | ICD-10-CM | POA: Diagnosis not present

## 2017-09-29 DIAGNOSIS — C44712 Basal cell carcinoma of skin of right lower limb, including hip: Secondary | ICD-10-CM | POA: Diagnosis not present

## 2017-09-30 DIAGNOSIS — F4310 Post-traumatic stress disorder, unspecified: Secondary | ICD-10-CM | POA: Diagnosis not present

## 2017-09-30 DIAGNOSIS — F411 Generalized anxiety disorder: Secondary | ICD-10-CM | POA: Diagnosis not present

## 2017-09-30 DIAGNOSIS — F331 Major depressive disorder, recurrent, moderate: Secondary | ICD-10-CM | POA: Diagnosis not present

## 2017-10-11 ENCOUNTER — Other Ambulatory Visit: Payer: Self-pay | Admitting: Internal Medicine

## 2017-10-12 ENCOUNTER — Other Ambulatory Visit: Payer: Self-pay

## 2017-10-19 ENCOUNTER — Encounter: Payer: Self-pay | Admitting: General Surgery

## 2017-10-19 ENCOUNTER — Ambulatory Visit (INDEPENDENT_AMBULATORY_CARE_PROVIDER_SITE_OTHER): Payer: Medicare Other | Admitting: General Surgery

## 2017-10-19 VITALS — BP 128/72 | HR 54 | Resp 12 | Ht 66.0 in | Wt 131.0 lb

## 2017-10-19 DIAGNOSIS — N6452 Nipple discharge: Secondary | ICD-10-CM | POA: Diagnosis not present

## 2017-10-19 NOTE — Progress Notes (Signed)
Patient ID: [name], female   DOB: 1945-03-28, 72 y.o.   MRN: [id number]  Chief Complaint  Patient presents with  . Breast Problem    HPI [name] is a 72 y.o. female here today for a evaluation of right nipple discharge referred by Dr Ronda Fairly. Patient states she noticed this about 6 weeks ago. She states the color is a brownish/white, noticed it in her bra. The drainage was last seen 3 weeks ago.  Steady decrease in frequency since onset.  No history of erythema or induration.  No history nipple oral contact.  She had a mammogram on 09/27/2017 and right breast ultrasound. She states her breast have always been tender/sore. Denies any breast injury or trauma. She does have a 13 pound dog but denies him jumping on her. Patient does not perform regular self breast checks but she gets regular mammograms done.    She is retired from [employer].  HPI  Past Medical History:  Diagnosis Date  . Anemia   . Anxiety   . Arthritis   . Burning feet syndrome   . Chronic insomnia   . Complication of anesthesia    "AFFECTS MY MEMORY"  . COPD (chronic obstructive pulmonary disease) ([place])    documented in outside records  . Depression   . Dysrhythmia    PALPITATIONS   . Edema    MILD ANKLE  . GERD (gastroesophageal reflux disease)   . Heart murmur   . History of stomach ulcers   . HOH (hard of hearing)    AIDS  . Hypercholesterolemia   . Hyperparathyroidism, primary ([place])   . Hypertension   . Neuropathy   . Osteoporosis   . Palpitations   . Scoliosis   . Shortness of breath    with exertion  . Tingling    both hands    Past Surgical History:  Procedure Laterality Date  . ANTERIOR CERVICAL DECOMP/DISCECTOMY FUSION  10/28/2011   Procedure: ANTERIOR CERVICAL DECOMPRESSION/DISCECTOMY FUSION 2 LEVELS;  Surgeon: Elaina Hoops, MD;  Location: [facility];  Service: Neurosurgery;  Laterality: N/A;  . BREAST BIOPSY Right 1992   neg  . BREAST SURGERY     RT BREAST CYST REMOVED 15 YRS  AGO   . CATARACT EXTRACTION W/PHACO Right 10/30/2014   Procedure: CATARACT EXTRACTION PHACO AND INTRAOCULAR LENS PLACEMENT (IOC);  Surgeon: Birder Robson, MD;  Location: [facility];  Service: Ophthalmology;  Laterality: Right;  US0:40 AP18.2 CDE7.42  casette lot #[id number]  . CATARACT EXTRACTION W/PHACO Left 11/27/2014   Procedure: CATARACT EXTRACTION PHACO AND INTRAOCULAR LENS PLACEMENT (IOC);  Surgeon: Birder Robson, MD;  Location: [facility];  Service: Ophthalmology;  Laterality: Left;  [place] 00:36.9 AP%: 18.9 CDE: 6.96  casette lot #  [id number]  . CYSTOCELE REPAIR N/A 05/13/2015   Procedure: ANTERIOR REPAIR (CYSTOCELE);  Surgeon: Brayton Mars, MD;  Location: [facility];  Service: Gynecology;  Laterality: N/A;  . CYSTOSCOPY  05/13/2015   Procedure: CYSTOSCOPY;  Surgeon: Brayton Mars, MD;  Location: [facility];  Service: Gynecology;;  . DILATION AND CURETTAGE OF UTERUS     X2   . JOINT REPLACEMENT     RT HIP  2013  (PARTIAL)   . PARATHYROIDECTOMY Left 08/25/2012   Procedure: PARATHYROIDECTOMY;  Surgeon: Earnstine Regal, MD;  Location: [facility];  Service: General;  Laterality: Left;  . PITUITARY SURGERY     FOR TUMOR 3 YRS   . TOTAL KNEE ARTHROPLASTY Right 07/18/2014   Procedure:  TOTAL KNEE ARTHROPLASTY;  Surgeon: Dereck Leep, MD;  Location: [facility];  Service: Orthopedics;  Laterality: Right;  Marland Kitchen VAGINAL HYSTERECTOMY Bilateral 05/13/2015   Procedure: HYSTERECTOMY VAGINAL WITH BILATERAL SALPINGO OOPHERECTOMY;  Surgeon: Brayton Mars, MD;  Location: [facility];  Service: Gynecology;  Laterality: Bilateral;  . VAGINAL HYSTERECTOMY     bso ant repair    Family History  Problem Relation Age of Onset  . Glaucoma Mother   . Hypertension Mother   . Thyroid disease Mother   . Stroke Father   . Hypertension Father   . Bipolar disorder Sister   . Breast cancer Paternal Aunt        60's  . Cancer Neg Hx   . Diabetes Neg Hx     Social History Social History   Tobacco Use  .  Smoking status: Former Smoker    Packs/day: 1.00    Years: 35.00    Pack years: 35.00    Types: Cigarettes    Last attempt to quit: 02/10/2008    Years since quitting: 9.7  . Smokeless tobacco: Never Used  Substance Use Topics  . Alcohol use: Yes    Alcohol/week: 0.0 standard drinks    Comment: rare  . Drug use: No    Allergies  Allergen Reactions  . Sulfamethoxazole-Trimethoprim Other (See Comments)    GI Upset  . Citalopram Hives  . Tape Itching    Current Outpatient Medications  Medication Sig Dispense Refill  . acetaminophen (TYLENOL) 500 MG tablet Take 1,000 mg by mouth every 6 (six) hours as needed for fever.     . Alpha-Lipoic Acid 600 MG CAPS Take by mouth.    Marland Kitchen amLODipine (NORVASC) 5 MG tablet Take 1 tablet (5 mg total) by mouth daily. 90 tablet 1  . aspirin EC 81 MG tablet Take 81 mg by mouth daily.    Marland Kitchen atorvastatin (LIPITOR) 40 MG tablet Take 1 tablet (40 mg total) by mouth daily. 90 tablet 1  . Biotin (BIOTIN MAXIMUM STRENGTH) 10 MG TABS Take 1 tablet by mouth daily.    . chlorthalidone (HYGROTON) 25 MG tablet TAKE ONE TABLET EVERY DAY 90 tablet 1  . cholecalciferol (VITAMIN D) 1000 UNITS tablet Take 1,000 Units by mouth daily.     Marland Kitchen docusate sodium (COLACE) 100 MG capsule Take 100 mg by mouth 2 (two) times daily as needed for mild constipation.     . ferrous sulfate 325 (65 FE) MG tablet Take 325 mg by mouth daily.    Marland Kitchen gabapentin (NEURONTIN) 300 MG capsule Take 300 mg by mouth at bedtime.    . Glycerin-Polysorbate 80 (REFRESH DRY EYE THERAPY OP) Apply to eye.    . losartan (COZAAR) 100 MG tablet TAKE ONE TABLET BY MOUTH EVERY DAY 90 tablet 1  . magnesium oxide (MAG-OX) 400 MG tablet Take 400 mg by mouth daily.    . Multiple Vitamins-Minerals (MULTIVITAMIN PO) Take 1 tablet by mouth daily.    . pantoprazole (PROTONIX) 40 MG tablet TAKE ONE TABLET BY MOUTH EVERY DAY 60 tablet 3  . potassium chloride (K-DUR) 10 MEQ tablet Take 1 tablet (10 mEq total) by mouth daily.  90 tablet 1  . Probiotic Product (FLORAJEN3 PO) Take 1 tablet by mouth.    . ranitidine (ZANTAC) 150 MG tablet Take 150 mg by mouth 2 (two) times daily.     No current facility-administered medications for this visit.     Review of Systems Review of Systems  Constitutional: Negative.  Respiratory: Negative.   Cardiovascular: Negative.     Blood pressure 128/72, pulse (!) 54, resp. rate 12, height 5\' 6"  (1.676 m), weight 131 lb (59.4 kg), SpO2 96 %.  Physical Exam Physical Exam  Constitutional: She is oriented to person, place, and time. She appears well-developed and well-nourished.  HENT:  Mouth/Throat: Oropharynx is clear and moist.  Eyes: Conjunctivae are normal. No scleral icterus.  Neck: Neck supple.  Cardiovascular: Normal rate, regular rhythm and normal heart sounds.  Pulmonary/Chest: Effort normal and breath sounds normal. Right breast exhibits inverted nipple. Right breast exhibits no mass, no nipple discharge, no skin change and no tenderness. Left breast exhibits no inverted nipple, no mass, no nipple discharge, no skin change and no tenderness.  Inverted right nipple, chronic per patient.     Lymphadenopathy:    She has no cervical adenopathy.    She has no axillary adenopathy.  Neurological: She is alert and oriented to person, place, and time.  Skin: Skin is warm and dry.  Psychiatric: Her behavior is normal.    Data Reviewed No pathology from previous breast procedures available.  Screening mammogram of September 25, 2016 as well as diagnostic mammograms of September 17, 2017 and right breast ultrasound of September 17, 2017 reviewed.  Dense breasts. Targeted ultrasound is performed, showing multiple dilated ducts within the subareolar RIGHT breast, containing avascular debris throughout, without internal mass or vascularity. No suspicious solid or cystic mass is identified within the subareolar or periareolar RIGHT breast.    Assessment    Benign ductal  drainage with a moderate amount of duct ectasia on the right.    Plan  Follow up in 3 months with office ultrasound The patient is aware to call back for any questions or concerns, or if she notices a increase in nipple discharge..    HPI, Physical Exam, Assessment and Plan have been scribed under the direction and in the presence of Robert Bellow, MD. Karie Fetch, RN  I have completed the exam and reviewed the above documentation for accuracy and completeness.  I agree with the above.  Haematologist has been used and any errors in dictation or transcription are unintentional.  Hervey Ard, M.D., F.A.C.S.  Forest Gleason [name] 10/21/2017, 7:45 AM

## 2017-10-19 NOTE — Patient Instructions (Signed)
Follow up in 3 months with office ultrasound  The patient is aware to call back for any questions or concerns.

## 2017-11-01 DIAGNOSIS — F4310 Post-traumatic stress disorder, unspecified: Secondary | ICD-10-CM | POA: Diagnosis not present

## 2017-11-01 DIAGNOSIS — F331 Major depressive disorder, recurrent, moderate: Secondary | ICD-10-CM | POA: Diagnosis not present

## 2017-11-01 DIAGNOSIS — F411 Generalized anxiety disorder: Secondary | ICD-10-CM | POA: Diagnosis not present

## 2017-11-02 ENCOUNTER — Encounter: Payer: Self-pay | Admitting: Surgery

## 2017-11-02 ENCOUNTER — Telehealth: Payer: Self-pay | Admitting: *Deleted

## 2017-11-02 ENCOUNTER — Ambulatory Visit (INDEPENDENT_AMBULATORY_CARE_PROVIDER_SITE_OTHER): Payer: Medicare Other | Admitting: Surgery

## 2017-11-02 ENCOUNTER — Other Ambulatory Visit: Payer: Self-pay

## 2017-11-02 VITALS — BP 130/85 | HR 57 | Temp 95.2°F | Resp 13 | Ht 66.0 in | Wt 133.0 lb

## 2017-11-02 DIAGNOSIS — N6452 Nipple discharge: Secondary | ICD-10-CM

## 2017-11-02 DIAGNOSIS — N6310 Unspecified lump in the right breast, unspecified quadrant: Secondary | ICD-10-CM

## 2017-11-02 NOTE — Patient Instructions (Addendum)
Patient needs to be scheduled for a breast US for right breast mass. Schedule patinet with Dr. Rosana Hoes to follow up with results.

## 2017-11-02 NOTE — Telephone Encounter (Signed)
appointment with Dr Rosana Hoes 2:30, pt agrees

## 2017-11-02 NOTE — Telephone Encounter (Signed)
Patient called stating that right breast feels full, with pain and redness from her armpit to her breast, started about 2 days ago. Patient has not had fever but she stated that she feels like her breast has "fever" in it. She is a patient of Dr.Byrnetts but willing to see another physician if she needs to be seen this week

## 2017-11-02 NOTE — Progress Notes (Signed)
Surgical Clinic Progress/Follow-up Note   HPI:  72 y.o. Female presents to clinic for follow-up evaluation of Right breast abnormality. Patient reports she was evaluated by Dr. Bary Castilla 2 weeks ago for clear-yellow Right nipple discharge, which has since decreased and ceased. However, over the past 3 days, she reports increased Right breast tenderness with faint pink "blush" of her overlying skin and what she describes as a "fever in [her] breast". She does not perform self-exams and says it's possible she was trying to open a jar and hit her breast (attributed to difficulty opening jars and similar associated with osteoarthritis). She otherwise denies any fever/chills, N/V, CP, or SOB.  Review of Systems:  Constitutional: denies any other weight loss, fever, chills, or sweats  Eyes: denies any other vision changes, history of eye injury  ENT: denies sore throat, hearing problems  Respiratory: denies shortness of breath, wheezing  Cardiovascular: denies chest pain, palpitations Breasts: mass, pain, and nipple discharge as per interval history Gastrointestinal: denies abdominal pain, N/V, or diarrhea Musculoskeletal: denies any other joint pains or cramps  Skin: Denies any other rashes or skin discolorations  Neurological: denies any other headache, dizziness, weakness  Psychiatric: denies any other depression, anxiety  All other review of systems: otherwise negative   Vital Signs:  BP 130/85   Pulse (!) 57   Temp (!) 95.2 F (35.1 C) (Temporal)   Resp 13   Ht 5\' 6"  (1.676 m)   Wt 133 lb (60.3 kg)   SpO2 97%   BMI 21.47 kg/m    Physical Exam:  Constitutional:  -- Normal body habitus  -- Awake, alert, and oriented x3  Eyes:  -- Pupils equally round and reactive to light  -- No scleral icterus  Ear, nose, throat:  -- No jugular venous distension  -- No nasal drainage, bleeding Pulmonary:  -- No crackles -- Equal breath sounds bilaterally -- Breathing non-labored at  rest Cardiovascular:  -- S1, S2 present  -- No pericardial rubs Breasts: -- Firm, non-tender 4 cm wide x 3 cm tall easily palpable Right breast mass with nipple inversion (reported in chart as chronic since at least 2016) without any appreciable nipple discharge and with very faint overlying skin blush without any fluctuance or drainage Gastrointestinal:  -- Soft, nontender, non-distended, no guarding/rebound  -- No abdominal masses appreciated, pulsatile or otherwise  Musculoskeletal / Integumentary:  -- Wounds or skin discoloration: None appreciated except as described above (Breasts)  -- Extremities: B/L UE and LE FROM, hands and feet warm, no edema  Neurologic:  -- Motor function: intact and symmetric  -- Sensation: intact and symmetric   Laboratory studies:  CBC Latest Ref Rng & Units 07/30/2017 05/26/2016 05/14/2015  WBC 4.0 - 10.5 K/uL 6.2 6.9 -  Hemoglobin 12.0 - 15.0 g/dL 14.2 14.2 10.6(L)  Hematocrit 36.0 - 46.0 % 41.9 41.9 -  Platelets 150.0 - 400.0 K/uL 255.0 302.0 -   CMP Latest Ref Rng & Units 07/30/2017 03/30/2017 10/15/2016  Glucose 70 - 99 mg/dL 93 87 97  BUN 6 - 23 mg/dL 18 23 15   Creatinine 0.40 - 1.20 mg/dL 0.63 0.63 0.69  Sodium 135 - 145 mEq/L 143 142 142  Potassium 3.5 - 5.1 mEq/L 3.8 3.5 3.7  Chloride 96 - 112 mEq/L 102 100 101  CO2 19 - 32 mEq/L 35(H) 37(H) 34(H)  Calcium 8.4 - 10.5 mg/dL 10.2 10.3 10.0  Total Protein 6.0 - 8.3 g/dL 7.0 6.9 6.8  Total Bilirubin 0.2 - 1.2 mg/dL 0.9  0.8 0.5  Alkaline Phos 39 - 117 U/L 54 56 53  AST 0 - 37 U/L 16 19 18   ALT 0 - 35 U/L 22 25 23    Imaging:  Right Breast Mammography and Limited Right Breast and Axillary Ultrasound (09/17/2017) no dominant masses, suspicious calcifications or secondary signs of malignancy identified within either breast. Specifically, there is no mammographic abnormality within the subareolar RIGHT breast corresponding to the area of clinical concern. There is RIGHT nipple inversion which is  similar in appearance to previous studies dating back to 2016.  Targeted ultrasound shows multiple dilated ducts within the  subareolar RIGHT breast, containing avascular debris throughout,  without internal mass or vascularity. No suspicious solid or cystic  mass is identified within subareolar or periareolar RIGHT breast.  1. No evidence of malignancy within either breast. 2. Benign duct ectasia within the subareolar RIGHT breast, containing avascular debris, a possible cause for patient's three-week history of brownish nipple discharge.  Assessment:  72 y.o. yo Female with a problem list including...  Patient Active Problem List   Diagnosis Date Noted  . Discharge from right nipple 09/16/2017  . Numbness and tingling in both hands 07/14/2017  . Osteoporosis, post-menopausal 07/14/2017  . Bradycardia 01/11/2017  . Hypotension due to drugs 01/11/2017  . Varicose veins of both lower extremities with pain 12/20/2016  . Low back pain 07/14/2015  . Adenomyosis 06/27/2015  . Status post vaginal hysterectomy 05/15/2015  . Uterine procidentia 05/13/2015  . Vaginal atrophy 03/27/2015  . Light headedness 03/10/2015  . DDD (degenerative disc disease), lumbar 12/17/2014  . Total knee replacement status 07/18/2014  . Pre-op evaluation 07/16/2014  . Health care maintenance 05/21/2014  . Bilateral foot pain 05/15/2014  . Neuropathy 03/18/2014  . Joint pain 01/28/2014  . Gastritis 10/03/2013  . History of colonic polyps 10/03/2013  . Leg length discrepancy 09/27/2013  . Right hip pain 09/12/2013  . Right knee pain 08/24/2013  . IDA (iron deficiency anemia) 08/17/2013  . Osteoporosis 06/03/2013  . Anemia 06/03/2013  . SOB (shortness of breath) 05/06/2013  . Hyperparathyroidism, primary ([place]) 08/03/2012  . Hypertension 02/01/2012  . Hypercholesterolemia 02/01/2012  . Depression 02/01/2012  . Anxiety 02/01/2012  . Chronic insomnia 02/01/2012  . COPD (chronic obstructive pulmonary  disease) ([place]) 05/26/2011    presents to clinic for evaluation of new Right breast mass in context of uncertain history of trauma, most likely to be hematoma or less likely abscess, considering recent breast exam and prior mammogram and focal ultrasound just over 1 month ago.  Plan:   - currently no indication for antibiotics or surgical intervention  - discussed with patient differential diagnoses, including hematoma > abscess, neoplasm less likely considering acute onset and recent breast mammogram + ultrasound  - considering interval development of new Right breast mass, will check ultrasound  - return to clinic upon completion of Right breast ultrasound  - instructed to call office if any questions or concerns  All of the above recommendations were discussed with the patient, and all of patient's questions were answered to her expressed satisfaction.  -- Marilynne Drivers Rosana Hoes, MD, [place]: [facility] - Partnering for exceptional care. Office: [phone]

## 2017-11-07 ENCOUNTER — Encounter: Payer: Self-pay | Admitting: Surgery

## 2017-11-09 ENCOUNTER — Other Ambulatory Visit: Payer: Self-pay

## 2017-11-09 ENCOUNTER — Ambulatory Visit: Payer: No Typology Code available for payment source | Admitting: Psychiatry

## 2017-11-09 ENCOUNTER — Encounter: Payer: Self-pay | Admitting: Psychiatry

## 2017-11-09 VITALS — BP 133/80 | HR 50 | Temp 97.7°F | Wt 132.4 lb

## 2017-11-09 DIAGNOSIS — F4310 Post-traumatic stress disorder, unspecified: Secondary | ICD-10-CM | POA: Diagnosis not present

## 2017-11-09 DIAGNOSIS — F411 Generalized anxiety disorder: Secondary | ICD-10-CM | POA: Diagnosis not present

## 2017-11-09 DIAGNOSIS — F331 Major depressive disorder, recurrent, moderate: Secondary | ICD-10-CM | POA: Diagnosis not present

## 2017-11-09 MED ORDER — MIRTAZAPINE 7.5 MG PO TABS: 7.5000 mg | ORAL_TABLET | Freq: Every day | ORAL | 0 refills | Status: DC

## 2017-11-09 NOTE — Progress Notes (Signed)
[facility] Progress Note  11/09/2017 5:28 PM [name]  MRN:  [id number]  Chief Complaint: ' I am here for follow up." Chief Complaint    Follow-up     HPI: [name] is a 72 year old Caucasian female, lives in [place], divorced, has a history of MDD, GAD, PTSD, multiple surgeries, hypertension, hyperlipidemia, history of tumor of the pituitary gland, presented to the clinic today for a follow-up visit.  Patient today reports she continues to have some depressive symptoms like sadness, tearfulness, low energy, anhedonia, memory problems and so on.  Patient has started seeing Ms. Miguel Dibble for psychotherapy which has been helpful.  Reports she is motivated to continue psychotherapy.  Patient reports sleep as restless.  She denies any suicidality.  Patient denies any homicidality.  Patient denies any perceptual disturbances.  Patient continues to be interested in [place] however reports she has difficulty with transportation.  She reports she wants to think about it and will Biochemist, clinical know.  Genesight testing results were reviewed and discussed with patient today.  Discussed starting patient on mirtazapine.  She agreed with plan.     Visit Diagnosis:    ICD-10-CM   1. MDD (major depressive disorder), recurrent episode, moderate (HCC) F33.1   2. PTSD (post-traumatic stress disorder) F43.10   3. GAD (generalized anxiety disorder) F41.1     Past Psychiatric History: Reviewed past psychiatric history from my progress note on 07/14/2017.  Past trials of Zoloft, Wellbutrin, Effexor, Celexa, BuSpar, trazodone, Abilify.  Past Medical History:  Past Medical History:  Diagnosis Date  . Anemia   . Anxiety   . Arthritis   . Burning feet syndrome   . Chronic insomnia   . Complication of anesthesia    "AFFECTS MY MEMORY"  . COPD (chronic obstructive pulmonary disease) ([place])    documented in outside records  . Depression   . Dysrhythmia    PALPITATIONS   . Edema    MILD ANKLE   . GERD (gastroesophageal reflux disease)   . Heart murmur   . History of stomach ulcers   . HOH (hard of hearing)    AIDS  . Hypercholesterolemia   . Hyperparathyroidism, primary ([place])   . Hypertension   . Neuropathy   . Osteoporosis   . Palpitations   . Scoliosis   . Shortness of breath    with exertion  . Tingling    both hands    Past Surgical History:  Procedure Laterality Date  . ANTERIOR CERVICAL DECOMP/DISCECTOMY FUSION  10/28/2011   Procedure: ANTERIOR CERVICAL DECOMPRESSION/DISCECTOMY FUSION 2 LEVELS;  Surgeon: Elaina Hoops, MD;  Location: [facility];  Service: Neurosurgery;  Laterality: N/A;  . BREAST BIOPSY Right 1992   neg  . BREAST SURGERY     RT BREAST CYST REMOVED 15 YRS AGO   . CATARACT EXTRACTION W/PHACO Right 10/30/2014   Procedure: CATARACT EXTRACTION PHACO AND INTRAOCULAR LENS PLACEMENT (IOC);  Surgeon: Birder Robson, MD;  Location: [facility];  Service: Ophthalmology;  Laterality: Right;  US0:40 AP18.2 CDE7.42  [place] lot #[id number]  . CATARACT EXTRACTION W/PHACO Left 11/27/2014   Procedure: CATARACT EXTRACTION PHACO AND INTRAOCULAR LENS PLACEMENT (IOC);  Surgeon: Birder Robson, MD;  Location: [facility];  Service: Ophthalmology;  Laterality: Left;  [place] 00:36.9 AP%: 18.9 CDE: 6.96  [place] lot #  [id number]  . CYSTOCELE REPAIR N/A 05/13/2015   Procedure: ANTERIOR REPAIR (CYSTOCELE);  Surgeon: Brayton Mars, MD;  Location: [facility];  Service: Gynecology;  Laterality: N/A;  .  CYSTOSCOPY  05/13/2015   Procedure: CYSTOSCOPY;  Surgeon: Brayton Mars, MD;  Location: [facility];  Service: Gynecology;;  . DILATION AND CURETTAGE OF UTERUS     X2   . JOINT REPLACEMENT     RT HIP  2013  (PARTIAL)   . PARATHYROIDECTOMY Left 08/25/2012   Procedure: PARATHYROIDECTOMY;  Surgeon: Earnstine Regal, MD;  Location: [facility];  Service: General;  Laterality: Left;  . PITUITARY SURGERY     FOR TUMOR 3 YRS   . TOTAL KNEE ARTHROPLASTY Right 07/18/2014   Procedure: TOTAL  KNEE ARTHROPLASTY;  Surgeon: Dereck Leep, MD;  Location: [facility];  Service: Orthopedics;  Laterality: Right;  Marland Kitchen VAGINAL HYSTERECTOMY Bilateral 05/13/2015   Procedure: HYSTERECTOMY VAGINAL WITH BILATERAL SALPINGO OOPHERECTOMY;  Surgeon: Brayton Mars, MD;  Location: [facility];  Service: Gynecology;  Laterality: Bilateral;  . VAGINAL HYSTERECTOMY     bso ant repair    Family Psychiatric History: Reviewed family psychiatric history from my progress note on 07/14/2017  Family History:  Family History  Problem Relation Age of Onset  . Glaucoma Mother   . Hypertension Mother   . Thyroid disease Mother   . Stroke Father   . Hypertension Father   . Bipolar disorder Sister   . Breast cancer Paternal Aunt        60's  . Cancer Neg Hx   . Diabetes Neg Hx     Social History: Reviewed social history from my progress note on 07/14/2017 Social History   Socioeconomic History  . Marital status: Divorced    Spouse name: Not on file  . Number of children: 1  . Years of education: Not on file  . Highest education level: Some college, no degree  Occupational History  . Occupation: retired  Scientific laboratory technician  . Financial resource strain: Hard  . Food insecurity:    Worry: Often true    Inability: Often true  . Transportation needs:    Medical: No    Non-medical: No  Tobacco Use  . Smoking status: Former Smoker    Packs/day: 1.00    Years: 35.00    Pack years: 35.00    Types: Cigarettes    Last attempt to quit: 02/10/2008    Years since quitting: 9.7  . Smokeless tobacco: Never Used  Substance and Sexual Activity  . Alcohol use: Yes    Alcohol/week: 0.0 standard drinks    Comment: rare  . Drug use: No  . Sexual activity: Never    Birth control/protection: Post-menopausal  Lifestyle  . Physical activity:    Days per week: 0 days    Minutes per session: 0 min  . Stress: Rather much  Relationships  . Social connections:    Talks on phone: Not on file    Gets together: Not on  file    Attends religious service: Never    Active member of club or organization: No    Attends meetings of clubs or organizations: Never    Relationship status: Divorced  Other Topics Concern  . Not on file  Social History Narrative  . Not on file    Allergies:  Allergies  Allergen Reactions  . Sulfamethoxazole-Trimethoprim Other (See Comments)    GI Upset  . Citalopram Hives  . Tape Itching    Metabolic Disorder Labs: Lab Results  Component Value Date   HGBA1C 5.8 05/26/2016   No results found for: PROLACTIN Lab Results  Component Value Date   CHOL 199 07/30/2017  TRIG 108.0 07/30/2017   HDL 79.40 07/30/2017   CHOLHDL 3 07/30/2017   VLDL 21.6 07/30/2017   LDLCALC 98 07/30/2017   LDLCALC 84 03/30/2017   Lab Results  Component Value Date   TSH 1.55 10/15/2016   TSH 1.11 08/08/2015    Therapeutic Level Labs: No results found for: LITHIUM No results found for: VALPROATE No components found for:  CBMZ  Current Medications: Current Outpatient Medications  Medication Sig Dispense Refill  . acetaminophen (TYLENOL) 500 MG tablet Take 1,000 mg by mouth every 6 (six) hours as needed for fever.     . Alpha-Lipoic Acid 600 MG CAPS Take by mouth.    Marland Kitchen amLODipine (NORVASC) 5 MG tablet Take 1 tablet (5 mg total) by mouth daily. 90 tablet 1  . aspirin EC 81 MG tablet Take 81 mg by mouth daily.    Marland Kitchen atorvastatin (LIPITOR) 40 MG tablet Take 1 tablet (40 mg total) by mouth daily. 90 tablet 1  . Biotin (BIOTIN MAXIMUM STRENGTH) 10 MG TABS Take 1 tablet by mouth daily.    . chlorthalidone (HYGROTON) 25 MG tablet TAKE ONE TABLET EVERY DAY 90 tablet 1  . cholecalciferol (VITAMIN D) 1000 UNITS tablet Take 1,000 Units by mouth daily.     Marland Kitchen docusate sodium (COLACE) 100 MG capsule Take 100 mg by mouth 2 (two) times daily as needed for mild constipation.     . ferrous sulfate 325 (65 FE) MG tablet Take 325 mg by mouth daily.    Marland Kitchen gabapentin (NEURONTIN) 300 MG capsule Take 300 mg by  mouth at bedtime.    . Glycerin-Polysorbate 80 (REFRESH DRY EYE THERAPY OP) Apply to eye.    . losartan (COZAAR) 100 MG tablet TAKE ONE TABLET BY MOUTH EVERY DAY 90 tablet 1  . magnesium oxide (MAG-OX) 400 MG tablet Take 400 mg by mouth daily.    . Multiple Vitamins-Minerals (MULTIVITAMIN PO) Take 1 tablet by mouth daily.    . pantoprazole (PROTONIX) 40 MG tablet TAKE ONE TABLET BY MOUTH EVERY DAY 60 tablet 3  . potassium chloride (K-DUR) 10 MEQ tablet Take 1 tablet (10 mEq total) by mouth daily. 90 tablet 1  . Probiotic Product (FLORAJEN3 PO) Take 1 tablet by mouth.    . ranitidine (ZANTAC) 150 MG tablet Take 150 mg by mouth 2 (two) times daily.    . mirtazapine (REMERON) 7.5 MG tablet Take 1 tablet (7.5 mg total) by mouth at bedtime. 30 tablet 0   No current facility-administered medications for this visit.      Musculoskeletal: Strength & Muscle Tone: within normal limits Gait & Station: normal Patient leans: N/A  Psychiatric Specialty Exam: Review of Systems  Psychiatric/Behavioral: Positive for depression. The patient has insomnia.   All other systems reviewed and are negative.   Blood pressure 133/80, pulse (!) 50, temperature 97.7 F (36.5 C), temperature source Oral, weight 132 lb 6.4 oz (60.1 kg).Body mass index is 21.37 kg/m.  General Appearance: Casual  Eye Contact:  Fair  Speech:  Slow some blocking  Volume:  Normal  Mood:  Dysphoric  Affect:  Congruent  Thought Process:  Goal Directed and Descriptions of Associations: Intact  Orientation:  Full (Time, Place, and Person)  Thought Content: Logical   Suicidal Thoughts:  No  Homicidal Thoughts:  No  Memory:  Immediate;   Fair Recent;   Fair Remote;   Fair  Judgement:  Fair  Insight:  Fair  Psychomotor Activity:  Normal  Concentration:  Concentration: Fair  and Attention Span: Fair  Recall:  [employer] of Knowledge: Fair  Language: Fair  Akathisia:  No  Handed:  Right  AIMS (if indicated): na  Assets:   Communication Skills Desire for Improvement Social Support  ADL's:  Intact  Cognition: WNL  Sleep:  Poor   Screenings: Mini-Mental     Office Visit from 06/18/2014 in [place]  Total Score (max 30 points )  30    PHQ2-9     Clinical Support from 02/05/2017 in [place] from 01/29/2016 in [facility] Office Visit from 10/11/2015 in [place] Office Visit from 06/18/2014 in [place] [facility] Office Visit from 05/17/2014 in [facility]  PHQ-2 Total Score  1  2  3  2   0  PHQ-9 Total Score  3  6  14  8   -       Assessment and Plan: Fabian is a 72 year old Caucasian female, divorced, lives in [place], has a history of depression, anxiety, PTSD, hyperlipidemia, multiple hip surgeries, knee surgeries, history of pituitary gland tumor, history of headaches, presented to the clinic today for a follow-up visit.  Patient with several psychosocial stressors including relationship struggles, limited social support system, being divorced and so on.  Patient is biologically predisposed given her history of trauma, history of mental health problems in her family.  Patient today reports she is interested in medications.  Her medication choices were made based on genesight testing results that were reviewed with her.  Pt continues to benefit from psychotherapy with Ms. Miguel Dibble.  Plan as noted below Plan  MDD Start Remeron 7.5 mg p.o. nightly Continue psychotherapy with Ms. Miguel Dibble.  GAD Continue CBT  For insomnia Remeron will help.  Rule out cognitive disorder Patient with word finding difficulty unknown if this is due to her depressive symptoms.  MOCA per  progress note Dr. Per Dr. Daron Offer on 08/09/2017-21 out of 30 Patient has an appointment with her neurologist Dr. Manuella Ghazi in October.  Follow-up in clinic in 2 weeks .  More than 50 % of the time was spent  for psychoeducation and supportive psychotherapy and care coordination.  This note was generated in part or whole with voice recognition software. Voice recognition is usually quite accurate but there are transcription errors that can and very often do occur. I apologize for any typographical errors that were not detected and corrected.       Ursula Alert, MD 11/10/2017, 9:12 AM

## 2017-11-09 NOTE — Patient Instructions (Signed)
Mirtazapine tablets What is this medicine? MIRTAZAPINE (mir TAZ a peen) is used to treat depression. This medicine may be used for other purposes; ask your health care provider or pharmacist if you have questions. COMMON BRAND NAME(S): Remeron What should I tell my health care provider before I take this medicine? They need to know if you have any of these conditions: -bipolar disorder -glaucoma -kidney disease -liver disease -suicidal thoughts -an unusual or allergic reaction to mirtazapine, other medicines, foods, dyes, or preservatives -pregnant or trying to get pregnant -breast-feeding How should I use this medicine? Take this medicine by mouth with a glass of water. Follow the directions on the prescription label. Take your medicine at regular intervals. Do not take your medicine more often than directed. Do not stop taking this medicine suddenly except upon the advice of your doctor. Stopping this medicine too quickly may cause serious side effects or your condition may worsen. A special MedGuide will be given to you by the pharmacist with each prescription and refill. Be sure to read this information carefully each time. Talk to your pediatrician regarding the use of this medicine in children. Special care may be needed. Overdosage: If you think you have taken too much of this medicine contact a poison control center or emergency room at once. NOTE: This medicine is only for you. Do not share this medicine with others. What if I miss a dose? If you miss a dose, take it as soon as you can. If it is almost time for your next dose, take only that dose. Do not take double or extra doses. What may interact with this medicine? Do not take this medicine with any of the following medications: -linezolid -MAOIs like Carbex, Eldepryl, Marplan, Nardil, and Parnate -methylene blue (injected into a vein) This medicine may also interact with the following medications: -alcohol -antiviral  medicines for HIV or AIDS -certain medicines that treat or prevent blood clots like warfarin -certain medicines for depression, anxiety, or psychotic disturbances -certain medicines for fungal infections like ketoconazole and itraconazole -certain medicines for migraine headache like almotriptan, eletriptan, frovatriptan, naratriptan, rizatriptan, sumatriptan, zolmitriptan -certain medicines for seizures like carbamazepine or phenytoin -certain medicines for sleep -cimetidine -erythromycin -fentanyl -lithium -medicines for blood pressure -nefazodone -rasagiline -rifampin -supplements like St. John's wort, kava kava, valerian -tramadol -tryptophan This list may not describe all possible interactions. Give your health care provider a list of all the medicines, herbs, non-prescription drugs, or dietary supplements you use. Also tell them if you smoke, drink alcohol, or use illegal drugs. Some items may interact with your medicine. What should I watch for while using this medicine? Tell your doctor if your symptoms do not get better or if they get worse. Visit your doctor or health care professional for regular checks on your progress. Because it may take several weeks to see the full effects of this medicine, it is important to continue your treatment as prescribed by your doctor. Patients and their families should watch out for new or worsening thoughts of suicide or depression. Also watch out for sudden changes in feelings such as feeling anxious, agitated, panicky, irritable, hostile, aggressive, impulsive, severely restless, overly excited and hyperactive, or not being able to sleep. If this happens, especially at the beginning of treatment or after a change in dose, call your health care professional. You may get drowsy or dizzy. Do not drive, use machinery, or do anything that needs mental alertness until you know how this medicine affects you. Do not   stand or sit up quickly, especially if  you are an older patient. This reduces the risk of dizzy or fainting spells. Alcohol may interfere with the effect of this medicine. Avoid alcoholic drinks. This medicine may cause dry eyes and blurred vision. If you wear contact lenses you may feel some discomfort. Lubricating drops may help. See your eye doctor if the problem does not go away or is severe. Your mouth may get dry. Chewing sugarless gum or sucking hard candy, and drinking plenty of water may help. Contact your doctor if the problem does not go away or is severe. What side effects may I notice from receiving this medicine? Side effects that you should report to your doctor or health care professional as soon as possible: -allergic reactions like skin rash, itching or hives, swelling of the face, lips, or tongue -anxious -changes in vision -chest pain -confusion -elevated mood, decreased need for sleep, racing thoughts, impulsive behavior -eye pain -fast, irregular heartbeat -feeling faint or lightheaded, falls -feeling agitated, angry, or irritable -fever or chills, sore throat -hallucination, loss of contact with reality -loss of balance or coordination -mouth sores -redness, blistering, peeling or loosening of the skin, including inside the mouth -restlessness, pacing, inability to keep still -seizures -stiff muscles -suicidal thoughts or other mood changes -trouble passing urine or change in the amount of urine -trouble sleeping -unusual bleeding or bruising -unusually weak or tired -vomiting Side effects that usually do not require medical attention (report to your doctor or health care professional if they continue or are bothersome): -change in appetite -constipation -dizziness -dry mouth -muscle aches or pains -nausea -tired -weight gain This list may not describe all possible side effects. Call your doctor for medical advice about side effects. You may report side effects to FDA at 1-800-FDA-1088. Where  should I keep my medicine? Keep out of the reach of children. Store at room temperature between 15 and 30 degrees C (59 and 86 degrees F) Protect from light and moisture. Throw away any unused medicine after the expiration date. NOTE: This sheet is a summary. It may not cover all possible information. If you have questions about this medicine, talk to your doctor, pharmacist, or health care provider.  2018 Elsevier/Gold Standard (2015-06-27 17:30:45)  

## 2017-11-10 ENCOUNTER — Ambulatory Visit
Admission: RE | Admit: 2017-11-10 | Discharge: 2017-11-10 | Disposition: A | Discharge status: Home / Self Care | Payer: Medicare Other | Source: Ambulatory Visit | Attending: Surgery | Admitting: Surgery

## 2017-11-10 ENCOUNTER — Encounter: Payer: Self-pay | Admitting: Psychiatry

## 2017-11-10 ENCOUNTER — Other Ambulatory Visit: Payer: Self-pay | Admitting: Surgery

## 2017-11-10 DIAGNOSIS — N6452 Nipple discharge: Secondary | ICD-10-CM

## 2017-11-10 DIAGNOSIS — N6311 Unspecified lump in the right breast, upper outer quadrant: Secondary | ICD-10-CM | POA: Diagnosis not present

## 2017-11-10 DIAGNOSIS — R928 Other abnormal and inconclusive findings on diagnostic imaging of breast: Secondary | ICD-10-CM

## 2017-11-10 DIAGNOSIS — N6310 Unspecified lump in the right breast, unspecified quadrant: Secondary | ICD-10-CM

## 2017-11-10 DIAGNOSIS — R922 Inconclusive mammogram: Secondary | ICD-10-CM | POA: Diagnosis not present

## 2017-11-11 ENCOUNTER — Ambulatory Visit (INDEPENDENT_AMBULATORY_CARE_PROVIDER_SITE_OTHER): Payer: Medicare Other

## 2017-11-11 ENCOUNTER — Encounter: Payer: Self-pay | Admitting: General Surgery

## 2017-11-11 ENCOUNTER — Ambulatory Visit: Payer: Medicare Other | Admitting: General Surgery

## 2017-11-11 VITALS — BP 108/70 | HR 64 | Resp 13 | Ht 66.0 in | Wt 130.0 lb

## 2017-11-11 DIAGNOSIS — N6011 Diffuse cystic mastopathy of right breast: Secondary | ICD-10-CM | POA: Diagnosis not present

## 2017-11-11 DIAGNOSIS — N6310 Unspecified lump in the right breast, unspecified quadrant: Secondary | ICD-10-CM | POA: Insufficient documentation

## 2017-11-11 DIAGNOSIS — N6311 Unspecified lump in the right breast, upper outer quadrant: Secondary | ICD-10-CM

## 2017-11-11 NOTE — Progress Notes (Signed)
Patient ID: [name], female   DOB: 1945-03-20, 72 y.o.   MRN: [id number]  Chief Complaint  Patient presents with  . Follow-up    HPI [name] is a 72 y.o. female here for left breast soreness and follow up from right mammogram done on 11/10/17. She reports that she feels something in her right breast and it does feel warm.  HPI  Past Medical History:  Diagnosis Date  . Anemia   . Anxiety   . Arthritis   . Burning feet syndrome   . Chronic insomnia   . Complication of anesthesia    "AFFECTS MY MEMORY"  . COPD (chronic obstructive pulmonary disease) ([place])    documented in outside records  . Depression   . Dysrhythmia    PALPITATIONS   . Edema    MILD ANKLE  . GERD (gastroesophageal reflux disease)   . Heart murmur   . History of stomach ulcers   . HOH (hard of hearing)    AIDS  . Hypercholesterolemia   . Hyperparathyroidism, primary ([place])   . Hypertension   . Neuropathy   . Osteoporosis   . Palpitations   . Scoliosis   . Shortness of breath    with exertion  . Tingling    both hands    Past Surgical History:  Procedure Laterality Date  . ANTERIOR CERVICAL DECOMP/DISCECTOMY FUSION  10/28/2011   Procedure: ANTERIOR CERVICAL DECOMPRESSION/DISCECTOMY FUSION 2 LEVELS;  Surgeon: Elaina Hoops, MD;  Location: [facility];  Service: Neurosurgery;  Laterality: N/A;  . BREAST BIOPSY Right 1992   neg  . BREAST SURGERY     RT BREAST CYST REMOVED 15 YRS AGO   . CATARACT EXTRACTION W/PHACO Right 10/30/2014   Procedure: CATARACT EXTRACTION PHACO AND INTRAOCULAR LENS PLACEMENT (IOC);  Surgeon: Birder Robson, MD;  Location: [facility];  Service: Ophthalmology;  Laterality: Right;  US0:40 AP18.2 CDE7.42  [place] lot #[id number]  . CATARACT EXTRACTION W/PHACO Left 11/27/2014   Procedure: CATARACT EXTRACTION PHACO AND INTRAOCULAR LENS PLACEMENT (IOC);  Surgeon: Birder Robson, MD;  Location: [facility];  Service: Ophthalmology;  Laterality: Left;  [place] 00:36.9 AP%:  18.9 CDE: 6.96  [place] lot #  [id number]  . CYSTOCELE REPAIR N/A 05/13/2015   Procedure: ANTERIOR REPAIR (CYSTOCELE);  Surgeon: Brayton Mars, MD;  Location: [facility];  Service: Gynecology;  Laterality: N/A;  . CYSTOSCOPY  05/13/2015   Procedure: CYSTOSCOPY;  Surgeon: Brayton Mars, MD;  Location: [facility];  Service: Gynecology;;  . DILATION AND CURETTAGE OF UTERUS     X2   . JOINT REPLACEMENT     RT HIP  2013  (PARTIAL)   . PARATHYROIDECTOMY Left 08/25/2012   Procedure: PARATHYROIDECTOMY;  Surgeon: Earnstine Regal, MD;  Location: [facility];  Service: General;  Laterality: Left;  . PITUITARY SURGERY     FOR TUMOR 3 YRS   . TOTAL KNEE ARTHROPLASTY Right 07/18/2014   Procedure: TOTAL KNEE ARTHROPLASTY;  Surgeon: Dereck Leep, MD;  Location: [facility];  Service: Orthopedics;  Laterality: Right;  Marland Kitchen VAGINAL HYSTERECTOMY Bilateral 05/13/2015   Procedure: HYSTERECTOMY VAGINAL WITH BILATERAL SALPINGO OOPHERECTOMY;  Surgeon: Brayton Mars, MD;  Location: [facility];  Service: Gynecology;  Laterality: Bilateral;  . VAGINAL HYSTERECTOMY     bso ant repair    Family History  Problem Relation Age of Onset  . Glaucoma Mother   . Hypertension Mother   . Thyroid disease Mother   . Stroke Father   .  Hypertension Father   . Bipolar disorder Sister   . Breast cancer Paternal Aunt        60's  . Cancer Neg Hx   . Diabetes Neg Hx     Social History Social History   Tobacco Use  . Smoking status: Former Smoker    Packs/day: 1.00    Years: 35.00    Pack years: 35.00    Types: Cigarettes    Last attempt to quit: 02/10/2008    Years since quitting: 9.7  . Smokeless tobacco: Never Used  Substance Use Topics  . Alcohol use: Yes    Alcohol/week: 0.0 standard drinks    Comment: rare  . Drug use: No    Allergies  Allergen Reactions  . Sulfamethoxazole-Trimethoprim Other (See Comments)    GI Upset  . Citalopram Hives  . Tape Itching    Current Outpatient Medications  Medication  Sig Dispense Refill  . acetaminophen (TYLENOL) 500 MG tablet Take 1,000 mg by mouth every 6 (six) hours as needed for fever.     . Alpha-Lipoic Acid 600 MG CAPS Take by mouth.    Marland Kitchen amLODipine (NORVASC) 5 MG tablet Take 1 tablet (5 mg total) by mouth daily. 90 tablet 1  . aspirin EC 81 MG tablet Take 81 mg by mouth daily.    Marland Kitchen atorvastatin (LIPITOR) 40 MG tablet Take 1 tablet (40 mg total) by mouth daily. 90 tablet 1  . Biotin (BIOTIN MAXIMUM STRENGTH) 10 MG TABS Take 1 tablet by mouth daily.    . chlorthalidone (HYGROTON) 25 MG tablet TAKE ONE TABLET EVERY DAY 90 tablet 1  . cholecalciferol (VITAMIN D) 1000 UNITS tablet Take 1,000 Units by mouth daily.     Marland Kitchen docusate sodium (COLACE) 100 MG capsule Take 100 mg by mouth 2 (two) times daily as needed for mild constipation.     . ferrous sulfate 325 (65 FE) MG tablet Take 325 mg by mouth daily.    Marland Kitchen gabapentin (NEURONTIN) 300 MG capsule Take 300 mg by mouth at bedtime.    . Glycerin-Polysorbate 80 (REFRESH DRY EYE THERAPY OP) Apply to eye.    . losartan (COZAAR) 100 MG tablet TAKE ONE TABLET BY MOUTH EVERY DAY 90 tablet 1  . magnesium oxide (MAG-OX) 400 MG tablet Take 400 mg by mouth daily.    . mirtazapine (REMERON) 7.5 MG tablet Take 1 tablet (7.5 mg total) by mouth at bedtime. 30 tablet 0  . Multiple Vitamins-Minerals (MULTIVITAMIN PO) Take 1 tablet by mouth daily.    . pantoprazole (PROTONIX) 40 MG tablet TAKE ONE TABLET BY MOUTH EVERY DAY 60 tablet 3  . potassium chloride (K-DUR) 10 MEQ tablet Take 1 tablet (10 mEq total) by mouth daily. 90 tablet 1  . Probiotic Product (FLORAJEN3 PO) Take 1 tablet by mouth.    . ranitidine (ZANTAC) 150 MG tablet Take 150 mg by mouth 2 (two) times daily.     No current facility-administered medications for this visit.     Review of Systems Review of Systems  Constitutional: Negative.   Respiratory: Negative.   Cardiovascular: Negative.     Blood pressure 108/70, pulse 64, resp. rate 13, height 5\' 6"   (1.676 m), weight 130 lb (59 kg).  Physical Exam Physical Exam  Constitutional: She is oriented to person, place, and time. She appears well-developed and well-nourished.  Cardiovascular: Normal rate and regular rhythm.  Pulmonary/Chest: Effort normal and breath sounds normal. Right breast exhibits inverted nipple (cronic). Right breast exhibits no  mass, no nipple discharge, no skin change and no tenderness. Left breast exhibits no inverted nipple, no mass, no nipple discharge, no skin change and no tenderness.  Thickening in the right breast.     Lymphadenopathy:    She has no cervical adenopathy.    She has no axillary adenopathy.       Right: No supraclavicular adenopathy present.       Left: No supraclavicular adenopathy present.  Neurological: She is alert and oriented to person, place, and time.  Skin: Skin is warm and dry.  Psychiatric: She has a normal mood and affect.    Data Reviewed September 17, 2017 mammogram and ultrasound as well as November 10, 2017 mammogram and ultrasound reviewed.  Ultrasound examination of the upper outer quadrant of the right breast (images labeled left) showed a heterogeneous area of tissue density impinging on the overlying fat measuring 2.4 x 3.6 x 3.67 cm.  While the recently completed hospital ultrasound had a numerous additional findings which were recommended for biopsy, it was elected to focus on the dominant area on physical exam before chasing down various lymph glands.  The patient was amenable to core biopsy.  BI-RADS-4  10 cc of 0.5% Xylocaine with 0.25% Marcaine with 1-200,000 notes of epinephrine was utilized and well-tolerated.  A 10-gauge Encor device was used and positioned in the midportion of the mass 4 cm from the nipple.  6 core samples were obtained with mild discomfort.  A postbiopsy clip was placed.  Skin defect was closed with benzoin and Steri-Strip.  Estimated blood loss less than 5 cc.  Telfa and Tegaderm dressing applied.  Ice  pack provided.  Assessment    Right upper outer quadrant breast mass, history nipple drainage.    Plan    The patient was instructed in regards to wound care.  She will be contacted when pathology results are available.    HPI, Physical Exam, Assessment and Plan have been scribed under the direction and in the presence of Robert Bellow, MD  Concepcion Living, LPN  I have completed the exam and reviewed the above documentation for accuracy and completeness.  I agree with the above.  Haematologist has been used and any errors in dictation or transcription are unintentional.  Hervey Ard, M.D., F.A.C.S.   Forest Gleason Neely Cecena 11/11/2017, 5:16 PM

## 2017-11-11 NOTE — Patient Instructions (Addendum)
We will call you with your results.    CARE AFTER BREAST BIOPSY  1. Leave the dressing on that your doctor applied after the biopsy. It is waterproof. You may bathe, shower and/or swim. The dressing can be removed in 3 days, you will see small strips of tape against your skin on the incision. Do not remove these strips they will gradually fall off in about 2-3 weeks. You may use an ice pack on and off for the first 12-24 hours for comfort.  2. You may want to use a gauze,cloth or similar protection in your bra to prevent rubbing against your dressing and incision. This is not necessary, but you may feel more comfortable doing so.  3. It is recommended that you wear a bra day and night to give support to the breast. This will prevent the weight of the breast from pulling on the incision.  4. Your breast may feel hard and lumpy under the incision. Do not be alarmed. This is the underlying stitching of tissue. Softening of this tissue will occur in time.  5. You may have a follow up appointment or phone follow up in one week after your biopsy. The office phone number is [phone].  6. You will notice about a week or two after your office visit that the strips of the tape on your incision will begin to loosen. These may then be removed.  7. Report to your doctor any of the following:  * Severe pain not relieved by your pain medication  *Redness of the incision  * Drainage from the incision  *Fever greater than 101 degrees

## 2017-11-18 DIAGNOSIS — C44712 Basal cell carcinoma of skin of right lower limb, including hip: Secondary | ICD-10-CM | POA: Diagnosis not present

## 2017-11-22 ENCOUNTER — Other Ambulatory Visit (INDEPENDENT_AMBULATORY_CARE_PROVIDER_SITE_OTHER): Payer: Medicare Other

## 2017-11-22 DIAGNOSIS — E7800 Pure hypercholesterolemia, unspecified: Secondary | ICD-10-CM | POA: Diagnosis not present

## 2017-11-22 DIAGNOSIS — I10 Essential (primary) hypertension: Secondary | ICD-10-CM

## 2017-11-22 LAB — TSH: TSH: 1.09 u[IU]/mL (ref 0.35–4.50)

## 2017-11-22 LAB — HEPATIC FUNCTION PANEL
ALT: 31 U/L (ref 0–35)
AST: 20 U/L (ref 0–37)
Albumin: 4.3 g/dL (ref 3.5–5.2)
Alkaline Phosphatase: 63 U/L (ref 39–117)
BILIRUBIN DIRECT: 0.1 mg/dL (ref 0.0–0.3)
TOTAL PROTEIN: 6.8 g/dL (ref 6.0–8.3)
Total Bilirubin: 0.8 mg/dL (ref 0.2–1.2)

## 2017-11-22 LAB — LIPID PANEL
CHOL/HDL RATIO: 3
Cholesterol: 167 mg/dL (ref 0–200)
HDL: 66.5 mg/dL (ref >39.00)
LDL CALC: 73 mg/dL (ref 0–99)
NonHDL: 100.2
TRIGLYCERIDES: 135 mg/dL (ref 0.0–149.0)
VLDL: 27 mg/dL (ref 0.0–40.0)

## 2017-11-22 LAB — BASIC METABOLIC PANEL
BUN: 19 mg/dL (ref 6–23)
CALCIUM: 9.8 mg/dL (ref 8.4–10.5)
CHLORIDE: 102 meq/L (ref 96–112)
CO2: 35 meq/L — AB (ref 19–32)
CREATININE: 0.56 mg/dL (ref 0.40–1.20)
GFR: 113.53 mL/min (ref >60.00)
GLUCOSE: 94 mg/dL (ref 70–99)
Potassium: 3.4 mEq/L — ABNORMAL LOW (ref 3.5–5.1)
SODIUM: 142 meq/L (ref 135–145)

## 2017-11-23 ENCOUNTER — Ambulatory Visit: Payer: No Typology Code available for payment source | Admitting: Psychiatry

## 2017-11-23 ENCOUNTER — Other Ambulatory Visit: Payer: Self-pay

## 2017-11-23 ENCOUNTER — Encounter: Payer: Self-pay | Admitting: Psychiatry

## 2017-11-23 VITALS — BP 146/77 | HR 61 | Temp 97.5°F | Wt 131.8 lb

## 2017-11-23 DIAGNOSIS — F411 Generalized anxiety disorder: Secondary | ICD-10-CM | POA: Diagnosis not present

## 2017-11-23 DIAGNOSIS — F4310 Post-traumatic stress disorder, unspecified: Secondary | ICD-10-CM

## 2017-11-23 DIAGNOSIS — F331 Major depressive disorder, recurrent, moderate: Secondary | ICD-10-CM | POA: Diagnosis not present

## 2017-11-23 MED ORDER — BUPROPION HCL 75 MG PO TABS: 75.0000 mg | ORAL_TABLET | Freq: Every morning | ORAL | 0 refills | Status: DC

## 2017-11-23 NOTE — Progress Notes (Signed)
[facility] MD OP Progress Note  11/23/2017 5:12 PM [name]  MRN:  [id number]  Chief Complaint: ' I am here for follow up.' Chief Complaint    Follow-up; Medication Refill     HPI: [name] is a 72 year old Caucasian female, lives in [place], divorced, has a history of MDD, GAD, PTSD, multiple surgeries, hypertension, hyperlipidemia, history of tumor of the pituitary gland, presented to the clinic today for a follow-up visit.  Patient reports she continues to struggle with depressive symptoms like sadness, low energy, lack of motivation, memory problems and so on.  She reports she tried the mirtazapine as prescribed last visit.  She however reports it made her sleep through the next day and also she did not like the effect much.  She hence stopped taking the medication.  She continues to be in psychotherapy with Ms. Miguel Dibble which is going well.  Discussed adding Wellbutrin for her depressive symptoms.  She agrees with plan.  She reports she sleeps 5-6 hours at night.  Discussed with her that when she returns to clinic we can reassess the need for a sleep aid.  Since patient is very sensitive to medications in general will only initiate one medication at a time.  Patient denies any suicidality or homicidality.  She does report a lot of rumination about her relationship with her sister which is not going well.  She reports she wants to be there for her sister who has mental health problems.  Some time was spent providing supportive psychotherapy.  Also advised patient to reach out to Ms. Miguel Dibble.   Visit Diagnosis:    ICD-10-CM   1. MDD (major depressive disorder), recurrent episode, moderate (HCC) F33.1   2. PTSD (post-traumatic stress disorder) F43.10   3. GAD (generalized anxiety disorder) F41.1     Past Psychiatric History: Have reviewed past psychiatric history from my progress note on 07/14/2017.  Past trials of Zoloft, Wellbutrin, Effexor, Celexa, BuSpar, trazodone,  Abilify, mirtazapine.  Past Medical History:  Past Medical History:  Diagnosis Date  . Anemia   . Anxiety   . Arthritis   . Burning feet syndrome   . Chronic insomnia   . Complication of anesthesia    "AFFECTS MY MEMORY"  . COPD (chronic obstructive pulmonary disease) ([place])    documented in outside records  . Depression   . Dysrhythmia    PALPITATIONS   . Edema    MILD ANKLE  . GERD (gastroesophageal reflux disease)   . Heart murmur   . History of stomach ulcers   . HOH (hard of hearing)    AIDS  . Hypercholesterolemia   . Hyperparathyroidism, primary ([place])   . Hypertension   . Neuropathy   . Osteoporosis   . Palpitations   . Scoliosis   . Shortness of breath    with exertion  . Tingling    both hands    Past Surgical History:  Procedure Laterality Date  . ANTERIOR CERVICAL DECOMP/DISCECTOMY FUSION  10/28/2011   Procedure: ANTERIOR CERVICAL DECOMPRESSION/DISCECTOMY FUSION 2 LEVELS;  Surgeon: Elaina Hoops, MD;  Location: [facility];  Service: Neurosurgery;  Laterality: N/A;  . BREAST BIOPSY Right 1992   neg  . BREAST SURGERY     RT BREAST CYST REMOVED 15 YRS AGO   . CATARACT EXTRACTION W/PHACO Right 10/30/2014   Procedure: CATARACT EXTRACTION PHACO AND INTRAOCULAR LENS PLACEMENT (IOC);  Surgeon: Birder Robson, MD;  Location: [facility];  Service: Ophthalmology;  Laterality: Right;  US0:40  AP18.2 CDE7.42  [place] lot #[id number]  . CATARACT EXTRACTION W/PHACO Left 11/27/2014   Procedure: CATARACT EXTRACTION PHACO AND INTRAOCULAR LENS PLACEMENT (IOC);  Surgeon: Birder Robson, MD;  Location: [facility];  Service: Ophthalmology;  Laterality: Left;  [place] 00:36.9 AP%: 18.9 CDE: 6.96  [place] lot #  [id number]  . CYSTOCELE REPAIR N/A 05/13/2015   Procedure: ANTERIOR REPAIR (CYSTOCELE);  Surgeon: Brayton Mars, MD;  Location: [facility];  Service: Gynecology;  Laterality: N/A;  . CYSTOSCOPY  05/13/2015   Procedure: CYSTOSCOPY;  Surgeon: Brayton Mars, MD;   Location: [facility];  Service: Gynecology;;  . DILATION AND CURETTAGE OF UTERUS     X2   . JOINT REPLACEMENT     RT HIP  2013  (PARTIAL)   . PARATHYROIDECTOMY Left 08/25/2012   Procedure: PARATHYROIDECTOMY;  Surgeon: Earnstine Regal, MD;  Location: [facility];  Service: General;  Laterality: Left;  . PITUITARY SURGERY     FOR TUMOR 3 YRS   . TOTAL KNEE ARTHROPLASTY Right 07/18/2014   Procedure: TOTAL KNEE ARTHROPLASTY;  Surgeon: Dereck Leep, MD;  Location: [facility];  Service: Orthopedics;  Laterality: Right;  Marland Kitchen VAGINAL HYSTERECTOMY Bilateral 05/13/2015   Procedure: HYSTERECTOMY VAGINAL WITH BILATERAL SALPINGO OOPHERECTOMY;  Surgeon: Brayton Mars, MD;  Location: [facility];  Service: Gynecology;  Laterality: Bilateral;  . VAGINAL HYSTERECTOMY     bso ant repair    Family Psychiatric History: Reviewed family psychiatric history from my progress note on 07/14/2017 plan  Family History:  Family History  Problem Relation Age of Onset  . Glaucoma Mother   . Hypertension Mother   . Thyroid disease Mother   . Stroke Father   . Hypertension Father   . Bipolar disorder Sister   . Breast cancer Paternal Aunt        60's  . Cancer Neg Hx   . Diabetes Neg Hx     Social History: Reviewed social history from my progress note on 07/14/2017. Social History   Socioeconomic History  . Marital status: Divorced    Spouse name: Not on file  . Number of children: 1  . Years of education: Not on file  . Highest education level: Some college, no degree  Occupational History  . Occupation: retired  Scientific laboratory technician  . Financial resource strain: Hard  . Food insecurity:    Worry: Often true    Inability: Often true  . Transportation needs:    Medical: No    Non-medical: No  Tobacco Use  . Smoking status: Former Smoker    Packs/day: 1.00    Years: 35.00    Pack years: 35.00    Types: Cigarettes    Last attempt to quit: 02/10/2008    Years since quitting: 9.7  . Smokeless tobacco: Never Used   Substance and Sexual Activity  . Alcohol use: Yes    Alcohol/week: 0.0 standard drinks    Comment: rare  . Drug use: No  . Sexual activity: Never    Birth control/protection: Post-menopausal  Lifestyle  . Physical activity:    Days per week: 0 days    Minutes per session: 0 min  . Stress: Rather much  Relationships  . Social connections:    Talks on phone: Not on file    Gets together: Not on file    Attends religious service: Never    Active member of club or organization: No    Attends meetings of clubs or organizations: Never    Relationship  status: Divorced  Other Topics Concern  . Not on file  Social History Narrative  . Not on file    Allergies:  Allergies  Allergen Reactions  . Sulfamethoxazole-Trimethoprim Other (See Comments)    GI Upset  . Citalopram Hives  . Tape Itching    Metabolic Disorder Labs: Lab Results  Component Value Date   HGBA1C 5.8 05/26/2016   No results found for: PROLACTIN Lab Results  Component Value Date   CHOL 167 11/22/2017   TRIG 135.0 11/22/2017   HDL 66.50 11/22/2017   CHOLHDL 3 11/22/2017   VLDL 27.0 11/22/2017   LDLCALC 73 11/22/2017   LDLCALC 98 07/30/2017   Lab Results  Component Value Date   TSH 1.09 11/22/2017   TSH 1.55 10/15/2016    Therapeutic Level Labs: No results found for: LITHIUM No results found for: VALPROATE No components found for:  CBMZ  Current Medications: Current Outpatient Medications  Medication Sig Dispense Refill  . acetaminophen (TYLENOL) 500 MG tablet Take 1,000 mg by mouth every 6 (six) hours as needed for fever.     . Alpha-Lipoic Acid 600 MG CAPS Take by mouth.    Marland Kitchen amLODipine (NORVASC) 5 MG tablet Take 1 tablet (5 mg total) by mouth daily. 90 tablet 1  . aspirin EC 81 MG tablet Take 81 mg by mouth daily.    Marland Kitchen atorvastatin (LIPITOR) 40 MG tablet Take 1 tablet (40 mg total) by mouth daily. 90 tablet 1  . Biotin (BIOTIN MAXIMUM STRENGTH) 10 MG TABS Take 1 tablet by mouth daily.    .  chlorthalidone (HYGROTON) 25 MG tablet TAKE ONE TABLET EVERY DAY 90 tablet 1  . cholecalciferol (VITAMIN D) 1000 UNITS tablet Take 1,000 Units by mouth daily.     Marland Kitchen docusate sodium (COLACE) 100 MG capsule Take 100 mg by mouth 2 (two) times daily as needed for mild constipation.     . ferrous sulfate 325 (65 FE) MG tablet Take 325 mg by mouth daily.    Marland Kitchen gabapentin (NEURONTIN) 300 MG capsule Take 300 mg by mouth at bedtime.    . Glycerin-Polysorbate 80 (REFRESH DRY EYE THERAPY OP) Apply to eye.    . losartan (COZAAR) 100 MG tablet TAKE ONE TABLET BY MOUTH EVERY DAY 90 tablet 1  . magnesium oxide (MAG-OX) 400 MG tablet Take 400 mg by mouth daily.    . Multiple Vitamins-Minerals (MULTIVITAMIN PO) Take 1 tablet by mouth daily.    . pantoprazole (PROTONIX) 40 MG tablet TAKE ONE TABLET BY MOUTH EVERY DAY 60 tablet 3  . potassium chloride (K-DUR) 10 MEQ tablet Take 1 tablet (10 mEq total) by mouth daily. 90 tablet 1  . Probiotic Product (FLORAJEN3 PO) Take 1 tablet by mouth.    . ranitidine (ZANTAC) 150 MG tablet Take 150 mg by mouth 2 (two) times daily.    Marland Kitchen buPROPion (WELLBUTRIN) 75 MG tablet Take 1 tablet (75 mg total) by mouth every morning. 10 tablet 0   No current facility-administered medications for this visit.      Musculoskeletal: Strength & Muscle Tone: within normal limits Gait & Station: normal Patient leans: N/A  Psychiatric Specialty Exam: Review of Systems  Psychiatric/Behavioral: Positive for depression. The patient is nervous/anxious and has insomnia (restless).   All other systems reviewed and are negative.   Blood pressure (!) 146/77, pulse 61, temperature (!) 97.5 F (36.4 C), temperature source Oral, weight 131 lb 12.8 oz (59.8 kg).Body mass index is 21.27 kg/m.  General Appearance: Casual  Eye Contact:  Fair  Speech:  Normal Rate  Volume:  Normal  Mood:  Dysphoric  Affect:  Tearful  Thought Process:  Goal Directed and Descriptions of Associations: Intact   Orientation:  Full (Time, Place, and Person)  Thought Content: Logical   Suicidal Thoughts:  No  Homicidal Thoughts:  No  Memory:  Immediate;   Fair Recent;   Fair Remote;   Fair  Judgement:  Fair  Insight:  Fair  Psychomotor Activity:  Normal  Concentration:  Concentration: Fair and Attention Span: Fair  Recall:  [employer] of Knowledge: Fair  Language: Fair  Akathisia:  No  Handed:  Right  AIMS (if indicated): na  Assets:  Communication Skills Desire for Improvement Social Support  ADL's:  Intact  Cognition: WNL  Sleep:  restless   Screenings: Mini-Mental     Office Visit from 06/18/2014 in [facility]  Total Score (max 30 points )  30    PHQ2-9     Clinical Support from 02/05/2017 in [facility] from 01/29/2016 in [facility] Office Visit from 10/11/2015 in [facility] Office Visit from 06/18/2014 in [facility] Office Visit from 05/17/2014 in [facility]  PHQ-2 Total Score  1  2  3  2   0  PHQ-9 Total Score  3  6  14  8   -       Assessment and Plan: [name] is a 72 year old Caucasian female, divorced, lives in [place], has a history of depression, anxiety, PTSD, hyperlipidemia, multiple hip surgeries, knee surgeries, history of pituitary gland tumor, history of headaches, presented to the clinic today for a follow-up visit.  Patient with several psychosocial stressors including relationship struggles, limited social support system, being divorced and so on.  Patient is biologically predisposed given her history of trauma, history of mental health problems in her family.  Did not tolerate the mirtazapine well.  Discussed medication management as noted below.  Plan MDD Discontinue mirtazapine for side effects Start Wellbutrin 75 mg p.o. daily Continue psychotherapy with Ms. Miguel Dibble.    GAD Continue CBT  For insomnia We will  continue to reassess.  She reports sleep as restless.  Rule out cognitive disorder Patient with word finding difficulty unknown if this is due to her depressive symptoms.MOCA per Dr.Eksir on 08/09/2017-21 out of 30.  Patient has an appointment with her neurologist in October.  [facility]  test results were reviewed while making medication changes.  For elevated blood pressure she will reach out to her primary medical doctor  Follow-up in clinic in 1 week or sooner if needed  More than 50 % of the time was spent for psychoeducation and supportive psychotherapy and care coordination.  This note was generated in part or whole with voice recognition software. Voice recognition is usually quite accurate but there are transcription errors that can and very often do occur. I apologize for any typographical errors that were not detected and corrected.         Ursula Alert, MD 11/24/2017, 8:58 AM

## 2017-11-23 NOTE — Patient Instructions (Signed)
       Bupropion tablets (Depression/Mood Disorders) What is this medicine? BUPROPION (byoo PROE pee on) is used to treat depression. This medicine may be used for other purposes; ask your health care provider or pharmacist if you have questions. COMMON BRAND NAME(S): Wellbutrin What should I tell my health care provider before I take this medicine? They need to know if you have any of these conditions: -an eating disorder, such as anorexia or bulimia -bipolar disorder or psychosis -diabetes or high blood sugar, treated with medication -glaucoma -heart disease, previous heart attack, or irregular heart beat -head injury or brain tumor -high blood pressure -kidney or liver disease -seizures -suicidal thoughts or a previous suicide attempt -Tourette's syndrome -weight loss -an unusual or allergic reaction to bupropion, other medicines, foods, dyes, or preservatives -breast-feeding -pregnant or trying to become pregnant How should I use this medicine? Take this medicine by mouth with a glass of water. Follow the directions on the prescription label. You can take it with or without food. If it upsets your stomach, take it with food. Take your medicine at regular intervals. Do not take your medicine more often than directed. Do not stop taking this medicine suddenly except upon the advice of your doctor. Stopping this medicine too quickly may cause serious side effects or your condition may worsen. A special MedGuide will be given to you by the pharmacist with each prescription and refill. Be sure to read this information carefully each time. Talk to your pediatrician regarding the use of this medicine in children. Special care may be needed. Overdosage: If you think you have taken too much of this medicine contact a poison control center or emergency room at once. NOTE: This medicine is only for you. Do not share this medicine with others. What if I miss a dose? If you miss a dose,  take it as soon as you can. If it is less than four hours to your next dose, take only that dose and skip the missed dose. Do not take double or extra doses. What may interact with this medicine? Do not take this medicine with any of the following medications: -linezolid -MAOIs like Azilect, Carbex, Eldepryl, Marplan, Nardil, and Parnate -methylene blue (injected into a vein) -other medicines that contain bupropion like Zyban This medicine may also interact with the following medications: -alcohol -certain medicines for anxiety or sleep -certain medicines for blood pressure like metoprolol, propranolol -certain medicines for depression or psychotic disturbances -certain medicines for HIV or AIDS like efavirenz, lopinavir, nelfinavir, ritonavir -certain medicines for irregular heart beat like propafenone, flecainide -certain medicines for Parkinson's disease like amantadine, levodopa -certain medicines for seizures like carbamazepine, phenytoin, phenobarbital -cimetidine -clopidogrel -cyclophosphamide -digoxin -furazolidone -isoniazid -nicotine -orphenadrine -procarbazine -steroid medicines like prednisone or cortisone -stimulant medicines for attention disorders, weight loss, or to stay awake -tamoxifen -theophylline -thiotepa -ticlopidine -tramadol -warfarin This list may not describe all possible interactions. Give your health care provider a list of all the medicines, herbs, non-prescription drugs, or dietary supplements you use. Also tell them if you smoke, drink alcohol, or use illegal drugs. Some items may interact with your medicine. What should I watch for while using this medicine? Tell your doctor if your symptoms do not get better or if they get worse. Visit your doctor or health care professional for regular checks on your progress. Because it may take several weeks to see the full effects of this medicine, it is important to continue your treatment as prescribed by    your doctor. Patients and their families should watch out for new or worsening thoughts of suicide or depression. Also watch out for sudden changes in feelings such as feeling anxious, agitated, panicky, irritable, hostile, aggressive, impulsive, severely restless, overly excited and hyperactive, or not being able to sleep. If this happens, especially at the beginning of treatment or after a change in dose, call your health care professional. Avoid alcoholic drinks while taking this medicine. Drinking excessive alcoholic beverages, using sleeping or anxiety medicines, or quickly stopping the use of these agents while taking this medicine may increase your risk for a seizure. Do not drive or use heavy machinery until you know how this medicine affects you. This medicine can impair your ability to perform these tasks. Do not take this medicine close to bedtime. It may prevent you from sleeping. Your mouth may get dry. Chewing sugarless gum or sucking hard candy, and drinking plenty of water may help. Contact your doctor if the problem does not go away or is severe. What side effects may I notice from receiving this medicine? Side effects that you should report to your doctor or health care professional as soon as possible: -allergic reactions like skin rash, itching or hives, swelling of the face, lips, or tongue -breathing problems -changes in vision -confusion -elevated mood, decreased need for sleep, racing thoughts, impulsive behavior -fast or irregular heartbeat -hallucinations, loss of contact with reality -increased blood pressure -redness, blistering, peeling or loosening of the skin, including inside the mouth -seizures -suicidal thoughts or other mood changes -unusually weak or tired -vomiting Side effects that usually do not require medical attention (report to your doctor or health care professional if they continue or are bothersome): -constipation -headache -loss of  appetite -nausea -tremors -weight loss This list may not describe all possible side effects. Call your doctor for medical advice about side effects. You may report side effects to FDA at 1-800-FDA-1088. Where should I keep my medicine? Keep out of the reach of children. Store at room temperature between 20 and 25 degrees C (68 and 77 degrees F), away from direct sunlight and moisture. Keep tightly closed. Throw away any unused medicine after the expiration date. NOTE: This sheet is a summary. It may not cover all possible information. If you have questions about this medicine, talk to your doctor, pharmacist, or health care provider.  2018 [employer] (2015-07-19 13:44:21)  

## 2017-11-24 ENCOUNTER — Encounter: Payer: Self-pay | Admitting: Psychiatry

## 2017-11-24 ENCOUNTER — Encounter: Payer: Self-pay | Admitting: Internal Medicine

## 2017-11-24 ENCOUNTER — Telehealth: Payer: Self-pay | Admitting: Internal Medicine

## 2017-11-24 ENCOUNTER — Ambulatory Visit: Payer: Medicare Other | Admitting: Internal Medicine

## 2017-11-24 VITALS — BP 122/68 | HR 73 | Temp 97.6°F | Resp 18 | Ht 66.0 in | Wt 131.2 lb

## 2017-11-24 DIAGNOSIS — F419 Anxiety disorder, unspecified: Secondary | ICD-10-CM | POA: Diagnosis not present

## 2017-11-24 DIAGNOSIS — E210 Primary hyperparathyroidism: Secondary | ICD-10-CM | POA: Diagnosis not present

## 2017-11-24 DIAGNOSIS — Z0000 Encounter for general adult medical examination without abnormal findings: Secondary | ICD-10-CM | POA: Diagnosis not present

## 2017-11-24 DIAGNOSIS — D649 Anemia, unspecified: Secondary | ICD-10-CM

## 2017-11-24 DIAGNOSIS — N6311 Unspecified lump in the right breast, upper outer quadrant: Secondary | ICD-10-CM | POA: Diagnosis not present

## 2017-11-24 DIAGNOSIS — E7800 Pure hypercholesterolemia, unspecified: Secondary | ICD-10-CM

## 2017-11-24 DIAGNOSIS — I10 Essential (primary) hypertension: Secondary | ICD-10-CM

## 2017-11-24 DIAGNOSIS — F329 Major depressive disorder, single episode, unspecified: Secondary | ICD-10-CM

## 2017-11-24 DIAGNOSIS — E876 Hypokalemia: Secondary | ICD-10-CM

## 2017-11-24 DIAGNOSIS — F32A Depression, unspecified: Secondary | ICD-10-CM

## 2017-11-24 MED ORDER — MUPIROCIN 2 % EX OINT: 1.0000 "application " | TOPICAL_OINTMENT | Freq: Two times a day (BID) | CUTANEOUS | 0 refills | Status: DC

## 2017-11-24 NOTE — Telephone Encounter (Signed)
The potassium is just slightly decreased.  I talked with her about this today.  The chlorthalidone could possibly be contributing , but I would not change anything with her medication.  Send her info on foods with increased potassium.  Schedule her for a f/u potassium check in 7-10 days.

## 2017-11-24 NOTE — Telephone Encounter (Signed)
Patient is concerned about her potassium level. Labs were discussed at her visit today. She has a slight decrease.

## 2017-11-24 NOTE — Progress Notes (Signed)
Patient ID: [name], female   DOB: May 12, 1945, 72 y.o.   MRN: [id number]   Subjective:    Patient ID: [name], female    DOB: 1945/12/28, 72 y.o.   MRN: [id number]  HPI  Patient here for her physical exam.  States she is doing relatively well.  Saw Dr Ubaldo Glassing 06/2017.  Stable.  Recommended f/u in one year.  Seeing psychiatry.  Did not tolerate remeron.  Just had f/u scheduled 11/23/17.  Placed on wellbutrin.  Has had two doses.  Still with increased stress.  Seeing a therapist.  This appears to be going well.  No chest pain.  Eating.  Breathing stable.  No acid reflux reported.  No abdominal pain.  Does report breast tenderness and fullness - right outer breast.  S/p recent biopsy.  Seeing Dr Bary Castilla.  Concerned regarding the persistent pain and palpable firm area.  Previous redness has resolved.     Past Medical History:  Diagnosis Date  . Anemia   . Anxiety   . Arthritis   . Burning feet syndrome   . Chronic insomnia   . Complication of anesthesia    "AFFECTS MY MEMORY"  . COPD (chronic obstructive pulmonary disease) ([place])    documented in outside records  . Depression   . Dysrhythmia    PALPITATIONS   . Edema    MILD ANKLE  . GERD (gastroesophageal reflux disease)   . Heart murmur   . History of stomach ulcers   . HOH (hard of hearing)    AIDS  . Hypercholesterolemia   . Hyperparathyroidism, primary ([place])   . Hypertension   . Neuropathy   . Osteoporosis   . Palpitations   . Scoliosis   . Shortness of breath    with exertion  . Tingling    both hands   Past Surgical History:  Procedure Laterality Date  . ANTERIOR CERVICAL DECOMP/DISCECTOMY FUSION  10/28/2011   Procedure: ANTERIOR CERVICAL DECOMPRESSION/DISCECTOMY FUSION 2 LEVELS;  Surgeon: Elaina Hoops, MD;  Location: [facility];  Service: Neurosurgery;  Laterality: N/A;  . BREAST BIOPSY Right 1992   neg  . BREAST SURGERY     RT BREAST CYST REMOVED 15 YRS AGO   . CATARACT EXTRACTION W/PHACO Right  10/30/2014   Procedure: CATARACT EXTRACTION PHACO AND INTRAOCULAR LENS PLACEMENT (IOC);  Surgeon: Birder Robson, MD;  Location: [facility];  Service: Ophthalmology;  Laterality: Right;  US0:40 AP18.2 CDE7.42  [place] lot #[id number]  . CATARACT EXTRACTION W/PHACO Left 11/27/2014   Procedure: CATARACT EXTRACTION PHACO AND INTRAOCULAR LENS PLACEMENT (IOC);  Surgeon: Birder Robson, MD;  Location: [facility];  Service: Ophthalmology;  Laterality: Left;  [place] 00:36.9 AP%: 18.9 CDE: 6.96  [place] lot #  [id number]  . CYSTOCELE REPAIR N/A 05/13/2015   Procedure: ANTERIOR REPAIR (CYSTOCELE);  Surgeon: Brayton Mars, MD;  Location: [facility];  Service: Gynecology;  Laterality: N/A;  . CYSTOSCOPY  05/13/2015   Procedure: CYSTOSCOPY;  Surgeon: Brayton Mars, MD;  Location: [facility];  Service: Gynecology;;  . DILATION AND CURETTAGE OF UTERUS     X2   . JOINT REPLACEMENT     RT HIP  2013  (PARTIAL)   . PARATHYROIDECTOMY Left 08/25/2012   Procedure: PARATHYROIDECTOMY;  Surgeon: Earnstine Regal, MD;  Location: [facility];  Service: General;  Laterality: Left;  . PITUITARY SURGERY     FOR TUMOR 3 YRS   . TOTAL KNEE ARTHROPLASTY Right 07/18/2014   Procedure: TOTAL KNEE ARTHROPLASTY;  Surgeon: Dereck Leep, MD;  Location: [facility];  Service: Orthopedics;  Laterality: Right;  Marland Kitchen VAGINAL HYSTERECTOMY Bilateral 05/13/2015   Procedure: HYSTERECTOMY VAGINAL WITH BILATERAL SALPINGO OOPHERECTOMY;  Surgeon: Brayton Mars, MD;  Location: [facility];  Service: Gynecology;  Laterality: Bilateral;  . VAGINAL HYSTERECTOMY     bso ant repair   Family History  Problem Relation Age of Onset  . Glaucoma Mother   . Hypertension Mother   . Thyroid disease Mother   . Stroke Father   . Hypertension Father   . Bipolar disorder Sister   . Breast cancer Paternal Aunt        60's  . Cancer Neg Hx   . Diabetes Neg Hx    Social History   Socioeconomic History  . Marital status: Divorced    Spouse name: Not on file   . Number of children: 1  . Years of education: Not on file  . Highest education level: Some college, no degree  Occupational History  . Occupation: retired  Scientific laboratory technician  . Financial resource strain: Hard  . Food insecurity:    Worry: Often true    Inability: Often true  . Transportation needs:    Medical: No    Non-medical: No  Tobacco Use  . Smoking status: Former Smoker    Packs/day: 1.00    Years: 35.00    Pack years: 35.00    Types: Cigarettes    Last attempt to quit: 02/10/2008    Years since quitting: 9.7  . Smokeless tobacco: Never Used  Substance and Sexual Activity  . Alcohol use: Yes    Alcohol/week: 0.0 standard drinks    Comment: rare  . Drug use: No  . Sexual activity: Never    Birth control/protection: Post-menopausal  Lifestyle  . Physical activity:    Days per week: 0 days    Minutes per session: 0 min  . Stress: Rather much  Relationships  . Social connections:    Talks on phone: Not on file    Gets together: Not on file    Attends religious service: Never    Active member of club or organization: No    Attends meetings of clubs or organizations: Never    Relationship status: Divorced  Other Topics Concern  . Not on file  Social History Narrative  . Not on file    Outpatient Encounter Medications as of 11/24/2017  Medication Sig  . acetaminophen (TYLENOL) 500 MG tablet Take 1,000 mg by mouth every 6 (six) hours as needed for fever.   . Alpha-Lipoic Acid 600 MG CAPS Take by mouth.  Marland Kitchen amLODipine (NORVASC) 5 MG tablet Take 1 tablet (5 mg total) by mouth daily.  Marland Kitchen aspirin EC 81 MG tablet Take 81 mg by mouth daily.  Marland Kitchen atorvastatin (LIPITOR) 40 MG tablet Take 1 tablet (40 mg total) by mouth daily.  . Biotin (BIOTIN MAXIMUM STRENGTH) 10 MG TABS Take 1 tablet by mouth daily.  Marland Kitchen buPROPion (WELLBUTRIN) 75 MG tablet Take 1 tablet (75 mg total) by mouth every morning.  . chlorthalidone (HYGROTON) 25 MG tablet TAKE ONE TABLET EVERY DAY  . cholecalciferol  (VITAMIN D) 1000 UNITS tablet Take 1,000 Units by mouth daily.   Marland Kitchen docusate sodium (COLACE) 100 MG capsule Take 100 mg by mouth 2 (two) times daily as needed for mild constipation.   . ferrous sulfate 325 (65 FE) MG tablet Take 325 mg by mouth daily.  Marland Kitchen gabapentin (NEURONTIN) 300 MG capsule Take  300 mg by mouth at bedtime.  . Glycerin-Polysorbate 80 (REFRESH DRY EYE THERAPY OP) Apply to eye.  . losartan (COZAAR) 100 MG tablet TAKE ONE TABLET BY MOUTH EVERY DAY  . magnesium oxide (MAG-OX) 400 MG tablet Take 400 mg by mouth daily.  . Multiple Vitamins-Minerals (MULTIVITAMIN PO) Take 1 tablet by mouth daily.  . pantoprazole (PROTONIX) 40 MG tablet TAKE ONE TABLET BY MOUTH EVERY DAY  . potassium chloride (K-DUR) 10 MEQ tablet Take 1 tablet (10 mEq total) by mouth daily.  . Probiotic Product (FLORAJEN3 PO) Take 1 tablet by mouth.  . ranitidine (ZANTAC) 150 MG tablet Take 150 mg by mouth 2 (two) times daily.  . mupirocin ointment (BACTROBAN) 2 % Place 1 application into the nose 2 (two) times daily.   No facility-administered encounter medications on file as of 11/24/2017.     Review of Systems  Constitutional: Negative for appetite change and fever.  HENT: Negative for congestion and sinus pressure.   Eyes: Negative for pain and visual disturbance.  Respiratory: Negative for cough, chest tightness and shortness of breath.   Cardiovascular: Negative for chest pain, palpitations and leg swelling.  Gastrointestinal: Negative for abdominal pain, diarrhea, nausea and vomiting.  Genitourinary: Negative for difficulty urinating and dysuria.  Musculoskeletal: Negative for joint swelling and myalgias.  Skin: Negative for color change and rash.  Neurological: Negative for dizziness, light-headedness and headaches.  Hematological: Negative for adenopathy. Does not bruise/bleed easily.  Psychiatric/Behavioral: Negative for agitation.       Increases stress and anxiety as outlined.         Objective:     Physical Exam  Constitutional: She appears well-developed and well-nourished. No distress.  HENT:  Nose: Nose normal.  Mouth/Throat: Oropharynx is clear and moist.  Eyes: Conjunctivae are normal. Right eye exhibits no discharge. Left eye exhibits no discharge.  Neck: Neck supple. No thyromegaly present.  Cardiovascular: Normal rate and regular rhythm.  Pulmonary/Chest: Breath sounds normal. No respiratory distress. She has no wheezes.  Breasts:  Nipple - right - inverted.  Increased firm palpable fullness right lateral breast (upper breast).  Increased tenderness to palpation.  No axillary adenopathy appreciated.  No nipple discharge or nipple retraction present left breast.  No palpable nodule or axillary adenopathy - left breast.    Abdominal: Soft. Bowel sounds are normal. There is no tenderness.  Genitourinary:  Genitourinary Comments: Not performed.    Musculoskeletal: She exhibits no edema or tenderness.  Lymphadenopathy:    She has no cervical adenopathy.  Skin: No rash noted. No erythema.  Psychiatric: She has a normal mood and affect. Her behavior is normal.    BP 122/68 (BP Location: Left Arm, Patient Position: Sitting, Cuff Size: Normal)   Pulse 73   Temp 97.6 F (36.4 C) (Oral)   Resp 18   Ht 5\' 6"  (1.676 m)   Wt 131 lb 3.2 oz (59.5 kg)   SpO2 96%   BMI 21.18 kg/m  Wt Readings from Last 3 Encounters:  11/24/17 131 lb 3.2 oz (59.5 kg)  11/23/17 131 lb 12.8 oz (59.8 kg)  11/11/17 130 lb (59 kg)     Lab Results  Component Value Date   WBC 6.2 07/30/2017   HGB 14.2 07/30/2017   HCT 41.9 07/30/2017   PLT 255.0 07/30/2017   GLUCOSE 94 11/22/2017   CHOL 167 11/22/2017   TRIG 135.0 11/22/2017   HDL 66.50 11/22/2017   LDLDIRECT 86.0 05/26/2016   LDLCALC 73 11/22/2017   ALT  31 11/22/2017   AST 20 11/22/2017   NA 142 11/22/2017   K 3.4 (L) 11/22/2017   CL 102 11/22/2017   CREATININE 0.56 11/22/2017   BUN 19 11/22/2017   CO2 35 (H) 11/22/2017   TSH 1.09  11/22/2017   INR 0.91 07/05/2014   HGBA1C 5.8 05/26/2016    US Breast Ltd Uni Right Inc Axilla  Result Date: 11/10/2017 CLINICAL DATA:  3 x 4 cm right breast palpable mass felt clinically. The patient feels a separate small area of palpable concern in her lateral right breast. The patient has a history of spontaneous bloody nipple discharge, for which she was evaluated 2 months ago, with recommendation for breast MRI. EXAM: DIGITAL DIAGNOSTIC RIGHT MAMMOGRAM WITH CAD AND TOMO ULTRASOUND RIGHT BREAST COMPARISON:  Previous exam(s). ACR Breast Density Category c: The breast tissue is heterogeneously dense, which may obscure small masses. FINDINGS: Mammographically, there is an ill-defined large focal asymmetry in the right breast upper outer quadrant, involving large portion of the anterior and middle depth of the breast. At the site of palpable concern felt by the patient in the right 10 o'clock breast, middle depth, there is a 7 mm circumscribed fat containing nodule with appearance suggestive of an intramammary lymph node with thickened cortex. There is a prominent right nipple inversion and slight retroareolar skin thickening. There is 1 axillary lymph node with thickened cortex. Mammographic images were processed with CAD. On physical exam, there is a large, approximately 5-6 cm firm palpable mass in the right breast upper outer quadrant, anterior depth, centered at approximately 10-11 o'clock. There is prominent nipple inversion. Targeted ultrasound is performed, showing an ill-defined heterogeneous mass in the right breast 9-12 o'clock approximately 2 cm from the nipple, which corresponds to the large palpable mass felt on physical exam. The abnormality is best measured at 5.4 by 2.3 by 5.7 cm. There is tubular medial extension of this mass toward the 1 o'clock right breast which measures 3.3 x 0.7 cm. 1 cm further medially, there is a separate satellite nodule labeled 2:30 o'clock 3 cm from the nipple  which measures 0.6 x 0.4 x 0.8 cm. There is an additional separate satellite nodule in the right breast 12 o'clock 6 cm from the nipple which measures 1.0 x 0.5 x 0.9 cm. Duct ectasia is seen in the subareolar right breast. Sonographically, the right lateral breast area of palpable concern felt by the patient, corresponds to an abnormal lymph node which measures 0.7 x 0.3 x 0.3 cm in the right breast 10 o'clock 6 cm from the nipple. There are 2 abnormal right axillary lymph nodes with cortical thickening of up to 4 mm. IMPRESSION: Large, possibly 5.7 cm palpable mass in the right breast upper outer quadrant with several satellite nodules, extending into the upper inner quadrant, suspicious for multicentric malignancy. Abnormal right breast 10 o'clock 6 cm from the nipple intramammary lymph node. Two abnormal right axillary lymph nodes. RECOMMENDATION: Ultrasound-guided core needle biopsy of the lateral extent of the large upper outer quadrant mass, and the medial extend of the tubular extension at 1 o'clock. Ultrasound-guided core needle biopsy of the abnormal right breast 10 o'clock intramammary lymph node, and 1 of the abnormal right axillary lymph nodes. If found important in the clinical decision making, ultrasound-guided core needle biopsies of the satellite nodules at the 12 o'clock 6 cm from the nipple and 2:30 o'clock 3 cm from the nipple may be considered at a later time. I have discussed the findings and  recommendations with the patient. Results were also provided in writing at the conclusion of the visit. If applicable, a reminder letter will be sent to the patient regarding the next appointment. BI-RADS CATEGORY  4: Suspicious. Electronically Signed   By: Fidela Salisbury M.D.   On: 11/10/2017 13:15   Mm Diag Breast Tomo Uni Right  Result Date: 11/10/2017 CLINICAL DATA:  3 x 4 cm right breast palpable mass felt clinically. The patient feels a separate small area of palpable concern in her lateral  right breast. The patient has a history of spontaneous bloody nipple discharge, for which she was evaluated 2 months ago, with recommendation for breast MRI. EXAM: DIGITAL DIAGNOSTIC RIGHT MAMMOGRAM WITH CAD AND TOMO ULTRASOUND RIGHT BREAST COMPARISON:  Previous exam(s). ACR Breast Density Category c: The breast tissue is heterogeneously dense, which may obscure small masses. FINDINGS: Mammographically, there is an ill-defined large focal asymmetry in the right breast upper outer quadrant, involving large portion of the anterior and middle depth of the breast. At the site of palpable concern felt by the patient in the right 10 o'clock breast, middle depth, there is a 7 mm circumscribed fat containing nodule with appearance suggestive of an intramammary lymph node with thickened cortex. There is a prominent right nipple inversion and slight retroareolar skin thickening. There is 1 axillary lymph node with thickened cortex. Mammographic images were processed with CAD. On physical exam, there is a large, approximately 5-6 cm firm palpable mass in the right breast upper outer quadrant, anterior depth, centered at approximately 10-11 o'clock. There is prominent nipple inversion. Targeted ultrasound is performed, showing an ill-defined heterogeneous mass in the right breast 9-12 o'clock approximately 2 cm from the nipple, which corresponds to the large palpable mass felt on physical exam. The abnormality is best measured at 5.4 by 2.3 by 5.7 cm. There is tubular medial extension of this mass toward the 1 o'clock right breast which measures 3.3 x 0.7 cm. 1 cm further medially, there is a separate satellite nodule labeled 2:30 o'clock 3 cm from the nipple which measures 0.6 x 0.4 x 0.8 cm. There is an additional separate satellite nodule in the right breast 12 o'clock 6 cm from the nipple which measures 1.0 x 0.5 x 0.9 cm. Duct ectasia is seen in the subareolar right breast. Sonographically, the right lateral breast area of  palpable concern felt by the patient, corresponds to an abnormal lymph node which measures 0.7 x 0.3 x 0.3 cm in the right breast 10 o'clock 6 cm from the nipple. There are 2 abnormal right axillary lymph nodes with cortical thickening of up to 4 mm. IMPRESSION: Large, possibly 5.7 cm palpable mass in the right breast upper outer quadrant with several satellite nodules, extending into the upper inner quadrant, suspicious for multicentric malignancy. Abnormal right breast 10 o'clock 6 cm from the nipple intramammary lymph node. Two abnormal right axillary lymph nodes. RECOMMENDATION: Ultrasound-guided core needle biopsy of the lateral extent of the large upper outer quadrant mass, and the medial extend of the tubular extension at 1 o'clock. Ultrasound-guided core needle biopsy of the abnormal right breast 10 o'clock intramammary lymph node, and 1 of the abnormal right axillary lymph nodes. If found important in the clinical decision making, ultrasound-guided core needle biopsies of the satellite nodules at the 12 o'clock 6 cm from the nipple and 2:30 o'clock 3 cm from the nipple may be considered at a later time. I have discussed the findings and recommendations with the patient. Results were  also provided in writing at the conclusion of the visit. If applicable, a reminder letter will be sent to the patient regarding the next appointment. BI-RADS CATEGORY  4: Suspicious. Electronically Signed   By: Fidela Salisbury M.D.   On: 11/10/2017 13:15       Assessment & Plan:   Problem List Items Addressed This Visit    Anemia    Follow cbc.       Anxiety    Seeing psychiatry.  Did not tolerate remeron.  Just started wellbutrin.  Continue f/u with psychiatry.  Discussed with her today.        Depression    Just started wellbutrin.  Continue f/u with psychiatry.        Health care maintenance    Physical today 11/25/17.  Mammogram 09/17/17 - Birads II.  Seeing Dr Bary Castilla for f/u.  Colonoscopy 09/15/13.  Per  note, recommended f/u colonoscopy in 2025.        Hypercholesterolemia    On lipitor.  Low cholesterol diet and exercise.  Follow lipid panel and liver function tests.        Hyperparathyroidism, primary ([place])    S/p surgery.  Was followed by Dr Harlow Asa.        Hypertension    Blood pressure under good control.  Continue same medication regimen.  Follow pressures.  Follow metabolic panel.        Mass of upper outer quadrant of right breast    Recently evaluated by Dr Bary Castilla.  Persistent firm, fullness and tenderness.  Will have Dr Bary Castilla evaluate and discuss further treatment options.         Other Visit Diagnoses    Routine general medical examination at a health care facility    -  Primary   Hypokalemia       Slightly decreased potassium.  Give info on foods with increased potassium.  Continue current supplements.  Follow potassium.    Relevant Orders   Potassium       Einar Pheasant, MD

## 2017-11-24 NOTE — Telephone Encounter (Signed)
Patient wants to know about her potasium levels. What is causing it to be low. States she uses a little bit of sea salt on her food. Please call back [phone]

## 2017-11-25 ENCOUNTER — Ambulatory Visit: Payer: Medicare Other | Admitting: General Surgery

## 2017-11-25 ENCOUNTER — Encounter: Payer: Self-pay | Admitting: General Surgery

## 2017-11-25 ENCOUNTER — Ambulatory Visit: Payer: Self-pay

## 2017-11-25 ENCOUNTER — Ambulatory Visit (INDEPENDENT_AMBULATORY_CARE_PROVIDER_SITE_OTHER): Payer: Medicare Other | Admitting: General Surgery

## 2017-11-25 ENCOUNTER — Encounter: Payer: Self-pay | Admitting: Internal Medicine

## 2017-11-25 ENCOUNTER — Other Ambulatory Visit: Payer: Self-pay

## 2017-11-25 VITALS — BP 135/85 | HR 65 | Temp 97.9°F | Resp 16 | Ht 66.0 in | Wt 131.0 lb

## 2017-11-25 DIAGNOSIS — N6311 Unspecified lump in the right breast, upper outer quadrant: Secondary | ICD-10-CM

## 2017-11-25 DIAGNOSIS — N641 Fat necrosis of breast: Secondary | ICD-10-CM

## 2017-11-25 MED ORDER — PREDNISONE 10 MG (21) PO TBPK: ORAL_TABLET | ORAL | 0 refills | Status: DC

## 2017-11-25 NOTE — Telephone Encounter (Signed)
Called patient. Got message saying that the number was temporarily disconnected

## 2017-11-25 NOTE — Patient Instructions (Addendum)
The patient is aware to call back for any questions or new concerns.  Prednisone 6 day taper: 60 mg, 50 mg , 40 mg, 30 mg, 20 mg, 10 mg  Hold off on flu shot

## 2017-11-25 NOTE — Assessment & Plan Note (Signed)
Recently evaluated by Dr Bary Castilla.  Persistent firm, fullness and tenderness.  Will have Dr Bary Castilla evaluate and discuss further treatment options.

## 2017-11-25 NOTE — Progress Notes (Signed)
Patient ID: [name], female   DOB: 12-Nov-1945, 72 y.o.   MRN: [id number]  Chief Complaint  Patient presents with  . Follow-up    HPI [name] is a 72 y.o. female.  Here today for evaluation of her right breast referred by Dr Nicki Reaper. Right breast core biopsy 11-11-17. She states the area is swollen and is sore to touch.  HPI  Past Medical History:  Diagnosis Date  . Anemia   . Anxiety   . Arthritis   . Burning feet syndrome   . Chronic insomnia   . Complication of anesthesia    "AFFECTS MY MEMORY"  . COPD (chronic obstructive pulmonary disease) ([place])    documented in outside records  . Depression   . Dysrhythmia    PALPITATIONS   . Edema    MILD ANKLE  . GERD (gastroesophageal reflux disease)   . Heart murmur   . History of stomach ulcers   . HOH (hard of hearing)    AIDS  . Hypercholesterolemia   . Hyperparathyroidism, primary ([place])   . Hypertension   . Neuropathy   . Osteoporosis   . Palpitations   . Scoliosis   . Shortness of breath    with exertion  . Tingling    both hands    Past Surgical History:  Procedure Laterality Date  . ANTERIOR CERVICAL DECOMP/DISCECTOMY FUSION  10/28/2011   Procedure: ANTERIOR CERVICAL DECOMPRESSION/DISCECTOMY FUSION 2 LEVELS;  Surgeon: Elaina Hoops, MD;  Location: [facility];  Service: Neurosurgery;  Laterality: N/A;  . BREAST BIOPSY Right 1992   neg  . BREAST SURGERY     RT BREAST CYST REMOVED 15 YRS AGO   . CATARACT EXTRACTION W/PHACO Right 10/30/2014   Procedure: CATARACT EXTRACTION PHACO AND INTRAOCULAR LENS PLACEMENT (IOC);  Surgeon: Birder Robson, MD;  Location: [facility];  Service: Ophthalmology;  Laterality: Right;  US0:40 AP18.2 CDE7.42  casette lot #[id number]  . CATARACT EXTRACTION W/PHACO Left 11/27/2014   Procedure: CATARACT EXTRACTION PHACO AND INTRAOCULAR LENS PLACEMENT (IOC);  Surgeon: Birder Robson, MD;  Location: [facility];  Service: Ophthalmology;  Laterality: Left;  [place] 00:36.9 AP%:  18.9 CDE: 6.96  casette lot #  [id number]  . CYSTOCELE REPAIR N/A 05/13/2015   Procedure: ANTERIOR REPAIR (CYSTOCELE);  Surgeon: Brayton Mars, MD;  Location: [facility];  Service: Gynecology;  Laterality: N/A;  . CYSTOSCOPY  05/13/2015   Procedure: CYSTOSCOPY;  Surgeon: Brayton Mars, MD;  Location: [facility];  Service: Gynecology;;  . DILATION AND CURETTAGE OF UTERUS     X2   . JOINT REPLACEMENT     RT HIP  2013  (PARTIAL)   . PARATHYROIDECTOMY Left 08/25/2012   Procedure: PARATHYROIDECTOMY;  Surgeon: Earnstine Regal, MD;  Location: [facility];  Service: General;  Laterality: Left;  . PITUITARY SURGERY     FOR TUMOR 3 YRS   . TOTAL KNEE ARTHROPLASTY Right 07/18/2014   Procedure: TOTAL KNEE ARTHROPLASTY;  Surgeon: Dereck Leep, MD;  Location: [facility];  Service: Orthopedics;  Laterality: Right;  Marland Kitchen VAGINAL HYSTERECTOMY Bilateral 05/13/2015   Procedure: HYSTERECTOMY VAGINAL WITH BILATERAL SALPINGO OOPHERECTOMY;  Surgeon: Brayton Mars, MD;  Location: [facility];  Service: Gynecology;  Laterality: Bilateral;  . VAGINAL HYSTERECTOMY     bso ant repair    Family History  Problem Relation Age of Onset  . Glaucoma Mother   . Hypertension Mother   . Thyroid disease Mother   . Stroke Father   .  Hypertension Father   . Bipolar disorder Sister   . Breast cancer Paternal Aunt        60's  . Cancer Neg Hx   . Diabetes Neg Hx     Social History Social History   Tobacco Use  . Smoking status: Former Smoker    Packs/day: 1.00    Years: 35.00    Pack years: 35.00    Types: Cigarettes    Last attempt to quit: 02/10/2008    Years since quitting: 9.8  . Smokeless tobacco: Never Used  Substance Use Topics  . Alcohol use: Yes    Alcohol/week: 0.0 standard drinks    Comment: rare  . Drug use: No    Allergies  Allergen Reactions  . Sulfamethoxazole-Trimethoprim Other (See Comments)    GI Upset  . Citalopram Hives  . Tape Itching    Current Outpatient Medications  Medication  Sig Dispense Refill  . acetaminophen (TYLENOL) 500 MG tablet Take 1,000 mg by mouth every 6 (six) hours as needed for fever.     . Alpha-Lipoic Acid 600 MG CAPS Take by mouth.    Marland Kitchen amLODipine (NORVASC) 5 MG tablet Take 1 tablet (5 mg total) by mouth daily. 90 tablet 1  . aspirin EC 81 MG tablet Take 81 mg by mouth daily.    Marland Kitchen atorvastatin (LIPITOR) 40 MG tablet Take 1 tablet (40 mg total) by mouth daily. 90 tablet 1  . Biotin (BIOTIN MAXIMUM STRENGTH) 10 MG TABS Take 1 tablet by mouth daily.    Marland Kitchen buPROPion (WELLBUTRIN) 75 MG tablet Take 1 tablet (75 mg total) by mouth every morning. 10 tablet 0  . chlorthalidone (HYGROTON) 25 MG tablet TAKE ONE TABLET EVERY DAY 90 tablet 1  . cholecalciferol (VITAMIN D) 1000 UNITS tablet Take 1,000 Units by mouth daily.     Marland Kitchen docusate sodium (COLACE) 100 MG capsule Take 100 mg by mouth 2 (two) times daily as needed for mild constipation.     . ferrous sulfate 325 (65 FE) MG tablet Take 325 mg by mouth daily.    Marland Kitchen gabapentin (NEURONTIN) 300 MG capsule Take 300 mg by mouth at bedtime.    . Glycerin-Polysorbate 80 (REFRESH DRY EYE THERAPY OP) Apply to eye.    . losartan (COZAAR) 100 MG tablet TAKE ONE TABLET BY MOUTH EVERY DAY 90 tablet 1  . magnesium oxide (MAG-OX) 400 MG tablet Take 400 mg by mouth daily.    . Multiple Vitamins-Minerals (MULTIVITAMIN PO) Take 1 tablet by mouth daily.    . mupirocin ointment (BACTROBAN) 2 % Place 1 application into the nose 2 (two) times daily. 22 g 0  . pantoprazole (PROTONIX) 40 MG tablet TAKE ONE TABLET BY MOUTH EVERY DAY 60 tablet 3  . potassium chloride (K-DUR) 10 MEQ tablet Take 1 tablet (10 mEq total) by mouth daily. 90 tablet 1  . Probiotic Product (FLORAJEN3 PO) Take 1 tablet by mouth.    . ranitidine (ZANTAC) 150 MG tablet Take 150 mg by mouth 2 (two) times daily.    . predniSONE (STERAPRED UNI-PAK 21 TAB) 10 MG (21) TBPK tablet Take six tablets first day, decreasing by one tablet daily. ( 6,5,4,3,2,1 21 tablet 0   No  current facility-administered medications for this visit.     Review of Systems Review of Systems  Constitutional: Negative.   Respiratory: Negative.   Cardiovascular: Negative.     Blood pressure 135/85, pulse 65, temperature 97.9 F (36.6 C), temperature source Skin, resp. rate 16,  height 5\' 6"  (1.676 m), weight 131 lb (59.4 kg), SpO2 97 %.  Physical Exam Physical Exam  Constitutional: She is oriented to person, place, and time. She appears well-developed and well-nourished.  HENT:  Mouth/Throat: Oropharynx is clear and moist.  Eyes: Conjunctivae are normal.  Neck: Neck supple.  Pulmonary/Chest: Right breast exhibits inverted nipple, skin change and tenderness.    Lymphadenopathy:    She has no axillary adenopathy.  Neurological: She is alert and oriented to person, place, and time.  Skin: Skin is warm and dry.  Psychiatric: Her behavior is normal.    Data Reviewed Vacuum biopsy results of November 11, 2017:  Breast, right, needle core biopsy, 10 o'clock - ORGANIZING FAT NECROSIS. FIBROCYSTIC CHANGES, TO INCLUDE ECTATIC DUCTS, SOME OF WHICH ARE RUPTURED WITH A PROMINENT GRANULOMATOUS RESPONSE.  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    Plan    Prednisone 6 day taper, starting at 60 mg day 1.  Hold off on flu shot until 1 week after having completed steroid.    We will arrange for a follow-up examination on December 04, 2017.  HPI, Physical Exam, Assessment and Plan have been scribed under the direction and in the presence of Robert Bellow, MD. Sheri Fetch, RN  I have completed the exam and reviewed the above documentation for accuracy and completeness.  I agree with the above.  Haematologist has been used and any errors in dictation or transcription are unintentional.  Hervey Ard, M.D., F.A.C.S.   [name] 11/26/2017, 1:29 PM

## 2017-11-25 NOTE — Assessment & Plan Note (Signed)
Follow cbc.  

## 2017-11-25 NOTE — Assessment & Plan Note (Signed)
Seeing psychiatry.  Did not tolerate remeron.  Just started wellbutrin.  Continue f/u with psychiatry.  Discussed with her today.

## 2017-11-25 NOTE — Assessment & Plan Note (Signed)
On lipitor.  Low cholesterol diet and exercise.  Follow lipid panel and liver function tests.   

## 2017-11-25 NOTE — Assessment & Plan Note (Signed)
Blood pressure under good control.  Continue same medication regimen.  Follow pressures.  Follow metabolic panel.   

## 2017-11-25 NOTE — Assessment & Plan Note (Signed)
Just started wellbutrin.  Continue f/u with psychiatry.

## 2017-11-25 NOTE — Assessment & Plan Note (Signed)
S/p surgery.  Was followed by Dr Harlow Asa.

## 2017-11-25 NOTE — Assessment & Plan Note (Addendum)
Physical today 11/25/17.  Mammogram 09/17/17 - Birads II.  Seeing Dr Bary Castilla for f/u.  Colonoscopy 09/15/13.  Per note, recommended f/u colonoscopy in 2025.

## 2017-11-26 DIAGNOSIS — N641 Fat necrosis of breast: Secondary | ICD-10-CM | POA: Insufficient documentation

## 2017-12-01 ENCOUNTER — Encounter: Payer: Self-pay | Admitting: General Surgery

## 2017-12-01 ENCOUNTER — Ambulatory Visit (INDEPENDENT_AMBULATORY_CARE_PROVIDER_SITE_OTHER): Payer: Medicare Other | Admitting: General Surgery

## 2017-12-01 ENCOUNTER — Encounter: Payer: Self-pay | Admitting: Psychiatry

## 2017-12-01 ENCOUNTER — Other Ambulatory Visit: Payer: Self-pay

## 2017-12-01 ENCOUNTER — Ambulatory Visit (INDEPENDENT_AMBULATORY_CARE_PROVIDER_SITE_OTHER): Payer: No Typology Code available for payment source | Admitting: Psychiatry

## 2017-12-01 VITALS — BP 148/91 | HR 59 | Temp 97.5°F | Wt 129.6 lb

## 2017-12-01 VITALS — BP 118/68 | HR 66 | Temp 97.7°F | Resp 14 | Ht 66.0 in | Wt 129.0 lb

## 2017-12-01 DIAGNOSIS — F411 Generalized anxiety disorder: Secondary | ICD-10-CM | POA: Diagnosis not present

## 2017-12-01 DIAGNOSIS — F331 Major depressive disorder, recurrent, moderate: Secondary | ICD-10-CM | POA: Diagnosis not present

## 2017-12-01 DIAGNOSIS — F4310 Post-traumatic stress disorder, unspecified: Secondary | ICD-10-CM

## 2017-12-01 DIAGNOSIS — N641 Fat necrosis of breast: Secondary | ICD-10-CM

## 2017-12-01 MED ORDER — BUPROPION HCL 75 MG PO TABS: 75.0000 mg | ORAL_TABLET | Freq: Every morning | ORAL | 1 refills | Status: DC
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
## 2017-12-01 NOTE — Patient Instructions (Addendum)
Return as scheduled 12/07/2017 @ 1;00Pm . The patient is aware to call back for any questions or concerns.

## 2017-12-01 NOTE — Patient Instructions (Signed)
Please call [name]

## 2017-12-01 NOTE — Telephone Encounter (Signed)
Pt scheduled for nonfasting labs
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
## 2017-12-03 ENCOUNTER — Other Ambulatory Visit (INDEPENDENT_AMBULATORY_CARE_PROVIDER_SITE_OTHER): Payer: Medicare Other

## 2017-12-03 DIAGNOSIS — E876 Hypokalemia: Secondary | ICD-10-CM

## 2017-12-03 LAB — POTASSIUM: POTASSIUM: 3.4 meq/L — AB (ref 3.5–5.1)

## 2017-12-07 ENCOUNTER — Other Ambulatory Visit: Payer: Self-pay

## 2017-12-07 ENCOUNTER — Ambulatory Visit: Payer: Medicare Other | Admitting: General Surgery

## 2017-12-07 ENCOUNTER — Encounter: Payer: Self-pay | Admitting: General Surgery

## 2017-12-07 VITALS — BP 145/83 | HR 67 | Temp 97.7°F | Resp 18 | Ht 66.5 in | Wt 130.4 lb

## 2017-12-07 DIAGNOSIS — N6310 Unspecified lump in the right breast, unspecified quadrant: Secondary | ICD-10-CM

## 2017-12-07 DIAGNOSIS — N641 Fat necrosis of breast: Secondary | ICD-10-CM

## 2017-12-07 NOTE — Progress Notes (Signed)
Patient ID: [name], female   DOB: 19-May-1945, 72 y.o.   MRN: [id number]  Chief Complaint  Patient presents with  . Follow-up    Breast swelling    HPI [name] is a 72 y.o. female. Here today for right breast swelling. She states it feels lumpy under her breast.  She denies fever. No nipple drainage. She feels her nipple is a little more inverted.  HPI  Past Medical History:  Diagnosis Date  . Anemia   . Anxiety   . Arthritis   . Burning feet syndrome   . Chronic insomnia   . Complication of anesthesia    "AFFECTS MY MEMORY"  . COPD (chronic obstructive pulmonary disease) ([place])    documented in outside records  . Depression   . Dysrhythmia    PALPITATIONS   . Edema    MILD ANKLE  . GERD (gastroesophageal reflux disease)   . Heart murmur   . History of stomach ulcers   . HOH (hard of hearing)    AIDS  . Hypercholesterolemia   . Hyperparathyroidism, primary ([place])   . Hypertension   . Neuropathy   . Osteoporosis   . Palpitations   . Scoliosis   . Shortness of breath    with exertion  . Tingling    both hands    Past Surgical History:  Procedure Laterality Date  . ANTERIOR CERVICAL DECOMP/DISCECTOMY FUSION  10/28/2011   Procedure: ANTERIOR CERVICAL DECOMPRESSION/DISCECTOMY FUSION 2 LEVELS;  Surgeon: Elaina Hoops, MD;  Location: [place] [facility];  Service: Neurosurgery;  Laterality: N/A;  . BREAST BIOPSY Right 1992   neg  . BREAST SURGERY     RT BREAST CYST REMOVED 15 YRS AGO   . CATARACT EXTRACTION W/PHACO Right 10/30/2014   Procedure: CATARACT EXTRACTION PHACO AND INTRAOCULAR LENS PLACEMENT (IOC);  Surgeon: Birder Robson, MD;  Location: [facility];  Service: Ophthalmology;  Laterality: Right;  US0:40 AP18.2 CDE7.42  [facility] lot #[id number]  . CATARACT EXTRACTION W/PHACO Left 11/27/2014   Procedure: CATARACT EXTRACTION PHACO AND INTRAOCULAR LENS PLACEMENT (IOC);  Surgeon: Birder Robson, MD;  Location: [facility];  Service: Ophthalmology;  Laterality:  Left;  [place] 00:36.9 AP%: 18.9 CDE: 6.96  [place] [facility] #  [id number]  . CYSTOCELE REPAIR N/A 05/13/2015   Procedure: ANTERIOR REPAIR (CYSTOCELE);  Surgeon: Brayton Mars, MD;  Location: [facility];  Service: Gynecology;  Laterality: N/A;  . CYSTOSCOPY  05/13/2015   Procedure: CYSTOSCOPY;  Surgeon: Brayton Mars, MD;  Location: [facility];  Service: Gynecology;;  . DILATION AND CURETTAGE OF UTERUS     X2   . JOINT REPLACEMENT     RT HIP  2013  (PARTIAL)   . PARATHYROIDECTOMY Left 08/25/2012   Procedure: PARATHYROIDECTOMY;  Surgeon: Earnstine Regal, MD;  Location: [facility];  Service: General;  Laterality: Left;  . PITUITARY SURGERY     FOR TUMOR 3 YRS   . TOTAL KNEE ARTHROPLASTY Right 07/18/2014   Procedure: TOTAL KNEE ARTHROPLASTY;  Surgeon: Dereck Leep, MD;  Location: [facility];  Service: Orthopedics;  Laterality: Right;  Marland Kitchen VAGINAL HYSTERECTOMY Bilateral 05/13/2015   Procedure: HYSTERECTOMY VAGINAL WITH BILATERAL SALPINGO OOPHERECTOMY;  Surgeon: Brayton Mars, MD;  Location: [facility];  Service: Gynecology;  Laterality: Bilateral;  . VAGINAL HYSTERECTOMY     bso ant repair    Family History  Problem Relation Age of Onset  . Glaucoma Mother   . Hypertension Mother   . Thyroid disease Mother   .  Stroke Father   . Hypertension Father   . Bipolar disorder Sister   . Breast cancer Paternal Aunt        60's  . Cancer Neg Hx   . Diabetes Neg Hx     Social History Social History   Tobacco Use  . Smoking status: Former Smoker    Packs/day: 1.00    Years: 35.00    Pack years: 35.00    Types: Cigarettes    Last attempt to quit: 02/10/2008    Years since quitting: 9.8  . Smokeless tobacco: Never Used  Substance Use Topics  . Alcohol use: Yes    Alcohol/week: 0.0 standard drinks    Comment: rare  . Drug use: No    Allergies  Allergen Reactions  . Sulfamethoxazole-Trimethoprim Other (See Comments)    GI Upset  . Citalopram Hives  . Tape Itching    Current Outpatient  Medications  Medication Sig Dispense Refill  . acetaminophen (TYLENOL) 500 MG tablet Take 1,000 mg by mouth every 6 (six) hours as needed for fever.     . Alpha-Lipoic Acid 600 MG CAPS Take by mouth.    Marland Kitchen amLODipine (NORVASC) 5 MG tablet Take 1 tablet (5 mg total) by mouth daily. 90 tablet 1  . aspirin EC 81 MG tablet Take 81 mg by mouth daily.    Marland Kitchen atorvastatin (LIPITOR) 40 MG tablet Take 1 tablet (40 mg total) by mouth daily. 90 tablet 1  . Biotin (BIOTIN MAXIMUM STRENGTH) 10 MG TABS Take 1 tablet by mouth daily.    Marland Kitchen buPROPion (WELLBUTRIN) 75 MG tablet Take 1 tablet (75 mg total) by mouth every morning. 30 tablet 1  . chlorthalidone (HYGROTON) 25 MG tablet TAKE ONE TABLET EVERY DAY 90 tablet 1  . cholecalciferol (VITAMIN D) 1000 UNITS tablet Take 1,000 Units by mouth daily.     Marland Kitchen docusate sodium (COLACE) 100 MG capsule Take 100 mg by mouth 2 (two) times daily as needed for mild constipation.     . ferrous sulfate 325 (65 FE) MG tablet Take 325 mg by mouth daily.    Marland Kitchen gabapentin (NEURONTIN) 300 MG capsule Take 300 mg by mouth at bedtime.    . Glycerin-Polysorbate 80 (REFRESH DRY EYE THERAPY OP) Apply to eye.    . losartan (COZAAR) 100 MG tablet TAKE ONE TABLET BY MOUTH EVERY DAY 90 tablet 1  . magnesium oxide (MAG-OX) 400 MG tablet Take 400 mg by mouth daily.    . Multiple Vitamins-Minerals (MULTIVITAMIN PO) Take 1 tablet by mouth daily.    . pantoprazole (PROTONIX) 40 MG tablet TAKE ONE TABLET BY MOUTH EVERY DAY 60 tablet 3  . potassium chloride (K-DUR) 10 MEQ tablet Take 1 tablet (10 mEq total) by mouth daily. 90 tablet 1  . Probiotic Product (FLORAJEN3 PO) Take 1 tablet by mouth.    . ranitidine (ZANTAC) 150 MG tablet Take 150 mg by mouth 2 (two) times daily.     No current facility-administered medications for this visit.     Review of Systems Review of Systems  Constitutional: Negative.   Respiratory: Negative.   Skin: Negative.     Blood pressure (!) 145/83, pulse 67,  temperature 97.7 F (36.5 C), temperature source Temporal, resp. rate 18, height 5' 6.5" (1.689 m), weight 130 lb 6.4 oz (59.1 kg), SpO2 97 %.  Physical Exam Physical Exam  Constitutional: She is oriented to person, place, and time. She appears well-developed and well-nourished.  Eyes: Conjunctivae are normal. No  scleral icterus.  Neck: Normal range of motion.  Cardiovascular: Normal rate, regular rhythm and normal heart sounds.  Pulmonary/Chest: Effort normal and breath sounds normal. Right breast exhibits no inverted nipple, no mass, no nipple discharge, no skin change and no tenderness. Left breast exhibits no inverted nipple, no mass, no nipple discharge, no skin change and no tenderness.  Lymphadenopathy:    She has no cervical adenopathy.  Neurological: She is alert and oriented to person, place, and time.  Skin: Skin is warm and dry.    Data Reviewed CBC dated July 30, 2017 showed a hemoglobin of 14.2, white blood cell count 6200, MCV of 100.  Platelet count 255,000.  Basic metabolic panel of November 22, 2017 showed a minimal depression of the potassium 3.4.  Renal function normal with a creatinine of 0.56 and an estimated GFR of 113.  November 11, 2017 vacuum biopsy of the right breast: - ORGANIZING FAT NECROSIS. FIBROCYSTIC CHANGES, TO INCLUDE ECTATIC DUCTS, SOME OF WHICH ARE RUPTURED WITH A PROMINENT GRANULOMATOUS RESPONSE  Assessment    Persistent prominence of the upper outer quadrant breast mass post steroid treatment.    Plan    I think the patient will be best served by surgical excision of this area to confirm that no other processes overlooked.  While this could be a simple case of granulomatous mastitis, considering the biopsy results and initial response to steroids the increase in prominence and tenderness since the short course of steroids were completed warrants formal excision.  She may require a longer course of steroids after this.   Will have the patient make  use of her present potassium supplements twice daily between now and surgery.     HPI, Physical Exam, Assessment and Plan have been scribed under the direction and in the presence of Robert Bellow, MD. [name], CMA  I have completed the exam and reviewed the above documentation for accuracy and completeness.  I agree with the above.  Haematologist has been used and any errors in dictation or transcription are unintentional.  Hervey Ard, M.D., F.A.C.S.  The patient is scheduled for surgery at [facility] with Dr Bary Castilla on 12/17/17. She will pre admit at the hospital on 12/14/17 at 2:00 pm. The patient is aware of dates, time, and instructions.  Documented by Caryl-Lyn Otis Brace LPN  Forest Gleason Yahira Timberman 12/08/2017, 5:43 PM

## 2017-12-07 NOTE — Patient Instructions (Addendum)
We will schedule you for breast surgery.  The patient is scheduled for surgery at [facility] with Dr Bary Castilla on 12/17/17. She will pre admit at the hospital on 12/14/17 at 2:00 pm. The patient is aware of dates, time, and instructions.

## 2017-12-09 ENCOUNTER — Other Ambulatory Visit: Payer: Self-pay | Admitting: Psychiatry

## 2017-12-10 ENCOUNTER — Other Ambulatory Visit: Payer: Self-pay | Admitting: Psychiatry

## 2017-12-10 ENCOUNTER — Telehealth: Payer: Self-pay

## 2017-12-10 DIAGNOSIS — N6310 Unspecified lump in the right breast, unspecified quadrant: Secondary | ICD-10-CM

## 2017-12-10 HISTORY — DX: Unspecified lump in the right breast, unspecified quadrant: N63.10

## 2017-12-10 NOTE — Telephone Encounter (Signed)
Patient notified and will do this.

## 2017-12-10 NOTE — Telephone Encounter (Signed)
-----   Message from Robert Bellow, MD sent at 12/08/2017  5:50 PM EDT ----- Please request that the patient take her potassium supplement twice a day between now and surgery.  Thank you

## 2017-12-14 ENCOUNTER — Other Ambulatory Visit: Payer: Self-pay

## 2017-12-14 ENCOUNTER — Encounter
Admission: RE | Admit: 2017-12-14 | Discharge: 2017-12-14 | Disposition: A | Discharge status: Home / Self Care | Payer: Medicare Other | Source: Ambulatory Visit | Attending: General Surgery | Admitting: General Surgery

## 2017-12-14 DIAGNOSIS — Z01818 Encounter for other preprocedural examination: Secondary | ICD-10-CM | POA: Insufficient documentation

## 2017-12-14 DIAGNOSIS — I10 Essential (primary) hypertension: Secondary | ICD-10-CM | POA: Diagnosis not present

## 2017-12-14 HISTORY — DX: Unspecified lump in the right breast, unspecified quadrant: N63.10

## 2017-12-14 LAB — BASIC METABOLIC PANEL
ANION GAP: 9 (ref 5–15)
BUN: 19 mg/dL (ref 8–23)
CO2: 32 mmol/L (ref 22–32)
Calcium: 9.9 mg/dL (ref 8.9–10.3)
Chloride: 101 mmol/L (ref 98–111)
Creatinine, Ser: 0.59 mg/dL (ref 0.44–1.00)
GFR calc Af Amer: >60 mL/min (ref >60)
GFR calc non Af Amer: >60 mL/min (ref >60)
GLUCOSE: 93 mg/dL (ref 70–99)
POTASSIUM: 3.3 mmol/L — AB (ref 3.5–5.1)
Sodium: 142 mmol/L (ref 135–145)

## 2017-12-14 LAB — CBC
HEMATOCRIT: 43.7 % (ref 36.0–46.0)
Hemoglobin: 14.3 g/dL (ref 12.0–15.0)
MCH: 32.8 pg (ref 26.0–34.0)
MCHC: 32.7 g/dL (ref 30.0–36.0)
MCV: 100.2 fL — AB (ref 80.0–100.0)
NRBC: 0 % (ref 0.0–0.2)
Platelets: 300 10*3/uL (ref 150–400)
RBC: 4.36 MIL/uL (ref 3.87–5.11)
RDW: 12.9 % (ref 11.5–15.5)
WBC: 6.5 10*3/uL (ref 4.0–10.5)

## 2017-12-14 NOTE — Patient Instructions (Addendum)
Your procedure is scheduled on: Friday, November 8TH  Report to [facility]    DO NOT STOP ON THE FIRST FLOOR TO REGISTER  To find out your arrival time please call [phone] between 1PM - 3PM on Thursday, November 7TH  Remember: Instructions that are not followed completely may result in serious medical risk,  up to and including death, or upon the discretion of your surgeon and anesthesiologist your  surgery may need to be rescheduled.     _X__ 1. Do not eat food after midnight the night before your procedure.                 No gum chewing or hard candies.                   ABSOLUTELY NOTHING SOLID IN YOUR MOUTH AFTER MIDNIGHT                  You may drink clear liquids up to 2 hours before you are scheduled to arrive for your surgery-                  DO not drink clear liquids within 2 hours of the start of your surgery.                  Clear Liquids include:  water, apple juice without pulp, clear carbohydrate                 drink such as Clearfast of Gatorade, Black Coffee or Tea (Do not add                 anything to coffee or tea).  You may add sugar but no dairy products, honey or lemon  __X__2.  On the morning of surgery brush your teeth with toothpaste and water,                   You may rinse your mouth with mouthwash if you wish.                      Do not swallow any toothpaste of mouthwash.     _X__ 3.  No Alcohol for 24 hours before or after surgery.   _X__ 4.  Do Not Smoke or use e-cigarettes For 24 Hours Prior to Your Surgery.                 Do not use any chewable tobacco products for at least 6 hours prior to                 surgery.  ____  5.  Bring all medications with you on the day of surgery if instructed.   ____  6.  Notify your doctor if there is any change in your medical condition      (cold, fever, infections).     Do not wear jewelry, make-up, hairpins, clips or nail polish. Do not wear  lotions, powders, or perfumes. You may NOT wear deodorant. Do not shave 48 hours prior to surgery. Men may shave face and neck. Do not bring valuables to the hospital.    [facility] is not responsible for any belongings or valuables.  Contacts, dentures or bridgework may not be worn into surgery. Leave your suitcase in the car. After surgery it may be brought to your room. For patients admitted to the hospital, discharge time is determined by  your treatment team.   Patients discharged the day of surgery will not be allowed to drive home.   Please read over the following fact sheets that you were given:   PREPARING FOR SURGERY   __X__ Take these medicines the morning of surgery with A SIP OF WATER:    1. PRILOSEC/PANTOPRAZOLE .Marland Kitchen TAKE AN EXTRA DOSE THE NIGHT BEFORE SURGERY  2. BUPROPION/WELBUTRIN  3. EYE DROPS  4.  5.  6.  ____ Fleet Enema (as directed)   ___X_ Use CHG Soap as directed  __X__ Stop ALL ASPIRIN PRODUCTS AS OF TODAY  __X__ Stop Anti-inflammatories AS OF TODAY   ____ Stop supplements until after surgery. YOU MAY CONTINUE TO TAKE VITAMIN D / FERROUS SULFATE /                    MAGNESIUM / K-DUR (POTASSIUM) BUT DO NOT TAKE THESE ON THE MORNING OF SURGERY   ____ Bring C-Pap to the hospital.   [place] ACID               ASPIRIN               BIOTIN               KRILL OIL               FLORA JEN PROBIIOTIC  DO NOT TAKE ON THE MORNING OF SURGERY               HYGROTON / CHLORTHALIDONE               LOSARTAN               POTASSIUM/ K-DUR               ALL VITAMINS AS PREVIOUSLY LISTED  YOU MIGHT NEED TO ADD ANOTHER OF STOOL SOFTENERS IF TAKING      NARCOTIC PAIN MEDICINE.  CONTINUE TAKING ALL EVENING MEDICINES AS USUAL.  WEAR A COMFORTABLE SHIRT TO THE HOSPITAL ON THE DAY OF SURGERY.     YOU WANT TO BE ABLE TO GET YOUR ARMS IN IT EASILY.

## 2017-12-15 ENCOUNTER — Telehealth: Payer: Self-pay

## 2017-12-15 NOTE — Telephone Encounter (Signed)
-----   Message from Robert Bellow, MD sent at 12/15/2017  4:25 PM EST ----- Have her take double the amount of her KCL between now and surgery. Thanks.  ----- Message ----- From: Buel Ream, [facility] [place] Sent: 12/14/2017   5:26 PM EST To: Robert Bellow, MD

## 2017-12-15 NOTE — Pre-Procedure Instructions (Signed)
Met B results sent to Dr. Bary Castilla and Anesthesia for review.

## 2017-12-16 MED ORDER — CEFAZOLIN SODIUM-DEXTROSE 2-4 GM/100ML-% IV SOLN
2.0000 g | INTRAVENOUS | Status: AC
  Administered 2017-12-17: 2 g via INTRAVENOUS

## 2017-12-16 NOTE — Telephone Encounter (Signed)
Patient aware and will double up on what she was taking. She is now taking 40Meq of KCL twice a day until surgery.

## 2017-12-17 ENCOUNTER — Ambulatory Visit: Payer: Medicare Other | Admitting: Registered Nurse

## 2017-12-17 ENCOUNTER — Encounter
Admission: RE | Disposition: A | Discharge status: Home / Self Care | Payer: Self-pay | Source: Ambulatory Visit | Attending: General Surgery

## 2017-12-17 ENCOUNTER — Encounter: Payer: Self-pay | Admitting: *Deleted

## 2017-12-17 ENCOUNTER — Ambulatory Visit
Admission: RE | Admit: 2017-12-17 | Discharge: 2017-12-17 | Disposition: A | Discharge status: Home / Self Care | Payer: Medicare Other | Source: Ambulatory Visit | Attending: General Surgery | Admitting: General Surgery

## 2017-12-17 ENCOUNTER — Other Ambulatory Visit: Payer: Self-pay

## 2017-12-17 DIAGNOSIS — M810 Age-related osteoporosis without current pathological fracture: Secondary | ICD-10-CM | POA: Insufficient documentation

## 2017-12-17 DIAGNOSIS — J449 Chronic obstructive pulmonary disease, unspecified: Secondary | ICD-10-CM | POA: Insufficient documentation

## 2017-12-17 DIAGNOSIS — Z87891 Personal history of nicotine dependence: Secondary | ICD-10-CM | POA: Diagnosis not present

## 2017-12-17 DIAGNOSIS — Z79899 Other long term (current) drug therapy: Secondary | ICD-10-CM | POA: Diagnosis not present

## 2017-12-17 DIAGNOSIS — N641 Fat necrosis of breast: Secondary | ICD-10-CM | POA: Diagnosis not present

## 2017-12-17 DIAGNOSIS — G629 Polyneuropathy, unspecified: Secondary | ICD-10-CM | POA: Insufficient documentation

## 2017-12-17 DIAGNOSIS — F419 Anxiety disorder, unspecified: Secondary | ICD-10-CM | POA: Diagnosis not present

## 2017-12-17 DIAGNOSIS — I10 Essential (primary) hypertension: Secondary | ICD-10-CM | POA: Insufficient documentation

## 2017-12-17 DIAGNOSIS — Z7982 Long term (current) use of aspirin: Secondary | ICD-10-CM | POA: Insufficient documentation

## 2017-12-17 DIAGNOSIS — B20 Human immunodeficiency virus [HIV] disease: Secondary | ICD-10-CM | POA: Insufficient documentation

## 2017-12-17 DIAGNOSIS — E7800 Pure hypercholesterolemia, unspecified: Secondary | ICD-10-CM | POA: Diagnosis not present

## 2017-12-17 DIAGNOSIS — F329 Major depressive disorder, single episode, unspecified: Secondary | ICD-10-CM | POA: Insufficient documentation

## 2017-12-17 DIAGNOSIS — N6021 Fibroadenosis of right breast: Secondary | ICD-10-CM | POA: Insufficient documentation

## 2017-12-17 DIAGNOSIS — N6041 Mammary duct ectasia of right breast: Secondary | ICD-10-CM | POA: Insufficient documentation

## 2017-12-17 DIAGNOSIS — N6310 Unspecified lump in the right breast, unspecified quadrant: Secondary | ICD-10-CM

## 2017-12-17 DIAGNOSIS — K219 Gastro-esophageal reflux disease without esophagitis: Secondary | ICD-10-CM | POA: Diagnosis not present

## 2017-12-17 DIAGNOSIS — Z96651 Presence of right artificial knee joint: Secondary | ICD-10-CM | POA: Diagnosis not present

## 2017-12-17 DIAGNOSIS — F418 Other specified anxiety disorders: Secondary | ICD-10-CM | POA: Diagnosis not present

## 2017-12-17 DIAGNOSIS — D649 Anemia, unspecified: Secondary | ICD-10-CM | POA: Diagnosis not present

## 2017-12-17 DIAGNOSIS — N6311 Unspecified lump in the right breast, upper outer quadrant: Secondary | ICD-10-CM | POA: Diagnosis not present

## 2017-12-17 HISTORY — PX: BREAST BIOPSY: SHX20

## 2017-12-17 LAB — POCT I-STAT 4, (NA,K, GLUC, HGB,HCT)
Glucose, Bld: 90 mg/dL (ref 70–99)
HCT: 43 % (ref 36.0–46.0)
Hemoglobin: 14.6 g/dL (ref 12.0–15.0)
Potassium: 3 mmol/L — ABNORMAL LOW (ref 3.5–5.1)
Sodium: 138 mmol/L (ref 135–145)

## 2017-12-17 SURGERY — BREAST BIOPSY
Anesthesia: General | Laterality: Right

## 2017-12-17 MED ORDER — LIDOCAINE HCL (PF) 2 % IJ SOLN
INTRAMUSCULAR | Status: AC
  Filled 2017-12-17: qty 10

## 2017-12-17 MED ORDER — ONDANSETRON HCL 4 MG/2ML IJ SOLN: 4.0000 mg | Freq: Once | INTRAMUSCULAR | Status: DC | PRN

## 2017-12-17 MED ORDER — ONDANSETRON HCL 4 MG/2ML IJ SOLN
INTRAMUSCULAR | Status: DC | PRN
  Administered 2017-12-17: 4 mg via INTRAVENOUS

## 2017-12-17 MED ORDER — PROPOFOL 10 MG/ML IV BOLUS
INTRAVENOUS | Status: AC
  Filled 2017-12-17: qty 20

## 2017-12-17 MED ORDER — ONDANSETRON HCL 4 MG/2ML IJ SOLN
INTRAMUSCULAR | Status: AC
  Filled 2017-12-17: qty 2

## 2017-12-17 MED ORDER — HYDROCODONE-ACETAMINOPHEN 5-325 MG PO TABS
1.0000 | ORAL_TABLET | ORAL | Status: DC | PRN
  Administered 2017-12-17: 1 via ORAL

## 2017-12-17 MED ORDER — LIDOCAINE HCL (CARDIAC) PF 100 MG/5ML IV SOSY
PREFILLED_SYRINGE | INTRAVENOUS | Status: DC | PRN
  Administered 2017-12-17: 60 mg via INTRAVENOUS

## 2017-12-17 MED ORDER — FENTANYL CITRATE (PF) 100 MCG/2ML IJ SOLN
INTRAMUSCULAR | Status: AC
  Filled 2017-12-17: qty 2

## 2017-12-17 MED ORDER — BUPIVACAINE-EPINEPHRINE (PF) 0.5% -1:200000 IJ SOLN
INTRAMUSCULAR | Status: DC | PRN
  Administered 2017-12-17: 30 mL

## 2017-12-17 MED ORDER — HYDROCODONE-ACETAMINOPHEN 5-325 MG PO TABS
ORAL_TABLET | ORAL | Status: AC
  Filled 2017-12-17: qty 1

## 2017-12-17 MED ORDER — ACETAMINOPHEN 10 MG/ML IV SOLN
INTRAVENOUS | Status: AC
  Filled 2017-12-17: qty 100

## 2017-12-17 MED ORDER — DEXAMETHASONE SODIUM PHOSPHATE 10 MG/ML IJ SOLN
INTRAMUSCULAR | Status: DC | PRN
  Administered 2017-12-17: 10 mg via INTRAVENOUS

## 2017-12-17 MED ORDER — DEXAMETHASONE SODIUM PHOSPHATE 10 MG/ML IJ SOLN
INTRAMUSCULAR | Status: AC
  Filled 2017-12-17: qty 1

## 2017-12-17 MED ORDER — FENTANYL CITRATE (PF) 100 MCG/2ML IJ SOLN
INTRAMUSCULAR | Status: DC | PRN
  Administered 2017-12-17 (×2): 25 ug via INTRAVENOUS

## 2017-12-17 MED ORDER — CEFAZOLIN SODIUM-DEXTROSE 2-4 GM/100ML-% IV SOLN
INTRAVENOUS | Status: AC
  Filled 2017-12-17: qty 100

## 2017-12-17 MED ORDER — PROPOFOL 10 MG/ML IV BOLUS
INTRAVENOUS | Status: DC | PRN
  Administered 2017-12-17: 150 mg via INTRAVENOUS

## 2017-12-17 MED ORDER — HYDROCODONE-ACETAMINOPHEN 5-325 MG PO TABS: 1.0000 | ORAL_TABLET | ORAL | 0 refills | Status: DC | PRN

## 2017-12-17 MED ORDER — GLYCOPYRROLATE 0.2 MG/ML IJ SOLN
INTRAMUSCULAR | Status: DC | PRN
  Administered 2017-12-17: 0.2 mg via INTRAVENOUS

## 2017-12-17 MED ORDER — LACTATED RINGERS IV SOLN
INTRAVENOUS | Status: DC
  Administered 2017-12-17: 10:00:00 via INTRAVENOUS

## 2017-12-17 MED ORDER — EPHEDRINE SULFATE 50 MG/ML IJ SOLN
INTRAMUSCULAR | Status: DC | PRN
  Administered 2017-12-17 (×2): 10 mg via INTRAVENOUS

## 2017-12-17 MED ORDER — ACETAMINOPHEN 10 MG/ML IV SOLN
INTRAVENOUS | Status: DC | PRN
  Administered 2017-12-17: 1000 mg via INTRAVENOUS

## 2017-12-17 MED ORDER — BUPIVACAINE-EPINEPHRINE (PF) 0.5% -1:200000 IJ SOLN
INTRAMUSCULAR | Status: AC
  Filled 2017-12-17: qty 30

## 2017-12-17 MED ORDER — FENTANYL CITRATE (PF) 100 MCG/2ML IJ SOLN: 25.0000 ug | INTRAMUSCULAR | Status: DC | PRN

## 2017-12-17 SURGICAL SUPPLY — 45 items
BINDER BREAST LRG (GAUZE/BANDAGES/DRESSINGS) ×1 IMPLANT
BINDER BREAST MEDIUM (GAUZE/BANDAGES/DRESSINGS) IMPLANT
BINDER BREAST XLRG (GAUZE/BANDAGES/DRESSINGS) IMPLANT
BINDER BREAST XXLRG (GAUZE/BANDAGES/DRESSINGS) IMPLANT
BLADE SURG 15 STRL SS SAFETY (BLADE) ×3 IMPLANT
CANISTER SUCT 1200ML W/VALVE (MISCELLANEOUS) ×2 IMPLANT
CHLORAPREP W/TINT 26ML (MISCELLANEOUS) ×2 IMPLANT
CNTNR SPEC 2.5X3XGRAD LEK (MISCELLANEOUS) ×1
CONT SPEC 4OZ STER OR WHT (MISCELLANEOUS) ×1
CONTAINER SPEC 2.5X3XGRAD LEK (MISCELLANEOUS) ×1 IMPLANT
COVER PROBE FLX POLY STRL (MISCELLANEOUS) ×2 IMPLANT
COVER WAND RF STERILE (DRAPES) ×1 IMPLANT
DEVICE DUBIN SPECIMEN MAMMOGRA (MISCELLANEOUS) ×2 IMPLANT
DRAPE LAPAROTOMY 100X77 ABD (DRAPES) ×2 IMPLANT
DRSG GAUZE FLUFF 36X18 (GAUZE/BANDAGES/DRESSINGS) ×2 IMPLANT
DRSG TELFA 4X3 1S NADH ST (GAUZE/BANDAGES/DRESSINGS) ×2 IMPLANT
ELECT CAUTERY BLADE TIP 2.5 (TIP) ×2
ELECT REM PT RETURN 9FT ADLT (ELECTROSURGICAL) ×2
ELECTRODE CAUTERY BLDE TIP 2.5 (TIP) ×1 IMPLANT
ELECTRODE REM PT RTRN 9FT ADLT (ELECTROSURGICAL) ×1 IMPLANT
GLOVE BIO SURGEON STRL SZ7.5 (GLOVE) ×4 IMPLANT
GLOVE INDICATOR 8.0 STRL GRN (GLOVE) ×4 IMPLANT
GOWN STRL REUS W/ TWL LRG LVL3 (GOWN DISPOSABLE) ×2 IMPLANT
GOWN STRL REUS W/TWL LRG LVL3 (GOWN DISPOSABLE) ×3
KIT TURNOVER KIT A (KITS) ×2 IMPLANT
LABEL OR SOLS (LABEL) ×2 IMPLANT
MARGIN MAP 10MM (MISCELLANEOUS) ×2 IMPLANT
NDL HYPO 25X1 1.5 SAFETY (NEEDLE) ×1 IMPLANT
NEEDLE HYPO 22GX1.5 SAFETY (NEEDLE) ×2 IMPLANT
NEEDLE HYPO 25X1 1.5 SAFETY (NEEDLE) ×2 IMPLANT
PACK BASIN MINOR ARMC (MISCELLANEOUS) ×2 IMPLANT
PAD ABD DERMACEA PRESS 5X9 (GAUZE/BANDAGES/DRESSINGS) ×2 IMPLANT
RETRACTOR RING XSMALL (MISCELLANEOUS) ×1 IMPLANT
RTRCTR WOUND ALEXIS 13CM XS SH (MISCELLANEOUS)
SHEARS HARMONIC 9CM CVD (BLADE) ×1 IMPLANT
STRIP CLOSURE SKIN 1/2X4 (GAUZE/BANDAGES/DRESSINGS) ×2 IMPLANT
SUT ETHILON 3-0 FS-10 30 BLK (SUTURE) ×2
SUT VIC AB 2-0 CT1 27 (SUTURE) ×2
SUT VIC AB 2-0 CT1 TAPERPNT 27 (SUTURE) ×2 IMPLANT
SUT VIC AB 4-0 FS2 27 (SUTURE) ×2 IMPLANT
SUTURE EHLN 3-0 FS-10 30 BLK (SUTURE) ×1 IMPLANT
SWABSTK COMLB BENZOIN TINCTURE (MISCELLANEOUS) ×2 IMPLANT
SYR 10ML LL (SYRINGE) ×2 IMPLANT
TAPE TRANSPORE STRL 2 31045 (GAUZE/BANDAGES/DRESSINGS) ×1 IMPLANT
WATER STERILE IRR 1000ML POUR (IV SOLUTION) ×2 IMPLANT

## 2017-12-17 NOTE — Discharge Instructions (Addendum)
Breast Biopsy, Care After These instructions give you information about caring for yourself after your procedure. Your doctor may also give you more specific instructions. Call your doctor if you have any problems or questions after your procedure. Follow these instructions at home: Medicines  Take over-the-counter and prescription medicines only as told by your doctor.  Do not drive for 24 hours if you received a sedative.  Do not drink alcohol while taking pain medicine.  Do not drive or use heavy machinery while taking prescription pain medicine. Biopsy Site Care   Follow instructions from your doctor about how to take care of your cut from surgery (incision) or puncture area. Make sure you: ? Wash your hands with soap and water before you change your bandage. If you cannot use soap and water, use hand sanitizer. ? Change any bandages (dressings) as told by your doctor. ? Leave any stitches (sutures), skin glue, or skin tape (adhesive) strips in place. They may need to stay in place for 2 weeks or longer. If tape strips get loose and curl up, you may trim the loose edges. Do not remove tape strips completely unless your doctor says it is okay.  If you have stitches, keep them dry when you take a bath or a shower.  Check your cut or puncture area every day for signs of infection. Check for: ? More redness, swelling, or pain. ? More fluid or blood. ? Warmth. ? Pus or a bad smell.  Protect the biopsy area. Do not let the area get bumped. Activity  Avoid activities that could pull the biopsy site open. ? Avoid stretching. ? Avoid reaching. ? Avoid exercise. ? Avoid sports. ? Avoid lifting anything that is heavier than 3 pounds (1.4 kg).  Return to your normal activities as told by your doctor. Ask your doctor what activities are safe for you. General instructions  Continue your normal diet.  Wear a good support bra for as long as told by your doctor.  Get checked for extra  fluid in your body (lymphedema) as often as told by your doctor.  Keep all follow-up visits as told by your doctor. This is important. Contact a health care provider if:  You have more redness, swelling, or pain at the biopsy site.  You have more fluid or blood coming from your biopsy site.  Your biopsy site feels warm to the touch.  You have pus or a bad smell coming from the biopsy site.  Your biopsy site breaks open after the stitches, staples, or skin tape strips have been removed.  You have a rash.  You have a fever. Get help right away if:  You have more bleeding (more than a small spot) from the biopsy site.  You have trouble breathing.  You have red streaks around the biopsy site. This information is not intended to replace advice given to you by your health care provider. Make sure you discuss any questions you have with your health care provider.  AMBULATORY SURGERY  DISCHARGE INSTRUCTIONS   1) The drugs that you were given will stay in your system until tomorrow so for the next 24 hours you should not:  A) Drive an automobile B) Make any legal decisions C) Drink any alcoholic beverage   2) You may resume regular meals tomorrow.  Today it is better to start with liquids and gradually work up to solid foods.  You may eat anything you prefer, but it is better to start with liquids, then  soup and crackers, and gradually work up to solid foods.   3) Please notify your doctor immediately if you have any unusual bleeding, trouble breathing, redness and pain at the surgery site, drainage, fever, or pain not relieved by medication.    4) Additional Instructions:        Please contact your physician with any problems or Same Day Surgery at [phone], Monday through Friday 6 am to 4 pm, or [place] at [facility] number at [phone]. Document Released: 11/22/2008 Document Revised: 10/03/2015 Document Reviewed: 10/30/2014 Elsevier Interactive Patient  Education  2018 [employer].

## 2017-12-17 NOTE — Anesthesia Post-op Follow-up Note (Signed)
Anesthesia QCDR form completed.        

## 2017-12-17 NOTE — H&P (Signed)
No change in clinical history or exam. Prior biopsy: Breast, right, needle core biopsy, 10 o'clock - ORGANIZING FAT NECROSIS. FIBROCYSTIC CHANGES, TO INCLUDE ECTATIC DUCTS, SOME OF WHICH ARE RUPTURED WITH A PROMINENT GRANULOMATOUS RESPONSE. With persistent prominence of the UOQ, will proceed to formal excision.  For right breast excisional biopsy.

## 2017-12-17 NOTE — Op Note (Signed)
Preoperative diagnosis: Right breast mass, core biopsy showing fat necrosis.  Postoperative diagnosis: Same.  Operative procedure: Excision of right upper outer quadrant breast mass.  Operating Surgeon: Hervey Ard, MD.  Anesthesia: General by LMA, Marcaine 0.5% with 1 to 200,000 units of epinephrine, 30 cc.  Estimated blood loss: 15 cc.  Clinical note: 72-year-old woman presented with a mass in the upper outer quadrant of the left breast.  Mammogram and ultrasound were confusing and she underwent a core biopsy showing evidence of fat necrosis.  There is no clear history of trauma.  It was elected to bring the patient to the operating for formal excision of this area.  In the office, seated position this is more 10 o'clock position, but when the patient was supine on the operating table this was the 9-11 o'clock position.  Operative note: The patient underwent general anesthesia and tolerated this well.  Local anesthesia was infiltrated for postoperative comfort.  20 cc views of the beginning of the case, the last 10 cc of the end of the case.  A radial incision from the edge of the areola was made at the 9 o'clock position.  Skin was incised sharply and the adipose layer divided.  The subcutaneous fat was elevated off the breast parenchyma from the 9 to 11 o'clock position to encompass the majority of this firm area of concern.  Hemostasis was with electrocautery.  The block of tissue was orientated and sent for fresh to pathology per protocol.  There was one small area of creamy yellow drainage from the inferior aspect of the wound noted during the procedure.  No other microabscesses noted.  After assuring good hemostasis the wound was approximated with 3 layers of 2-0 Vicryl figure-of-eight sutures.  The skin was closed with a running 4-0 Vicryl subcuticular suture.  Benzoin Steri-Strips followed by Telfa and fluff gauze applied.  A compressive wrap was then placed.  The patient tolerated the  procedure well was taken recovery room in stable condition.

## 2017-12-17 NOTE — Transfer of Care (Signed)
Immediate Anesthesia Transfer of Care Note  Patient: [name]  Procedure(s) Performed: Procedure(s): BREAST BIOPSY (Right)  Patient Location: PACU  Anesthesia Type:General  Level of Consciousness: sedated  Airway & Oxygen Therapy: Patient Spontanous Breathing and Patient connected to face mask oxygen  Post-op Assessment: Report given to RN and Post -op Vital signs reviewed and stable  Post vital signs: Reviewed and stable  Last Vitals:  Vitals:   12/17/17 1010 12/17/17 1246  BP: (!) 146/93 (!) 106/55  Pulse: (!) 50 69  Resp: 16 16  Temp: (!) 36.2 C (!) 36.1 C  SpO2: 68% 957%    Complications: No apparent anesthesia complications

## 2017-12-17 NOTE — Anesthesia Procedure Notes (Signed)
Procedure Name: LMA Insertion Date/Time: 12/17/2017 11:48 AM Performed by: Doreen Salvage, CRNA Pre-anesthesia Checklist: Patient identified, Patient being monitored, Timeout performed, Emergency Drugs available and Suction available Patient Re-evaluated:Patient Re-evaluated prior to induction Oxygen Delivery Method: Circle system utilized Preoxygenation: Pre-oxygenation with 100% oxygen Induction Type: IV induction Ventilation: Mask ventilation without difficulty LMA: LMA inserted LMA Size: 4.0 Tube type: Oral Number of attempts: 1 Placement Confirmation: positive ETCO2 and breath sounds checked- equal and bilateral Tube secured with: Tape Dental Injury: Teeth and Oropharynx as per pre-operative assessment

## 2017-12-17 NOTE — Anesthesia Preprocedure Evaluation (Signed)
Anesthesia Evaluation  Patient identified by MRN, date of birth, ID band Patient awake    Reviewed: Allergy & Precautions, H&P , NPO status , Patient's Chart, lab work & pertinent test results  History of Anesthesia Complications (+) history of anesthetic complications  Airway Mallampati: II  TM Distance: >3 FB Neck ROM: full    Dental  (+) Teeth Intact, Dental Advidsory Given, Caps   Pulmonary shortness of breath and with exertion, neg sleep apnea, COPD, neg recent URI, former smoker,    breath sounds clear to auscultation + decreased breath sounds      Cardiovascular hypertension, Pt. on medications (-) angina(-) CAD, (-) Past MI, (-) Cardiac Stents and (-) CABG + dysrhythmias + Valvular Problems/Murmurs      Neuro/Psych neg Seizures PSYCHIATRIC DISORDERS Anxiety Depression  Neuromuscular disease    GI/Hepatic Neg liver ROS, GERD  Controlled,  Endo/Other  negative endocrine ROS  Renal/GU negative Renal ROS  negative genitourinary   Musculoskeletal  (+) Arthritis ,   Abdominal   Peds  Hematology negative hematology ROS (+) Blood dyscrasia, anemia ,   Anesthesia Other Findings Past Medical History:   Hypertension                                                 Anxiety                                                      Depression                                                   GERD (gastroesophageal reflux disease)                       Hypercholesterolemia                                         Chronic insomnia                                             COPD (chronic obstructive pulmonary disease) (*                Comment:documented in outside records   Arthritis                                                    Heart murmur                                                 Osteoporosis  Shortness of breath                                         Comment:with exertion   Tingling                                                       Comment:both hands   History of stomach ulcers                                    Hyperparathyroidism, primary ([place])                           Dysrhythmia                                                    Comment:PALPITATIONS    Complication of anesthesia                                     Comment:"AFFECTS MY MEMORY"   Burning feet syndrome                                        Neuropathy (HCC)                                             Anemia                                                       HOH (hard of hearing)                                          Comment:AIDS   Scoliosis                                                    Palpitations                                                 Edema  Comment:MILD ANKLE  Past Surgical History:   DILATION AND CURETTAGE OF UTERUS                                Comment:X2    PITUITARY SURGERY                                               Comment:FOR TUMOR 3 YRS    JOINT REPLACEMENT                                               Comment:RT HIP  2013  (PARTIAL)    BREAST SURGERY                                                  Comment:RT BREAST CYST REMOVED 15 YRS AGO    ANTERIOR CERVICAL DECOMP/DISCECTOMY FUSION       10/28/2011      Comment:Procedure: ANTERIOR CERVICAL               DECOMPRESSION/DISCECTOMY FUSION 2 LEVELS;                Surgeon: Elaina Hoops, MD;  Location: [facility]               [facility];  Service: Neurosurgery;  Laterality: N/A;   PARATHYROIDECTOMY                               Left 08/25/2012      Comment:Procedure: PARATHYROIDECTOMY;  Surgeon: Earnstine Regal, MD;  Location: [facility] ORS;  Service:               General;  Laterality: Left;   TOTAL KNEE ARTHROPLASTY                         Right 07/18/2014       Comment:Procedure: TOTAL KNEE ARTHROPLASTY;  Surgeon:                Dereck Leep, MD;  Location: [facility] ORS;                Service: Orthopedics;  Laterality: Right;   BREAST BIOPSY                                   Right 1992           Comment:neg   CATARACT EXTRACTION W/PHACO                     Right 10/30/2014      Comment:Procedure: CATARACT EXTRACTION PHACO AND               INTRAOCULAR LENS PLACEMENT (IOC);  Surgeon:               Birder Robson, MD;  Location: [facility] ORS;                Service: Ophthalmology;  Laterality: Right;                US0:40 AP18.2 CDE7.84  casette lot               #[id number] h  BMI    Body Mass Index   26.64 kg/m 2      Reproductive/Obstetrics negative OB ROS                             Anesthesia Physical  Anesthesia Plan  ASA: III  Anesthesia Plan: General   Post-op Pain Management:    Induction: Intravenous  PONV Risk Score and Plan: 3 and Ondansetron, Dexamethasone and Treatment may vary due to age or medical condition  Airway Management Planned: LMA  Additional Equipment:   Intra-op Plan:   Post-operative Plan: Extubation in OR  Informed Consent: I have reviewed the patients History and Physical, chart, labs and discussed the procedure including the risks, benefits and alternatives for the proposed anesthesia with the patient or authorized representative who has indicated his/her understanding and acceptance.   Dental Advisory Given  Plan Discussed with: CRNA  Anesthesia Plan Comments:         Anesthesia Quick Evaluation

## 2017-12-17 NOTE — Progress Notes (Signed)
Called Dr. Bary Castilla to see if he wanted to see her prior to leaving and he stated that he saw her in PACU and that she was free to go.

## 2017-12-18 NOTE — Anesthesia Postprocedure Evaluation (Signed)
Anesthesia Post Note  Patient: [name]  Procedure(s) Performed: BREAST BIOPSY (Right )  Patient location during evaluation: PACU Anesthesia Type: General Level of consciousness: awake and alert Pain management: pain level controlled Vital Signs Assessment: post-procedure vital signs reviewed and stable Respiratory status: spontaneous breathing, nonlabored ventilation, respiratory function stable and patient connected to nasal cannula oxygen Cardiovascular status: blood pressure returned to baseline and stable Postop Assessment: no apparent nausea or vomiting Anesthetic complications: no     Last Vitals:  Vitals:   12/17/17 1347 12/17/17 1413  BP: 129/79 139/84  Pulse: 71 75  Resp: 17 17  Temp: (!) 36 C   SpO2: 97% 99%    Last Pain:  Vitals:   12/17/17 1413  TempSrc:   PainSc: 2                  Martha Clan

## 2017-12-19 ENCOUNTER — Encounter: Payer: Self-pay | Admitting: General Surgery

## 2017-12-20 ENCOUNTER — Other Ambulatory Visit: Payer: Self-pay | Admitting: Internal Medicine

## 2017-12-20 ENCOUNTER — Telehealth: Payer: Self-pay | Admitting: Radiology

## 2017-12-20 DIAGNOSIS — E876 Hypokalemia: Secondary | ICD-10-CM

## 2017-12-20 NOTE — Progress Notes (Signed)
Order placed for f/u potassium.  

## 2017-12-20 NOTE — Telephone Encounter (Signed)
Order placed for f/u potassium.  

## 2017-12-20 NOTE — Telephone Encounter (Signed)
Pt coming in for labs tomorrow, please place future orders. Thank you.  

## 2017-12-21 ENCOUNTER — Telehealth: Payer: Self-pay | Admitting: *Deleted

## 2017-12-21 ENCOUNTER — Other Ambulatory Visit (INDEPENDENT_AMBULATORY_CARE_PROVIDER_SITE_OTHER): Payer: Medicare Other

## 2017-12-21 DIAGNOSIS — E876 Hypokalemia: Secondary | ICD-10-CM | POA: Diagnosis not present

## 2017-12-21 LAB — SURGICAL PATHOLOGY

## 2017-12-21 LAB — POTASSIUM: Potassium: 3.8 mEq/L (ref 3.5–5.1)

## 2017-12-21 NOTE — Telephone Encounter (Signed)
-----   Message from Robert Bellow, MD sent at 12/21/2017  2:14 PM EST ----- Please notify the patient the biopsy was benign, just badly inflammed tissue.  F/U as planned.  ----- Message ----- From: Interface, Lab [facility] [place] Sent: 12/21/2017  12:13 PM EST To: Robert Bellow, MD

## 2017-12-21 NOTE — Telephone Encounter (Signed)
Notified patient as instructed, patient agrees.  

## 2017-12-22 ENCOUNTER — Other Ambulatory Visit: Payer: Self-pay | Admitting: Internal Medicine

## 2017-12-22 DIAGNOSIS — E876 Hypokalemia: Secondary | ICD-10-CM

## 2017-12-22 NOTE — Progress Notes (Signed)
Order placed for f/u potassium.  

## 2017-12-23 ENCOUNTER — Other Ambulatory Visit: Payer: Self-pay

## 2017-12-23 ENCOUNTER — Encounter: Payer: Self-pay | Admitting: General Surgery

## 2017-12-23 ENCOUNTER — Ambulatory Visit (INDEPENDENT_AMBULATORY_CARE_PROVIDER_SITE_OTHER): Payer: Medicare Other | Admitting: General Surgery

## 2017-12-23 VITALS — BP 140/82 | HR 60 | Temp 97.5°F | Ht 66.0 in | Wt 133.0 lb

## 2017-12-23 DIAGNOSIS — N641 Fat necrosis of breast: Secondary | ICD-10-CM

## 2017-12-23 NOTE — Progress Notes (Signed)
Patient ID: [name], female   DOB: 1945/10/19, 72 y.o.   MRN: [id number]  Chief Complaint  Patient presents with  . Follow-up    HPI [name] is a 72 y.o. female.  Here for her postoperative visit, right breast biopsy on 12-17-17. She states she is sore but doing well.  HPI  Past Medical History:  Diagnosis Date  . Anemia    takes iron supplements  . Anxiety   . Arthritis   . Breast mass, right 12/2017  . Burning feet syndrome   . Chronic insomnia   . Complication of anesthesia    "AFFECTS MY MEMORY"  . COPD (chronic obstructive pulmonary disease) ([place])    documented in outside records. patient denies this diagnosis  . Depression   . Dysrhythmia    PALPITATIONS when anxious  . Edema    MILD ANKLE  . GERD (gastroesophageal reflux disease)   . Heart murmur    current small sign of heart murmur  . History of stomach ulcers   . HOH (hard of hearing)    AIDS but doesn't always wear them  . Hypercholesterolemia   . Hyperparathyroidism, primary ([place])   . Hypertension   . Neuropathy    arms, feet, legs, neck  . Osteoporosis   . Palpitations   . Scoliosis   . Shortness of breath    with exertion  . Tingling    both hands    Past Surgical History:  Procedure Laterality Date  . ANTERIOR CERVICAL DECOMP/DISCECTOMY FUSION  10/28/2011   Procedure: ANTERIOR CERVICAL DECOMPRESSION/DISCECTOMY FUSION 2 LEVELS;  Surgeon: Elaina Hoops, MD;  Location: [facility];  Service: Neurosurgery;  Laterality: N/A;  . BREAST BIOPSY Right 1992   neg  . BREAST BIOPSY Right 12/17/2017   Procedure: BREAST BIOPSY;  Surgeon: Robert Bellow, MD;  Location: [facility];  Service: General;  Laterality: Right;  . BREAST SURGERY     RT BREAST CYST REMOVED 15 YRS AGO   . CATARACT EXTRACTION W/PHACO Right 10/30/2014   Procedure: CATARACT EXTRACTION PHACO AND INTRAOCULAR LENS PLACEMENT (IOC);  Surgeon: Birder Robson, MD;  Location: [facility];  Service: Ophthalmology;  Laterality:  Right;  US0:40 AP18.2 CDE7.42  [facility] lot #[id number]  . CATARACT EXTRACTION W/PHACO Left 11/27/2014   Procedure: CATARACT EXTRACTION PHACO AND INTRAOCULAR LENS PLACEMENT (IOC);  Surgeon: Birder Robson, MD;  Location: [facility];  Service: Ophthalmology;  Laterality: Left;  [place] 00:36.9 AP%: 18.9 CDE: 6.96  [place] [facility] #  [id number]  . CYSTOCELE REPAIR N/A 05/13/2015   Procedure: ANTERIOR REPAIR (CYSTOCELE);  Surgeon: Brayton Mars, MD;  Location: [facility];  Service: Gynecology;  Laterality: N/A;  . CYSTOSCOPY  05/13/2015   Procedure: CYSTOSCOPY;  Surgeon: Brayton Mars, MD;  Location: [facility];  Service: Gynecology;;  . dental implant    . DILATION AND CURETTAGE OF UTERUS     X2   . EYE SURGERY Bilateral    cataract extractions  . JOINT REPLACEMENT     RT HIP  2013  (PARTIAL)   . LAPAROSCOPY  2005   bowel obstruction  . PARATHYROIDECTOMY Left 08/25/2012   Procedure: PARATHYROIDECTOMY;  Surgeon: Earnstine Regal, MD;  Location: [facility];  Service: General;  Laterality: Left;  . PITUITARY SURGERY     FOR TUMOR 3 YRS   . REFRACTIVE SURGERY    . TOTAL KNEE ARTHROPLASTY Right 07/18/2014   Procedure: TOTAL KNEE ARTHROPLASTY;  Surgeon: Dereck Leep, MD;  Location: [facility]  ORS;  Service: Orthopedics;  Laterality: Right;  Marland Kitchen VAGINAL HYSTERECTOMY Bilateral 05/13/2015   Procedure: HYSTERECTOMY VAGINAL WITH BILATERAL SALPINGO OOPHERECTOMY;  Surgeon: Brayton Mars, MD;  Location: [facility];  Service: Gynecology;  Laterality: Bilateral;  . VAGINAL HYSTERECTOMY     bso ant repair    Family History  Problem Relation Age of Onset  . Glaucoma Mother   . Hypertension Mother   . Thyroid disease Mother   . Stroke Father   . Hypertension Father   . Bipolar disorder Sister   . Breast cancer Paternal Aunt        60's  . Cancer Neg Hx   . Diabetes Neg Hx     Social History Social History   Tobacco Use  . Smoking status: Former Smoker    Packs/day: 1.00    Years: 35.00    Pack years:  35.00    Types: Cigarettes    Last attempt to quit: 02/10/2008    Years since quitting: 9.8  . Smokeless tobacco: Never Used  Substance Use Topics  . Alcohol use: Yes    Alcohol/week: 0.0 standard drinks    Comment: rare, like holidays  . Drug use: No    Allergies  Allergen Reactions  . Citalopram Hives  . Sulfamethoxazole-Trimethoprim Other (See Comments)    GI Upset  . Tape Itching    Paper tape is okay    Current Outpatient Medications  Medication Sig Dispense Refill  . acetaminophen (TYLENOL) 500 MG tablet Take 1,000 mg by mouth every 6 (six) hours as needed for moderate pain, fever or headache.     . Alpha-Lipoic Acid 600 MG CAPS Take 600 mg by mouth daily.     Marland Kitchen amLODipine (NORVASC) 5 MG tablet Take 1 tablet (5 mg total) by mouth daily. 90 tablet 1  . aspirin EC 81 MG tablet Take 81 mg by mouth 4 (four) times a week.     Marland Kitchen atorvastatin (LIPITOR) 40 MG tablet Take 1 tablet (40 mg total) by mouth daily. (Patient taking differently: Take 40 mg by mouth daily. Take in the evening) 90 tablet 1  . Biotin w/ Vitamins C & E (HAIR/SKIN/NAILS PO) Take 1 tablet by mouth daily.    Marland Kitchen buPROPion (WELLBUTRIN) 75 MG tablet Take 1 tablet (75 mg total) by mouth every morning. 30 tablet 1  . chlorthalidone (HYGROTON) 25 MG tablet TAKE ONE TABLET EVERY DAY (Patient taking differently: Take 25 mg by mouth daily. ) 90 tablet 1  . cholecalciferol (VITAMIN D) 1000 UNITS tablet Take 1,000 Units by mouth daily.     Marland Kitchen docusate sodium (COLACE) 100 MG capsule Take 100 mg by mouth daily.     . ferrous sulfate 325 (65 FE) MG tablet Take 325 mg by mouth daily.    Marland Kitchen gabapentin (NEURONTIN) 300 MG capsule Take 600 mg by mouth at bedtime.     Marland Kitchen losartan (COZAAR) 100 MG tablet TAKE ONE TABLET BY MOUTH EVERY DAY (Patient taking differently: Take 100 mg by mouth daily. ) 90 tablet 1  . magnesium oxide (MAG-OX) 400 MG tablet Take 400 mg by mouth daily.    Marland Kitchen MEGARED OMEGA-3 KRILL OIL PO Take 1 capsule by mouth at  bedtime.    . pantoprazole (PROTONIX) 40 MG tablet TAKE ONE TABLET BY MOUTH EVERY DAY (Patient taking differently: Take 40 mg by mouth daily. ) 60 tablet 3  . Polyethyl Glycol-Propyl Glycol (SYSTANE) 0.4-0.3 % SOLN Place 1 drop into both eyes 2 (two) times  daily as needed (for dry eyes).    . potassium chloride (K-DUR) 10 MEQ tablet Take 1 tablet (10 mEq total) by mouth daily. (Patient taking differently: Take 20 mEq by mouth daily. ) 90 tablet 1  . Probiotic Product (FLORAJEN3 PO) Take 1 capsule by mouth daily.     . ranitidine (ZANTAC) 150 MG tablet Take 75 mg by mouth daily.     Marland Kitchen senna-docusate (SENOKOT-S) 8.6-50 MG tablet Take 1 tablet by mouth at bedtime as needed for mild constipation.     No current facility-administered medications for this visit.     Review of Systems Review of Systems  Constitutional: Negative.   Respiratory: Negative.   Cardiovascular: Negative.     Blood pressure 140/82, pulse 60, temperature (!) 97.5 F (36.4 C), temperature source Skin, height 5\' 6"  (1.676 m), weight 133 lb (60.3 kg), SpO2 98 %.  Physical Exam Physical Exam  Constitutional: She is oriented to person, place, and time. She appears well-developed and well-nourished.  Pulmonary/Chest:  Right breast incision healing well    Neurological: She is alert and oriented to person, place, and time.  Skin: Skin is warm and dry.  Psychiatric: Her behavior is normal.    Data Reviewed December 17, 2017 biopsy results:   BREAST, RIGHT 9-11:00; LUMPECTOMY:  - EXTENSIVE RUPTURED DUCT ECTASIA WITH INFLAMMATION AND AREAS OF  SQUAMOUS METAPLASIA.  - FAT NECROSIS.  - SCLEROSING ADENOSIS WITH MICROCALCIFICATION.  - USUAL DUCTAL HYPERPLASIA.  - FIBROADENOMATOUS CHANGE.  - NEGATIVE FOR ATYPIA AND MALIGNANCY.  > 100 cc volume.  November 11, 2017 core biopsy Breast, right, needle core biopsy, 10 o'clock - ORGANIZING FAT NECROSIS. FIBROCYSTIC CHANGES, TO INCLUDE ECTATIC DUCTS, SOME OF WHICH  ARE RUPTURED WITH A PROMINENT GRANULOMATOUS RESPONSE.  Assessment    Doing well post large volume excisional biopsy    Plan    Reviewed with patient that there has been no history of trauma in over 10 years. Source for fat necrosis unclear.  Hypokalemia noted preop.  No indication of malignancy Continue potassium per Dr Einar Pheasant. The patient is aware to use a heating pad as needed for comfort. Follow up in one month.      HPI, Physical Exam, Assessment and Plan have been scribed under the direction and in the presence of Robert Bellow, MD. Karie Fetch, RN  I have completed the exam and reviewed the above documentation for accuracy and completeness.  I agree with the above.  Haematologist has been used and any errors in dictation or transcription are unintentional.  Hervey Ard, M.D., F.A.C.S.   [name] 12/23/2017, 1:56 PM

## 2017-12-23 NOTE — Patient Instructions (Addendum)
The patient is aware to call back for any questions or new concerns.  Continue potassium per Dr Einar Pheasant. The patient is aware to use a heating pad as needed for comfort.

## 2017-12-24 ENCOUNTER — Other Ambulatory Visit: Payer: Self-pay | Admitting: Psychiatry

## 2017-12-29 ENCOUNTER — Other Ambulatory Visit: Payer: Self-pay

## 2017-12-29 ENCOUNTER — Encounter: Payer: Self-pay | Admitting: Psychiatry

## 2017-12-29 ENCOUNTER — Ambulatory Visit (INDEPENDENT_AMBULATORY_CARE_PROVIDER_SITE_OTHER): Payer: No Typology Code available for payment source | Admitting: Psychiatry

## 2017-12-29 ENCOUNTER — Other Ambulatory Visit: Payer: Self-pay | Admitting: Neurology

## 2017-12-29 VITALS — BP 149/86 | HR 61 | Temp 97.7°F | Wt 131.6 lb

## 2017-12-29 DIAGNOSIS — F4310 Post-traumatic stress disorder, unspecified: Secondary | ICD-10-CM

## 2017-12-29 DIAGNOSIS — D352 Benign neoplasm of pituitary gland: Secondary | ICD-10-CM

## 2017-12-29 DIAGNOSIS — F411 Generalized anxiety disorder: Secondary | ICD-10-CM | POA: Diagnosis not present

## 2017-12-29 DIAGNOSIS — F331 Major depressive disorder, recurrent, moderate: Secondary | ICD-10-CM | POA: Diagnosis not present

## 2017-12-29 DIAGNOSIS — R001 Bradycardia, unspecified: Secondary | ICD-10-CM | POA: Diagnosis not present

## 2017-12-29 DIAGNOSIS — R42 Dizziness and giddiness: Secondary | ICD-10-CM | POA: Diagnosis not present

## 2017-12-29 DIAGNOSIS — G608 Other hereditary and idiopathic neuropathies: Secondary | ICD-10-CM | POA: Diagnosis not present

## 2017-12-29 MED ORDER — HYDROXYZINE HCL 10 MG PO TABS: 10.0000 mg | ORAL_TABLET | Freq: Every day | ORAL | 1 refills | Status: DC | PRN

## 2017-12-29 MED ORDER — BUPROPION HCL 100 MG PO TABS: 100.0000 mg | ORAL_TABLET | Freq: Every morning | ORAL | 1 refills | Status: DC
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
## 2017-12-31 ENCOUNTER — Other Ambulatory Visit (INDEPENDENT_AMBULATORY_CARE_PROVIDER_SITE_OTHER): Payer: Medicare Other

## 2017-12-31 ENCOUNTER — Telehealth: Payer: Self-pay | Admitting: Podiatry

## 2017-12-31 DIAGNOSIS — R001 Bradycardia, unspecified: Secondary | ICD-10-CM | POA: Diagnosis not present

## 2017-12-31 DIAGNOSIS — E876 Hypokalemia: Secondary | ICD-10-CM | POA: Diagnosis not present

## 2017-12-31 DIAGNOSIS — R42 Dizziness and giddiness: Secondary | ICD-10-CM | POA: Diagnosis not present

## 2017-12-31 DIAGNOSIS — Z23 Encounter for immunization: Secondary | ICD-10-CM | POA: Diagnosis not present

## 2017-12-31 DIAGNOSIS — D352 Benign neoplasm of pituitary gland: Secondary | ICD-10-CM | POA: Diagnosis not present

## 2017-12-31 DIAGNOSIS — G608 Other hereditary and idiopathic neuropathies: Secondary | ICD-10-CM | POA: Diagnosis not present

## 2017-12-31 LAB — POTASSIUM: POTASSIUM: 3.4 meq/L — AB (ref 3.5–5.1)

## 2017-12-31 NOTE — Telephone Encounter (Signed)
Pt left message stating she got a boot several months ago and has not been able to wear it. It causing her back pain because she is off balance. She said she may need a new pair of shoes but is not sure what she is needing. Please advise

## 2018-01-04 ENCOUNTER — Other Ambulatory Visit: Payer: Self-pay | Admitting: Internal Medicine

## 2018-01-04 DIAGNOSIS — E876 Hypokalemia: Secondary | ICD-10-CM

## 2018-01-04 NOTE — Progress Notes (Signed)
Order placed for f/u potassium.  

## 2018-01-12 DIAGNOSIS — H43813 Vitreous degeneration, bilateral: Secondary | ICD-10-CM | POA: Diagnosis not present

## 2018-01-17 ENCOUNTER — Other Ambulatory Visit: Payer: Self-pay | Admitting: Internal Medicine

## 2018-01-17 ENCOUNTER — Telehealth: Payer: Self-pay | Admitting: Internal Medicine

## 2018-01-17 NOTE — Telephone Encounter (Signed)
Copied from [facility] [id number]. Topic: Quick Communication - Rx Refill/Question >> Jan 17, 2018 10:30 AM Blase Mess A wrote: Medication: potassium chloride (K-DUR) 10 MEQ tablet [[id number]]  Patient states that she is out of her potassium pills after today.  Has the patient contacted their pharmacy? Yes  (Agent: If no, request that the patient contact the pharmacy for the refill.) (Agent: If yes, when and what did the pharmacy advise?)  Preferred Pharmacy (with phone number or street name): [place], [place] - [address] [place] [address] Phone: [phone] Fax: [phone]    Agent: Please be advised that RX refills may take up to 3 business days. We ask that you follow-up with your pharmacy.

## 2018-01-17 NOTE — Telephone Encounter (Signed)
I have sent for patient. FYI.

## 2018-01-18 ENCOUNTER — Ambulatory Visit
Admission: RE | Admit: 2018-01-18 | Discharge: 2018-01-18 | Disposition: A | Discharge status: Home / Self Care | Payer: Medicare Other | Source: Ambulatory Visit | Attending: Neurology | Admitting: Neurology

## 2018-01-18 DIAGNOSIS — D352 Benign neoplasm of pituitary gland: Secondary | ICD-10-CM | POA: Insufficient documentation

## 2018-01-18 MED ORDER — GADOBUTROL 1 MMOL/ML IV SOLN
6.0000 mL | Freq: Once | INTRAVENOUS | Status: AC | PRN
  Administered 2018-01-18: 6 mL via INTRAVENOUS

## 2018-01-19 ENCOUNTER — Other Ambulatory Visit (INDEPENDENT_AMBULATORY_CARE_PROVIDER_SITE_OTHER): Payer: Medicare Other

## 2018-01-19 ENCOUNTER — Other Ambulatory Visit: Payer: Self-pay

## 2018-01-19 ENCOUNTER — Other Ambulatory Visit: Payer: Medicare Other | Admitting: Orthotics

## 2018-01-19 DIAGNOSIS — E876 Hypokalemia: Secondary | ICD-10-CM | POA: Diagnosis not present

## 2018-01-19 MED ORDER — POTASSIUM CHLORIDE CRYS ER 10 MEQ PO TBCR: 10.0000 meq | EXTENDED_RELEASE_TABLET | Freq: Two times a day (BID) | ORAL | 0 refills | Status: DC

## 2018-01-19 NOTE — Addendum Note (Signed)
Addended by: Arby Barrette on: 01/19/2018 03:07 PM   Modules accepted: Orders

## 2018-01-19 NOTE — Telephone Encounter (Signed)
[facility] calling and states the pt states she is to take the potassium 2 times a day instead of once. They state if the directions have changed, they need a new rx sent over. Please advise.

## 2018-01-19 NOTE — Telephone Encounter (Signed)
Please advise 

## 2018-01-19 NOTE — Progress Notes (Addendum)
Ordered MBB shoe to accomdate her az brace.   No charge.  Patient came in today to pick up standard Afo brace.  Patient was evaluated for fit and function.   The brace fit very well and there were any complaints of the way it felt once donned.  The brace offered ankle stability in both saggital and coroneal planes.  Patient advised to always wear proper fitting shoes with brace.

## 2018-01-19 NOTE — Telephone Encounter (Signed)
Rx corrected. [facility] is aware and is going to call pt when ready

## 2018-01-19 NOTE — Telephone Encounter (Signed)
[facility] is needing an updated Rx on Potassium, to reflect increased dose.  Per recent result note:   Notes recorded by Einar Pheasant, MD on 01/04/2018 at 5:24 AM EST Please call and notify pt that her potassium is slightly decreased. (relatively stable from checks prior). Confirm she is on kcl 17meq (two tablets per day). We will continue to follow. Recheck potassium in 2 weeks.

## 2018-01-20 LAB — POTASSIUM: Potassium: 3.7 mmol/L (ref 3.5–5.3)

## 2018-01-21 ENCOUNTER — Other Ambulatory Visit: Payer: Self-pay | Admitting: Internal Medicine

## 2018-01-25 ENCOUNTER — Other Ambulatory Visit: Payer: Self-pay

## 2018-01-25 ENCOUNTER — Ambulatory Visit (INDEPENDENT_AMBULATORY_CARE_PROVIDER_SITE_OTHER): Payer: Medicare Other | Admitting: General Surgery

## 2018-01-25 ENCOUNTER — Encounter: Payer: Self-pay | Admitting: General Surgery

## 2018-01-25 VITALS — BP 153/91 | HR 58 | Resp 14 | Ht 66.0 in | Wt 133.0 lb

## 2018-01-25 DIAGNOSIS — N6310 Unspecified lump in the right breast, unspecified quadrant: Secondary | ICD-10-CM

## 2018-01-25 NOTE — Addendum Note (Signed)
Addended by: Velora Heckler on: 01/25/2018 11:23 PM   Modules accepted: Level of Service

## 2018-01-25 NOTE — Patient Instructions (Signed)
The patient is aware to call back for any questions or new concerns.  

## 2018-01-25 NOTE — Progress Notes (Signed)
Patient ID: [name], female   DOB: 09-06-1945, 72 y.o.   MRN: [id number]  Chief Complaint  Patient presents with  . Follow-up    HPI [name] is a 72 y.o. female.  Here for follow up right breast biopsy 12-17-17. She states the area is still "sore" but no pain.  HPI  Past Medical History:  Diagnosis Date  . Anemia    takes iron supplements  . Anxiety   . Arthritis   . Breast mass, right 12/2017  . Burning feet syndrome   . Chronic insomnia   . Complication of anesthesia    "AFFECTS MY MEMORY"  . COPD (chronic obstructive pulmonary disease) ([place])    documented in outside records. patient denies this diagnosis  . Depression   . Dysrhythmia    PALPITATIONS when anxious  . Edema    MILD ANKLE  . GERD (gastroesophageal reflux disease)   . Heart murmur    current small sign of heart murmur  . History of stomach ulcers   . HOH (hard of hearing)    AIDS but doesn't always wear them  . Hypercholesterolemia   . Hyperparathyroidism, primary ([employer])   . Hypertension   . Neuropathy    arms, feet, legs, neck  . Osteoporosis   . Palpitations   . Scoliosis   . Shortness of breath    with exertion  . Tingling    both hands    Past Surgical History:  Procedure Laterality Date  . ANTERIOR CERVICAL DECOMP/DISCECTOMY FUSION  10/28/2011   Procedure: ANTERIOR CERVICAL DECOMPRESSION/DISCECTOMY FUSION 2 LEVELS;  Surgeon: Elaina Hoops, MD;  Location: [place] [facility];  Service: Neurosurgery;  Laterality: N/A;  . BREAST BIOPSY Right 1992   neg  . BREAST BIOPSY Right 12/17/2017   Procedure: BREAST BIOPSY;  Surgeon: Robert Bellow, MD;  Location: [facility] ORS;  Service: General;  Laterality: Right;  . BREAST SURGERY     RT BREAST CYST REMOVED 15 YRS AGO   . CATARACT EXTRACTION W/PHACO Right 10/30/2014   Procedure: CATARACT EXTRACTION PHACO AND INTRAOCULAR LENS PLACEMENT (IOC);  Surgeon: Birder Robson, MD;  Location: [facility] ORS;  Service: Ophthalmology;  Laterality: Right;   US0:40 AP18.2 CDE7.42  casette lot #[id number]  . CATARACT EXTRACTION W/PHACO Left 11/27/2014   Procedure: CATARACT EXTRACTION PHACO AND INTRAOCULAR LENS PLACEMENT (IOC);  Surgeon: Birder Robson, MD;  Location: [facility] ORS;  Service: Ophthalmology;  Laterality: Left;  [place] 00:36.9 AP%: 18.9 CDE: 6.96  casette lot #  [id number]  . CYSTOCELE REPAIR N/A 05/13/2015   Procedure: ANTERIOR REPAIR (CYSTOCELE);  Surgeon: Brayton Mars, MD;  Location: [facility] ORS;  Service: Gynecology;  Laterality: N/A;  . CYSTOSCOPY  05/13/2015   Procedure: CYSTOSCOPY;  Surgeon: Brayton Mars, MD;  Location: [facility] ORS;  Service: Gynecology;;  . dental implant    . DILATION AND CURETTAGE OF UTERUS     X2   . EYE SURGERY Bilateral    cataract extractions  . JOINT REPLACEMENT     RT HIP  2013  (PARTIAL)   . LAPAROSCOPY  2005   bowel obstruction  . PARATHYROIDECTOMY Left 08/25/2012   Procedure: PARATHYROIDECTOMY;  Surgeon: Earnstine Regal, MD;  Location: [facility];  Service: General;  Laterality: Left;  . PITUITARY SURGERY     FOR TUMOR 3 YRS   . REFRACTIVE SURGERY    . TOTAL KNEE ARTHROPLASTY Right 07/18/2014   Procedure: TOTAL KNEE ARTHROPLASTY;  Surgeon: Dereck Leep, MD;  Location[facility];  Service: Orthopedics;  Laterality: Right;  Marland Kitchen VAGINAL HYSTERECTOMY Bilateral 05/13/2015   Procedure: HYSTERECTOMY VAGINAL WITH BILATERAL SALPINGO OOPHERECTOMY;  Surgeon: Brayton Mars, MD;  Location: [facility] ORS;  Service: Gynecology;  Laterality: Bilateral;  . VAGINAL HYSTERECTOMY     bso ant repair    Family History  Problem Relation Age of Onset  . Glaucoma Mother   . Hypertension Mother   . Thyroid disease Mother   . Stroke Father   . Hypertension Father   . Bipolar disorder Sister   . Breast cancer Paternal Aunt        60's  . Cancer Neg Hx   . Diabetes Neg Hx     Social History Social History   Tobacco Use  . Smoking status: Former Smoker    Packs/day: 1.00    Years: 35.00    Pack years: 35.00     Types: Cigarettes    Last attempt to quit: 02/10/2008    Years since quitting: 9.9  . Smokeless tobacco: Never Used  Substance Use Topics  . Alcohol use: Yes    Alcohol/week: 0.0 standard drinks    Comment: rare, like holidays  . Drug use: No    Allergies  Allergen Reactions  . Citalopram Hives  . Sulfamethoxazole-Trimethoprim Other (See Comments)    GI Upset  . Tape Itching    Paper tape is okay    Current Outpatient Medications  Medication Sig Dispense Refill  . acetaminophen (TYLENOL) 500 MG tablet Take 1,000 mg by mouth every 6 (six) hours as needed for moderate pain, fever or headache.     . Alpha-Lipoic Acid 600 MG CAPS Take 600 mg by mouth daily.     Marland Kitchen amLODipine (NORVASC) 5 MG tablet Take 1 tablet (5 mg total) by mouth daily. 90 tablet 1  . aspirin EC 81 MG tablet Take 81 mg by mouth 4 (four) times a week.     Marland Kitchen atorvastatin (LIPITOR) 40 MG tablet Take 1 tablet (40 mg total) by mouth daily. (Patient taking differently: Take 40 mg by mouth daily. Take in the evening) 90 tablet 1  . Biotin w/ Vitamins C & E (HAIR/SKIN/NAILS PO) Take 1 tablet by mouth daily.    Marland Kitchen buPROPion (WELLBUTRIN) 100 MG tablet Take 1 tablet (100 mg total) by mouth every morning. 30 tablet 1  . chlorthalidone (HYGROTON) 25 MG tablet TAKE ONE TABLET EVERY DAY (Patient taking differently: Take 25 mg by mouth daily. ) 90 tablet 1  . cholecalciferol (VITAMIN D) 1000 UNITS tablet Take 1,000 Units by mouth daily.     Marland Kitchen docusate sodium (COLACE) 100 MG capsule Take 100 mg by mouth daily.     . ferrous sulfate 325 (65 FE) MG tablet Take 325 mg by mouth daily.    Marland Kitchen gabapentin (NEURONTIN) 300 MG capsule Take 600 mg by mouth at bedtime.     . hydrOXYzine (ATARAX/VISTARIL) 10 MG tablet Take 1-2 tablets (10-20 mg total) by mouth daily as needed for anxiety (FOR ANXIETY). 60 tablet 1  . losartan (COZAAR) 100 MG tablet TAKE ONE TABLET BY MOUTH EVERY DAY (Patient taking differently: Take 100 mg by mouth daily. ) 90  tablet 1  . magnesium oxide (MAG-OX) 400 MG tablet Take 400 mg by mouth daily.    Marland Kitchen MEGARED OMEGA-3 KRILL OIL PO Take 1 capsule by mouth at bedtime.    . pantoprazole (PROTONIX) 40 MG tablet TAKE ONE TABLET EVERY DAY 60 tablet 3  . Polyethyl Glycol-Propyl Glycol (  SYSTANE) 0.4-0.3 % SOLN Place 1 drop into both eyes 2 (two) times daily as needed (for dry eyes).    . potassium chloride (K-DUR,KLOR-CON) 10 MEQ tablet Take 1 tablet (10 mEq total) by mouth 2 (two) times daily. 180 tablet 0  . Probiotic Product (FLORAJEN3 PO) Take 1 capsule by mouth daily.     . ranitidine (ZANTAC) 150 MG tablet Take 75 mg by mouth daily.     Marland Kitchen senna-docusate (SENOKOT-S) 8.6-50 MG tablet Take 1 tablet by mouth at bedtime as needed for mild constipation.     No current facility-administered medications for this visit.     Review of Systems Review of Systems  Constitutional: Negative.   Respiratory: Negative.   Cardiovascular: Negative.     Blood pressure (!) 153/91, pulse (!) 58, resp. rate 14, height 5\' 6"  (1.676 m), weight 133 lb (60.3 kg), SpO2 97 %.  Physical Exam Physical Exam Exam conducted with a chaperone present.  Constitutional:      Appearance: Normal appearance.  Chest:       Comments: Right breast incision well healed with thickening. Skin:    General: Skin is warm and dry.  Neurological:     Mental Status: She is alert and oriented to person, place, and time.  Psychiatric:        Mood and Affect: Mood normal.     Data Reviewed  A. BREAST, RIGHT 9-11:00; LUMPECTOMY:  - EXTENSIVE RUPTURED DUCT ECTASIA WITH INFLAMMATION AND AREAS OF  SQUAMOUS METAPLASIA.  - FAT NECROSIS.  - SCLEROSING ADENOSIS WITH MICROCALCIFICATION.  - USUAL DUCTAL HYPERPLASIA.  - FIBROADENOMATOUS CHANGE.  - NEGATIVE FOR ATYPIA AND MALIGNANCY. December 17, 2017 biopsy:  Assessment    Anticipated postsurgical changes.    Plan    Follow up in 4 months The patient is aware to call back for any questions or  new concerns.      HPI, Physical Exam, Assessment and Plan have been scribed under the direction and in the presence of Robert Bellow, MD. Karie Fetch, RN  I have completed the exam and reviewed the above documentation for accuracy and completeness.  I agree with the above.  Haematologist has been used and any errors in dictation or transcription are unintentional.  Hervey Ard, M.D., F.A.C.S.  Forest Gleason Kynslee Baham 01/26/2018, 6:36 AM

## 2018-01-26 ENCOUNTER — Ambulatory Visit: Payer: Medicare Other | Admitting: Psychiatry

## 2018-02-08 ENCOUNTER — Ambulatory Visit (INDEPENDENT_AMBULATORY_CARE_PROVIDER_SITE_OTHER): Payer: Medicare Other

## 2018-02-08 ENCOUNTER — Ambulatory Visit: Payer: Medicare Other

## 2018-02-08 VITALS — BP 130/84 | HR 60 | Temp 97.6°F | Resp 15 | Ht 66.0 in | Wt 132.0 lb

## 2018-02-08 DIAGNOSIS — Z0000 Encounter for general adult medical examination without abnormal findings: Secondary | ICD-10-CM

## 2018-02-08 NOTE — Progress Notes (Signed)
Subjective:   [name] is a 72 y.o. female who presents for Medicare Annual (Subsequent) preventive examination.  Review of Systems:  No ROS.  Medicare Wellness Visit. Additional risk factors are reflected in the social history. Cardiac Risk Factors include: advanced age (>94men, >12 women);hypertension     Objective:     Vitals: BP 130/84 (BP Location: Left Arm, Patient Position: Sitting, Cuff Size: Normal)   Pulse 60   Temp 97.6 F (36.4 C) (Oral)   Resp 15   Ht 5\' 6"  (1.676 m)   Wt 132 lb (59.9 kg)   SpO2 96%   BMI 21.31 kg/m   Body mass index is 21.31 kg/m.  Advanced Directives 02/08/2018 12/17/2017 12/14/2017 02/05/2017 01/29/2016 05/13/2015 05/08/2015  Does Patient Have a Medical Advance Directive? Yes Yes Yes No Yes No Yes  Type of Paramedic of [place];Living will [place];Living will Living will;Healthcare Power of [place];Living will - [place]  Does patient want to make changes to medical advance directive? No - Patient declined No - Patient declined No - Patient declined - No - Patient declined - -  Copy of [place] in Chart? No - copy requested No - copy requested No - copy requested - No - copy requested - -  Would patient like information on creating a medical advance directive? - - - Yes (MAU/Ambulatory/Procedural Areas - Information given) - - -    Tobacco Social History   Tobacco Use  Smoking Status Former Smoker  . Packs/day: 1.00  . Years: 35.00  . Pack years: 35.00  . Types: Cigarettes  . Last attempt to quit: 02/10/2008  . Years since quitting: 10.0  Smokeless Tobacco Never Used     Counseling given: Not Answered   Clinical Intake:  Pre-visit preparation completed: Yes  Pain : No/denies pain     Diabetes: No  How often do you need to have someone help you when you read instructions, pamphlets, or other written  materials from your doctor or pharmacy?: 1 - Never  Interpreter Needed?: No     Past Medical History:  Diagnosis Date  . Anemia    takes iron supplements  . Anxiety   . Arthritis   . Breast mass, right 12/2017  . Burning feet syndrome   . Chronic insomnia   . Complication of anesthesia    "AFFECTS MY MEMORY"  . COPD (chronic obstructive pulmonary disease) ([place])    documented in outside records. patient denies this diagnosis  . Depression   . Dysrhythmia    PALPITATIONS when anxious  . Edema    MILD ANKLE  . GERD (gastroesophageal reflux disease)   . Heart murmur    current small sign of heart murmur  . History of stomach ulcers   . HOH (hard of hearing)    AIDS but doesn't always wear them  . Hypercholesterolemia   . Hyperparathyroidism, primary ([place])   . Hypertension   . Neuropathy    arms, feet, legs, neck  . Osteoporosis   . Palpitations   . Scoliosis   . Shortness of breath    with exertion  . Tingling    both hands   Past Surgical History:  Procedure Laterality Date  . ANTERIOR CERVICAL DECOMP/DISCECTOMY FUSION  10/28/2011   Procedure: ANTERIOR CERVICAL DECOMPRESSION/DISCECTOMY FUSION 2 LEVELS;  Surgeon: Elaina Hoops, MD;  Location: [facility];  Service: Neurosurgery;  Laterality: N/A;  .  BREAST BIOPSY Right 1992   neg  . BREAST BIOPSY Right 12/17/2017   Procedure: BREAST BIOPSY;  Surgeon: Robert Bellow, MD;  Location: [facility];  Service: General;  Laterality: Right;  . BREAST SURGERY     RT BREAST CYST REMOVED 15 YRS AGO   . CATARACT EXTRACTION W/PHACO Right 10/30/2014   Procedure: CATARACT EXTRACTION PHACO AND INTRAOCULAR LENS PLACEMENT (IOC);  Surgeon: Birder Robson, MD;  Location: [facility];  Service: Ophthalmology;  Laterality: Right;  US0:40 AP18.2 CDE7.42  casette lot #[id number]  . CATARACT EXTRACTION W/PHACO Left 11/27/2014   Procedure: CATARACT EXTRACTION PHACO AND INTRAOCULAR LENS PLACEMENT (IOC);  Surgeon: Birder Robson, MD;   Location: [facility];  Service: Ophthalmology;  Laterality: Left;  [place] 00:36.9 AP%: 18.9 CDE: 6.96  cas[place] lot #  [id number]  . CYSTOCELE REPAIR N/A 05/13/2015   Procedure: ANTERIOR REPAIR (CYSTOCELE);  Surgeon: Brayton Mars, MD;  Location: [facility];  Service: Gynecology;  Laterality: N/A;  . CYSTOSCOPY  05/13/2015   Procedure: CYSTOSCOPY;  Surgeon: Brayton Mars, MD;  Location: [facility];  Service: Gynecology;;  . dental implant    . DILATION AND CURETTAGE OF UTERUS     X2   . EYE SURGERY Bilateral    cataract extractions  . JOINT REPLACEMENT     RT HIP  2013  (PARTIAL)   . LAPAROSCOPY  2005   bowel obstruction  . PARATHYROIDECTOMY Left 08/25/2012   Procedure: PARATHYROIDECTOMY;  Surgeon: Earnstine Regal, MD;  Location: [facility];  Service: General;  Laterality: Left;  . PITUITARY SURGERY     FOR TUMOR 3 YRS   . REFRACTIVE SURGERY    . TOTAL KNEE ARTHROPLASTY Right 07/18/2014   Procedure: TOTAL KNEE ARTHROPLASTY;  Surgeon: Dereck Leep, MD;  Location: [facility];  Service: Orthopedics;  Laterality: Right;  Marland Kitchen VAGINAL HYSTERECTOMY Bilateral 05/13/2015   Procedure: HYSTERECTOMY VAGINAL WITH BILATERAL SALPINGO OOPHERECTOMY;  Surgeon: Brayton Mars, MD;  Location: [facility];  Service: Gynecology;  Laterality: Bilateral;  . VAGINAL HYSTERECTOMY     bso ant repair   Family History  Problem Relation Age of Onset  . Glaucoma Mother   . Hypertension Mother   . Thyroid disease Mother   . Stroke Father   . Hypertension Father   . Bipolar disorder Sister   . Breast cancer Paternal Aunt        60's  . Cancer Neg Hx   . Diabetes Neg Hx    Social History   Socioeconomic History  . Marital status: Divorced    Spouse name: Not on file  . Number of children: 1  . Years of education: Not on file  . Highest education level: Some college, no degree  Occupational History  . 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    Employer: [employer]    Comment: retired    Scientific laboratory technician  . Financial resource strain: Hard  . Food insecurity:    Worry: Often true    Inability: Often true  . Transportation needs:    Medical: No    Non-medical: No  Tobacco Use  . Smoking status: Former Smoker    Packs/day: 1.00    Years: 35.00    Pack years: 35.00    Types: Cigarettes    Last attempt to quit: 02/10/2008    Years since quitting: 10.0  . Smokeless tobacco: Never Used  Substance and Sexual Activity  . Alcohol use: Yes    Alcohol/week: 0.0 standard drinks  Comment: rare, like holidays  . Drug use: No  . Sexual activity: Never    Birth control/protection: Post-menopausal  Lifestyle  . Physical activity:    Days per week: 0 days    Minutes per session: 0 min  . Stress: Rather much  Relationships  . Social connections:    Talks on phone: Not on file    Gets together: Not on file    Attends religious service: Never    Active member of club or organization: No    Attends meetings of clubs or organizations: Never    Relationship status: Divorced  Other Topics Concern  . Not on file  Social History Narrative  . Not on file    Outpatient Encounter Medications as of 02/08/2018  Medication Sig  . acetaminophen (TYLENOL) 500 MG tablet Take 1,000 mg by mouth every 6 (six) hours as needed for moderate pain, fever or headache.   . Alpha-Lipoic Acid 600 MG CAPS Take 600 mg by mouth daily.   Marland Kitchen amLODipine (NORVASC) 5 MG tablet Take 1 tablet (5 mg total) by mouth daily.  Marland Kitchen aspirin EC 81 MG tablet Take 81 mg by mouth 4 (four) times a week.   Marland Kitchen atorvastatin (LIPITOR) 40 MG tablet Take 1 tablet (40 mg total) by mouth daily. (Patient taking differently: Take 40 mg by mouth daily. Take in the evening)  . Biotin w/ Vitamins C & E (HAIR/SKIN/NAILS PO) Take 1 tablet by mouth daily.  Marland Kitchen buPROPion (WELLBUTRIN) 100 MG tablet Take 1 tablet (100 mg total) by mouth every morning.  . chlorthalidone (HYGROTON) 25 MG tablet TAKE ONE TABLET EVERY DAY (Patient taking  differently: Take 25 mg by mouth daily. )  . cholecalciferol (VITAMIN D) 1000 UNITS tablet Take 1,000 Units by mouth daily.   Marland Kitchen docusate sodium (COLACE) 100 MG capsule Take 100 mg by mouth daily.   . ferrous sulfate 325 (65 FE) MG tablet Take 325 mg by mouth daily.  Marland Kitchen gabapentin (NEURONTIN) 300 MG capsule Take 600 mg by mouth at bedtime.   Marland Kitchen losartan (COZAAR) 100 MG tablet TAKE ONE TABLET BY MOUTH EVERY DAY (Patient taking differently: Take 100 mg by mouth daily. )  . magnesium oxide (MAG-OX) 400 MG tablet Take 400 mg by mouth daily.  Marland Kitchen MEGARED OMEGA-3 KRILL OIL PO Take 1 capsule by mouth at bedtime.  . pantoprazole (PROTONIX) 40 MG tablet TAKE ONE TABLET EVERY DAY  . Polyethyl Glycol-Propyl Glycol (SYSTANE) 0.4-0.3 % SOLN Place 1 drop into both eyes 2 (two) times daily as needed (for dry eyes).  . potassium chloride (K-DUR,KLOR-CON) 10 MEQ tablet Take 1 tablet (10 mEq total) by mouth 2 (two) times daily.  . Probiotic Product (FLORAJEN3 PO) Take 1 capsule by mouth daily.   . ranitidine (ZANTAC) 150 MG tablet Take 75 mg by mouth daily.   Marland Kitchen senna-docusate (SENOKOT-S) 8.6-50 MG tablet Take 1 tablet by mouth at bedtime as needed for mild constipation.  . hydrOXYzine (ATARAX/VISTARIL) 10 MG tablet Take 1-2 tablets (10-20 mg total) by mouth daily as needed for anxiety (FOR ANXIETY). (Patient not taking: Reported on 02/08/2018)   No facility-administered encounter medications on file as of 02/08/2018.     Activities of Daily Living In your present state of health, do you have any difficulty performing the following activities: 02/08/2018 12/14/2017  Hearing? Y Y  Comment Hearing aids heard of hearing. has hearing aides  Vision? N -  Difficulty concentrating or making decisions? Y -  Comment Notes she sometimes  has difficulty focusing. -  Walking or climbing stairs? N N  Dressing or bathing? N -  Doing errands, shopping? N N  Preparing Food and eating ? N -  Using the Toilet? N -  In the past  six months, have you accidently leaked urine? Y -  Comment Managed with daily liner -  Do you have problems with loss of bowel control? N -  Managing your Medications? N -  Managing your Finances? N -  Housekeeping or managing your Housekeeping? N -  Comment Son assists with vacuum. -  Some recent data might be hidden    Patient Care Team: Einar Pheasant, MD as PCP - General (Internal Medicine) Marry Guan, Laurice Record, MD (Orthopedic Surgery)    Assessment:   This is a routine wellness examination for [name].  Health Screenings  Mammogram- due 09/18/2019 Colonoscopy- due 09/16/2023 Bone Density-07/17/14 Hearing-hearing aids TSH-11/22/17 (1.09) Cholesterol-11/22/17 (167)  Social  Alcohol intake-rare Smoking history- former Smokers in home?none Illicit drug use?none Diplomatic Services operational officer  Patient feels safe at home.  Patient does have smoke detectors at home. Patient does wear sunscreen or protective clothing when in direct sunlight. Patient does wear seat belt when driving or riding with others.   Activities of Daily Living Patient can do their own household chores. Her son helps from time to time.  Denies needing assistance with: driving, feeding themselves, getting from bed to chair, getting to the toilet, bathing/showering, dressing, managing money, climbing flight of stairs, or preparing meals.   Depression Screen Notes she is currently in counseling with therapist and psychiatrist. Medication wellbutrin is helping; holding vistaril.  States vistaril is a little too much and plans to discuss with her therarpist. Reports visits with therapist Miguel Dibble and psychiatrist Ursula Alert, MD  Fall Screen Patient denies being afraid of falling or falling in the last year.   Memory Screen Patient denies problems with memory, misplacing items, and is able to balance checkbook/bank accounts.  Patient is alert, normal appearance, oriented to  person/place/and time. Correctly identified the president of the [place], recall of 3/3 objects, and performing simple calculations. Patient displays appropriate judgement and can read correct time from watch face.   Immunizations The following Immunizations are up to date: Influenza, pneumonia, and tetanus. Shingles discussed.   Other Providers Patient Care Team: Einar Pheasant, MD as PCP - General (Internal Medicine) Marry Guan Laurice Record, MD (Orthopedic Surgery)  Exercise Activities and Dietary recommendations Current Exercise Habits: Home exercise routine, Type of exercise: walking, Frequency (Times/Week): 5, Intensity: Mild  Goals      Patient Stated   . Healthy Lifestyle (pt-stated)     Keep all routine scheduled appointments Follow up with Therapist as needed Take all medications as directed       Fall Risk Fall Risk  02/08/2018 01/25/2018 01/25/2018 12/23/2017 02/05/2017  Falls in the past year? 0 0 0 0 No  Number falls in past yr: - - - - -  Injury with Fall? - - - - -  Risk for fall due to : - - - - -  Follow up - Falls evaluation completed - Falls evaluation completed -   Depression Screen PHQ 2/9 Scores 02/08/2018 02/05/2017 01/29/2016 10/11/2015  PHQ - 2 Score - 1 2 3   PHQ- 9 Score - 3 6 14   Exception Documentation Other- indicate reason in comment box - - -     Cognitive Function MMSE - Mini Mental State Exam 06/18/2014  Orientation to time  5  Orientation to Place 5  Registration 3  Attention/ Calculation 5  Recall 3  Language- name 2 objects 2  Language- repeat 1  Language- follow 3 step command 3  Language- read & follow direction 1  Write a sentence 1  Copy design 1  Total score 30     6CIT Screen 02/08/2018 02/05/2017 01/29/2016  What Year? 0 points 0 points 0 points  What month? 0 points 0 points 0 points  What time? 0 points 0 points 0 points  Count back from 20 0 points 0 points 0 points  Months in reverse 0 points 0 points 0 points  Repeat phrase  0 points 0 points -  Total Score 0 0 -    Immunization History  Administered Date(s) Administered  . Influenza Split 02/12/2012  . Influenza, High Dose Seasonal PF 10/11/2015, 10/16/2016, 12/31/2017  . Influenza,inj,Quad PF,6+ Mos 10/24/2012, 11/28/2013, 12/17/2014  . Pneumococcal Conjugate-13 05/17/2014  . Pneumococcal Polysaccharide-23 02/05/2017  . Tdap 05/28/2011   Screening Tests Health Maintenance  Topic Date Due  . MAMMOGRAM  09/18/2019  . TETANUS/TDAP  05/27/2021  . COLONOSCOPY  09/16/2023  . INFLUENZA VACCINE  Completed  . DEXA SCAN  Completed  . Hepatitis C Screening  Completed  . PNA vac Low Risk Adult  Completed      Plan:   End of life planning; Advance aging; Advanced directives discussed. Copy of current HCPOA/Living Will requested.    I have personally reviewed and noted the following in the patient's chart:   . Medical and social history . Use of alcohol, tobacco or illicit drugs  . Current medications and supplements . Functional ability and status . Nutritional status . Physical activity . Advanced directives . List of other physicians . Hospitalizations, surgeries, and ER visits in previous 12 months . Vitals . Screenings to include cognitive, depression, and falls . Referrals and appointments  In addition, I have reviewed and discussed with patient certain preventive protocols, quality metrics, and best practice recommendations. A written personalized care plan for preventive services as well as general preventive health recommendations were provided to patient.     Varney Biles, LPN  67/34/1935   Reviewed above information.  Agree with assessment and plan.    Dr Nicki Reaper

## 2018-02-08 NOTE — Patient Instructions (Addendum)
  [name] , Thank you for taking time to come for your Medicare Wellness Visit. I appreciate your ongoing commitment to your health goals. Please review the following plan we discussed and let me know if I can assist you in the future.   These are the goals we discussed: Goals      Patient Stated   . Healthy Lifestyle (pt-stated)     Keep all routine scheduled appointments Follow up with Therapist as needed Take all medications as directed       This is a list of the screening recommended for you and due dates:  Health Maintenance  Topic Date Due  . Mammogram  09/18/2019  . Tetanus Vaccine  05/27/2021  . Colon Cancer Screening  09/16/2023  . Flu Shot  Completed  . DEXA scan (bone density measurement)  Completed  .  Hepatitis C: One time screening is recommended by Center for Disease Control  (CDC) for  adults born from [birth year] through 1963.   Completed  . Pneumonia vaccines  Completed

## 2018-02-16 ENCOUNTER — Ambulatory Visit: Payer: Medicare Other | Admitting: Orthotics

## 2018-02-16 DIAGNOSIS — M79672 Pain in left foot: Secondary | ICD-10-CM

## 2018-02-16 DIAGNOSIS — G629 Polyneuropathy, unspecified: Secondary | ICD-10-CM

## 2018-02-16 DIAGNOSIS — M76822 Posterior tibial tendinitis, left leg: Secondary | ICD-10-CM

## 2018-02-16 DIAGNOSIS — R200 Anesthesia of skin: Secondary | ICD-10-CM

## 2018-02-16 DIAGNOSIS — R202 Paresthesia of skin: Secondary | ICD-10-CM

## 2018-02-16 DIAGNOSIS — M79671 Pain in right foot: Secondary | ICD-10-CM

## 2018-02-16 NOTE — Progress Notes (Signed)
Patient picked up replacement pair of shoes to go with brace.

## 2018-03-01 ENCOUNTER — Other Ambulatory Visit: Payer: Self-pay | Admitting: Psychiatry

## 2018-03-01 DIAGNOSIS — F4310 Post-traumatic stress disorder, unspecified: Secondary | ICD-10-CM

## 2018-03-01 DIAGNOSIS — F331 Major depressive disorder, recurrent, moderate: Secondary | ICD-10-CM

## 2018-03-01 DIAGNOSIS — F411 Generalized anxiety disorder: Secondary | ICD-10-CM

## 2018-03-09 ENCOUNTER — Telehealth: Payer: Self-pay | Admitting: Radiology

## 2018-03-09 ENCOUNTER — Other Ambulatory Visit (INDEPENDENT_AMBULATORY_CARE_PROVIDER_SITE_OTHER): Payer: Medicare Other

## 2018-03-09 ENCOUNTER — Other Ambulatory Visit: Payer: Self-pay | Admitting: Internal Medicine

## 2018-03-09 DIAGNOSIS — E7800 Pure hypercholesterolemia, unspecified: Secondary | ICD-10-CM

## 2018-03-09 DIAGNOSIS — I10 Essential (primary) hypertension: Secondary | ICD-10-CM

## 2018-03-09 LAB — BASIC METABOLIC PANEL
BUN: 17 mg/dL (ref 6–23)
CHLORIDE: 101 meq/L (ref 96–112)
CO2: 34 mEq/L — ABNORMAL HIGH (ref 19–32)
Calcium: 10.1 mg/dL (ref 8.4–10.5)
Creatinine, Ser: 0.57 mg/dL (ref 0.40–1.20)
GFR: 104.57 mL/min (ref >60.00)
Glucose, Bld: 87 mg/dL (ref 70–99)
Potassium: 3.9 mEq/L (ref 3.5–5.1)
Sodium: 141 mEq/L (ref 135–145)

## 2018-03-09 LAB — HEPATIC FUNCTION PANEL
ALT: 29 U/L (ref 0–35)
AST: 20 U/L (ref 0–37)
Albumin: 4.4 g/dL (ref 3.5–5.2)
Alkaline Phosphatase: 62 U/L (ref 39–117)
Bilirubin, Direct: 0.1 mg/dL (ref 0.0–0.3)
Total Bilirubin: 0.6 mg/dL (ref 0.2–1.2)
Total Protein: 6.7 g/dL (ref 6.0–8.3)

## 2018-03-09 LAB — LIPID PANEL
Cholesterol: 196 mg/dL (ref 0–200)
HDL: 72.1 mg/dL (ref >39.00)
LDL Cholesterol: 100 mg/dL — ABNORMAL HIGH (ref 0–99)
NonHDL: 124.15
Total CHOL/HDL Ratio: 3
Triglycerides: 119 mg/dL (ref 0.0–149.0)
VLDL: 23.8 mg/dL (ref 0.0–40.0)

## 2018-03-09 NOTE — Telephone Encounter (Signed)
I placed the orders in the lab encounter as routine, let me know if I need to change anything. Thank you!

## 2018-03-09 NOTE — Telephone Encounter (Signed)
Pt came in for labs today, please place future orders. Thank you.

## 2018-03-14 ENCOUNTER — Ambulatory Visit (INDEPENDENT_AMBULATORY_CARE_PROVIDER_SITE_OTHER): Payer: Medicare Other | Admitting: Internal Medicine

## 2018-03-14 ENCOUNTER — Encounter: Payer: Self-pay | Admitting: Internal Medicine

## 2018-03-14 DIAGNOSIS — K2970 Gastritis, unspecified, without bleeding: Secondary | ICD-10-CM

## 2018-03-14 DIAGNOSIS — E210 Primary hyperparathyroidism: Secondary | ICD-10-CM | POA: Diagnosis not present

## 2018-03-14 DIAGNOSIS — F419 Anxiety disorder, unspecified: Secondary | ICD-10-CM

## 2018-03-14 DIAGNOSIS — F32A Depression, unspecified: Secondary | ICD-10-CM

## 2018-03-14 DIAGNOSIS — F329 Major depressive disorder, single episode, unspecified: Secondary | ICD-10-CM

## 2018-03-14 DIAGNOSIS — D649 Anemia, unspecified: Secondary | ICD-10-CM

## 2018-03-14 DIAGNOSIS — I10 Essential (primary) hypertension: Secondary | ICD-10-CM | POA: Diagnosis not present

## 2018-03-14 DIAGNOSIS — J449 Chronic obstructive pulmonary disease, unspecified: Secondary | ICD-10-CM

## 2018-03-14 DIAGNOSIS — E7800 Pure hypercholesterolemia, unspecified: Secondary | ICD-10-CM

## 2018-03-14 DIAGNOSIS — G629 Polyneuropathy, unspecified: Secondary | ICD-10-CM

## 2018-03-14 NOTE — Patient Instructions (Signed)
Take to protonix (pantoprazole) 30 minutes before breakfast and pepcid 20mg  before your evening meal.

## 2018-03-14 NOTE — Progress Notes (Signed)
Patient ID: [name], female   DOB: 01-13-1946, 73 y.o.   MRN: [id number]   Subjective:    Patient ID: [name], female    DOB: [birth year], 73 y.o.   MRN: [id number]  HPI  Patient here for a scheduled follow up. She reports she is doing relatively well.  Increased stress.  She has been seeing psychiatry.  Was on wellbutrin and hydroxyzine.  Off now.  Discussed with her at length.  She does not feel needs f/u at this time.  Discussed f/u with a counselor.  She will consider f/u with her counselor.  Desires no medication at this time.  No chest pain.  Breathing stable.  Acid reflux.  No abdominal pain.  Bowels moving.  Saw Dr Bary Castilla 12/2017.  Biopsy right breast - fat necrosis.  Continues f/u with Dr Bary Castilla.  Has f/u 05/2018.   Seeing neurology.  Diagnosed with peripheral neuropathy.  On gabapentin and alpha lipoic acid.  Has f/u with podiatry.  Discussed recent labs.     Past Medical History:  Diagnosis Date  . Anemia    takes iron supplements  . Anxiety   . Arthritis   . Breast mass, right 12/2017  . Burning feet syndrome   . Chronic insomnia   . Complication of anesthesia    "AFFECTS MY MEMORY"  . COPD (chronic obstructive pulmonary disease) ([place])    documented in outside records. patient denies this diagnosis  . Depression   . Dysrhythmia    PALPITATIONS when anxious  . Edema    MILD ANKLE  . GERD (gastroesophageal reflux disease)   . Heart murmur    current small sign of heart murmur  . History of stomach ulcers   . HOH (hard of hearing)    AIDS but doesn't always wear them  . Hypercholesterolemia   . Hyperparathyroidism, primary ([place])   . Hypertension   . Neuropathy    arms, feet, legs, neck  . Osteoporosis   . Palpitations   . Scoliosis   . Shortness of breath    with exertion  . Tingling    both hands   Past Surgical History:  Procedure Laterality Date  . ANTERIOR CERVICAL DECOMP/DISCECTOMY FUSION  10/28/2011   Procedure: ANTERIOR CERVICAL  DECOMPRESSION/DISCECTOMY FUSION 2 LEVELS;  Surgeon: Elaina Hoops, MD;  Location: [place] [facility];  Service: Neurosurgery;  Laterality: N/A;  . BREAST BIOPSY Right 1992   neg  . BREAST BIOPSY Right 12/17/2017   Procedure: BREAST BIOPSY;  Surgeon: Robert Bellow, MD;  Location: [facility];  Service: General;  Laterality: Right;  . BREAST SURGERY     RT BREAST CYST REMOVED 15 YRS AGO   . CATARACT EXTRACTION W/PHACO Right 10/30/2014   Procedure: CATARACT EXTRACTION PHACO AND INTRAOCULAR LENS PLACEMENT (IOC);  Surgeon: Birder Robson, MD;  Location: [facility];  Service: Ophthalmology;  Laterality: Right;  US0:40 AP18.2 CDE7.42  [place] #[id number]  . CATARACT EXTRACTION W/PHACO Left 11/27/2014   Procedure: CATARACT EXTRACTION PHACO AND INTRAOCULAR LENS PLACEMENT (IOC);  Surgeon: Birder Robson, MD;  Location: [facility];  Service: Ophthalmology;  Laterality: Left;  [place] 00:36.9 AP%: 18.9 CDE: 6.96  [place] #  [id number]  . CYSTOCELE REPAIR N/A 05/13/2015   Procedure: ANTERIOR REPAIR (CYSTOCELE);  Surgeon: Brayton Mars, MD;  Location: [facility];  Service: Gynecology;  Laterality: N/A;  . CYSTOSCOPY  05/13/2015   Procedure: CYSTOSCOPY;  Surgeon: Brayton Mars, MD;  Location: [facility];  Service: Gynecology;;  .  dental implant    . DILATION AND CURETTAGE OF UTERUS     X2   . EYE SURGERY Bilateral    cataract extractions  . JOINT REPLACEMENT     RT HIP  2013  (PARTIAL)   . LAPAROSCOPY  2005   bowel obstruction  . PARATHYROIDECTOMY Left 08/25/2012   Procedure: PARATHYROIDECTOMY;  Surgeon: Earnstine Regal, MD;  Location: [facility];  Service: General;  Laterality: Left;  . PITUITARY SURGERY     FOR TUMOR 3 YRS   . REFRACTIVE SURGERY    . TOTAL KNEE ARTHROPLASTY Right 07/18/2014   Procedure: TOTAL KNEE ARTHROPLASTY;  Surgeon: Dereck Leep, MD;  Location: [facility];  Service: Orthopedics;  Laterality: Right;  Marland Kitchen VAGINAL HYSTERECTOMY Bilateral 05/13/2015   Procedure: HYSTERECTOMY VAGINAL WITH  BILATERAL SALPINGO OOPHERECTOMY;  Surgeon: Brayton Mars, MD;  Location: [facility];  Service: Gynecology;  Laterality: Bilateral;  . VAGINAL HYSTERECTOMY     bso ant repair   Family History  Problem Relation Age of Onset  . Glaucoma Mother   . Hypertension Mother   . Thyroid disease Mother   . Stroke Father   . Hypertension Father   . Bipolar disorder Sister   . Breast cancer Paternal Aunt        60's  . Cancer Neg Hx   . Diabetes Neg Hx    Social History   Socioeconomic History  . Marital status: Divorced    Spouse name: Not on file  . Number of children: 1  . Years of education: Not on file  . Highest education level: Some college, no degree  Occupational History  . 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    Employer: [employer]    Comment: retired  Scientific laboratory technician  . Financial resource strain: Hard  . Food insecurity:    Worry: Often true    Inability: Often true  . Transportation needs:    Medical: No    Non-medical: No  Tobacco Use  . Smoking status: Former Smoker    Packs/day: 1.00    Years: 35.00    Pack years: 35.00    Types: Cigarettes    Last attempt to quit: 02/10/2008    Years since quitting: 10.1  . Smokeless tobacco: Never Used  Substance and Sexual Activity  . Alcohol use: Yes    Alcohol/week: 0.0 standard drinks    Comment: rare, like holidays  . Drug use: No  . Sexual activity: Never    Birth control/protection: Post-menopausal  Lifestyle  . Physical activity:    Days per week: 0 days    Minutes per session: 0 min  . Stress: Rather much  Relationships  . Social connections:    Talks on phone: Not on file    Gets together: Not on file    Attends religious service: Never    Active member of club or organization: No    Attends meetings of clubs or organizations: Never    Relationship status: Divorced  Other Topics Concern  . Not on file  Social History Narrative  . Not on file    Outpatient Encounter  Medications as of 03/14/2018  Medication Sig  . acetaminophen (TYLENOL) 500 MG tablet Take 1,000 mg by mouth every 6 (six) hours as needed for moderate pain, fever or headache.   . Alpha-Lipoic Acid 600 MG CAPS Take 600 mg by mouth daily.   Marland Kitchen amLODipine (NORVASC) 5 MG tablet Take 1 tablet (5 mg total) by mouth  daily.  . aspirin EC 81 MG tablet Take 81 mg by mouth 4 (four) times a week.   Marland Kitchen atorvastatin (LIPITOR) 40 MG tablet TAKE ONE TABLET EVERY DAY  . Biotin w/ Vitamins C & E (HAIR/SKIN/NAILS PO) Take 1 tablet by mouth daily.  . chlorthalidone (HYGROTON) 25 MG tablet TAKE ONE TABLET EVERY DAY (Patient taking differently: Take 25 mg by mouth daily. )  . cholecalciferol (VITAMIN D) 1000 UNITS tablet Take 1,000 Units by mouth daily.   Marland Kitchen docusate sodium (COLACE) 100 MG capsule Take 100 mg by mouth daily.   . ferrous sulfate 325 (65 FE) MG tablet Take 325 mg by mouth daily.  Marland Kitchen gabapentin (NEURONTIN) 300 MG capsule Take 600 mg by mouth at bedtime.   Marland Kitchen losartan (COZAAR) 100 MG tablet TAKE ONE TABLET EVERY DAY  . magnesium oxide (MAG-OX) 400 MG tablet Take 400 mg by mouth daily.  Marland Kitchen MEGARED OMEGA-3 KRILL OIL PO Take 1 capsule by mouth at bedtime.  . pantoprazole (PROTONIX) 40 MG tablet TAKE ONE TABLET EVERY DAY  . Polyethyl Glycol-Propyl Glycol (SYSTANE) 0.4-0.3 % SOLN Place 1 drop into both eyes 2 (two) times daily as needed (for dry eyes).  . potassium chloride (K-DUR,KLOR-CON) 10 MEQ tablet Take 1 tablet (10 mEq total) by mouth 2 (two) times daily.  . Probiotic Product (FLORAJEN3 PO) Take 1 capsule by mouth daily.   . ranitidine (ZANTAC) 150 MG tablet Take 75 mg by mouth daily.   Marland Kitchen senna-docusate (SENOKOT-S) 8.6-50 MG tablet Take 1 tablet by mouth at bedtime as needed for mild constipation.  . [DISCONTINUED] buPROPion (WELLBUTRIN) 100 MG tablet TAKE ONE TABLET BY MOUTH EVERY MORNING  . [DISCONTINUED] hydrOXYzine (ATARAX/VISTARIL) 10 MG tablet Take 1-2 tablets (10-20 mg total) by mouth daily as  needed for anxiety (FOR ANXIETY).   No facility-administered encounter medications on file as of 03/14/2018.     Review of Systems  Constitutional: Negative for appetite change and unexpected weight change.  HENT: Negative for congestion and sinus pressure.   Respiratory: Negative for cough, chest tightness and shortness of breath.   Cardiovascular: Negative for chest pain, palpitations and leg swelling.  Gastrointestinal: Negative for abdominal pain, diarrhea, nausea and vomiting.  Genitourinary: Negative for difficulty urinating and dysuria.  Musculoskeletal: Negative for joint swelling and myalgias.  Skin: Negative for color change and rash.  Neurological: Negative for dizziness, light-headedness and headaches.  Psychiatric/Behavioral: Negative for agitation and dysphoric mood.       Objective:    Physical Exam Constitutional:      General: She is not in acute distress.    Appearance: Normal appearance.  HENT:     Nose: Nose normal. No congestion.     Mouth/Throat:     Pharynx: No oropharyngeal exudate or posterior oropharyngeal erythema.  Neck:     Musculoskeletal: Neck supple. No muscular tenderness.     Thyroid: No thyromegaly.  Cardiovascular:     Rate and Rhythm: Normal rate and regular rhythm.  Pulmonary:     Effort: No respiratory distress.     Breath sounds: Normal breath sounds. No wheezing.  Abdominal:     General: Bowel sounds are normal.     Palpations: Abdomen is soft.     Tenderness: There is no abdominal tenderness.  Musculoskeletal:        General: No swelling or tenderness.  Lymphadenopathy:     Cervical: No cervical adenopathy.  Skin:    Findings: No erythema or rash.  Neurological:  Mental Status: She is alert.  Psychiatric:        Mood and Affect: Mood normal.        Behavior: Behavior normal.     BP 122/72 (BP Location: Left Arm, Patient Position: Sitting, Cuff Size: Normal)   Pulse 76   Temp (!) 97.4 F (36.3 C) (Oral)   Resp 16    Wt 133 lb 9.6 oz (60.6 kg)   SpO2 97%   BMI 21.56 kg/m  Wt Readings from Last 3 Encounters:  03/14/18 133 lb 9.6 oz (60.6 kg)  02/08/18 132 lb (59.9 kg)  01/25/18 133 lb (60.3 kg)     Lab Results  Component Value Date   WBC 6.5 12/14/2017   HGB 14.6 12/17/2017   HCT 43.0 12/17/2017   PLT 300 12/14/2017   GLUCOSE 87 03/09/2018   CHOL 196 03/09/2018   TRIG 119.0 03/09/2018   HDL 72.10 03/09/2018   LDLDIRECT 86.0 05/26/2016   LDLCALC 100 (H) 03/09/2018   ALT 29 03/09/2018   AST 20 03/09/2018   NA 141 03/09/2018   K 3.9 03/09/2018   CL 101 03/09/2018   CREATININE 0.57 03/09/2018   BUN 17 03/09/2018   CO2 34 (H) 03/09/2018   TSH 1.09 11/22/2017   INR 0.91 07/05/2014   HGBA1C 5.8 05/26/2016    Mr Brain W Wo Contrast  Result Date: 01/19/2018 CLINICAL DATA:  Follow-up pituitary adenoma. EXAM: MRI HEAD WITHOUT AND WITH CONTRAST TECHNIQUE: Multiplanar, multiecho pulse sequences of the brain and surrounding structures were obtained without and with intravenous contrast. CONTRAST:  6 mL Gadavist COMPARISON:  02/11/2016 FINDINGS: Brain: There is no evidence of acute infarct, midline shift, or extra-axial fluid collection. 10 x 7 x 5 mm T2 hypointense nodule superiorly in the third ventricle is unchanged and may represent a colloid cyst. There is no evidence of obstructive hydrocephalus. Small foci of T2 hyperintensity are most notable in the periventricular white matter, unchanged and nonspecific but compatible with mild chronic small vessel ischemic disease. Cerebral volume is within normal limits for age. Dedicated imaging was performed through the sella turcica. Hypoenhancing lesion in the right aspect of the pituitary gland is unchanged in size and measures 10 x 8 x 12 mm (transverse x craniocaudal x AP). The lesion again bulges into the right cavernous sinus. The infundibulum remains deviated leftward. The mass extends into the suprasellar cistern without mass effect on the optic  chiasm. Vascular: Major intracranial vascular flow voids are preserved. Skull and upper cervical spine: Unremarkable bone marrow signal. Anterior cervical fusion. Unchanged arachnoid cyst or large arachnoid granulation near the torcula with thinning of the occipital bone. Sinuses/Orbits: Bilateral cataract extraction. Paranasal sinuses and mastoid air cells are clear. Other: None. IMPRESSION: 1. Unchanged pituitary macroadenoma. 2. 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 No hydrocephalus. 3. Mild chronic small vessel ischemic disease. Electronically Signed   By: Logan Bores M.D.   On: 01/19/2018 09:08       Assessment & Plan:   Problem List Items Addressed This Visit    Anemia    Follow cbc.       Anxiety    Has been followed by psychiatry.  Discussed f/u with her counselor.        COPD (chronic obstructive pulmonary disease) ([facility])    Breathing stable.        Depression    Was being followed by psychiatry.  Discussed f/u with counselor.        Gastritis  Some reflux.  Take protonix 20 minutes before breakfast and pepcid 30 minutes before evening meal.  Follow.        Hypercholesterolemia    On lipitor.  Follow lipid panel and liver function tests.        Hyperparathyroidism, primary [facility])    S/p surgery - Dr Harlow Asa.  Follow calcium.       Hypertension    Blood pressure under good control.  Continue same medication regimen.  Follow pressures.  Follow metabolic panel.        Neuropathy    On alpha lipoic acid and gabapentin.  Seeing neurology.            Einar Pheasant, MD

## 2018-03-20 ENCOUNTER — Encounter: Payer: Self-pay | Admitting: Internal Medicine

## 2018-03-20 NOTE — Assessment & Plan Note (Signed)
On lipitor.  Follow lipid panel and liver function tests.   

## 2018-03-20 NOTE — Assessment & Plan Note (Signed)
Follow cbc.  

## 2018-03-20 NOTE — Assessment & Plan Note (Signed)
S/p surgery.  Dr Gerkin.  Follow calcium.  

## 2018-03-20 NOTE — Assessment & Plan Note (Signed)
Was being followed by psychiatry.  Discussed f/u with counselor.

## 2018-03-20 NOTE — Assessment & Plan Note (Addendum)
Some reflux.  Take protonix 20 minutes before breakfast and pepcid 30 minutes before evening meal.  Follow.

## 2018-03-20 NOTE — Assessment & Plan Note (Signed)
On alpha lipoic acid and gabapentin.  Seeing neurology.

## 2018-03-20 NOTE — Assessment & Plan Note (Signed)
Has been followed by psychiatry.  Discussed f/u with her counselor.

## 2018-03-20 NOTE — Assessment & Plan Note (Signed)
Breathing stable.

## 2018-03-20 NOTE — Assessment & Plan Note (Signed)
Blood pressure under good control.  Continue same medication regimen.  Follow pressures.  Follow metabolic panel.   

## 2018-04-04 ENCOUNTER — Other Ambulatory Visit: Payer: Self-pay

## 2018-04-04 ENCOUNTER — Telehealth: Payer: Self-pay | Admitting: *Deleted

## 2018-04-04 NOTE — Telephone Encounter (Signed)
Pt did not show for lab appt today

## 2018-04-04 NOTE — Telephone Encounter (Signed)
Pt has an appt today (soon). No orders found & cant not find anything in the notes to show what is needed.   Please place future orders

## 2018-04-13 ENCOUNTER — Telehealth: Payer: Self-pay

## 2018-04-13 DIAGNOSIS — D352 Benign neoplasm of pituitary gland: Secondary | ICD-10-CM | POA: Diagnosis not present

## 2018-04-13 DIAGNOSIS — G608 Other hereditary and idiopathic neuropathies: Secondary | ICD-10-CM | POA: Diagnosis not present

## 2018-04-13 DIAGNOSIS — R42 Dizziness and giddiness: Secondary | ICD-10-CM | POA: Diagnosis not present

## 2018-04-13 DIAGNOSIS — M545 Low back pain: Secondary | ICD-10-CM | POA: Diagnosis not present

## 2018-04-13 NOTE — Telephone Encounter (Signed)
Copied from [facility] [phone]. Topic: General - Inquiry >> Apr 13, 2018 10:19 AM Rutherford Nail, NT wrote: Reason for CRM: Patient calling and states that she just realized she missed her lab appointment on 2/24/202. Would like to know if this is something that needs to be rescheduled or not? Please advise.

## 2018-04-13 NOTE — Telephone Encounter (Signed)
Advised pt that lab was not needed/

## 2018-05-31 ENCOUNTER — Ambulatory Visit: Payer: Medicare Other | Admitting: General Surgery

## 2018-06-16 ENCOUNTER — Other Ambulatory Visit: Payer: Self-pay | Admitting: Internal Medicine

## 2018-06-21 ENCOUNTER — Ambulatory Visit: Payer: Medicare Other | Admitting: General Surgery

## 2018-06-24 ENCOUNTER — Encounter: Payer: Self-pay | Admitting: Internal Medicine

## 2018-06-24 ENCOUNTER — Other Ambulatory Visit: Payer: Self-pay

## 2018-06-24 ENCOUNTER — Ambulatory Visit (INDEPENDENT_AMBULATORY_CARE_PROVIDER_SITE_OTHER): Payer: Medicare Other | Admitting: Internal Medicine

## 2018-06-24 DIAGNOSIS — J449 Chronic obstructive pulmonary disease, unspecified: Secondary | ICD-10-CM | POA: Diagnosis not present

## 2018-06-24 DIAGNOSIS — E210 Primary hyperparathyroidism: Secondary | ICD-10-CM

## 2018-06-24 DIAGNOSIS — D649 Anemia, unspecified: Secondary | ICD-10-CM

## 2018-06-24 DIAGNOSIS — K2970 Gastritis, unspecified, without bleeding: Secondary | ICD-10-CM

## 2018-06-24 DIAGNOSIS — N6452 Nipple discharge: Secondary | ICD-10-CM

## 2018-06-24 DIAGNOSIS — F329 Major depressive disorder, single episode, unspecified: Secondary | ICD-10-CM | POA: Diagnosis not present

## 2018-06-24 DIAGNOSIS — F419 Anxiety disorder, unspecified: Secondary | ICD-10-CM

## 2018-06-24 DIAGNOSIS — E7800 Pure hypercholesterolemia, unspecified: Secondary | ICD-10-CM

## 2018-06-24 DIAGNOSIS — I10 Essential (primary) hypertension: Secondary | ICD-10-CM

## 2018-06-24 DIAGNOSIS — D352 Benign neoplasm of pituitary gland: Secondary | ICD-10-CM

## 2018-06-24 DIAGNOSIS — M5440 Lumbago with sciatica, unspecified side: Secondary | ICD-10-CM

## 2018-06-24 DIAGNOSIS — F32A Depression, unspecified: Secondary | ICD-10-CM

## 2018-06-24 NOTE — Progress Notes (Signed)
Patient ID: [name], female   DOB: Nov 23, 1945, 73 y.o.   MRN: [id number]   Virtual Visit via telephone Note  This visit type was conducted due to national recommendations for restrictions regarding the COVID-19 pandemic (e.g. social distancing).  This format is felt to be most appropriate for this patient at this time.  All issues noted in this document were discussed and addressed.  No physical exam was performed (except for noted visual exam findings with Video Visits).   I connected with [name] by telephone and verified that I am speaking with the correct person using two identifiers. Location patient: home Location provider: work Persons participating in the telephone visit: patient, provider  I discussed the limitations, risks, security and privacy concerns of performing an evaluation and management service by telephone and the availability of in person appointments. The patient expressed understanding and agreed to proceed.   Reason for visit: scheduled follow up.    HPI: She has been seeing neurology for f/u peripheral neuropathy.  On gabapentin.  Also taking alpha lipoic acid.  Has noticed some increased right side/back pain.  Worse when she sweeps or vacuums.  Some pain down leg.  Has a history of back pain.  Has seen Dr Arnoldo Morale.  Neurology ordered PT.  She is agreeable for therapy once able to schedule.  No chest pain.  No sob.  Trying to walk.  Walking 30 minutes 2-3x/day.  No abdominal pain.  Bowels moving.  Previously saw Dr Bary Castilla 05/2018. Biopsy - fat necrosis.  Has f/u planned end of May.  Taking protonix/pepcid.  Controlling acid reflux.     ROS: See pertinent positives and negatives per HPI.  Past Medical History:  Diagnosis Date  . Anemia    takes iron supplements  . Anxiety   . Arthritis   . Breast mass, right 12/2017  . Burning feet syndrome   . Chronic insomnia   . Complication of anesthesia    "AFFECTS MY MEMORY"  . COPD (chronic obstructive  pulmonary disease) ([place])    documented in outside records. patient denies this diagnosis  . Depression   . Dysrhythmia    PALPITATIONS when anxious  . Edema    MILD ANKLE  . GERD (gastroesophageal reflux disease)   . Heart murmur    current small sign of heart murmur  . History of stomach ulcers   . HOH (hard of hearing)    AIDS but doesn't always wear them  . Hypercholesterolemia   . Hyperparathyroidism, primary ([place])   . Hypertension   . Neuropathy    arms, feet, legs, neck  . Osteoporosis   . Palpitations   . Scoliosis   . Shortness of breath    with exertion  . Tingling    both hands    Past Surgical History:  Procedure Laterality Date  . ANTERIOR CERVICAL DECOMP/DISCECTOMY FUSION  10/28/2011   Procedure: ANTERIOR CERVICAL DECOMPRESSION/DISCECTOMY FUSION 2 LEVELS;  Surgeon: Elaina Hoops, MD;  Location: [facility];  Service: Neurosurgery;  Laterality: N/A;  . BREAST BIOPSY Right 1992   neg  . BREAST BIOPSY Right 12/17/2017   Procedure: BREAST BIOPSY;  Surgeon: Robert Bellow, MD;  Location: [facility];  Service: General;  Laterality: Right;  . BREAST SURGERY     RT BREAST CYST REMOVED 15 YRS AGO   . CATARACT EXTRACTION W/PHACO Right 10/30/2014   Procedure: CATARACT EXTRACTION PHACO AND INTRAOCULAR LENS PLACEMENT (IOC);  Surgeon: Birder Robson, MD;  Location: [facility];  Service: Ophthalmology;  Laterality: Right;  US0:40 AP18.2 CDE7.42  [place] lot #[id number]  . CATARACT EXTRACTION W/PHACO Left 11/27/2014   Procedure: CATARACT EXTRACTION PHACO AND INTRAOCULAR LENS PLACEMENT (IOC);  Surgeon: Birder Robson, MD;  Location: [facility];  Service: Ophthalmology;  Laterality: Left;  [place] 00:36.9 AP%: 18.9 CDE: 6.96  cas[place] lot #  [id number]  . CYSTOCELE REPAIR N/A 05/13/2015   Procedure: ANTERIOR REPAIR (CYSTOCELE);  Surgeon: Brayton Mars, MD;  Location: [facility];  Service: Gynecology;  Laterality: N/A;  . CYSTOSCOPY  05/13/2015   Procedure: CYSTOSCOPY;  Surgeon:  Brayton Mars, MD;  Location: [facility];  Service: Gynecology;;  . dental implant    . DILATION AND CURETTAGE OF UTERUS     X2   . EYE SURGERY Bilateral    cataract extractions  . JOINT REPLACEMENT     RT HIP  2013  (PARTIAL)   . LAPAROSCOPY  2005   bowel obstruction  . PARATHYROIDECTOMY Left 08/25/2012   Procedure: PARATHYROIDECTOMY;  Surgeon: Earnstine Regal, MD;  Location: [facility];  Service: General;  Laterality: Left;  . PITUITARY SURGERY     FOR TUMOR 3 YRS   . REFRACTIVE SURGERY    . TOTAL KNEE ARTHROPLASTY Right 07/18/2014   Procedure: TOTAL KNEE ARTHROPLASTY;  Surgeon: Dereck Leep, MD;  Location: [facility];  Service: Orthopedics;  Laterality: Right;  Marland Kitchen VAGINAL HYSTERECTOMY Bilateral 05/13/2015   Procedure: HYSTERECTOMY VAGINAL WITH BILATERAL SALPINGO OOPHERECTOMY;  Surgeon: Brayton Mars, MD;  Location: [facility];  Service: Gynecology;  Laterality: Bilateral;  . VAGINAL HYSTERECTOMY     bso ant repair    Family History  Problem Relation Age of Onset  . Glaucoma Mother   . Hypertension Mother   . Thyroid disease Mother   . Stroke Father   . Hypertension Father   . Bipolar disorder Sister   . Breast cancer Paternal Aunt        60's  . Cancer Neg Hx   . Diabetes Neg Hx     SOCIAL HX: reviewed.    Current Outpatient Medications:  .  acetaminophen (TYLENOL) 500 MG tablet, Take 1,000 mg by mouth every 6 (six) hours as needed for moderate pain, fever or headache. , Disp: , Rfl:  .  Alpha-Lipoic Acid 600 MG CAPS, Take 600 mg by mouth daily. , Disp: , Rfl:  .  amLODipine (NORVASC) 5 MG tablet, Take 1 tablet (5 mg total) by mouth daily., Disp: 90 tablet, Rfl: 1 .  aspirin EC 81 MG tablet, Take 81 mg by mouth 4 (four) times a week. , Disp: , Rfl:  .  atorvastatin (LIPITOR) 40 MG tablet, TAKE ONE TABLET EVERY DAY, Disp: 90 tablet, Rfl: 1 .  Biotin w/ Vitamins C & E (HAIR/SKIN/NAILS PO), Take 1 tablet by mouth daily., Disp: , Rfl:  .  chlorthalidone (HYGROTON) 25 MG  tablet, TAKE ONE TABLET EVERY DAY (Patient taking differently: Take 25 mg by mouth daily. ), Disp: 90 tablet, Rfl: 1 .  cholecalciferol (VITAMIN D) 1000 UNITS tablet, Take 1,000 Units by mouth daily. , Disp: , Rfl:  .  docusate sodium (COLACE) 100 MG capsule, Take 100 mg by mouth daily. , Disp: , Rfl:  .  ferrous sulfate 325 (65 FE) MG tablet, Take 325 mg by mouth daily., Disp: , Rfl:  .  gabapentin (NEURONTIN) 100 MG capsule, Take 1 capsule by mouth at bedtime., Disp: , Rfl:  .  gabapentin (NEURONTIN) 300 MG capsule, Take 600 mg  by mouth at bedtime. , Disp: , Rfl:  .  losartan (COZAAR) 100 MG tablet, TAKE ONE TABLET EVERY DAY, Disp: 90 tablet, Rfl: 1 .  magnesium oxide (MAG-OX) 400 MG tablet, Take 400 mg by mouth daily., Disp: , Rfl:  .  MEGARED OMEGA-3 KRILL OIL PO, Take 1 capsule by mouth at bedtime., Disp: , Rfl:  .  pantoprazole (PROTONIX) 40 MG tablet, TAKE ONE TABLET EVERY DAY, Disp: 60 tablet, Rfl: 3 .  Polyethyl Glycol-Propyl Glycol (SYSTANE) 0.4-0.3 % SOLN, Place 1 drop into both eyes 2 (two) times daily as needed (for dry eyes)., Disp: , Rfl:  .  potassium chloride (K-DUR) 10 MEQ tablet, TAKE ONE TABLET BY MOUTH TWICE DAILY, Disp: 180 tablet, Rfl: 0 .  Probiotic Product (FLORAJEN3 PO), Take 1 capsule by mouth daily. , Disp: , Rfl:  .  senna-docusate (SENOKOT-S) 8.6-50 MG tablet, Take 1 tablet by mouth at bedtime as needed for mild constipation., Disp: , Rfl:   EXAM:  GENERAL: alert.  Sounds to be in no acute distress.  Answering questions appropriately.    PSYCH/NEURO: pleasant and cooperative, no obvious depression or anxiety, speech and thought processing grossly intact  ASSESSMENT AND PLAN:  Discussed the following assessment and plan:  Anemia, unspecified type - Plan: Vitamin B12  Anxiety  Chronic obstructive pulmonary disease, unspecified COPD type ([place])  Depression, unspecified depression type  Discharge from right nipple  Gastritis without bleeding, unspecified  chronicity, unspecified gastritis type  Hypercholesterolemia - Plan: Hepatic function panel, Lipid panel  Hyperparathyroidism, primary ([place])  Essential hypertension - Plan: CBC with Differential/Platelet, TSH, Basic metabolic panel  Pituitary adenoma ([place]) - Plan: Prolactin  Bilateral low back pain with sciatica, sciatica laterality unspecified, unspecified chronicity - Plan: Ambulatory referral to Physical Therapy  Anemia Follow cbc.    Anxiety Has been followed by psychiatry.  Stable.  Follow.    COPD (chronic obstructive pulmonary disease) ([facility]) Breathing stable.   Depression Stable.  Follow.    Discharge from right nipple Saw Dr Bary Castilla.  S/p biopsy.  Fat necrosis.  Has f/u planned at the end of May.    Gastritis Doing well on current regimen.  Follow.    Hypercholesterolemia On lipitor.  Low cholesterol diet and exercise.  Follow lipid panel and liver function tests.    Hyperparathyroidism, primary S/p surgery.  Dr Harlow Asa.  Follow calcium.   Hypertension Blood pressure has been under good control.  Follow pressures.  Follow metabolic panel.  Same meds.   Pituitary adenoma ([place]) Noted on MRI.  Recent MRI stable.  Check prolactin level.  Also discuss with her regarding referral to endocrinology.    Low back pain Has seen Dr Arnoldo Morale.  Saw neurology.  The recommended PT referral.  Refer to physical therapy.     I discussed the assessment and treatment plan with the patient. The patient was provided an opportunity to ask questions and all were answered. The patient agreed with the plan and demonstrated an understanding of the instructions.   The patient was advised to call back or seek an in-person evaluation if the symptoms worsen or if the condition fails to improve as anticipated.  I provided 25 minutes of non-face-to-face time during this encounter.   Einar Pheasant, MD

## 2018-06-26 ENCOUNTER — Encounter: Payer: Self-pay | Admitting: Internal Medicine

## 2018-06-26 DIAGNOSIS — D352 Benign neoplasm of pituitary gland: Secondary | ICD-10-CM | POA: Insufficient documentation

## 2018-06-26 NOTE — Assessment & Plan Note (Signed)
Blood pressure has been under good control.  Follow pressures.  Follow metabolic panel.  Same meds.

## 2018-06-26 NOTE — Assessment & Plan Note (Signed)
S/p surgery.  Dr Harlow Asa.  Follow calcium.

## 2018-06-26 NOTE — Assessment & Plan Note (Signed)
Noted on MRI.  Recent MRI stable.  Check prolactin level.  Also discuss with her regarding referral to endocrinology.

## 2018-06-26 NOTE — Assessment & Plan Note (Signed)
Breathing stable.

## 2018-06-26 NOTE — Assessment & Plan Note (Signed)
Doing well on current regimen.  Follow.   

## 2018-06-26 NOTE — Assessment & Plan Note (Signed)
Stable.  Follow.   

## 2018-06-26 NOTE — Assessment & Plan Note (Signed)
Saw Dr Bary Castilla.  S/p biopsy.  Fat necrosis.  Has f/u planned at the end of May.

## 2018-06-26 NOTE — Assessment & Plan Note (Signed)
Follow cbc.  

## 2018-06-26 NOTE — Assessment & Plan Note (Signed)
On lipitor.  Low cholesterol diet and exercise.  Follow lipid panel and liver function tests.   

## 2018-06-26 NOTE — Assessment & Plan Note (Signed)
Has seen Dr Arnoldo Morale.  Saw neurology.  The recommended PT referral.  Refer to physical therapy.

## 2018-06-26 NOTE — Assessment & Plan Note (Signed)
Has been followed by psychiatry.  Stable.  Follow.

## 2018-06-28 ENCOUNTER — Other Ambulatory Visit (INDEPENDENT_AMBULATORY_CARE_PROVIDER_SITE_OTHER): Payer: Medicare Other

## 2018-06-28 ENCOUNTER — Other Ambulatory Visit: Payer: Self-pay

## 2018-06-28 ENCOUNTER — Other Ambulatory Visit: Payer: Self-pay | Admitting: Internal Medicine

## 2018-06-28 ENCOUNTER — Ambulatory Visit (INDEPENDENT_AMBULATORY_CARE_PROVIDER_SITE_OTHER): Payer: Medicare Other

## 2018-06-28 DIAGNOSIS — E7800 Pure hypercholesterolemia, unspecified: Secondary | ICD-10-CM

## 2018-06-28 DIAGNOSIS — M5441 Lumbago with sciatica, right side: Secondary | ICD-10-CM

## 2018-06-28 DIAGNOSIS — M47816 Spondylosis without myelopathy or radiculopathy, lumbar region: Secondary | ICD-10-CM | POA: Diagnosis not present

## 2018-06-28 DIAGNOSIS — D649 Anemia, unspecified: Secondary | ICD-10-CM

## 2018-06-28 DIAGNOSIS — D352 Benign neoplasm of pituitary gland: Secondary | ICD-10-CM

## 2018-06-28 DIAGNOSIS — I10 Essential (primary) hypertension: Secondary | ICD-10-CM

## 2018-06-28 LAB — HEPATIC FUNCTION PANEL
ALT: 23 U/L (ref 0–35)
AST: 20 U/L (ref 0–37)
Albumin: 4.8 g/dL (ref 3.5–5.2)
Alkaline Phosphatase: 66 U/L (ref 39–117)
Bilirubin, Direct: 0.2 mg/dL (ref 0.0–0.3)
Total Bilirubin: 0.9 mg/dL (ref 0.2–1.2)
Total Protein: 6.9 g/dL (ref 6.0–8.3)

## 2018-06-28 LAB — BASIC METABOLIC PANEL
BUN: 15 mg/dL (ref 6–23)
CO2: 33 mEq/L — ABNORMAL HIGH (ref 19–32)
Calcium: 10.1 mg/dL (ref 8.4–10.5)
Chloride: 99 mEq/L (ref 96–112)
Creatinine, Ser: 0.57 mg/dL (ref 0.40–1.20)
GFR: 104.48 mL/min (ref >60.00)
Glucose, Bld: 89 mg/dL (ref 70–99)
Potassium: 3.6 mEq/L (ref 3.5–5.1)
Sodium: 140 mEq/L (ref 135–145)

## 2018-06-28 LAB — CBC WITH DIFFERENTIAL/PLATELET
Basophils Absolute: 0.1 10*3/uL (ref 0.0–0.1)
Basophils Relative: 1.3 % (ref 0.0–3.0)
Eosinophils Absolute: 0.2 10*3/uL (ref 0.0–0.7)
Eosinophils Relative: 2.7 % (ref 0.0–5.0)
HCT: 44.3 % (ref 36.0–46.0)
Hemoglobin: 15 g/dL (ref 12.0–15.0)
Lymphocytes Relative: 29.5 % (ref 12.0–46.0)
Lymphs Abs: 1.7 10*3/uL (ref 0.7–4.0)
MCHC: 33.9 g/dL (ref 30.0–36.0)
MCV: 99 fl (ref 78.0–100.0)
Monocytes Absolute: 0.8 10*3/uL (ref 0.1–1.0)
Monocytes Relative: 14.4 % — ABNORMAL HIGH (ref 3.0–12.0)
Neutro Abs: 2.9 10*3/uL (ref 1.4–7.7)
Neutrophils Relative %: 52.1 % (ref 43.0–77.0)
Platelets: 274 10*3/uL (ref 150.0–400.0)
RBC: 4.48 Mil/uL (ref 3.87–5.11)
RDW: 12.9 % (ref 11.5–15.5)
WBC: 5.6 10*3/uL (ref 4.0–10.5)

## 2018-06-28 LAB — URINALYSIS, ROUTINE W REFLEX MICROSCOPIC
Bilirubin Urine: NEGATIVE
Hgb urine dipstick: NEGATIVE
Ketones, ur: NEGATIVE
Leukocytes,Ua: NEGATIVE
Nitrite: NEGATIVE
RBC / HPF: NONE SEEN (ref 0–?)
Specific Gravity, Urine: 1.015 (ref 1.000–1.030)
Total Protein, Urine: NEGATIVE
Urine Glucose: NEGATIVE
Urobilinogen, UA: 0.2 (ref 0.0–1.0)
pH: 7.5 (ref 5.0–8.0)

## 2018-06-28 LAB — TSH: TSH: 1.12 u[IU]/mL (ref 0.35–4.50)

## 2018-06-28 LAB — LIPID PANEL
Cholesterol: 196 mg/dL (ref 0–200)
HDL: 75.5 mg/dL (ref >39.00)
LDL Cholesterol: 98 mg/dL (ref 0–99)
NonHDL: 120.63
Total CHOL/HDL Ratio: 3
Triglycerides: 113 mg/dL (ref 0.0–149.0)
VLDL: 22.6 mg/dL (ref 0.0–40.0)

## 2018-06-28 LAB — VITAMIN B12: Vitamin B-12: 975 pg/mL — ABNORMAL HIGH (ref 211–911)

## 2018-06-28 NOTE — Progress Notes (Signed)
Orders placed for back xray and urine and culture.

## 2018-06-29 LAB — PROLACTIN: Prolactin: 8.5 ng/mL

## 2018-06-30 LAB — URINE CULTURE
MICRO NUMBER:: 487354
Result:: NO GROWTH
SPECIMEN QUALITY:: ADEQUATE

## 2018-07-19 ENCOUNTER — Encounter: Payer: Self-pay | Admitting: General Surgery

## 2018-07-19 ENCOUNTER — Other Ambulatory Visit: Payer: Self-pay

## 2018-07-19 ENCOUNTER — Ambulatory Visit: Payer: Medicare Other | Admitting: General Surgery

## 2018-07-19 VITALS — BP 124/78 | HR 78 | Temp 97.3°F | Ht 66.0 in | Wt 140.0 lb

## 2018-07-19 DIAGNOSIS — N6311 Unspecified lump in the right breast, upper outer quadrant: Secondary | ICD-10-CM | POA: Diagnosis not present

## 2018-07-19 NOTE — Progress Notes (Signed)
Patient ID: [name], female   DOB: 31-Dec-1945, 73 y.o.   MRN: [id number]  Chief Complaint  Patient presents with  . Follow-up    HPI [name] is a 73 y.o. female here for follow up from excisional biopsy of the left breast done 12/17/17.  HPI  Past Medical History:  Diagnosis Date  . Anemia    takes iron supplements  . Anxiety   . Arthritis   . Breast mass, right 12/2017  . Burning feet syndrome   . Chronic insomnia   . Complication of anesthesia    "AFFECTS MY MEMORY"  . COPD (chronic obstructive pulmonary disease) ([place])    documented in outside records. patient denies this diagnosis  . Depression   . Dysrhythmia    PALPITATIONS when anxious  . Edema    MILD ANKLE  . GERD (gastroesophageal reflux disease)   . Heart murmur    current small sign of heart murmur  . History of stomach ulcers   . HOH (hard of hearing)    AIDS but doesn't always wear them  . Hypercholesterolemia   . Hyperparathyroidism, primary ([place])   . Hypertension   . Neuropathy    arms, feet, legs, neck  . Osteoporosis   . Palpitations   . Scoliosis   . Shortness of breath    with exertion  . Tingling    both hands    Past Surgical History:  Procedure Laterality Date  . ANTERIOR CERVICAL DECOMP/DISCECTOMY FUSION  10/28/2011   Procedure: ANTERIOR CERVICAL DECOMPRESSION/DISCECTOMY FUSION 2 LEVELS;  Surgeon: Elaina Hoops, MD;  Location: [place] [facility];  Service: Neurosurgery;  Laterality: N/A;  . BREAST BIOPSY Right 1992   neg  . BREAST BIOPSY Right 12/17/2017   Procedure: BREAST BIOPSY;  Surgeon: Robert Bellow, MD;  Location: [facility];  Service: General;  Laterality: Right;  . BREAST SURGERY     RT BREAST CYST REMOVED 15 YRS AGO   . CATARACT EXTRACTION W/PHACO Right 10/30/2014   Procedure: CATARACT EXTRACTION PHACO AND INTRAOCULAR LENS PLACEMENT (IOC);  Surgeon: Birder Robson, MD;  Location: [facility];  Service: Ophthalmology;  Laterality: Right;   US0:40 AP18.2 CDE7.42  [place] lot #[id number]  . CATARACT EXTRACTION W/PHACO Left 11/27/2014   Procedure: CATARACT EXTRACTION PHACO AND INTRAOCULAR LENS PLACEMENT (IOC);  Surgeon: Birder Robson, MD;  Location: [facility];  Service: Ophthalmology;  Laterality: Left;  [place] 00:36.9 AP%: 18.9 CDE: 6.96  [place] lot #  [id number]  . CYSTOCELE REPAIR N/A 05/13/2015   Procedure: ANTERIOR REPAIR (CYSTOCELE);  Surgeon: Brayton Mars, MD;  Location: [facility];  Service: Gynecology;  Laterality: N/A;  . CYSTOSCOPY  05/13/2015   Procedure: CYSTOSCOPY;  Surgeon: Brayton Mars, MD;  Location: [facility];  Service: Gynecology;;  . dental implant    . DILATION AND CURETTAGE OF UTERUS     X2   . EYE SURGERY Bilateral    cataract extractions  . JOINT REPLACEMENT     RT HIP  2013  (PARTIAL)   . LAPAROSCOPY  2005   bowel obstruction  . PARATHYROIDECTOMY Left 08/25/2012   Procedure: PARATHYROIDECTOMY;  Surgeon: Earnstine Regal, MD;  [facility]: [facility];  Service: General;  Laterality: Left;  . PITUITARY SURGERY     FOR TUMOR 3 YRS   . REFRACTIVE SURGERY    . TOTAL KNEE ARTHROPLASTY Right 07/18/2014   Procedure: TOTAL KNEE ARTHROPLASTY;  Surgeon: Dereck Leep, MD;  Location: [facility];  Service: Orthopedics;  Laterality:  Right;  Marland Kitchen VAGINAL HYSTERECTOMY Bilateral 05/13/2015   Procedure: HYSTERECTOMY VAGINAL WITH BILATERAL SALPINGO OOPHERECTOMY;  Surgeon: Brayton Mars, MD;  Location: [facility];  Service: Gynecology;  Laterality: Bilateral;  . VAGINAL HYSTERECTOMY     bso ant repair    Family History  Problem Relation Age of Onset  . Glaucoma Mother   . Hypertension Mother   . Thyroid disease Mother   . Stroke Father   . Hypertension Father   . Bipolar disorder Sister   . Breast cancer Paternal Aunt        60's  . Cancer Neg Hx   . Diabetes Neg Hx     Social History Social History   Tobacco Use  . Smoking status: Former Smoker    Packs/day: 1.00    Years: 35.00    Pack years: 35.00     Types: Cigarettes    Last attempt to quit: 02/10/2008    Years since quitting: 10.4  . Smokeless tobacco: Never Used  Substance Use Topics  . Alcohol use: Yes    Alcohol/week: 0.0 standard drinks    Comment: rare, like holidays  . Drug use: No    Allergies  Allergen Reactions  . Citalopram Hives  . Sulfamethoxazole-Trimethoprim Other (See Comments)    GI Upset  . Tape Itching    Paper tape is okay    Current Outpatient Medications  Medication Sig Dispense Refill  . acetaminophen (TYLENOL) 500 MG tablet Take 1,000 mg by mouth every 6 (six) hours as needed for moderate pain, fever or headache.     . Alpha-Lipoic Acid 600 MG CAPS Take 600 mg by mouth daily.     Marland Kitchen amLODipine (NORVASC) 5 MG tablet Take 1 tablet (5 mg total) by mouth daily. 90 tablet 1  . aspirin EC 81 MG tablet Take 81 mg by mouth 4 (four) times a week.     Marland Kitchen atorvastatin (LIPITOR) 40 MG tablet TAKE ONE TABLET EVERY DAY 90 tablet 1  . Biotin w/ Vitamins C & E (HAIR/SKIN/NAILS PO) Take 1 tablet by mouth daily.    . chlorthalidone (HYGROTON) 25 MG tablet TAKE ONE TABLET EVERY DAY (Patient taking differently: Take 25 mg by mouth daily. ) 90 tablet 1  . cholecalciferol (VITAMIN D) 1000 UNITS tablet Take 1,000 Units by mouth daily.     Marland Kitchen docusate sodium (COLACE) 100 MG capsule Take 100 mg by mouth daily.     . ferrous sulfate 325 (65 FE) MG tablet Take 325 mg by mouth daily.    Marland Kitchen gabapentin (NEURONTIN) 100 MG capsule Take 1 capsule by mouth at bedtime.    . gabapentin (NEURONTIN) 300 MG capsule Take 600 mg by mouth at bedtime.     Marland Kitchen losartan (COZAAR) 100 MG tablet TAKE ONE TABLET EVERY DAY 90 tablet 1  . magnesium oxide (MAG-OX) 400 MG tablet Take 400 mg by mouth daily.    Marland Kitchen MEGARED OMEGA-3 KRILL OIL PO Take 1 capsule by mouth at bedtime.    . pantoprazole (PROTONIX) 40 MG tablet TAKE ONE TABLET EVERY DAY 60 tablet 3  . Polyethyl Glycol-Propyl Glycol (SYSTANE) 0.4-0.3 % SOLN Place 1 drop into both eyes 2 (two) times  daily as needed (for dry eyes).    . potassium chloride (K-DUR) 10 MEQ tablet TAKE ONE TABLET BY MOUTH TWICE DAILY 180 tablet 0  . Probiotic Product (FLORAJEN3 PO) Take 1 capsule by mouth daily.     Marland Kitchen senna-docusate (SENOKOT-S) 8.6-50 MG tablet Take 1 tablet  by mouth at bedtime as needed for mild constipation.     No current facility-administered medications for this visit.     Review of Systems Review of Systems  Constitutional: Negative.   Respiratory: Negative.   Cardiovascular: Negative.     Blood pressure 124/78, pulse 78, temperature (!) 97.3 F (36.3 C), temperature source Skin, height 5\' 6"  (1.676 m), weight 140 lb (63.5 kg), SpO2 98 %.  Physical Exam Physical Exam Exam conducted with a chaperone present.  Chest:       Data Reviewed December 17, 2017 pathology: A. BREAST, RIGHT 9-11:00; LUMPECTOMY:  - EXTENSIVE RUPTURED DUCT ECTASIA WITH INFLAMMATION AND AREAS OF  SQUAMOUS METAPLASIA.  - FAT NECROSIS.  - SCLEROSING ADENOSIS WITH MICROCALCIFICATION.  - USUAL DUCTAL HYPERPLASIA.  - FIBROADENOMATOUS CHANGE.  - NEGATIVE FOR ATYPIA AND MALIGNANCY.   Assessment Doing well post excision of chronic area of inflammation in the right breast.  Plan  The patient may resume her annual screening mammograms with her PCP.  These are due in August, 2020.  She is welcome return if she has any new questions regarding her own self-exam or if new mammographic abnormalities are appreciated.   HPI, Physical Exam, Assessment and Plan have been scribed under the direction and in the presence of Hervey Ard, MD.  Gaspar Cola, CMA I have completed the exam and reviewed the above documentation for accuracy and completeness.  I agree with the above.  Haematologist has been used and any errors in dictation or transcription are unintentional.  Hervey Ard, M.D., F.A.C.S.  Forest Gleason Pollyanna Levay 07/20/2018, 11:17 AM

## 2018-07-19 NOTE — Patient Instructions (Signed)
Return as needed.The patient is aware to call back for any questions or concerns.  

## 2018-07-28 ENCOUNTER — Other Ambulatory Visit: Payer: Self-pay | Admitting: Internal Medicine

## 2018-08-05 ENCOUNTER — Other Ambulatory Visit: Payer: Self-pay

## 2018-08-05 ENCOUNTER — Ambulatory Visit (INDEPENDENT_AMBULATORY_CARE_PROVIDER_SITE_OTHER): Payer: Medicare Other | Admitting: Internal Medicine

## 2018-08-05 ENCOUNTER — Encounter: Payer: Self-pay | Admitting: Internal Medicine

## 2018-08-05 DIAGNOSIS — G629 Polyneuropathy, unspecified: Secondary | ICD-10-CM

## 2018-08-05 DIAGNOSIS — E7800 Pure hypercholesterolemia, unspecified: Secondary | ICD-10-CM

## 2018-08-05 DIAGNOSIS — J449 Chronic obstructive pulmonary disease, unspecified: Secondary | ICD-10-CM

## 2018-08-05 DIAGNOSIS — F32A Depression, unspecified: Secondary | ICD-10-CM

## 2018-08-05 DIAGNOSIS — F419 Anxiety disorder, unspecified: Secondary | ICD-10-CM

## 2018-08-05 DIAGNOSIS — F329 Major depressive disorder, single episode, unspecified: Secondary | ICD-10-CM | POA: Diagnosis not present

## 2018-08-05 DIAGNOSIS — D352 Benign neoplasm of pituitary gland: Secondary | ICD-10-CM

## 2018-08-05 DIAGNOSIS — D649 Anemia, unspecified: Secondary | ICD-10-CM

## 2018-08-05 DIAGNOSIS — E210 Primary hyperparathyroidism: Secondary | ICD-10-CM

## 2018-08-05 DIAGNOSIS — I10 Essential (primary) hypertension: Secondary | ICD-10-CM

## 2018-08-05 NOTE — Progress Notes (Signed)
Patient ID: [name], female   DOB: 09-28-1945, 73 y.o.   MRN: [id number]   Virtual Visit via telephone Note  This visit type was conducted due to national recommendations for restrictions regarding the COVID-19 pandemic (e.g. social distancing).  This format is felt to be most appropriate for this patient at this time.  All issues noted in this document were discussed and addressed.  No physical exam was performed (except for noted visual exam findings with Video Visits).   I connected with [name] by telephone and verified that I am speaking with the correct person using two identifiers. Location patient: home Location provider: work  Persons participating in the telephone visit: patient, provider  I discussed the limitations, risks, security and privacy concerns of performing an evaluation and management service by telephone and the availability of in person appointments. The patient expressed understanding and agreed to proceed.   Reason for visit: scheduled follow up.   HPI: Saw Dr Bary Castilla and is s/p excisional biopsy left breast.  Recommended f/u annual screening in 09/2018.  Seeing neurology.  Has peripheral neuropathy.  Still has issues with feeling off balance. Still with numbness and tingling - both hands.  Has f/u with neurology 08/18/18.  Also has pituitary macroadenoma.  Discussed further w/up and evaluation.  Agreeable to endocrinology referral.  Tries to stay active.  No chest pain.  No sob.  No acid reflux.  No abdominal pain.  Bowels moving.  Blood pressure doing well.  Increased stress and anxiety.  Discussed with her.  She is agreeable to counseling.     ROS: See pertinent positives and negatives per HPI.  Past Medical History:  Diagnosis Date  . Anemia    takes iron supplements  . Anxiety   . Arthritis   . Breast mass, right 12/2017  . Burning feet syndrome   . Chronic insomnia   . Complication of anesthesia    "AFFECTS MY MEMORY"  . COPD (chronic  obstructive pulmonary disease) ([place])    documented in outside records. patient denies this diagnosis  . Depression   . Dysrhythmia    PALPITATIONS when anxious  . Edema    MILD ANKLE  . GERD (gastroesophageal reflux disease)   . Heart murmur    current small sign of heart murmur  . History of stomach ulcers   . HOH (hard of hearing)    AIDS but doesn't always wear them  . Hypercholesterolemia   . Hyperparathyroidism, primary ([place])   . Hypertension   . Neuropathy    arms, feet, legs, neck  . Osteoporosis   . Palpitations   . Scoliosis   . Shortness of breath    with exertion  . Tingling    both hands    Past Surgical History:  Procedure Laterality Date  . ANTERIOR CERVICAL DECOMP/DISCECTOMY FUSION  10/28/2011   Procedure: ANTERIOR CERVICAL DECOMPRESSION/DISCECTOMY FUSION 2 LEVELS;  Surgeon: Elaina Hoops, MD;  Location: [facility];  Service: Neurosurgery;  Laterality: N/A;  . BREAST BIOPSY Right 1992   neg  . BREAST BIOPSY Right 12/17/2017   Procedure: BREAST BIOPSY;  Surgeon: Robert Bellow, MD;  Location: [facility];  Service: General;  Laterality: Right;  . BREAST SURGERY     RT BREAST CYST REMOVED 15 YRS AGO   . CATARACT EXTRACTION W/PHACO Right 10/30/2014   Procedure: CATARACT EXTRACTION PHACO AND INTRAOCULAR LENS PLACEMENT (IOC);  Surgeon: Birder Robson, MD;  Location: [facility];  Service: Ophthalmology;  Laterality: Right;  US0:40 AP18.2 CDE7.42  [place] lot #[id number]  . CATARACT EXTRACTION W/PHACO Left 11/27/2014   Procedure: CATARACT EXTRACTION PHACO AND INTRAOCULAR LENS PLACEMENT (IOC);  Surgeon: Birder Robson, MD;  Location: [facility];  Service: Ophthalmology;  Laterality: Left;  [place] 00:36.9 AP%: 18.9 CDE: 6.96  [place] lot #  [id number]  . CYSTOCELE REPAIR N/A 05/13/2015   Procedure: ANTERIOR REPAIR (CYSTOCELE);  Surgeon: Brayton Mars, MD;  Location: [facility];  Service: Gynecology;  Laterality: N/A;  . CYSTOSCOPY  05/13/2015   Procedure: CYSTOSCOPY;   Surgeon: Brayton Mars, MD;  Location: [facility];  Service: Gynecology;;  . dental implant    . DILATION AND CURETTAGE OF UTERUS     X2   . EYE SURGERY Bilateral    cataract extractions  . JOINT REPLACEMENT     RT HIP  2013  (PARTIAL)   . LAPAROSCOPY  2005   bowel obstruction  . PARATHYROIDECTOMY Left 08/25/2012   Procedure: PARATHYROIDECTOMY;  Surgeon: Earnstine Regal, MD;  Location: [facility];  Service: General;  Laterality: Left;  . PITUITARY SURGERY     FOR TUMOR 3 YRS   . REFRACTIVE SURGERY    . TOTAL KNEE ARTHROPLASTY Right 07/18/2014   Procedure: TOTAL KNEE ARTHROPLASTY;  Surgeon: Dereck Leep, MD;  Location: [facility];  Service: Orthopedics;  Laterality: Right;  Marland Kitchen VAGINAL HYSTERECTOMY Bilateral 05/13/2015   Procedure: HYSTERECTOMY VAGINAL WITH BILATERAL SALPINGO OOPHERECTOMY;  Surgeon: Brayton Mars, MD;  Location: [facility];  Service: Gynecology;  Laterality: Bilateral;  . VAGINAL HYSTERECTOMY     bso ant repair    Family History  Problem Relation Age of Onset  . Glaucoma Mother   . Hypertension Mother   . Thyroid disease Mother   . Stroke Father   . Hypertension Father   . Bipolar disorder Sister   . Breast cancer Paternal Aunt        60's  . Cancer Neg Hx   . Diabetes Neg Hx     SOCIAL HX: reviewed.    Current Outpatient Medications:  .  acetaminophen (TYLENOL) 500 MG tablet, Take 1,000 mg by mouth every 6 (six) hours as needed for moderate pain, fever or headache. , Disp: , Rfl:  .  Alpha-Lipoic Acid 600 MG CAPS, Take 600 mg by mouth daily. , Disp: , Rfl:  .  amLODipine (NORVASC) 5 MG tablet, Take 1 tablet (5 mg total) by mouth daily., Disp: 90 tablet, Rfl: 1 .  aspirin EC 81 MG tablet, Take 81 mg by mouth 4 (four) times a week. , Disp: , Rfl:  .  atorvastatin (LIPITOR) 40 MG tablet, TAKE ONE TABLET EVERY DAY, Disp: 90 tablet, Rfl: 1 .  Biotin w/ Vitamins C & E (HAIR/SKIN/NAILS PO), Take 1 tablet by mouth daily., Disp: , Rfl:  .  chlorthalidone (HYGROTON)  25 MG tablet, TAKE ONE TABLET EVERY DAY, Disp: 90 tablet, Rfl: 1 .  cholecalciferol (VITAMIN D) 1000 UNITS tablet, Take 1,000 Units by mouth daily. , Disp: , Rfl:  .  docusate sodium (COLACE) 100 MG capsule, Take 100 mg by mouth daily. , Disp: , Rfl:  .  ferrous sulfate 325 (65 FE) MG tablet, Take 325 mg by mouth daily., Disp: , Rfl:  .  gabapentin (NEURONTIN) 100 MG capsule, Take 1 capsule by mouth at bedtime., Disp: , Rfl:  .  gabapentin (NEURONTIN) 300 MG capsule, Take 600 mg by mouth at bedtime. , Disp: , Rfl:  .  losartan (COZAAR) 100 MG tablet,  TAKE ONE TABLET EVERY DAY, Disp: 90 tablet, Rfl: 1 .  magnesium oxide (MAG-OX) 400 MG tablet, Take 400 mg by mouth daily., Disp: , Rfl:  .  MEGARED OMEGA-3 KRILL OIL PO, Take 1 capsule by mouth at bedtime., Disp: , Rfl:  .  pantoprazole (PROTONIX) 40 MG tablet, TAKE ONE TABLET EVERY DAY, Disp: 60 tablet, Rfl: 3 .  Polyethyl Glycol-Propyl Glycol (SYSTANE) 0.4-0.3 % SOLN, Place 1 drop into both eyes 2 (two) times daily as needed (for dry eyes)., Disp: , Rfl:  .  potassium chloride (K-DUR) 10 MEQ tablet, TAKE ONE TABLET BY MOUTH TWICE DAILY, Disp: 180 tablet, Rfl: 0 .  Probiotic Product (FLORAJEN3 PO), Take 1 capsule by mouth daily. , Disp: , Rfl:  .  senna-docusate (SENOKOT-S) 8.6-50 MG tablet, Take 1 tablet by mouth at bedtime as needed for mild constipation., Disp: , Rfl:   EXAM:  GENERAL: alert.  Sounds to be in no acute distress.  Answering questions appropriately.    PSYCH/NEURO: pleasant and cooperative, no obvious depression or anxiety, speech and thought processing grossly intact  ASSESSMENT AND PLAN:  Discussed the following assessment and plan:  Anemia Follow cbc.   Anxiety Increased stress and anxiety.  Has been given information for counselor.  She plans to call to schedule an appt.    COPD (chronic obstructive pulmonary disease) ([facility]) Breathing stable.    Depression No suicidal ideations.  Discussed with her today.  She is  agreeable to see a Social worker.  Follow.    Hypercholesterolemia On lipitor.  Low cholesterol diet and exercise.  Follow lipid panel and liver function tests.    Hyperparathyroidism, primary S/p surgery.  Follow calcium.    Hypertension Blood pressure has been under good control.  Continue current medication regimen.  Follow pressures.  Follow metabolic panel.    Neuropathy On gabapentin.  Seeing neurology.  Some unsteadiness.  Discussed PT.  Has f/u planned with neurology.    Pituitary adenoma ([place]) Noted on MRI.  Recent MRI stable.  Discussed endocrinology evaluation.      I discussed the assessment and treatment plan with the patient. The patient was provided an opportunity to ask questions and all were answered. The patient agreed with the plan and demonstrated an understanding of the instructions.   The patient was advised to call back or seek an in-person evaluation if the symptoms worsen or if the condition fails to improve as anticipated.  I provided 22 minutes of non-face-to-face time during this encounter.   Einar Pheasant, MD

## 2018-08-07 ENCOUNTER — Encounter: Payer: Self-pay | Admitting: Internal Medicine

## 2018-08-07 NOTE — Assessment & Plan Note (Signed)
On lipitor.  Low cholesterol diet and exercise.  Follow lipid panel and liver function tests.   

## 2018-08-07 NOTE — Assessment & Plan Note (Signed)
Noted on MRI.  Recent MRI stable.  Discussed endocrinology evaluation.

## 2018-08-07 NOTE — Assessment & Plan Note (Signed)
Follow cbc.  

## 2018-08-07 NOTE — Assessment & Plan Note (Signed)
S/p surgery.  Follow calcium.

## 2018-08-07 NOTE — Assessment & Plan Note (Signed)
Breathing stable.

## 2018-08-07 NOTE — Assessment & Plan Note (Signed)
Increased stress and anxiety.  Has been given information for counselor.  She plans to call to schedule an appt.

## 2018-08-07 NOTE — Assessment & Plan Note (Signed)
On gabapentin.  Seeing neurology.  Some unsteadiness.  Discussed PT.  Has f/u planned with neurology.

## 2018-08-07 NOTE — Assessment & Plan Note (Signed)
No suicidal ideations.  Discussed with her today.  She is agreeable to see a Social worker.  Follow.

## 2018-08-07 NOTE — Assessment & Plan Note (Signed)
Blood pressure has been under good control.  Continue current medication regimen.  Follow pressures.  Follow metabolic panel.  

## 2018-08-15 ENCOUNTER — Other Ambulatory Visit: Payer: Self-pay | Admitting: Internal Medicine

## 2018-08-15 DIAGNOSIS — Z1231 Encounter for screening mammogram for malignant neoplasm of breast: Secondary | ICD-10-CM

## 2018-08-15 DIAGNOSIS — M545 Low back pain: Secondary | ICD-10-CM | POA: Diagnosis not present

## 2018-08-18 DIAGNOSIS — G608 Other hereditary and idiopathic neuropathies: Secondary | ICD-10-CM | POA: Diagnosis not present

## 2018-08-18 DIAGNOSIS — D352 Benign neoplasm of pituitary gland: Secondary | ICD-10-CM | POA: Diagnosis not present

## 2018-08-18 DIAGNOSIS — R200 Anesthesia of skin: Secondary | ICD-10-CM | POA: Diagnosis not present

## 2018-08-18 DIAGNOSIS — M545 Low back pain: Secondary | ICD-10-CM | POA: Diagnosis not present

## 2018-08-19 DIAGNOSIS — M545 Low back pain: Secondary | ICD-10-CM | POA: Diagnosis not present

## 2018-08-23 DIAGNOSIS — M545 Low back pain: Secondary | ICD-10-CM | POA: Diagnosis not present

## 2018-08-26 DIAGNOSIS — M545 Low back pain: Secondary | ICD-10-CM | POA: Diagnosis not present

## 2018-08-30 DIAGNOSIS — M545 Low back pain: Secondary | ICD-10-CM | POA: Diagnosis not present

## 2018-09-01 ENCOUNTER — Ambulatory Visit (INDEPENDENT_AMBULATORY_CARE_PROVIDER_SITE_OTHER): Payer: Medicare Other | Admitting: Psychology

## 2018-09-01 DIAGNOSIS — F3289 Other specified depressive episodes: Secondary | ICD-10-CM | POA: Diagnosis not present

## 2018-09-02 DIAGNOSIS — M545 Low back pain: Secondary | ICD-10-CM | POA: Diagnosis not present

## 2018-09-06 DIAGNOSIS — M545 Low back pain: Secondary | ICD-10-CM | POA: Diagnosis not present

## 2018-09-07 ENCOUNTER — Other Ambulatory Visit: Payer: Self-pay | Admitting: Internal Medicine

## 2018-09-07 DIAGNOSIS — D352 Benign neoplasm of pituitary gland: Secondary | ICD-10-CM | POA: Diagnosis not present

## 2018-09-07 DIAGNOSIS — R7989 Other specified abnormal findings of blood chemistry: Secondary | ICD-10-CM | POA: Diagnosis not present

## 2018-09-08 ENCOUNTER — Other Ambulatory Visit: Payer: Self-pay | Admitting: Internal Medicine

## 2018-09-09 DIAGNOSIS — M545 Low back pain: Secondary | ICD-10-CM | POA: Diagnosis not present

## 2018-09-13 ENCOUNTER — Ambulatory Visit (INDEPENDENT_AMBULATORY_CARE_PROVIDER_SITE_OTHER): Payer: Medicare Other | Admitting: Psychology

## 2018-09-13 DIAGNOSIS — F3289 Other specified depressive episodes: Secondary | ICD-10-CM | POA: Diagnosis not present

## 2018-09-15 DIAGNOSIS — R200 Anesthesia of skin: Secondary | ICD-10-CM | POA: Diagnosis not present

## 2018-09-16 DIAGNOSIS — D2262 Melanocytic nevi of left upper limb, including shoulder: Secondary | ICD-10-CM | POA: Diagnosis not present

## 2018-09-16 DIAGNOSIS — D2261 Melanocytic nevi of right upper limb, including shoulder: Secondary | ICD-10-CM | POA: Diagnosis not present

## 2018-09-16 DIAGNOSIS — D225 Melanocytic nevi of trunk: Secondary | ICD-10-CM | POA: Diagnosis not present

## 2018-09-16 DIAGNOSIS — D2271 Melanocytic nevi of right lower limb, including hip: Secondary | ICD-10-CM | POA: Diagnosis not present

## 2018-09-20 ENCOUNTER — Ambulatory Visit
Admission: RE | Admit: 2018-09-20 | Discharge: 2018-09-20 | Disposition: A | Discharge status: Home / Self Care | Payer: Medicare Other | Source: Ambulatory Visit | Attending: Internal Medicine | Admitting: Internal Medicine

## 2018-09-20 ENCOUNTER — Other Ambulatory Visit: Payer: Self-pay

## 2018-09-20 DIAGNOSIS — Z1231 Encounter for screening mammogram for malignant neoplasm of breast: Secondary | ICD-10-CM | POA: Insufficient documentation

## 2018-09-21 ENCOUNTER — Other Ambulatory Visit: Payer: Self-pay | Admitting: Internal Medicine

## 2018-09-21 DIAGNOSIS — R928 Other abnormal and inconclusive findings on diagnostic imaging of breast: Secondary | ICD-10-CM

## 2018-09-21 NOTE — Progress Notes (Signed)
Orders placed for f/u right breast mammogram and ultrasound.   

## 2018-09-22 DIAGNOSIS — R7989 Other specified abnormal findings of blood chemistry: Secondary | ICD-10-CM | POA: Diagnosis not present

## 2018-09-28 ENCOUNTER — Ambulatory Visit (INDEPENDENT_AMBULATORY_CARE_PROVIDER_SITE_OTHER): Payer: Medicare Other | Admitting: Psychology

## 2018-09-28 DIAGNOSIS — F3289 Other specified depressive episodes: Secondary | ICD-10-CM | POA: Diagnosis not present

## 2018-10-04 ENCOUNTER — Ambulatory Visit
Admission: RE | Admit: 2018-10-04 | Discharge: 2018-10-04 | Disposition: A | Discharge status: Home / Self Care | Payer: Medicare Other | Source: Ambulatory Visit | Attending: Internal Medicine | Admitting: Internal Medicine

## 2018-10-04 DIAGNOSIS — R928 Other abnormal and inconclusive findings on diagnostic imaging of breast: Secondary | ICD-10-CM | POA: Insufficient documentation

## 2018-10-04 DIAGNOSIS — R922 Inconclusive mammogram: Secondary | ICD-10-CM | POA: Diagnosis not present

## 2018-10-15 ENCOUNTER — Other Ambulatory Visit: Payer: Self-pay | Admitting: Internal Medicine

## 2018-11-02 ENCOUNTER — Ambulatory Visit (INDEPENDENT_AMBULATORY_CARE_PROVIDER_SITE_OTHER): Payer: Medicare Other | Admitting: Psychology

## 2018-11-02 DIAGNOSIS — F3289 Other specified depressive episodes: Secondary | ICD-10-CM

## 2018-11-18 ENCOUNTER — Ambulatory Visit (INDEPENDENT_AMBULATORY_CARE_PROVIDER_SITE_OTHER): Payer: Medicare Other | Admitting: Psychology

## 2018-11-18 DIAGNOSIS — F3289 Other specified depressive episodes: Secondary | ICD-10-CM | POA: Diagnosis not present

## 2018-12-01 ENCOUNTER — Ambulatory Visit (INDEPENDENT_AMBULATORY_CARE_PROVIDER_SITE_OTHER): Payer: Medicare Other | Admitting: Psychology

## 2018-12-01 DIAGNOSIS — F3289 Other specified depressive episodes: Secondary | ICD-10-CM

## 2018-12-13 ENCOUNTER — Other Ambulatory Visit: Payer: Self-pay | Admitting: Internal Medicine

## 2018-12-13 MED ORDER — PANTOPRAZOLE SODIUM 40 MG PO TBEC: 40.0000 mg | DELAYED_RELEASE_TABLET | Freq: Every day | ORAL | 3 refills | Status: DC

## 2018-12-13 NOTE — Telephone Encounter (Signed)
Medication Refill - Medication: pantoprazole (PROTONIX) 40 MG tablet    Has the patient contacted their pharmacy? Yes.   Pt states she only has one more pill left. Please advise.  (Agent: If no, request that the patient contact the pharmacy for the refill.) (Agent: If yes, when and what did the pharmacy advise?)  Preferred Pharmacy (with phone number or street name):  [place], [place] - [address]  [place] [address]  Phone: [phone] Fax: [phone]  Not a 24 hour pharmacy; exact hours not known.     Agent: Please be advised that RX refills may take up to 3 business days. We ask that you follow-up with your pharmacy.

## 2018-12-21 ENCOUNTER — Telehealth: Payer: Self-pay | Admitting: Internal Medicine

## 2018-12-21 NOTE — Telephone Encounter (Signed)
Copied from [place] [id number]. Topic: General - Other >> Dec 21, 2018 11:49 AM Keene Breath wrote: Reason for CRM: Patient called to ask the nurse to call her regarding her medications and her insurance company.  She believes she needs to give some information from the doctor to [employer], but is a little confused at exactly what they need.  CB# [phone].  Patient would also like to talk to nurse about her shingles shot.

## 2018-12-22 NOTE — Telephone Encounter (Signed)
LMTCB

## 2018-12-26 DIAGNOSIS — B351 Tinea unguium: Secondary | ICD-10-CM | POA: Diagnosis not present

## 2018-12-26 DIAGNOSIS — M79674 Pain in right toe(s): Secondary | ICD-10-CM | POA: Diagnosis not present

## 2018-12-26 DIAGNOSIS — M79675 Pain in left toe(s): Secondary | ICD-10-CM | POA: Diagnosis not present

## 2018-12-29 ENCOUNTER — Ambulatory Visit (INDEPENDENT_AMBULATORY_CARE_PROVIDER_SITE_OTHER): Payer: Medicare Other | Admitting: Psychology

## 2018-12-29 DIAGNOSIS — F3289 Other specified depressive episodes: Secondary | ICD-10-CM | POA: Diagnosis not present

## 2018-12-30 ENCOUNTER — Other Ambulatory Visit: Payer: Self-pay

## 2018-12-30 ENCOUNTER — Ambulatory Visit (INDEPENDENT_AMBULATORY_CARE_PROVIDER_SITE_OTHER): Payer: Medicare Other | Admitting: Internal Medicine

## 2018-12-30 VITALS — BP 128/68 | HR 61 | Temp 96.1°F | Resp 16 | Wt 144.2 lb

## 2018-12-30 DIAGNOSIS — M25561 Pain in right knee: Secondary | ICD-10-CM | POA: Diagnosis not present

## 2018-12-30 DIAGNOSIS — R238 Other skin changes: Secondary | ICD-10-CM

## 2018-12-30 DIAGNOSIS — D352 Benign neoplasm of pituitary gland: Secondary | ICD-10-CM | POA: Diagnosis not present

## 2018-12-30 DIAGNOSIS — I83813 Varicose veins of bilateral lower extremities with pain: Secondary | ICD-10-CM

## 2018-12-30 DIAGNOSIS — N644 Mastodynia: Secondary | ICD-10-CM

## 2018-12-30 DIAGNOSIS — I10 Essential (primary) hypertension: Secondary | ICD-10-CM | POA: Diagnosis not present

## 2018-12-30 MED ORDER — CEPHALEXIN 500 MG PO CAPS: 500.0000 mg | ORAL_CAPSULE | Freq: Three times a day (TID) | ORAL | 0 refills | Status: DC

## 2018-12-30 NOTE — Progress Notes (Signed)
Patient ID: [name], female   DOB: 1945/05/12, 73 y.o.   MRN: [id number]   Subjective:    Patient ID: [name], female    DOB: 1945/08/09, 73 y.o.   MRN: [id number]  HPI This visit occurred during the SARS-CoV-2 public health emergency.  Safety protocols were in place, including screening questions prior to the visit, additional usage of staff PPE, and extensive cleaning of exam room while observing appropriate contact time as indicated for disinfecting solutions.  Patient here as a work in appt. She is s/p excisional biopsy right breast.  States recently started having some soreness and has noticed some redness in the right breast.  States may be a little better today.  No nipple discharge.  No fever. No chest pain or sob.  No increased cough or congestion.  She has noticed some irritation - corner of right eye (adjacent to her eye).  Was questioning if had return of shingles.  No rash.  No redness in the eye. No eye pain.  States is better today.  No eye drainage.  No sinus pressure.  No acid reflux.  No abdominal pain or cramping.  Some right knee discomfort at times.  Also reports left ankle - vein - lesion. States has been present "for a while". No redness. No evidence of phlebitis.  No increased warmth.  Taking lipitor.  Blood pressure doing well.     Past Medical History:  Diagnosis Date   Anemia    takes iron supplements   Anxiety    Arthritis    Breast mass, right 12/2017   Burning feet syndrome    Chronic insomnia    Complication of anesthesia    "AFFECTS MY MEMORY"   COPD (chronic obstructive pulmonary disease) ([place])    documented in outside records. patient denies this diagnosis   Depression    Dysrhythmia    PALPITATIONS when anxious   Edema    MILD ANKLE   GERD (gastroesophageal reflux disease)    Heart murmur    current small sign of heart murmur   History of stomach ulcers    HOH (hard of hearing)    AIDS but doesn't always wear them    Hypercholesterolemia    Hyperparathyroidism, primary ([place])    Hypertension    Neuropathy    arms, feet, legs, neck   Osteoporosis    Palpitations    Scoliosis    Shortness of breath    with exertion   Tingling    both hands   Past Surgical History:  Procedure Laterality Date   ANTERIOR CERVICAL DECOMP/DISCECTOMY FUSION  10/28/2011   Procedure: ANTERIOR CERVICAL DECOMPRESSION/DISCECTOMY FUSION 2 LEVELS;  Surgeon: Elaina Hoops, MD;  Location: [facility];  Service: Neurosurgery;  Laterality: N/A;   BREAST BIOPSY Right 12/17/2017   Procedure: BREAST BIOPSY;  Surgeon: Robert Bellow, MD;  Location: [facility];  Service: General;  Laterality: Right;   BREAST EXCISIONAL BIOPSY Right 1992   neg   BREAST LUMPECTOMY Right 12/2017   Benign   BREAST SURGERY     RT BREAST CYST REMOVED 15 YRS AGO    CATARACT EXTRACTION W/PHACO Right 10/30/2014   Procedure: CATARACT EXTRACTION PHACO AND INTRAOCULAR LENS PLACEMENT ([place]);  Surgeon: Birder Robson, MD;  Location: [facility];  Service: Ophthalmology;  Laterality: Right;  US0:40 AP18.2 CDE7.97  [place] [id number]   CATARACT EXTRACTION W/PHACO Left 11/27/2014   Procedure: CATARACT EXTRACTION PHACO AND INTRAOCULAR LENS PLACEMENT ([facility]);  Surgeon: Birder Robson,  MD;  Location: [facility];  Service: Ophthalmology;  Laterality: Left;  [place] 00:36.9 AP%: 18.9 CDE: 6.96  [place] #  [id number]   CYSTOCELE REPAIR N/A 05/13/2015   Procedure: ANTERIOR REPAIR (CYSTOCELE);  Surgeon: Brayton Mars, MD;  Location: [facility];  Service: Gynecology;  Laterality: N/A;   CYSTOSCOPY  05/13/2015   Procedure: CYSTOSCOPY;  Surgeon: Brayton Mars, MD;  Location: [facility];  Service: Gynecology;;   dental implant     DILATION AND CURETTAGE OF UTERUS     X2    EYE SURGERY Bilateral    cataract extractions   JOINT REPLACEMENT     RT HIP  2013  (PARTIAL)    LAPAROSCOPY  2005   bowel obstruction   PARATHYROIDECTOMY Left 08/25/2012    Procedure: PARATHYROIDECTOMY;  Surgeon: Earnstine Regal, MD;  Location: [facility];  Service: General;  Laterality: Left;   PITUITARY SURGERY     FOR TUMOR 3 YRS    REFRACTIVE SURGERY     TOTAL KNEE ARTHROPLASTY Right 07/18/2014   Procedure: TOTAL KNEE ARTHROPLASTY;  Surgeon: Dereck Leep, MD;  Location: [facility];  Service: Orthopedics;  Laterality: Right;   VAGINAL HYSTERECTOMY Bilateral 05/13/2015   Procedure: HYSTERECTOMY VAGINAL WITH BILATERAL SALPINGO OOPHERECTOMY;  Surgeon: Brayton Mars, MD;  Location: [facility];  Service: Gynecology;  Laterality: Bilateral;   VAGINAL HYSTERECTOMY     bso ant repair   Family History  Problem Relation Age of Onset   Glaucoma Mother    Hypertension Mother    Thyroid disease Mother    Stroke Father    Hypertension Father    Bipolar disorder Sister    Breast cancer Paternal Aunt        56's   Cancer Neg Hx    Diabetes Neg Hx    Social History   Socioeconomic History   Marital status: Divorced    Spouse name: Not on file   Number of children: 1   Years of education: Not on file   Highest education level: Some college, no degree  Occupational History   Occupation: Sales promotion account executive, 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    Employer: [employer]    Comment: retired  Scientist, product/process development strain: Hard   Food insecurity    Worry: Often true    Inability: Often true   Transportation needs    Medical: No    Non-medical: No  Tobacco Use   Smoking status: Former Smoker    Packs/day: 1.00    Years: 35.00    Pack years: 35.00    Types: Cigarettes    Quit date: 02/10/2008    Years since quitting: 10.8   Smokeless tobacco: Never Used  Substance and Sexual Activity   Alcohol use: Yes    Alcohol/week: 0.0 standard drinks    Comment: rare, like holidays   Drug use: No   Sexual activity: Never    Birth control/protection: Post-menopausal  Lifestyle   Physical activity    Days per week: 0 days    Minutes  per session: 0 min   Stress: Rather much  Relationships   Social connections    Talks on phone: Not on file    Gets together: Not on file    Attends religious service: Never    Active member of club or organization: No    Attends meetings of clubs or organizations: Never    Relationship status: Divorced  Other Topics Concern   Not on file  Social History Narrative   Not on file    Outpatient Encounter Medications as of 12/30/2018  Medication Sig   acetaminophen (TYLENOL) 500 MG tablet Take 1,000 mg by mouth every 6 (six) hours as needed for moderate pain, fever or headache.    Alpha-Lipoic Acid 600 MG CAPS Take 600 mg by mouth daily.    amLODipine (NORVASC) 5 MG tablet Take 1 tablet (5 mg total) by mouth daily.   aspirin EC 81 MG tablet Take 81 mg by mouth 4 (four) times a week.    atorvastatin (LIPITOR) 40 MG tablet TAKE ONE TABLET BY MOUTH EVERY DAY   Biotin w/ Vitamins C & E (HAIR/SKIN/NAILS PO) Take 1 tablet by mouth daily.   cephALEXin (KEFLEX) 500 MG capsule Take 1 capsule (500 mg total) by mouth 3 (three) times daily.   chlorthalidone (HYGROTON) 25 MG tablet TAKE ONE TABLET EVERY DAY   cholecalciferol (VITAMIN D) 1000 UNITS tablet Take 1,000 Units by mouth daily.    docusate sodium (COLACE) 100 MG capsule Take 100 mg by mouth daily.    ferrous sulfate 325 (65 FE) MG tablet Take 325 mg by mouth daily.   gabapentin (NEURONTIN) 300 MG capsule Take 600 mg by mouth at bedtime.    losartan (COZAAR) 100 MG tablet TAKE ONE TABLET BY MOUTH EVERY DAY   magnesium oxide (MAG-OX) 400 MG tablet Take 400 mg by mouth daily.   MEGARED OMEGA-3 KRILL OIL PO Take 1 capsule by mouth at bedtime.   pantoprazole (PROTONIX) 40 MG tablet Take 1 tablet (40 mg total) by mouth daily.   Polyethyl Glycol-Propyl Glycol (SYSTANE) 0.4-0.3 % SOLN Place 1 drop into both eyes 2 (two) times daily as needed (for dry eyes).   potassium chloride (K-DUR) 10 MEQ tablet TAKE ONE TABLET BY MOUTH  TWICE DAILY   Probiotic Product (FLORAJEN3 PO) Take 1 capsule by mouth daily.    senna-docusate (SENOKOT-S) 8.6-50 MG tablet Take 1 tablet by mouth at bedtime as needed for mild constipation.   [DISCONTINUED] gabapentin (NEURONTIN) 100 MG capsule Take 1 capsule by mouth at bedtime.   No facility-administered encounter medications on file as of 12/30/2018.     Review of Systems  Constitutional: Negative for appetite change, fever and unexpected weight change.  HENT: Negative for congestion and sinus pressure.   Eyes: Negative for pain, discharge, redness and itching.  Respiratory: Negative for cough, chest tightness and shortness of breath.   Cardiovascular: Negative for chest pain, palpitations and leg swelling.  Gastrointestinal: Negative for abdominal pain, diarrhea, nausea and vomiting.  Genitourinary: Negative for difficulty urinating and dysuria.  Musculoskeletal: Negative for joint swelling and myalgias.  Skin: Negative for color change.       Erythema - right breast.   Neurological: Negative for dizziness, light-headedness and headaches.  Psychiatric/Behavioral: Negative for agitation and dysphoric mood.       Objective:    Physical Exam Constitutional:      General: She is not in acute distress.    Appearance: Normal appearance.  HENT:     Head: Normocephalic and atraumatic.     Right Ear: External ear normal.     Left Ear: External ear normal.  Eyes:     General: No scleral icterus.       Right eye: No discharge.        Left eye: No discharge.     Conjunctiva/sclera: Conjunctivae normal.  Neck:     Musculoskeletal: Neck supple. No muscular tenderness.  Thyroid: No thyromegaly.  Cardiovascular:     Rate and Rhythm: Normal rate and regular rhythm.  Pulmonary:     Effort: No respiratory distress.     Breath sounds: Normal breath sounds. No wheezing.     Comments: Breasts:  No nipple discharge or nipple retraction present.  Increased palpable firm area -  right breast with overlying erythema.  Minimal tenderness to palpation.  No axillary adenopathy.   Abdominal:     General: Bowel sounds are normal.     Palpations: Abdomen is soft.     Tenderness: There is no abdominal tenderness.  Musculoskeletal:        General: No swelling or tenderness.  Lymphadenopathy:     Cervical: No cervical adenopathy.  Skin:    Comments: No erythema or rash - right eye.  Redness - right breast.    Neurological:     Mental Status: She is alert.  Psychiatric:        Mood and Affect: Mood normal.        Behavior: Behavior normal.     BP 128/68    Pulse 61    Temp (!) 96.1 F (35.6 C)    Resp 16    Wt 144 lb 3.2 oz (65.4 kg)    SpO2 99%    BMI 23.27 kg/m  Wt Readings from Last 3 Encounters:  12/30/18 144 lb 3.2 oz (65.4 kg)  07/19/18 140 lb (63.5 kg)  03/14/18 133 lb 9.6 oz (60.6 kg)     Lab Results  Component Value Date   WBC 5.6 06/28/2018   HGB 15.0 06/28/2018   HCT 44.3 06/28/2018   PLT 274.0 06/28/2018   GLUCOSE 89 06/28/2018   CHOL 196 06/28/2018   TRIG 113.0 06/28/2018   HDL 75.50 06/28/2018   LDLDIRECT 86.0 05/26/2016   LDLCALC 98 06/28/2018   ALT 23 06/28/2018   AST 20 06/28/2018   NA 140 06/28/2018   K 3.6 06/28/2018   CL 99 06/28/2018   CREATININE 0.57 06/28/2018   BUN 15 06/28/2018   CO2 33 (H) 06/28/2018   TSH 1.12 06/28/2018   INR 0.91 07/05/2014   HGBA1C 5.8 05/26/2016    Mm Diag Breast Tomo Uni Right  Result Date: 10/04/2018 CLINICAL DATA:  Two areas of possible asymmetry in the medial right breast in the craniocaudal projection of a recent screening mammogram. EXAM: DIGITAL DIAGNOSTIC UNILATERAL RIGHT MAMMOGRAM WITH CAD AND TOMO COMPARISON:  Previous exam(s). ACR Breast Density Category c: The breast tissue is heterogeneously dense, which may obscure small masses. FINDINGS: 3D tomographic and 2D generated true lateral and spot compression craniocaudal views of the right breast were obtained. These demonstrate normal  appearing fibroglandular tissue at the locations of the recently suspected asymmetries. Mammographic images were processed with CAD. IMPRESSION: No evidence of malignancy. The recently suspected right breast asymmetries or areas of close apposition of normal breast tissue. RECOMMENDATION: Bilateral screening mammogram in 1 year. I have discussed the findings and recommendations with the patient. Results were also provided in writing at the conclusion of the visit. If applicable, a reminder letter will be sent to the patient regarding the next appointment. BI-RADS CATEGORY  1: Negative. Electronically Signed   By: Claudie Revering M.D.   On: 10/04/2018 14:38       Assessment & Plan:   Problem List Items Addressed This Visit    Breast tenderness - Primary    Palpable fullness right breast with overlying erythema.  Place on keflex.  Warm compresses.  Refer back to surgery.  Just had f/u mammogram right breast - birads I.        Relevant Orders   Ambulatory referral to General Surgery   Hypertension    Blood pressure under good control.  Continue same medication regimen.  Follow pressures.  Follow metabolic panel.        Relevant Orders   Hepatic function panel   Lipid panel   Basic metabolic panel (future)   Pituitary adenoma (HCC)    Saw Dr Ladell Pier.  Recommended f/u MRI 01/2020.       Right knee pain    No increased swelling. Wants to monitor.        Skin irritation    Reported noticing some skin irritation - adjacent to right eye.  No redness. No rash on exam.  States feels better.  Follow.        Varicose veins of both lower extremities with pain    Has seen vascular surgery.  Compression hose.  Lesion over ankle vein.  No redness.  No evidence of phlebitis.  Discussed referral. She wants to monitor.            Einar Pheasant, MD

## 2019-01-01 ENCOUNTER — Encounter: Payer: Self-pay | Admitting: Internal Medicine

## 2019-01-01 DIAGNOSIS — R238 Other skin changes: Secondary | ICD-10-CM | POA: Insufficient documentation

## 2019-01-01 DIAGNOSIS — N644 Mastodynia: Secondary | ICD-10-CM | POA: Insufficient documentation

## 2019-01-01 NOTE — Assessment & Plan Note (Signed)
Reported noticing some skin irritation - adjacent to right eye.  No redness. No rash on exam.  States feels better.  Follow.

## 2019-01-01 NOTE — Assessment & Plan Note (Signed)
No increased swelling. Wants to monitor.

## 2019-01-01 NOTE — Assessment & Plan Note (Signed)
Has seen vascular surgery.  Compression hose.  Lesion over ankle vein.  No redness.  No evidence of phlebitis.  Discussed referral. She wants to monitor.

## 2019-01-01 NOTE — Assessment & Plan Note (Signed)
Palpable fullness right breast with overlying erythema.  Place on keflex.  Warm compresses.  Refer back to surgery.  Just had f/u mammogram right breast - birads I.

## 2019-01-01 NOTE — Assessment & Plan Note (Signed)
Saw Dr O'Connell.  Recommended f/u MRI 01/2020.  

## 2019-01-01 NOTE — Assessment & Plan Note (Signed)
Blood pressure under good control.  Continue same medication regimen.  Follow pressures.  Follow metabolic panel.   

## 2019-01-07 ENCOUNTER — Other Ambulatory Visit: Payer: Self-pay | Admitting: Internal Medicine

## 2019-01-09 ENCOUNTER — Other Ambulatory Visit: Payer: Self-pay

## 2019-01-10 ENCOUNTER — Other Ambulatory Visit: Payer: Self-pay

## 2019-01-10 ENCOUNTER — Other Ambulatory Visit (INDEPENDENT_AMBULATORY_CARE_PROVIDER_SITE_OTHER): Payer: Medicare Other

## 2019-01-10 DIAGNOSIS — I10 Essential (primary) hypertension: Secondary | ICD-10-CM | POA: Diagnosis not present

## 2019-01-10 LAB — HEPATIC FUNCTION PANEL
ALT: 23 U/L (ref 0–35)
AST: 19 U/L (ref 0–37)
Albumin: 4.6 g/dL (ref 3.5–5.2)
Alkaline Phosphatase: 73 U/L (ref 39–117)
Bilirubin, Direct: 0.1 mg/dL (ref 0.0–0.3)
Total Bilirubin: 0.8 mg/dL (ref 0.2–1.2)
Total Protein: 6.9 g/dL (ref 6.0–8.3)

## 2019-01-10 LAB — BASIC METABOLIC PANEL
BUN: 18 mg/dL (ref 6–23)
CO2: 33 mEq/L — ABNORMAL HIGH (ref 19–32)
Calcium: 10.5 mg/dL (ref 8.4–10.5)
Chloride: 100 mEq/L (ref 96–112)
Creatinine, Ser: 0.53 mg/dL (ref 0.40–1.20)
GFR: 113.45 mL/min (ref >60.00)
Glucose, Bld: 89 mg/dL (ref 70–99)
Potassium: 4 mEq/L (ref 3.5–5.1)
Sodium: 142 mEq/L (ref 135–145)

## 2019-01-10 LAB — LIPID PANEL
Cholesterol: 204 mg/dL — ABNORMAL HIGH (ref 0–200)
HDL: 67.1 mg/dL (ref >39.00)
NonHDL: 136.72
Total CHOL/HDL Ratio: 3
Triglycerides: 204 mg/dL — ABNORMAL HIGH (ref 0.0–149.0)
VLDL: 40.8 mg/dL — ABNORMAL HIGH (ref 0.0–40.0)

## 2019-01-10 LAB — LDL CHOLESTEROL, DIRECT: Direct LDL: 81 mg/dL

## 2019-01-16 ENCOUNTER — Other Ambulatory Visit: Payer: Self-pay | Admitting: Internal Medicine

## 2019-01-18 ENCOUNTER — Other Ambulatory Visit: Payer: Self-pay | Admitting: Internal Medicine

## 2019-01-18 NOTE — Telephone Encounter (Signed)
Pt called and is needing to have this prescription updated stating pt is taking 2x a day. Please advise.

## 2019-01-18 NOTE — Telephone Encounter (Signed)
rx refilled for protonix bid

## 2019-02-13 ENCOUNTER — Encounter (INDEPENDENT_AMBULATORY_CARE_PROVIDER_SITE_OTHER): Payer: Self-pay

## 2019-02-13 ENCOUNTER — Encounter: Payer: Self-pay | Admitting: Internal Medicine

## 2019-02-13 ENCOUNTER — Ambulatory Visit (INDEPENDENT_AMBULATORY_CARE_PROVIDER_SITE_OTHER): Payer: Medicare HMO

## 2019-02-13 ENCOUNTER — Ambulatory Visit (INDEPENDENT_AMBULATORY_CARE_PROVIDER_SITE_OTHER): Payer: Medicare HMO | Admitting: Internal Medicine

## 2019-02-13 ENCOUNTER — Other Ambulatory Visit: Payer: Self-pay

## 2019-02-13 VITALS — Ht 66.0 in | Wt 144.0 lb

## 2019-02-13 DIAGNOSIS — D352 Benign neoplasm of pituitary gland: Secondary | ICD-10-CM | POA: Diagnosis not present

## 2019-02-13 DIAGNOSIS — E210 Primary hyperparathyroidism: Secondary | ICD-10-CM | POA: Diagnosis not present

## 2019-02-13 DIAGNOSIS — E7800 Pure hypercholesterolemia, unspecified: Secondary | ICD-10-CM

## 2019-02-13 DIAGNOSIS — I10 Essential (primary) hypertension: Secondary | ICD-10-CM | POA: Diagnosis not present

## 2019-02-13 DIAGNOSIS — F419 Anxiety disorder, unspecified: Secondary | ICD-10-CM

## 2019-02-13 DIAGNOSIS — D649 Anemia, unspecified: Secondary | ICD-10-CM

## 2019-02-13 DIAGNOSIS — G629 Polyneuropathy, unspecified: Secondary | ICD-10-CM

## 2019-02-13 DIAGNOSIS — F331 Major depressive disorder, recurrent, moderate: Secondary | ICD-10-CM

## 2019-02-13 DIAGNOSIS — J449 Chronic obstructive pulmonary disease, unspecified: Secondary | ICD-10-CM | POA: Diagnosis not present

## 2019-02-13 DIAGNOSIS — N644 Mastodynia: Secondary | ICD-10-CM

## 2019-02-13 DIAGNOSIS — Z0000 Encounter for general adult medical examination without abnormal findings: Secondary | ICD-10-CM | POA: Diagnosis not present

## 2019-02-13 NOTE — Progress Notes (Signed)
Patient ID: [name], female   DOB: June 04, 1945, 74 y.o.   MRN: [id number]   Virtual Visit via telephone Note  This visit type was conducted due to national recommendations for restrictions regarding the COVID-19 pandemic (e.g. social distancing).  This format is felt to be most appropriate for this patient at this time.  All issues noted in this document were discussed and addressed.  No physical exam was performed (except for noted visual exam findings with Video Visits).   I connected with [name] by telephone and verified that I am speaking with the correct person using two identifiers. Location patient: home Location provider: work  Persons participating in the telephonr visit: patient, provider  I discussed the limitations, risks, security and privacy concerns of performing an evaluation and management service by telephone and the availability of in person appointments.  The patient expressed understanding and agreed to proceed.    Reason for visit: scheduled follow up.    HPI: Reports increased stress and depression.  Increased with covid and having to stay in due to covid restrictions.  Sees her therapist regularly.  Has appt tomorrow.  Does not feel needs any further intervention.  Desires not to take medication.  Reports no chest pain or sob.  No acid reflux.  No abdominal pain. Bowels moving.  Noticed recently some nasal drainage.  Noticed neck nodule - 02/01/19.  No fever.  No sinus pressure, cough or congestion.  States neck nodule has decreased in size.  Will notify me if persistent.  Still with some breast soreness.  Last visit, breast tender and red.  Treated with abx. No redness now, just still some tenderness/nodules.  Has f/u planned with Dr Bary Castilla this month.  He is following.  Has neuropathy.  On gabapentin.  Also reports back and leg pain.  Has seen Dr Arnoldo Morale and has been to physical therapy.  Desires no further intervention at this time.     ROS: See  pertinent positives and negatives per HPI.  Past Medical History:  Diagnosis Date  . Anemia    takes iron supplements  . Anxiety   . Arthritis   . Breast mass, right 12/2017  . Burning feet syndrome   . Chronic insomnia   . Complication of anesthesia    "AFFECTS MY MEMORY"  . COPD (chronic obstructive pulmonary disease) ([place])    documented in outside records. patient denies this diagnosis  . Depression   . Dysrhythmia    PALPITATIONS when anxious  . Edema    MILD ANKLE  . GERD (gastroesophageal reflux disease)   . Heart murmur    current small sign of heart murmur  . History of stomach ulcers   . HOH (hard of hearing)    AIDS but doesn't always wear them  . Hypercholesterolemia   . Hyperparathyroidism, primary ([place])   . Hypertension   . Neuropathy    arms, feet, legs, neck  . Osteoporosis   . Palpitations   . Scoliosis   . Shortness of breath    with exertion  . Tingling    both hands    Past Surgical History:  Procedure Laterality Date  . ANTERIOR CERVICAL DECOMP/DISCECTOMY FUSION  10/28/2011   Procedure: ANTERIOR CERVICAL DECOMPRESSION/DISCECTOMY FUSION 2 LEVELS;  Surgeon: Elaina Hoops, MD;  Location: [facility];  Service: Neurosurgery;  Laterality: N/A;  . BREAST BIOPSY Right 12/17/2017   Procedure: BREAST BIOPSY;  Surgeon: Robert Bellow, MD;  Location: [facility];  Service: General;  Laterality: Right;  . BREAST EXCISIONAL BIOPSY Right 1992   neg  . BREAST LUMPECTOMY Right 12/2017   Benign  . BREAST SURGERY     RT BREAST CYST REMOVED 15 YRS AGO   . CATARACT EXTRACTION W/PHACO Right 10/30/2014   Procedure: CATARACT EXTRACTION PHACO AND INTRAOCULAR LENS PLACEMENT (IOC);  Surgeon: Birder Robson, MD;  Location: [facility];  Service: Ophthalmology;  Laterality: Right;  US0:40 AP18.2 CDE7.42  [place] [id number]  . CATARACT EXTRACTION W/PHACO Left 11/27/2014   Procedure: CATARACT EXTRACTION PHACO AND INTRAOCULAR LENS PLACEMENT (IOC);  Surgeon: Birder Robson, MD;  Location: [facility];  Service: Ophthalmology;  Laterality: Left;  [place] 00:36.9 AP%: 18.9 CDE: 6.96  [place] #  [id number]  . CYSTOCELE REPAIR N/A 05/13/2015   Procedure: ANTERIOR REPAIR (CYSTOCELE);  Surgeon: Brayton Mars, MD;  Location: [facility];  Service: Gynecology;  Laterality: N/A;  . CYSTOSCOPY  05/13/2015   Procedure: CYSTOSCOPY;  Surgeon: Brayton Mars, MD;  Location: [facility];  Service: Gynecology;;  . dental implant    . DILATION AND CURETTAGE OF UTERUS     X2   . EYE SURGERY Bilateral    cataract extractions  . JOINT REPLACEMENT     RT HIP  2013  (PARTIAL)   . LAPAROSCOPY  2005   bowel obstruction  . PARATHYROIDECTOMY Left 08/25/2012   Procedure: PARATHYROIDECTOMY;  Surgeon: Earnstine Regal, MD;  Location: [facility];  Service: General;  Laterality: Left;  . PITUITARY SURGERY     FOR TUMOR 3 YRS   . REFRACTIVE SURGERY    . TOTAL KNEE ARTHROPLASTY Right 07/18/2014   Procedure: TOTAL KNEE ARTHROPLASTY;  Surgeon: Dereck Leep, MD;  Location: [facility];  Service: Orthopedics;  Laterality: Right;  Marland Kitchen VAGINAL HYSTERECTOMY Bilateral 05/13/2015   Procedure: HYSTERECTOMY VAGINAL WITH BILATERAL SALPINGO OOPHERECTOMY;  Surgeon: Brayton Mars, MD;  Location: [facility];  Service: Gynecology;  Laterality: Bilateral;  . VAGINAL HYSTERECTOMY     bso ant repair    Family History  Problem Relation Age of Onset  . Glaucoma Mother   . Hypertension Mother   . Thyroid disease Mother   . Stroke Father   . Hypertension Father   . Bipolar disorder Sister   . Breast cancer Paternal Aunt        60's  . Cancer Neg Hx   . Diabetes Neg Hx     SOCIAL HX: reviewed.    Current Outpatient Medications:  .  acetaminophen (TYLENOL) 500 MG tablet, Take 1,000 mg by mouth every 6 (six) hours as needed for moderate pain, fever or headache. , Disp: , Rfl:  .  Alpha-Lipoic Acid 600 MG CAPS, Take 600 mg by mouth daily. , Disp: , Rfl:  .  amLODipine (NORVASC) 5 MG tablet, Take 1  tablet (5 mg total) by mouth daily., Disp: 90 tablet, Rfl: 1 .  aspirin EC 81 MG tablet, Take 81 mg by mouth 4 (four) times a week. , Disp: , Rfl:  .  atorvastatin (LIPITOR) 40 MG tablet, TAKE ONE TABLET BY MOUTH EVERY DAY, Disp: 90 tablet, Rfl: 1 .  Biotin w/ Vitamins C & E (HAIR/SKIN/NAILS PO), Take 1 tablet by mouth daily., Disp: , Rfl:  .  chlorthalidone (HYGROTON) 25 MG tablet, TAKE ONE TABLET EVERY DAY, Disp: 90 tablet, Rfl: 1 .  cholecalciferol (VITAMIN D) 1000 UNITS tablet, Take 1,000 Units by mouth daily. , Disp: , Rfl:  .  docusate sodium (COLACE) 100 MG capsule, Take  100 mg by mouth daily. , Disp: , Rfl:  .  ferrous sulfate 325 (65 FE) MG tablet, Take 325 mg by mouth daily., Disp: , Rfl:  .  gabapentin (NEURONTIN) 300 MG capsule, Take 600 mg by mouth at bedtime. , Disp: , Rfl:  .  losartan (COZAAR) 100 MG tablet, TAKE ONE TABLET BY MOUTH EVERY DAY, Disp: 90 tablet, Rfl: 1 .  magnesium oxide (MAG-OX) 400 MG tablet, Take 400 mg by mouth daily., Disp: , Rfl:  .  MEGARED OMEGA-3 KRILL OIL PO, Take 1 capsule by mouth at bedtime., Disp: , Rfl:  .  pantoprazole (PROTONIX) 40 MG tablet, Take 1 tablet (40 mg total) by mouth 2 (two) times daily before a meal., Disp: 60 tablet, Rfl: 2 .  Polyethyl Glycol-Propyl Glycol (SYSTANE) 0.4-0.3 % SOLN, Place 1 drop into both eyes 2 (two) times daily as needed (for dry eyes)., Disp: , Rfl:  .  potassium chloride (KLOR-CON) 10 MEQ tablet, TAKE ONE TABLET (10 MEQ) BY MOUTH TWICE DAILY, Disp: 180 tablet, Rfl: 0 .  Probiotic Product (FLORAJEN3 PO), Take 1 capsule by mouth daily. , Disp: , Rfl:  .  senna-docusate (SENOKOT-S) 8.6-50 MG tablet, Take 1 tablet by mouth at bedtime as needed for mild constipation., Disp: , Rfl:   EXAM:  GENERAL: alert.  Sounds to be in no acute distress.  Answering questions appropriately.    PSYCH/NEURO: pleasant and cooperative, no obvious depression or anxiety, speech and thought processing grossly intact  ASSESSMENT AND  PLAN:  Discussed the following assessment and plan:  Anemia hgb 06/28/18 - wnl.    Anxiety Discussed with her today.  She is seeing a therapist regularly.  Has appt tomorrow.  Discussed medications and question of need for other interventions.  She declines.    Breast tenderness Redness resolved.  Some persistent tenderness as outlined.  Has f/u with Dr Bary Castilla later this month.    COPD (chronic obstructive pulmonary disease) ([facility]) No increased sob.  Breathing stable.  No increased cough or congestion.    Hypercholesterolemia On lipitor.  Low cholesterol diet and exercise.  Follow lipid panel and liver function tests.    Hyperparathyroidism, primary S/p surgery.  Recent calcium wnl.  Check calcium with next labs.    Hypertension Blood pressure has been under control.  Continue current medication regimen.  Follow pressures.  Follow metabolic panel.    Neuropathy Continue gabapentin.  Seeing neurology.    Pituitary adenoma (HCC) Saw Dr Honor Junes.  Recommended f/u MRI in 01/2020.  No vision change reported.  Follow.    MDD (major depressive disorder), recurrent episode, moderate ([place]) Discussed with her today.  Seeing a therapist regularly.  Has f/u appt tomorrow.  Discussed with her today.  No suicidal ideations.  Discussed medication.  States she has been on medication previously and did not help.  Desires no further intervention.  Follow.     Orders Placed This Encounter  Procedures  . Hepatic function panel    Standing Status:   Future    Standing Expiration Date:   02/13/2020  . Lipid panel    Standing Status:   Future    Standing Expiration Date:   02/13/2020  . Basic metabolic panel (future)    Standing Status:   Future    Standing Expiration Date:   02/13/2020    No orders of the defined types were placed in this encounter.    I discussed the assessment and treatment plan with the patient. The patient was  provided an opportunity to ask questions and all were answered.  The patient agreed with the plan and demonstrated an understanding of the instructions.   The patient was advised to call back or seek an in-person evaluation if the symptoms worsen or if the condition fails to improve as anticipated.  I provided 23 minutes of non-face-to-face time during this encounter.   Einar Pheasant, MD

## 2019-02-13 NOTE — Progress Notes (Addendum)
Subjective:   [name] is a 74 y.o. female who presents for Medicare Annual (Subsequent) preventive examination.  Review of Systems:  No ROS.  Medicare Wellness Virtual Visit.  Visual/audio telehealth visit, UTA vital signs.   Wt/Ht provided.  See social history for additional risk factors.   Cardiac Risk Factors include: advanced age (>36men, >37 women)     Objective:     Vitals: Ht 5\' 6"  (1.676 m)   Wt 144 lb (65.3 kg)   BMI 23.24 kg/m   Body mass index is 23.24 kg/m.  Advanced Directives 02/13/2019 02/08/2018 12/17/2017 12/14/2017 02/05/2017 01/29/2016 05/13/2015  Does Patient Have a Medical Advance Directive? Yes Yes Yes Yes No Yes No  Type of Paramedic of [place];Living will [place];Living will Living will;Healthcare Power of [place];Living will -  Does patient want to make changes to medical advance directive? No - Patient declined No - Patient declined No - Patient declined No - Patient declined - No - Patient declined -  Copy of [place] in Chart? No - copy requested No - copy requested No - copy requested No - copy requested - No - copy requested -  Would patient like information on creating a medical advance directive? - - - - Yes (MAU/Ambulatory/Procedural Areas - Information given) - -    Tobacco Social History   Tobacco Use  Smoking Status Former Smoker  . Packs/day: 1.00  . Years: 35.00  . Pack years: 35.00  . Types: Cigarettes  . Quit date: 02/10/2008  . Years since quitting: 11.0  Smokeless Tobacco Never Used     Counseling given: Not Answered   Clinical Intake:  Pre-visit preparation completed: Yes           How often do you need to have someone help you when you read instructions, pamphlets, or other written materials from your doctor or pharmacy?: 1 - Never  Interpreter Needed?: No     Past Medical  History:  Diagnosis Date  . Anemia    takes iron supplements  . Anxiety   . Arthritis   . Breast mass, right 12/2017  . Burning feet syndrome   . Chronic insomnia   . Complication of anesthesia    "AFFECTS MY MEMORY"  . COPD (chronic obstructive pulmonary disease) ([place])    documented in outside records. patient denies this diagnosis  . Depression   . Dysrhythmia    PALPITATIONS when anxious  . Edema    MILD ANKLE  . GERD (gastroesophageal reflux disease)   . Heart murmur    current small sign of heart murmur  . History of stomach ulcers   . HOH (hard of hearing)    AIDS but doesn't always wear them  . Hypercholesterolemia   . Hyperparathyroidism, primary ([place])   . Hypertension   . Neuropathy    arms, feet, legs, neck  . Osteoporosis   . Palpitations   . Scoliosis   . Shortness of breath    with exertion  . Tingling    both hands   Past Surgical History:  Procedure Laterality Date  . ANTERIOR CERVICAL DECOMP/DISCECTOMY FUSION  10/28/2011   Procedure: ANTERIOR CERVICAL DECOMPRESSION/DISCECTOMY FUSION 2 LEVELS;  Surgeon: Elaina Hoops, MD;  Location: [facility];  Service: Neurosurgery;  Laterality: N/A;  . BREAST BIOPSY Right 12/17/2017   Procedure: BREAST BIOPSY;  Surgeon: Robert Bellow, MD;  Location:  [facility];  Service: General;  Laterality: Right;  . BREAST EXCISIONAL BIOPSY Right 1992   neg  . BREAST LUMPECTOMY Right 12/2017   Benign  . BREAST SURGERY     RT BREAST CYST REMOVED 15 YRS AGO   . CATARACT EXTRACTION W/PHACO Right 10/30/2014   Procedure: CATARACT EXTRACTION PHACO AND INTRAOCULAR LENS PLACEMENT (IOC);  Surgeon: Birder Robson, MD;  Location: [facility];  Service: Ophthalmology;  Laterality: Right;  US0:40 AP18.2 CDE7.42  [place] [id number]  . CATARACT EXTRACTION W/PHACO Left 11/27/2014   Procedure: CATARACT EXTRACTION PHACO AND INTRAOCULAR LENS PLACEMENT (IOC);  Surgeon: Birder Robson, MD;  Location: [facility];  Service: Ophthalmology;   Laterality: Left;  [place] 00:36.9 AP%: 18.9 CDE: 6.96  [place] #  [id number]  . CYSTOCELE REPAIR N/A 05/13/2015   Procedure: ANTERIOR REPAIR (CYSTOCELE);  Surgeon: Brayton Mars, MD;  Location: [facility];  Service: Gynecology;  Laterality: N/A;  . CYSTOSCOPY  05/13/2015   Procedure: CYSTOSCOPY;  Surgeon: Brayton Mars, MD;  Location: [facility];  Service: Gynecology;;  . dental implant    . DILATION AND CURETTAGE OF UTERUS     X2   . EYE SURGERY Bilateral    cataract extractions  . JOINT REPLACEMENT     RT HIP  2013  (PARTIAL)   . LAPAROSCOPY  2005   bowel obstruction  . PARATHYROIDECTOMY Left 08/25/2012   Procedure: PARATHYROIDECTOMY;  Surgeon: Earnstine Regal, MD;  Location: [facility];  Service: General;  Laterality: Left;  . PITUITARY SURGERY     FOR TUMOR 3 YRS   . REFRACTIVE SURGERY    . TOTAL KNEE ARTHROPLASTY Right 07/18/2014   Procedure: TOTAL KNEE ARTHROPLASTY;  Surgeon: Dereck Leep, MD;  Location: [facility];  Service: Orthopedics;  Laterality: Right;  Marland Kitchen VAGINAL HYSTERECTOMY Bilateral 05/13/2015   Procedure: HYSTERECTOMY VAGINAL WITH BILATERAL SALPINGO OOPHERECTOMY;  Surgeon: Brayton Mars, MD;  Location: [facility];  Service: Gynecology;  Laterality: Bilateral;  . VAGINAL HYSTERECTOMY     bso ant repair   Family History  Problem Relation Age of Onset  . Glaucoma Mother   . Hypertension Mother   . Thyroid disease Mother   . Stroke Father   . Hypertension Father   . Bipolar disorder Sister   . Breast cancer Paternal Aunt        60's  . Cancer Neg Hx   . Diabetes Neg Hx    Social History   Socioeconomic History  . Marital status: Divorced    Spouse name: Not on file  . Number of children: 1  . Years of education: Not on file  . Highest education level: Some college, no degree  Occupational History  . 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    Employer: [employer]    Comment: retired  Tobacco Use  . Smoking status: Former Smoker      Packs/day: 1.00    Years: 35.00    Pack years: 35.00    Types: Cigarettes    Quit date: 02/10/2008    Years since quitting: 11.0  . Smokeless tobacco: Never Used  Substance and Sexual Activity  . Alcohol use: Yes    Alcohol/week: 0.0 standard drinks    Comment: rare, like holidays  . Drug use: No  . Sexual activity: Never    Birth control/protection: Post-menopausal  Other Topics Concern  . Not on file  Social History Narrative  . Not on file   Social Determinants of Health  Financial Resource Strain: Low Risk   . Difficulty of Paying Living Expenses: Not hard at all  Food Insecurity: No Food Insecurity  . Worried About Charity fundraiser in the Last Year: Never true  . Ran Out of Food in the Last Year: Never true  Transportation Needs: No Transportation Needs  . Lack of Transportation (Medical): No  . Lack of Transportation (Non-Medical): No  Physical Activity: Sufficiently Active  . Days of Exercise per Week: 7 days  . Minutes of Exercise per Session: 50 min  Stress:   . Feeling of Stress : Not on file  Social Connections:   . Frequency of Communication with Friends and Family: Not on file  . Frequency of Social Gatherings with Friends and Family: Not on file  . Attends Religious Services: Not on file  . Active Member of Clubs or Organizations: Not on file  . Attends Archivist Meetings: Not on file  . Marital Status: Not on file    Outpatient Encounter Medications as of 02/13/2019  Medication Sig  . acetaminophen (TYLENOL) 500 MG tablet Take 1,000 mg by mouth every 6 (six) hours as needed for moderate pain, fever or headache.   . Alpha-Lipoic Acid 600 MG CAPS Take 600 mg by mouth daily.   Marland Kitchen amLODipine (NORVASC) 5 MG tablet Take 1 tablet (5 mg total) by mouth daily.  Marland Kitchen aspirin EC 81 MG tablet Take 81 mg by mouth 4 (four) times a week.   Marland Kitchen atorvastatin (LIPITOR) 40 MG tablet TAKE ONE TABLET BY MOUTH EVERY DAY  . Biotin w/ Vitamins C & E (HAIR/SKIN/NAILS  PO) Take 1 tablet by mouth daily.  . chlorthalidone (HYGROTON) 25 MG tablet TAKE ONE TABLET EVERY DAY  . cholecalciferol (VITAMIN D) 1000 UNITS tablet Take 1,000 Units by mouth daily.   Marland Kitchen docusate sodium (COLACE) 100 MG capsule Take 100 mg by mouth daily.   . ferrous sulfate 325 (65 FE) MG tablet Take 325 mg by mouth daily.  Marland Kitchen gabapentin (NEURONTIN) 300 MG capsule Take 600 mg by mouth at bedtime.   Marland Kitchen losartan (COZAAR) 100 MG tablet TAKE ONE TABLET BY MOUTH EVERY DAY  . magnesium oxide (MAG-OX) 400 MG tablet Take 400 mg by mouth daily.  Marland Kitchen MEGARED OMEGA-3 KRILL OIL PO Take 1 capsule by mouth at bedtime.  . pantoprazole (PROTONIX) 40 MG tablet Take 1 tablet (40 mg total) by mouth 2 (two) times daily before a meal.  . Polyethyl Glycol-Propyl Glycol (SYSTANE) 0.4-0.3 % SOLN Place 1 drop into both eyes 2 (two) times daily as needed (for dry eyes).  . potassium chloride (KLOR-CON) 10 MEQ tablet TAKE ONE TABLET (10 MEQ) BY MOUTH TWICE DAILY  . Probiotic Product (FLORAJEN3 PO) Take 1 capsule by mouth daily.   Marland Kitchen senna-docusate (SENOKOT-S) 8.6-50 MG tablet Take 1 tablet by mouth at bedtime as needed for mild constipation.  . [DISCONTINUED] cephALEXin (KEFLEX) 500 MG capsule Take 1 capsule (500 mg total) by mouth 3 (three) times daily.   No facility-administered encounter medications on file as of 02/13/2019.    Activities of Daily Living In your present state of health, do you have any difficulty performing the following activities: 02/13/2019  Hearing? Y  Comment Hearing aids  Vision? N  Difficulty concentrating or making decisions? N  Walking or climbing stairs? N  Dressing or bathing? N  Doing errands, shopping? N  Preparing Food and eating ? N  Using the Toilet? N  In the past six months, have  you accidently leaked urine? N  Do you have problems with loss of bowel control? N  Managing your Medications? N  Managing your Finances? N  Housekeeping or managing your Housekeeping? N  Some recent  data might be hidden    Patient Care Team: Einar Pheasant, MD as PCP - General (Internal Medicine) Marry Guan, Laurice Record, MD (Orthopedic Surgery)    Assessment:   This is a routine wellness examination for [place].  Nurse connected with patient 02/13/19 at  9:30 AM EST by a telephone enabled telemedicine application and verified that I am speaking with the correct person using two identifiers. Patient stated full name and DOB. Patient gave permission to continue with virtual visit. Patient's location was at home and Nurse's location was at [place] office.   Patient is alert and oriented x3. Patient notes some difficulty focusing or concentrating. Patient likes to watch sports on tv for brain stimulation.   Health Maintenance Due: See completed HM at the end of note.   Eye: Visual acuity not assessed. Virtual visit. Followed by their ophthalmologist.  Dental: UTD  Hearing: Hearing aids- yes  Safety:  Patient feels safe at home- yes Patient does have smoke detectors at home- yes Patient does wear sunscreen or protective clothing when in direct sunlight - yes Patient does wear seat belt when in a moving vehicle - yes Patient drives- yes Adequate lighting in walkways free from debris- yes Grab bars and handrails used as appropriate- yes Ambulates with an assistive device- no  Cell phone on person when ambulating outside of the home- yes  Social: Alcohol intake - yes      Smoking history-former  Smokers in home? none Illicit drug use? none  Medication: Taking as directed and without issues.  Pill box in use -yes  Self managed - yes   Covid-19: Precautions and sickness symptoms discussed. Wears mask, social distancing, hand hygiene as appropriate.   Activities of Daily Living Patient denies needing assistance with: household chores, feeding themselves, getting from bed to chair, getting to the toilet, bathing/showering, dressing, managing money, or preparing meals.    Discussed the importance of a healthy diet, water intake and the benefits of aerobic exercise.   Physical activity- walks her dog daily about 45 minutes, stretches.  Diet:  Regular Water: fair intake; she plans to drink more.  Caffeine: 3 cups of coffee.   Other Providers Patient Care Team: Einar Pheasant, MD as PCP - General (Internal Medicine) Marry Guan, Laurice Record, MD (Orthopedic Surgery)  Exercise Activities and Dietary recommendations Current Exercise Habits: Home exercise routine, Type of exercise: walking;stretching, Time (Minutes): 45, Intensity: Mild  Goals      Patient Stated   . Healthy Lifestyle (pt-stated)     Keep all routine scheduled appointments Follow up with Therapist as needed Take all medications as directed       Fall Risk Fall Risk  02/13/2019 07/19/2018 02/08/2018 01/25/2018 01/25/2018  Falls in the past year? 0 0 0 0 0  Number falls in past yr: - 0 - - -  Injury with Fall? - 0 - - -  Risk for fall due to : - - - - -  Follow up Falls prevention discussed - - Falls evaluation completed -    Timed Get Up and Go performed: no, virtual visit  Depression Screen PHQ 2/9 Scores 02/13/2019 08/05/2018 02/08/2018 02/05/2017  PHQ - 2 Score - 3 - 1  PHQ- 9 Score - 16 - 3  Exception Documentation Other- indicate reason  in comment box - Other- indicate reason in comment box -  Not completed Followed in counseling every 3 weeks - - -     Cognitive Function MMSE - Mini Mental State Exam 06/18/2014  Orientation to time 5  Orientation to Place 5  Registration 3  Attention/ Calculation 5  Recall 3  Language- name 2 objects 2  Language- repeat 1  Language- follow 3 step command 3  Language- read & follow direction 1  Write a sentence 1  Copy design 1  Total score 30     6CIT Screen 02/13/2019 02/08/2018 02/05/2017 01/29/2016  What Year? 0 points 0 points 0 points 0 points  What month? 0 points 0 points 0 points 0 points  What time? 0 points 0 points 0 points 0  points  Count back from 20 0 points 0 points 0 points 0 points  Months in reverse 2 points 0 points 0 points 0 points  Repeat phrase 2 points 0 points 0 points -  Total Score 4 0 0 -    Immunization History  Administered Date(s) Administered  . Influenza Split 02/12/2012  . Influenza, High Dose Seasonal PF 10/11/2015, 10/16/2016, 12/31/2017  . Influenza,inj,Quad PF,6+ Mos 10/24/2012, 11/28/2013, 12/17/2014  . Pneumococcal Conjugate-13 05/17/2014  . Pneumococcal Polysaccharide-23 02/05/2017  . Tdap 05/28/2011  . Zoster Recombinat (Shingrix) 12/30/2018   Screening Tests Health Maintenance  Topic Date Due  . INFLUENZA VACCINE  05/10/2019 (Originally 09/10/2018)  . MAMMOGRAM  09/19/2020  . TETANUS/TDAP  05/27/2021  . COLONOSCOPY  09/16/2023  . DEXA SCAN  Completed  . Hepatitis C Screening  Completed  . PNA vac Low Risk Adult  Completed      Plan:   Keep all routine maintenance appointments.   Follow up with your doctor today @ 10:00  Medicare Attestation I have personally reviewed: The patient's medical and social history Their use of alcohol, tobacco or illicit drugs Their current medications and supplements The patient's functional ability including ADLs,fall risks, home safety risks, cognitive, and hearing and visual impairment Diet and physical activities Evidence for depression    I have reviewed and discussed with patient certain preventive protocols, quality metrics, and best practice recommendations.     Varney Biles, LPN  [id number]   Reviewed above information.  Agree with assessment and plan.    Dr Nicki Reaper

## 2019-02-13 NOTE — Patient Instructions (Addendum)
  [name] , Thank you for taking time to come for your Medicare Wellness Visit. I appreciate your ongoing commitment to your health goals. Please review the following plan we discussed and let me know if I can assist you in the future.   These are the goals we discussed: Goals      Patient Stated   . Healthy Lifestyle (pt-stated)     Keep all routine scheduled appointments Follow up with Therapist as needed Take all medications as directed       This is a list of the screening recommended for you and due dates:  Health Maintenance  Topic Date Due  . Flu Shot  05/10/2019*  . Mammogram  09/19/2020  . Tetanus Vaccine  05/27/2021  . Colon Cancer Screening  09/16/2023  . DEXA scan (bone density measurement)  Completed  .  Hepatitis C: One time screening is recommended by Center for Disease Control  (CDC) for  adults born from [birth year] through 1963.   Completed  . Pneumonia vaccines  Completed  *Topic was postponed. The date shown is not the original due date.

## 2019-02-14 ENCOUNTER — Ambulatory Visit (INDEPENDENT_AMBULATORY_CARE_PROVIDER_SITE_OTHER): Payer: Medicare HMO | Admitting: Psychology

## 2019-02-14 DIAGNOSIS — F3289 Other specified depressive episodes: Secondary | ICD-10-CM

## 2019-02-14 DIAGNOSIS — F331 Major depressive disorder, recurrent, moderate: Secondary | ICD-10-CM | POA: Insufficient documentation

## 2019-02-14 NOTE — Assessment & Plan Note (Signed)
Continue gabapentin.  Seeing neurology.

## 2019-02-14 NOTE — Assessment & Plan Note (Signed)
Saw Dr Honor Junes.  Recommended f/u MRI in 01/2020.  No vision change reported.  Follow.

## 2019-02-14 NOTE — Assessment & Plan Note (Signed)
S/p surgery.  Recent calcium wnl.  Check calcium with next labs.

## 2019-02-14 NOTE — Assessment & Plan Note (Signed)
On lipitor.  Low cholesterol diet and exercise.  Follow lipid panel and liver function tests.   

## 2019-02-14 NOTE — Assessment & Plan Note (Signed)
hgb 06/28/18 - wnl.

## 2019-02-14 NOTE — Assessment & Plan Note (Signed)
Blood pressure has been under control.  Continue current medication regimen.  Follow pressures.  Follow metabolic panel.

## 2019-02-14 NOTE — Assessment & Plan Note (Signed)
Redness resolved.  Some persistent tenderness as outlined.  Has f/u with Dr Bary Castilla later this month.

## 2019-02-14 NOTE — Assessment & Plan Note (Signed)
No increased sob.  Breathing stable.  No increased cough or congestion.

## 2019-02-14 NOTE — Assessment & Plan Note (Signed)
Discussed with her today.  Seeing a therapist regularly.  Has f/u appt tomorrow.  Discussed with her today.  No suicidal ideations.  Discussed medication.  States she has been on medication previously and did not help.  Desires no further intervention.  Follow.

## 2019-02-14 NOTE — Assessment & Plan Note (Signed)
Discussed with her today.  She is seeing a therapist regularly.  Has appt tomorrow.  Discussed medications and question of need for other interventions.  She declines.

## 2019-02-20 DIAGNOSIS — F419 Anxiety disorder, unspecified: Secondary | ICD-10-CM | POA: Diagnosis not present

## 2019-02-20 DIAGNOSIS — G608 Other hereditary and idiopathic neuropathies: Secondary | ICD-10-CM | POA: Diagnosis not present

## 2019-02-20 DIAGNOSIS — R001 Bradycardia, unspecified: Secondary | ICD-10-CM | POA: Diagnosis not present

## 2019-02-20 DIAGNOSIS — G5603 Carpal tunnel syndrome, bilateral upper limbs: Secondary | ICD-10-CM | POA: Diagnosis not present

## 2019-02-20 DIAGNOSIS — R42 Dizziness and giddiness: Secondary | ICD-10-CM | POA: Diagnosis not present

## 2019-02-20 DIAGNOSIS — D352 Benign neoplasm of pituitary gland: Secondary | ICD-10-CM | POA: Diagnosis not present

## 2019-02-20 DIAGNOSIS — M545 Low back pain: Secondary | ICD-10-CM | POA: Diagnosis not present

## 2019-03-22 ENCOUNTER — Other Ambulatory Visit: Payer: Self-pay | Admitting: Internal Medicine

## 2019-03-22 ENCOUNTER — Ambulatory Visit (INDEPENDENT_AMBULATORY_CARE_PROVIDER_SITE_OTHER): Payer: Medicare HMO | Admitting: Psychology

## 2019-03-22 DIAGNOSIS — F3289 Other specified depressive episodes: Secondary | ICD-10-CM

## 2019-03-24 ENCOUNTER — Other Ambulatory Visit: Payer: Self-pay

## 2019-03-24 ENCOUNTER — Ambulatory Visit: Payer: Medicare HMO | Attending: Internal Medicine

## 2019-03-24 DIAGNOSIS — Z23 Encounter for immunization: Secondary | ICD-10-CM

## 2019-03-24 NOTE — Progress Notes (Signed)
   Covid-19 Vaccination Clinic  Name:  [name]    MRN: [id number] DOB: 05-31-1945  03/24/2019  Ms. [name] was observed post Covid-19 immunization for 15 minutes without incidence. She was provided with Vaccine Information Sheet and instruction to access the V-Safe system.   Ms. [name] was instructed to call 911 with any severe reactions post vaccine: Marland Kitchen Difficulty breathing  . Swelling of your face and throat  . A fast heartbeat  . A bad rash all over your body  . Dizziness and weakness    Immunizations Administered    Name Date Dose VIS Date Route   Pfizer COVID-19 Vaccine 03/24/2019 11:56 AM 0.3 mL 01/20/2019 Intramuscular   Manufacturer: [place]   Lot: [id number]   [place]: [id number]

## 2019-04-07 ENCOUNTER — Other Ambulatory Visit: Payer: Self-pay | Admitting: Internal Medicine

## 2019-04-07 NOTE — Telephone Encounter (Signed)
Refill request for Klor-con, last seen 02-13-19, last filled 01-09-19.  Please advise. Last potassium result was 4.0.

## 2019-04-07 NOTE — Telephone Encounter (Signed)
rx ok'd for potassium #180 with no refills.

## 2019-04-18 ENCOUNTER — Other Ambulatory Visit: Payer: Self-pay | Admitting: Internal Medicine

## 2019-04-19 ENCOUNTER — Ambulatory Visit (INDEPENDENT_AMBULATORY_CARE_PROVIDER_SITE_OTHER): Payer: Medicare HMO | Admitting: Psychology

## 2019-04-19 ENCOUNTER — Ambulatory Visit: Payer: Medicare HMO | Attending: Internal Medicine

## 2019-04-19 DIAGNOSIS — F3289 Other specified depressive episodes: Secondary | ICD-10-CM | POA: Diagnosis not present

## 2019-04-19 DIAGNOSIS — Z23 Encounter for immunization: Secondary | ICD-10-CM

## 2019-04-19 NOTE — Progress Notes (Signed)
   Covid-19 Vaccination Clinic  Name:  [name]    MRN: [id number] DOB: Sep 13, 1945  04/19/2019  [name] was observed post Covid-19 immunization for 15 minutes without incident. She was provided with Vaccine Information Sheet and instruction to access the V-Safe system.   [name] was instructed to call 911 with any severe reactions post vaccine: Marland Kitchen Difficulty breathing  . Swelling of face and throat  . A fast heartbeat  . A bad rash all over body  . Dizziness and weakness   Immunizations Administered    Name Date Dose VIS Date Route   Pfizer COVID-19 Vaccine 04/19/2019 10:18 AM 0.3 mL 01/20/2019 Intramuscular   Manufacturer: [place]   Lot: [id number]   [place]: [id number]

## 2019-04-20 ENCOUNTER — Telehealth: Payer: Self-pay | Admitting: Internal Medicine

## 2019-04-20 NOTE — Telephone Encounter (Signed)
Patient wants to discuss her medication. She is taking fish oil and she wants to know if the brand she is getting at a different drugstore would be better. She said she really didn't know how to explain it but wants a callback.

## 2019-04-20 NOTE — Telephone Encounter (Signed)
Routed to wrong person. My apologies.

## 2019-04-25 ENCOUNTER — Other Ambulatory Visit: Payer: Self-pay | Admitting: Internal Medicine

## 2019-04-26 NOTE — Telephone Encounter (Signed)
Left detailed message for pt 

## 2019-05-25 ENCOUNTER — Other Ambulatory Visit (INDEPENDENT_AMBULATORY_CARE_PROVIDER_SITE_OTHER): Payer: Medicare HMO

## 2019-05-25 ENCOUNTER — Other Ambulatory Visit: Payer: Self-pay

## 2019-05-25 DIAGNOSIS — I10 Essential (primary) hypertension: Secondary | ICD-10-CM | POA: Diagnosis not present

## 2019-05-25 DIAGNOSIS — E7800 Pure hypercholesterolemia, unspecified: Secondary | ICD-10-CM | POA: Diagnosis not present

## 2019-05-25 LAB — LIPID PANEL
Cholesterol: 186 mg/dL (ref 0–200)
HDL: 68.7 mg/dL (ref >39.00)
LDL Cholesterol: 98 mg/dL (ref 0–99)
NonHDL: 117.48
Total CHOL/HDL Ratio: 3
Triglycerides: 97 mg/dL (ref 0.0–149.0)
VLDL: 19.4 mg/dL (ref 0.0–40.0)

## 2019-05-25 LAB — BASIC METABOLIC PANEL
BUN: 22 mg/dL (ref 6–23)
CO2: 30 mEq/L (ref 19–32)
Calcium: 9.9 mg/dL (ref 8.4–10.5)
Chloride: 104 mEq/L (ref 96–112)
Creatinine, Ser: 0.64 mg/dL (ref 0.40–1.20)
GFR: 91.17 mL/min (ref >60.00)
Glucose, Bld: 99 mg/dL (ref 70–99)
Potassium: 3.4 mEq/L — ABNORMAL LOW (ref 3.5–5.1)
Sodium: 141 mEq/L (ref 135–145)

## 2019-05-25 LAB — HEPATIC FUNCTION PANEL
ALT: 22 U/L (ref 0–35)
AST: 22 U/L (ref 0–37)
Albumin: 4.5 g/dL (ref 3.5–5.2)
Alkaline Phosphatase: 61 U/L (ref 39–117)
Bilirubin, Direct: 0.1 mg/dL (ref 0.0–0.3)
Total Bilirubin: 0.7 mg/dL (ref 0.2–1.2)
Total Protein: 6.7 g/dL (ref 6.0–8.3)

## 2019-05-30 ENCOUNTER — Other Ambulatory Visit: Payer: Self-pay

## 2019-05-30 ENCOUNTER — Encounter: Payer: Self-pay | Admitting: Internal Medicine

## 2019-05-30 ENCOUNTER — Ambulatory Visit (INDEPENDENT_AMBULATORY_CARE_PROVIDER_SITE_OTHER): Payer: Medicare HMO | Admitting: Internal Medicine

## 2019-05-30 VITALS — BP 138/82 | HR 76 | Temp 97.2°F | Resp 16 | Ht 66.0 in | Wt 147.2 lb

## 2019-05-30 DIAGNOSIS — F419 Anxiety disorder, unspecified: Secondary | ICD-10-CM | POA: Diagnosis not present

## 2019-05-30 DIAGNOSIS — F320 Major depressive disorder, single episode, mild: Secondary | ICD-10-CM

## 2019-05-30 DIAGNOSIS — D352 Benign neoplasm of pituitary gland: Secondary | ICD-10-CM

## 2019-05-30 DIAGNOSIS — E7800 Pure hypercholesterolemia, unspecified: Secondary | ICD-10-CM

## 2019-05-30 DIAGNOSIS — E876 Hypokalemia: Secondary | ICD-10-CM

## 2019-05-30 DIAGNOSIS — D649 Anemia, unspecified: Secondary | ICD-10-CM

## 2019-05-30 DIAGNOSIS — K2970 Gastritis, unspecified, without bleeding: Secondary | ICD-10-CM

## 2019-05-30 DIAGNOSIS — G629 Polyneuropathy, unspecified: Secondary | ICD-10-CM | POA: Diagnosis not present

## 2019-05-30 DIAGNOSIS — F32A Depression, unspecified: Secondary | ICD-10-CM

## 2019-05-30 DIAGNOSIS — J449 Chronic obstructive pulmonary disease, unspecified: Secondary | ICD-10-CM | POA: Diagnosis not present

## 2019-05-30 DIAGNOSIS — W19XXXA Unspecified fall, initial encounter: Secondary | ICD-10-CM

## 2019-05-30 DIAGNOSIS — I10 Essential (primary) hypertension: Secondary | ICD-10-CM | POA: Diagnosis not present

## 2019-05-30 NOTE — Progress Notes (Signed)
Patient ID: [name], female   DOB: 1946/01/22, 74 y.o.   MRN: [id number]   Subjective:    Patient ID: [name], female    DOB: 06/20/1945, 74 y.o.   MRN: [id number]  HPI This visit occurred during the SARS-CoV-2 public health emergency.  Safety protocols were in place, including screening questions prior to the visit, additional usage of staff PPE, and extensive cleaning of exam room while observing appropriate contact time as indicated for disinfecting solutions.  Patient here for a scheduled follow up.  She reports she is doing relatively well.  Tries to stay active.  No chest pain.  Breathing stable.  No acid reflux reported.  No abdominal pain.  Bowels moving.  On gabapentin and cymbalta.  [place] two months ago.  Fell - putting on pants.  Hit head.  No residual headache.  No significant dizziness.  Increased stress.  Discussed.  Saw Dr Bary Castilla for right breast mastitis.  He reviewed mammogram 09/2018.  Recommended f/u mammogram 09/2019.     Past Medical History:  Diagnosis Date  . Anemia    takes iron supplements  . Anxiety   . Arthritis   . Breast mass, right 12/2017  . Burning feet syndrome   . Chronic insomnia   . Complication of anesthesia    "AFFECTS MY MEMORY"  . COPD (chronic obstructive pulmonary disease) ([place])    documented in outside records. patient denies this diagnosis  . Depression   . Dysrhythmia    PALPITATIONS when anxious  . Edema    MILD ANKLE  . GERD (gastroesophageal reflux disease)   . Heart murmur    current small sign of heart murmur  . History of stomach ulcers   . HOH (hard of hearing)    AIDS but doesn't always wear them  . Hypercholesterolemia   . Hyperparathyroidism, primary ([place])   . Hypertension   . Neuropathy    arms, feet, legs, neck  . Osteoporosis   . Palpitations   . Scoliosis   . Shortness of breath    with exertion  . Tingling    both hands   Past Surgical History:  Procedure Laterality Date  . ANTERIOR CERVICAL  DECOMP/DISCECTOMY FUSION  10/28/2011   Procedure: ANTERIOR CERVICAL DECOMPRESSION/DISCECTOMY FUSION 2 LEVELS;  Surgeon: Elaina Hoops, MD;  Location: [facility];  Service: Neurosurgery;  Laterality: N/A;  . BREAST BIOPSY Right 12/17/2017   Procedure: BREAST BIOPSY;  Surgeon: Robert Bellow, MD;  Location: [facility];  Service: General;  Laterality: Right;  . BREAST EXCISIONAL BIOPSY Right 1992   neg  . BREAST LUMPECTOMY Right 12/2017   Benign  . BREAST SURGERY     RT BREAST CYST REMOVED 15 YRS AGO   . CATARACT EXTRACTION W/PHACO Right 10/30/2014   Procedure: CATARACT EXTRACTION PHACO AND INTRAOCULAR LENS PLACEMENT (IOC);  Surgeon: Birder Robson, MD;  Location: [facility];  Service: Ophthalmology;  Laterality: Right;  US0:40 AP18.2 CDE7.42  [place] [id number]  . CATARACT EXTRACTION W/PHACO Left 11/27/2014   Procedure: CATARACT EXTRACTION PHACO AND INTRAOCULAR LENS PLACEMENT (IOC);  Surgeon: Birder Robson, MD;  Location: [facility];  Service: Ophthalmology;  Laterality: Left;  [place] 00:36.9 AP%: 18.9 CDE: 6.96  [place] #  [id number]  . CYSTOCELE REPAIR N/A 05/13/2015   Procedure: ANTERIOR REPAIR (CYSTOCELE);  Surgeon: Brayton Mars, MD;  Location: [facility];  Service: Gynecology;  Laterality: N/A;  . CYSTOSCOPY  05/13/2015   Procedure: CYSTOSCOPY;  Surgeon: Alanda Slim Defrancesco,  MD;  Location: [facility];  Service: Gynecology;;  . dental implant    . DILATION AND CURETTAGE OF UTERUS     X2   . EYE SURGERY Bilateral    cataract extractions  . JOINT REPLACEMENT     RT HIP  2013  (PARTIAL)   . LAPAROSCOPY  2005   bowel obstruction  . PARATHYROIDECTOMY Left 08/25/2012   Procedure: PARATHYROIDECTOMY;  Surgeon: Earnstine Regal, MD;  Location: [facility];  Service: General;  Laterality: Left;  . PITUITARY SURGERY     FOR TUMOR 3 YRS   . REFRACTIVE SURGERY    . TOTAL KNEE ARTHROPLASTY Right 07/18/2014   Procedure: TOTAL KNEE ARTHROPLASTY;  Surgeon: Dereck Leep, MD;  Location: [facility];   Service: Orthopedics;  Laterality: Right;  Marland Kitchen VAGINAL HYSTERECTOMY Bilateral 05/13/2015   Procedure: HYSTERECTOMY VAGINAL WITH BILATERAL SALPINGO OOPHERECTOMY;  Surgeon: Brayton Mars, MD;  Location: [facility];  Service: Gynecology;  Laterality: Bilateral;  . VAGINAL HYSTERECTOMY     bso ant repair   Family History  Problem Relation Age of Onset  . Glaucoma Mother   . Hypertension Mother   . Thyroid disease Mother   . Stroke Father   . Hypertension Father   . Bipolar disorder Sister   . Breast cancer Paternal Aunt        60's  . Cancer Neg Hx   . Diabetes Neg Hx    Social History   Socioeconomic History  . Marital status: Divorced    Spouse name: Not on file  . Number of children: 1  . Years of education: Not on file  . Highest education level: Some college, no degree  Occupational History  . 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    Employer: [employer]    Comment: retired  Tobacco Use  . Smoking status: Former Smoker    Packs/day: 1.00    Years: 35.00    Pack years: 35.00    Types: Cigarettes    Quit date: 02/10/2008    Years since quitting: 11.3  . Smokeless tobacco: Never Used  Substance and Sexual Activity  . Alcohol use: Yes    Alcohol/week: 0.0 standard drinks    Comment: rare, like holidays  . Drug use: No  . Sexual activity: Never    Birth control/protection: Post-menopausal  Other Topics Concern  . Not on file  Social History Narrative  . Not on file   Social Determinants of Health   Financial Resource Strain: Low Risk   . Difficulty of Paying Living Expenses: Not hard at all  Food Insecurity: No Food Insecurity  . Worried About Charity fundraiser in the Last Year: Never true  . Ran Out of Food in the Last Year: Never true  Transportation Needs: No Transportation Needs  . Lack of Transportation (Medical): No  . Lack of Transportation (Non-Medical): No  Physical Activity: Sufficiently Active  . Days of Exercise per  Week: 7 days  . Minutes of Exercise per Session: 50 min  Stress:   . Feeling of Stress :   Social Connections:   . Frequency of Communication with Friends and Family:   . Frequency of Social Gatherings with Friends and Family:   . Attends Religious Services:   . Active Member of Clubs or Organizations:   . Attends Archivist Meetings:   Marland Kitchen Marital Status:     Outpatient Encounter Medications as of 05/30/2019  Medication Sig  . acetaminophen (TYLENOL) 500  MG tablet Take 1,000 mg by mouth every 6 (six) hours as needed for moderate pain, fever or headache.   . Alpha-Lipoic Acid 600 MG CAPS Take 600 mg by mouth daily.   Marland Kitchen amLODipine (NORVASC) 5 MG tablet Take 1 tablet (5 mg total) by mouth daily.  Marland Kitchen aspirin EC 81 MG tablet Take 81 mg by mouth 4 (four) times a week.   . Biotin w/ Vitamins C & E (HAIR/SKIN/NAILS PO) Take 1 tablet by mouth daily.  . cholecalciferol (VITAMIN D) 1000 UNITS tablet Take 1,000 Units by mouth daily.   Marland Kitchen docusate sodium (COLACE) 100 MG capsule Take 100 mg by mouth daily.   . ferrous sulfate 325 (65 FE) MG tablet Take 325 mg by mouth daily.  Marland Kitchen gabapentin (NEURONTIN) 300 MG capsule Take 600 mg by mouth at bedtime.   . magnesium oxide (MAG-OX) 400 MG tablet Take 400 mg by mouth daily.  Marland Kitchen MEGARED OMEGA-3 KRILL OIL PO Take 1 capsule by mouth at bedtime.  Vladimir Faster Glycol-Propyl Glycol (SYSTANE) 0.4-0.3 % SOLN Place 1 drop into both eyes 2 (two) times daily as needed (for dry eyes).  . Probiotic Product (FLORAJEN3 PO) Take 1 capsule by mouth daily.   Marland Kitchen senna-docusate (SENOKOT-S) 8.6-50 MG tablet Take 1 tablet by mouth at bedtime as needed for mild constipation.  . [DISCONTINUED] atorvastatin (LIPITOR) 40 MG tablet TAKE ONE TABLET EVERY DAY  . [DISCONTINUED] chlorthalidone (HYGROTON) 25 MG tablet TAKE 1 TABLET BY MOUTH DAILY  . [DISCONTINUED] losartan (COZAAR) 100 MG tablet TAKE ONE TABLET BY MOUTH EVERY DAY  . [DISCONTINUED] pantoprazole (PROTONIX) 40 MG tablet  TAKE ONE TABLET TWICE DAILY BEFORE A MEAL  . [DISCONTINUED] potassium chloride (KLOR-CON) 10 MEQ tablet TAKE ONE TABLET (10 MEQ) BY MOUTH TWICE DAILY   No facility-administered encounter medications on file as of 05/30/2019.    Review of Systems  Constitutional: Negative for appetite change and unexpected weight change.  HENT: Negative for congestion and sinus pressure.   Respiratory: Negative for cough, chest tightness and shortness of breath.   Cardiovascular: Negative for chest pain, palpitations and leg swelling.  Gastrointestinal: Negative for abdominal pain, diarrhea, nausea and vomiting.  Genitourinary: Negative for difficulty urinating and dysuria.  Musculoskeletal: Negative for joint swelling and myalgias.  Skin: Negative for color change and rash.  Neurological: Negative for dizziness, light-headedness and headaches.  Psychiatric/Behavioral: Negative for agitation and dysphoric mood.  All other systems reviewed and are negative.      Objective:    Physical Exam Vitals reviewed.  Constitutional:      General: She is not in acute distress.    Appearance: Normal appearance.  HENT:     Head: Normocephalic and atraumatic.     Right Ear: External ear normal.     Left Ear: External ear normal.  Eyes:     General:        Right eye: No discharge.        Left eye: No discharge.     Conjunctiva/sclera: Conjunctivae normal.  Neck:     Thyroid: No thyromegaly.  Cardiovascular:     Rate and Rhythm: Normal rate and regular rhythm.  Pulmonary:     Effort: No respiratory distress.     Breath sounds: Normal breath sounds. No wheezing.  Abdominal:     General: Bowel sounds are normal.     Palpations: Abdomen is soft.     Tenderness: There is no abdominal tenderness.  Musculoskeletal:  General: No swelling or tenderness.     Cervical back: Neck supple. No tenderness.  Lymphadenopathy:     Cervical: No cervical adenopathy.  Skin:    Findings: No erythema or rash.   Neurological:     Mental Status: She is alert.  Psychiatric:        Mood and Affect: Mood normal.        Behavior: Behavior normal.     BP 138/82   Pulse 76   Temp (!) 97.2 F (36.2 C)   Resp 16   Ht 5\' 6"  (1.676 m)   Wt 147 lb 3.2 oz (66.8 kg)   SpO2 97%   BMI 23.76 kg/m  Wt Readings from Last 3 Encounters:  05/30/19 147 lb 3.2 oz (66.8 kg)  02/13/19 144 lb (65.3 kg)  02/13/19 144 lb (65.3 kg)     Lab Results  Component Value Date   WBC 5.6 06/28/2018   HGB 15.0 06/28/2018   HCT 44.3 06/28/2018   PLT 274.0 06/28/2018   GLUCOSE 99 05/25/2019   CHOL 186 05/25/2019   TRIG 97.0 05/25/2019   HDL 68.70 05/25/2019   LDLDIRECT 81.0 01/10/2019   LDLCALC 98 05/25/2019   ALT 22 05/25/2019   AST 22 05/25/2019   NA 141 05/25/2019   K 3.4 (L) 05/25/2019   CL 104 05/25/2019   CREATININE 0.64 05/25/2019   BUN 22 05/25/2019   CO2 30 05/25/2019   TSH 1.12 06/28/2018   INR 0.91 07/05/2014   HGBA1C 5.8 05/26/2016    MM DIAG BREAST TOMO UNI RIGHT  Result Date: 10/04/2018 CLINICAL DATA:  Two areas of possible asymmetry in the medial right breast in the craniocaudal projection of a recent screening mammogram. EXAM: DIGITAL DIAGNOSTIC UNILATERAL RIGHT MAMMOGRAM WITH CAD AND TOMO COMPARISON:  Previous exam(s). ACR Breast Density Category c: The breast tissue is heterogeneously dense, which may obscure small masses. FINDINGS: 3D tomographic and 2D generated true lateral and spot compression craniocaudal views of the right breast were obtained. These demonstrate normal appearing fibroglandular tissue at the locations of the recently suspected asymmetries. Mammographic images were processed with CAD. IMPRESSION: No evidence of malignancy. The recently suspected right breast asymmetries or areas of close apposition of normal breast tissue. RECOMMENDATION: Bilateral screening mammogram in 1 year. I have discussed the findings and recommendations with the patient. Results were also provided  in writing at the conclusion of the visit. If applicable, a reminder letter will be sent to the patient regarding the next appointment. BI-RADS CATEGORY  1: Negative. Electronically Signed   By: Claudie Revering M.D.   On: 10/04/2018 14:38       Assessment & Plan:   Problem List Items Addressed This Visit    Anemia    Follow cbc.  Last hgb check - wnl.       Anxiety    Has seen therapist.  On cymbalta for neuropathy.  Discussed.  Does not feel needs any further intervention at this time.  Follow.        COPD (chronic obstructive pulmonary disease) ([facility])    Breathing stable.        Fall    No residual problems.  Fall two months ago.  Discussed slow position changes, movements and support.  Follow.        Gastritis    Stable on current regimen.        Hypercholesterolemia    On lipitor.  Low cholesterol diet and exercise.  Follow lipid panel and  liver function tests.        Hypertension    Blood pressure doing better.  Continue chlorthalidone, losartan and amlodipine.  Follow pressures.  Follow metabolic panel.       Mild depression ([place])    Has seen therapist.  Feels stable.  On cymbalta for neuropathy.  Follow.        Neuropathy    Seeing neurology.  On gabapentin.  Follow.        Pituitary adenoma ([facility])    Saw Dr Honor Junes.  Recommended f/u MRI 01/2020.         Other Visit Diagnoses    Hypokalemia    -  Primary   Relevant Orders   Potassium       Einar Pheasant, MD

## 2019-06-01 ENCOUNTER — Telehealth: Payer: Self-pay | Admitting: Internal Medicine

## 2019-06-01 NOTE — Telephone Encounter (Signed)
[facility] mail order pharmacy called stating they faxed over a refill order on 05/18/2019 with no response . The patient is needing her medication refills sent to [facility] mail order fax # [phone]    atorvastatin (LIPITOR) 40 MG tablet  chlorthalidone (HYGROTON) 25 MG tablet  pantoprazole (PROTONIX) 40 MG tablet Delayed release  potassium chloride (KLOR-CON) 10 MEQ tablet  losartan (COZAAR) 100 MG tablet

## 2019-06-02 ENCOUNTER — Other Ambulatory Visit: Payer: Self-pay

## 2019-06-02 MED ORDER — POTASSIUM CHLORIDE CRYS ER 10 MEQ PO TBCR: EXTENDED_RELEASE_TABLET | ORAL | 1 refills | Status: DC

## 2019-06-02 MED ORDER — CHLORTHALIDONE 25 MG PO TABS: 25.0000 mg | ORAL_TABLET | Freq: Every day | ORAL | 1 refills | Status: DC

## 2019-06-02 MED ORDER — LOSARTAN POTASSIUM 100 MG PO TABS: 100.0000 mg | ORAL_TABLET | Freq: Every day | ORAL | 1 refills | Status: DC

## 2019-06-02 MED ORDER — PANTOPRAZOLE SODIUM 40 MG PO TBEC: DELAYED_RELEASE_TABLET | ORAL | 1 refills | Status: DC

## 2019-06-02 MED ORDER — ATORVASTATIN CALCIUM 40 MG PO TABS: 40.0000 mg | ORAL_TABLET | Freq: Every day | ORAL | 1 refills | Status: DC

## 2019-06-02 NOTE — Telephone Encounter (Signed)
Prescriptions sent to [facility]

## 2019-06-04 ENCOUNTER — Encounter: Payer: Self-pay | Admitting: Internal Medicine

## 2019-06-04 DIAGNOSIS — W19XXXA Unspecified fall, initial encounter: Secondary | ICD-10-CM | POA: Insufficient documentation

## 2019-06-04 NOTE — Assessment & Plan Note (Signed)
Blood pressure doing better.  Continue chlorthalidone, losartan and amlodipine.  Follow pressures.  Follow metabolic panel.

## 2019-06-04 NOTE — Assessment & Plan Note (Signed)
Has seen therapist.  On cymbalta for neuropathy.  Discussed.  Does not feel needs any further intervention at this time.  Follow.

## 2019-06-04 NOTE — Assessment & Plan Note (Signed)
On lipitor.  Low cholesterol diet and exercise.  Follow lipid panel and liver function tests.   

## 2019-06-04 NOTE — Assessment & Plan Note (Signed)
No residual problems.  Fall two months ago.  Discussed slow position changes, movements and support.  Follow.

## 2019-06-04 NOTE — Assessment & Plan Note (Signed)
Breathing stable.

## 2019-06-04 NOTE — Assessment & Plan Note (Signed)
Seeing neurology.  On gabapentin.  Follow.  

## 2019-06-04 NOTE — Assessment & Plan Note (Signed)
Stable on current regimen   

## 2019-06-04 NOTE — Assessment & Plan Note (Signed)
Has seen therapist.  Feels stable.  On cymbalta for neuropathy.  Follow.

## 2019-06-04 NOTE — Assessment & Plan Note (Signed)
Saw Dr O'Connell.  Recommended f/u MRI 01/2020.  

## 2019-06-04 NOTE — Assessment & Plan Note (Signed)
Follow cbc.  Last hgb check - wnl.

## 2019-06-06 ENCOUNTER — Other Ambulatory Visit: Payer: Self-pay | Admitting: Internal Medicine

## 2019-06-06 ENCOUNTER — Other Ambulatory Visit: Payer: Self-pay

## 2019-06-06 ENCOUNTER — Other Ambulatory Visit (INDEPENDENT_AMBULATORY_CARE_PROVIDER_SITE_OTHER): Payer: Medicare HMO

## 2019-06-06 DIAGNOSIS — E876 Hypokalemia: Secondary | ICD-10-CM

## 2019-06-06 LAB — POTASSIUM: Potassium: 3.6 mEq/L (ref 3.5–5.1)

## 2019-06-06 NOTE — Progress Notes (Signed)
Order placed for f/u potassium check.  

## 2019-06-07 ENCOUNTER — Telehealth: Payer: Self-pay

## 2019-06-07 NOTE — Telephone Encounter (Signed)
error 

## 2019-06-15 ENCOUNTER — Ambulatory Visit (INDEPENDENT_AMBULATORY_CARE_PROVIDER_SITE_OTHER): Payer: Medicare HMO | Admitting: Psychology

## 2019-06-15 DIAGNOSIS — F3289 Other specified depressive episodes: Secondary | ICD-10-CM

## 2019-06-20 DIAGNOSIS — G608 Other hereditary and idiopathic neuropathies: Secondary | ICD-10-CM | POA: Diagnosis not present

## 2019-06-27 ENCOUNTER — Other Ambulatory Visit (INDEPENDENT_AMBULATORY_CARE_PROVIDER_SITE_OTHER): Payer: Medicare HMO

## 2019-06-27 ENCOUNTER — Other Ambulatory Visit: Payer: Self-pay

## 2019-06-27 DIAGNOSIS — E876 Hypokalemia: Secondary | ICD-10-CM

## 2019-06-27 LAB — POTASSIUM: Potassium: 3.4 mEq/L — ABNORMAL LOW (ref 3.5–5.1)

## 2019-06-28 ENCOUNTER — Other Ambulatory Visit: Payer: Self-pay | Admitting: Internal Medicine

## 2019-06-28 DIAGNOSIS — E876 Hypokalemia: Secondary | ICD-10-CM

## 2019-06-28 NOTE — Progress Notes (Signed)
Order placed for f/u potassium check.  

## 2019-07-12 ENCOUNTER — Other Ambulatory Visit (INDEPENDENT_AMBULATORY_CARE_PROVIDER_SITE_OTHER): Payer: Medicare HMO

## 2019-07-12 ENCOUNTER — Other Ambulatory Visit: Payer: Self-pay

## 2019-07-12 DIAGNOSIS — E876 Hypokalemia: Secondary | ICD-10-CM

## 2019-07-12 LAB — POTASSIUM: Potassium: 3.9 mEq/L (ref 3.5–5.1)

## 2019-07-13 ENCOUNTER — Other Ambulatory Visit: Payer: Self-pay | Admitting: Internal Medicine

## 2019-07-13 DIAGNOSIS — E876 Hypokalemia: Secondary | ICD-10-CM

## 2019-07-13 NOTE — Progress Notes (Signed)
Order placed for potassium f/u lab

## 2019-07-18 ENCOUNTER — Other Ambulatory Visit: Payer: Self-pay | Admitting: Internal Medicine

## 2019-07-25 ENCOUNTER — Other Ambulatory Visit: Payer: Medicare HMO

## 2019-08-03 ENCOUNTER — Other Ambulatory Visit (INDEPENDENT_AMBULATORY_CARE_PROVIDER_SITE_OTHER): Payer: Medicare HMO

## 2019-08-03 ENCOUNTER — Other Ambulatory Visit: Payer: Self-pay

## 2019-08-03 DIAGNOSIS — E876 Hypokalemia: Secondary | ICD-10-CM | POA: Diagnosis not present

## 2019-08-03 LAB — POTASSIUM: Potassium: 3.2 mEq/L — ABNORMAL LOW (ref 3.5–5.1)

## 2019-08-04 ENCOUNTER — Telehealth: Payer: Self-pay

## 2019-08-04 NOTE — Telephone Encounter (Signed)
LMTCB in regards to lab results.  

## 2019-08-08 ENCOUNTER — Ambulatory Visit: Payer: Medicare HMO | Admitting: Internal Medicine

## 2019-08-10 ENCOUNTER — Ambulatory Visit (INDEPENDENT_AMBULATORY_CARE_PROVIDER_SITE_OTHER): Payer: Medicare HMO | Admitting: Psychology

## 2019-08-10 DIAGNOSIS — F3289 Other specified depressive episodes: Secondary | ICD-10-CM

## 2019-08-11 ENCOUNTER — Other Ambulatory Visit: Payer: Self-pay

## 2019-08-11 ENCOUNTER — Encounter: Payer: Self-pay | Admitting: Internal Medicine

## 2019-08-11 ENCOUNTER — Ambulatory Visit (INDEPENDENT_AMBULATORY_CARE_PROVIDER_SITE_OTHER): Payer: Medicare HMO | Admitting: Internal Medicine

## 2019-08-11 VITALS — BP 134/86 | HR 72 | Temp 97.9°F | Ht 66.0 in | Wt 147.0 lb

## 2019-08-11 DIAGNOSIS — E210 Primary hyperparathyroidism: Secondary | ICD-10-CM

## 2019-08-11 DIAGNOSIS — J449 Chronic obstructive pulmonary disease, unspecified: Secondary | ICD-10-CM

## 2019-08-11 DIAGNOSIS — I10 Essential (primary) hypertension: Secondary | ICD-10-CM | POA: Diagnosis not present

## 2019-08-11 DIAGNOSIS — M5440 Lumbago with sciatica, unspecified side: Secondary | ICD-10-CM

## 2019-08-11 DIAGNOSIS — E7800 Pure hypercholesterolemia, unspecified: Secondary | ICD-10-CM | POA: Diagnosis not present

## 2019-08-11 DIAGNOSIS — G629 Polyneuropathy, unspecified: Secondary | ICD-10-CM

## 2019-08-11 DIAGNOSIS — F320 Major depressive disorder, single episode, mild: Secondary | ICD-10-CM | POA: Diagnosis not present

## 2019-08-11 DIAGNOSIS — F32A Depression, unspecified: Secondary | ICD-10-CM

## 2019-08-11 DIAGNOSIS — I83813 Varicose veins of bilateral lower extremities with pain: Secondary | ICD-10-CM | POA: Diagnosis not present

## 2019-08-11 DIAGNOSIS — D649 Anemia, unspecified: Secondary | ICD-10-CM

## 2019-08-11 DIAGNOSIS — D352 Benign neoplasm of pituitary gland: Secondary | ICD-10-CM | POA: Diagnosis not present

## 2019-08-11 DIAGNOSIS — F419 Anxiety disorder, unspecified: Secondary | ICD-10-CM

## 2019-08-11 LAB — CBC WITH DIFFERENTIAL/PLATELET
Basophils Absolute: 0.1 10*3/uL (ref 0.0–0.1)
Basophils Relative: 1.3 % (ref 0.0–3.0)
Eosinophils Absolute: 0.2 10*3/uL (ref 0.0–0.7)
Eosinophils Relative: 2.7 % (ref 0.0–5.0)
HCT: 41.3 % (ref 36.0–46.0)
Hemoglobin: 13.9 g/dL (ref 12.0–15.0)
Lymphocytes Relative: 20.7 % (ref 12.0–46.0)
Lymphs Abs: 1.4 10*3/uL (ref 0.7–4.0)
MCHC: 33.6 g/dL (ref 30.0–36.0)
MCV: 98.9 fl (ref 78.0–100.0)
Monocytes Absolute: 0.9 10*3/uL (ref 0.1–1.0)
Monocytes Relative: 13 % — ABNORMAL HIGH (ref 3.0–12.0)
Neutro Abs: 4.1 10*3/uL (ref 1.4–7.7)
Neutrophils Relative %: 62.3 % (ref 43.0–77.0)
Platelets: 266 10*3/uL (ref 150.0–400.0)
RBC: 4.18 Mil/uL (ref 3.87–5.11)
RDW: 12.7 % (ref 11.5–15.5)
WBC: 6.6 10*3/uL (ref 4.0–10.5)

## 2019-08-11 LAB — BASIC METABOLIC PANEL
BUN: 19 mg/dL (ref 6–23)
CO2: 33 mEq/L — ABNORMAL HIGH (ref 19–32)
Calcium: 9.9 mg/dL (ref 8.4–10.5)
Chloride: 101 mEq/L (ref 96–112)
Creatinine, Ser: 0.57 mg/dL (ref 0.40–1.20)
GFR: 104.15 mL/min (ref >60.00)
Glucose, Bld: 95 mg/dL (ref 70–99)
Potassium: 3.5 mEq/L (ref 3.5–5.1)
Sodium: 141 mEq/L (ref 135–145)

## 2019-08-11 LAB — TSH: TSH: 0.5 u[IU]/mL (ref 0.35–4.50)

## 2019-08-11 NOTE — Progress Notes (Signed)
Patient ID: [name], female   DOB: [birth year], 74 y.o.   MRN: [id number]   Subjective:    Patient ID: [name], female    DOB: [birth year], 75 y.o.   MRN: [id number]  HPI This visit occurred during the SARS-CoV-2 public health emergency.  Safety protocols were in place, including screening questions prior to the visit, additional usage of staff PPE, and extensive cleaning of exam room while observing appropriate contact time as indicated for disinfecting solutions.  Patient here for a scheduled follow up.  She has seen neurology for follow up of peripheral neuropathy.  On gabapentin and cymbalta.  Just recently had cymbalta dose increased.  Stable.  Has seen Dr Cram//Dr Sharlet Salina - for her back pain.  Stable.  Seeing psychiatry.  Increased stress.  Discussed with her.  Using melatonin to help her sleep.  No chest pain or sob reported.  Some increased heart rate and palpitations at times.  Relates to increased stress.  Discussed with her today.  Eating.  No abdominal pain or cramping.  Bowels stable.  Some lower extremity swelling.  Worse as day progresses.  Discussed compression hose.     Past Medical History:  Diagnosis Date  . Anemia    takes iron supplements  . Anxiety   . Arthritis   . Breast mass, right 12/2017  . Burning feet syndrome   . Chronic insomnia   . Complication of anesthesia    "AFFECTS MY MEMORY"  . COPD (chronic obstructive pulmonary disease) ([place])    documented in outside records. patient denies this diagnosis  . Depression   . Dysrhythmia    PALPITATIONS when anxious  . Edema    MILD ANKLE  . GERD (gastroesophageal reflux disease)   . Heart murmur    current small sign of heart murmur  . History of stomach ulcers   . HOH (hard of hearing)    AIDS but doesn't always wear them  . Hypercholesterolemia   . Hyperparathyroidism, primary ([place])   . Hypertension   . Neuropathy    arms, feet, legs, neck  . Osteoporosis   . Palpitations   . Scoliosis    . Shortness of breath    with exertion  . Tingling    both hands   Past Surgical History:  Procedure Laterality Date  . ANTERIOR CERVICAL DECOMP/DISCECTOMY FUSION  10/28/2011   Procedure: ANTERIOR CERVICAL DECOMPRESSION/DISCECTOMY FUSION 2 LEVELS;  Surgeon: Elaina Hoops, MD;  Location: [facility];  Service: Neurosurgery;  Laterality: N/A;  . BREAST BIOPSY Right 12/17/2017   Procedure: BREAST BIOPSY;  Surgeon: Robert Bellow, MD;  Location: [facility];  Service: General;  Laterality: Right;  . BREAST EXCISIONAL BIOPSY Right 1992   neg  . BREAST LUMPECTOMY Right 12/2017   Benign  . BREAST SURGERY     RT BREAST CYST REMOVED 15 YRS AGO   . CATARACT EXTRACTION W/PHACO Right 10/30/2014   Procedure: CATARACT EXTRACTION PHACO AND INTRAOCULAR LENS PLACEMENT (IOC);  Surgeon: Birder Robson, MD;  Location: [facility];  Service: Ophthalmology;  Laterality: Right;  US0:40 AP18.2 CDE7.42  [place] lot #[id number]  . CATARACT EXTRACTION W/PHACO Left 11/27/2014   Procedure: CATARACT EXTRACTION PHACO AND INTRAOCULAR LENS PLACEMENT (IOC);  Surgeon: Birder Robson, MD;  Location: [facility];  Service: Ophthalmology;  Laterality: Left;  [place] 00:36.9 AP%: 18.9 CDE: 6.96  [place] lot #  [id number]  . CYSTOCELE REPAIR N/A 05/13/2015   Procedure: ANTERIOR REPAIR (CYSTOCELE);  Surgeon: Brayton Mars, MD;  Location: [facility];  Service: Gynecology;  Laterality: N/A;  . CYSTOSCOPY  05/13/2015   Procedure: CYSTOSCOPY;  Surgeon: Brayton Mars, MD;  Location: [facility];  Service: Gynecology;;  . dental implant    . DILATION AND CURETTAGE OF UTERUS     X2   . EYE SURGERY Bilateral    cataract extractions  . JOINT REPLACEMENT     RT HIP  2013  (PARTIAL)   . LAPAROSCOPY  2005   bowel obstruction  . PARATHYROIDECTOMY Left 08/25/2012   Procedure: PARATHYROIDECTOMY;  Surgeon: Earnstine Regal, MD;  Location: [facility];  Service: General;  Laterality: Left;  . PITUITARY SURGERY     FOR TUMOR 3 YRS   .  REFRACTIVE SURGERY    . TOTAL KNEE ARTHROPLASTY Right 07/18/2014   Procedure: TOTAL KNEE ARTHROPLASTY;  Surgeon: Dereck Leep, MD;  Location: [facility];  Service: Orthopedics;  Laterality: Right;  Marland Kitchen VAGINAL HYSTERECTOMY Bilateral 05/13/2015   Procedure: HYSTERECTOMY VAGINAL WITH BILATERAL SALPINGO OOPHERECTOMY;  Surgeon: Brayton Mars, MD;  Location: [facility];  Service: Gynecology;  Laterality: Bilateral;  . VAGINAL HYSTERECTOMY     bso ant repair   Family History  Problem Relation Age of Onset  . Glaucoma Mother   . Hypertension Mother   . Thyroid disease Mother   . Stroke Father   . Hypertension Father   . Bipolar disorder Sister   . Breast cancer Paternal Aunt        60's  . Cancer Neg Hx   . Diabetes Neg Hx    Social History   Socioeconomic History  . Marital status: Divorced    Spouse name: Not on file  . Number of children: 1  . Years of education: Not on file  . Highest education level: Some college, no degree  Occupational History  . 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    Employer: [employer]    Comment: retired  Tobacco Use  . Smoking status: Former Smoker    Packs/day: 1.00    Years: 35.00    Pack years: 35.00    Types: Cigarettes    Quit date: 02/10/2008    Years since quitting: 11.5  . Smokeless tobacco: Never Used  Vaping Use  . Vaping Use: Never used  Substance and Sexual Activity  . Alcohol use: Yes    Alcohol/week: 0.0 standard drinks    Comment: rare, like holidays  . Drug use: No  . Sexual activity: Never    Birth control/protection: Post-menopausal  Other Topics Concern  . Not on file  Social History Narrative  . Not on file   Social Determinants of Health   Financial Resource Strain: Low Risk   . Difficulty of Paying Living Expenses: Not hard at all  Food Insecurity: No Food Insecurity  . Worried About Charity fundraiser in the Last Year: Never true  . Ran Out of Food in the Last Year: Never true   Transportation Needs: No Transportation Needs  . Lack of Transportation (Medical): No  . Lack of Transportation (Non-Medical): No  Physical Activity: Sufficiently Active  . Days of Exercise per Week: 7 days  . Minutes of Exercise per Session: 50 min  Stress:   . Feeling of Stress :   Social Connections:   . Frequency of Communication with Friends and Family:   . Frequency of Social Gatherings with Friends and Family:   . Attends Religious Services:   . Active Member of Clubs or  Organizations:   . Attends Archivist Meetings:   Marland Kitchen Marital Status:     Outpatient Encounter Medications as of 08/11/2019  Medication Sig  . acetaminophen (TYLENOL) 500 MG tablet Take 1,000 mg by mouth every 6 (six) hours as needed for moderate pain, fever or headache.   Marland Kitchen amLODipine (NORVASC) 5 MG tablet Take 1 tablet (5 mg total) by mouth daily.  Marland Kitchen aspirin EC 81 MG tablet Take 81 mg by mouth 4 (four) times a week.   Marland Kitchen atorvastatin (LIPITOR) 40 MG tablet Take 1 tablet (40 mg total) by mouth daily.  . Biotin w/ Vitamins C & E (HAIR/SKIN/NAILS PO) Take 1 tablet by mouth daily.  . chlorthalidone (HYGROTON) 25 MG tablet Take 1 tablet (25 mg total) by mouth daily.  . cholecalciferol (VITAMIN D) 1000 UNITS tablet Take 1,000 Units by mouth daily.   Marland Kitchen docusate sodium (COLACE) 100 MG capsule Take 100 mg by mouth daily.   . ferrous sulfate 325 (65 FE) MG tablet Take 325 mg by mouth daily.  Marland Kitchen gabapentin (NEURONTIN) 300 MG capsule Take 600 mg by mouth at bedtime.   Marland Kitchen losartan (COZAAR) 100 MG tablet TAKE ONE TABLET EVERY DAY  . magnesium oxide (MAG-OX) 400 MG tablet Take 400 mg by mouth daily.  Marland Kitchen MEGARED OMEGA-3 KRILL OIL PO Take 1 capsule by mouth at bedtime.  . pantoprazole (PROTONIX) 40 MG tablet TAKE ONE TABLET TWICE DAILY BEFORE A MEAL  . Polyethyl Glycol-Propyl Glycol (SYSTANE) 0.4-0.3 % SOLN Place 1 drop into both eyes 2 (two) times daily as needed (for dry eyes).  . potassium chloride (KLOR-CON) 10 MEQ  tablet TAKE ONE TABLET (10 MEQ) BY MOUTH TWICE DAILY  . Probiotic Product (FLORAJEN3 PO) Take 1 capsule by mouth daily.   Marland Kitchen senna-docusate (SENOKOT-S) 8.6-50 MG tablet Take 1 tablet by mouth at bedtime as needed for mild constipation.  . [DISCONTINUED] Alpha-Lipoic Acid 600 MG CAPS Take 600 mg by mouth daily.    No facility-administered encounter medications on file as of 08/11/2019.    Review of Systems     Objective:    Physical Exam  BP 134/86   Pulse 72   Temp 97.9 F (36.6 C) (Oral)   Ht 5\' 6"  (1.676 m)   Wt 147 lb (66.7 kg)   SpO2 96%   BMI 23.73 kg/m  Wt Readings from Last 3 Encounters:  08/11/19 147 lb (66.7 kg)  05/30/19 147 lb 3.2 oz (66.8 kg)  02/13/19 144 lb (65.3 kg)     Lab Results  Component Value Date   WBC 6.6 08/11/2019   HGB 13.9 08/11/2019   HCT 41.3 08/11/2019   PLT 266.0 08/11/2019   GLUCOSE 95 08/11/2019   CHOL 186 05/25/2019   TRIG 97.0 05/25/2019   HDL 68.70 05/25/2019   LDLDIRECT 81.0 01/10/2019   LDLCALC 98 05/25/2019   ALT 22 05/25/2019   AST 22 05/25/2019   NA 141 08/11/2019   K 3.5 08/11/2019   CL 101 08/11/2019   CREATININE 0.57 08/11/2019   BUN 19 08/11/2019   CO2 33 (H) 08/11/2019   TSH 0.50 08/11/2019   INR 0.91 07/05/2014   HGBA1C 5.8 05/26/2016    MM DIAG BREAST TOMO UNI RIGHT  Result Date: 10/04/2018 CLINICAL DATA:  Two areas of possible asymmetry in the medial right breast in the craniocaudal projection of a recent screening mammogram. EXAM: DIGITAL DIAGNOSTIC UNILATERAL RIGHT MAMMOGRAM WITH CAD AND TOMO COMPARISON:  Previous exam(s). ACR Breast Density Category c:  The breast tissue is heterogeneously dense, which may obscure small masses. FINDINGS: 3D tomographic and 2D generated true lateral and spot compression craniocaudal views of the right breast were obtained. These demonstrate normal appearing fibroglandular tissue at the locations of the recently suspected asymmetries. Mammographic images were processed with CAD.  IMPRESSION: No evidence of malignancy. The recently suspected right breast asymmetries or areas of close apposition of normal breast tissue. RECOMMENDATION: Bilateral screening mammogram in 1 year. I have discussed the findings and recommendations with the patient. Results were also provided in writing at the conclusion of the visit. If applicable, a reminder letter will be sent to the patient regarding the next appointment. BI-RADS CATEGORY  1: Negative. Electronically Signed   By: Claudie Revering M.D.   On: 10/04/2018 14:38       Assessment & Plan:   Problem List Items Addressed This Visit    Anemia    Follow cbc.       Anxiety    Seeing a therapist.  On cymbalta.  Follow.        COPD (chronic obstructive pulmonary disease) ([facility])    Breathing stable.        Hypercholesterolemia    On lipitor.  Follow lipid panel and liver function tests.  Low cholesterol diet and exercise.        Hyperparathyroidism, primary ([place])    S/p surgery.  Calcium has been normal.  Follow.        Hypertension - Primary    Blood pressure on recheck 130-134/84-86.  Continue chlorthalidone, losartan and amlodipine.  Follow pressures.  Hold on making adjustment in medication.  Follow metabolic panel.       Relevant Orders   CBC with Differential/Platelet (Completed)   TSH (Completed)   Basic metabolic panel (Completed)   Low back pain    Has seen Dr Saintclair Halsted and Dr Sharlet Salina.  Seeing neurology now.  Overall stable.  Follow.        Mild depression ([place])    Seeing a therapist.  On cymbalta for neuropathy.  Does just increased.  Follow.  Overall appears to be stable.        Neuropathy    Seeing neurology.  Off alpha lipoic acid.  On gabapentin.  Follow.        Pituitary adenoma ([facility])    Saw Dr Honor Junes.  Recommended f/u MRI 01/2020.       Varicose veins of both lower extremities with pain    Has seen vascular surgery. Discussed again compression hose.  Follow.            Einar Pheasant, MD

## 2019-08-13 ENCOUNTER — Encounter: Payer: Self-pay | Admitting: Internal Medicine

## 2019-08-13 NOTE — Assessment & Plan Note (Signed)
Has seen Dr Saintclair Halsted and Dr Sharlet Salina.  Seeing neurology now.  Overall stable.  Follow.

## 2019-08-13 NOTE — Assessment & Plan Note (Signed)
Breathing stable.

## 2019-08-13 NOTE — Assessment & Plan Note (Signed)
Seeing a therapist.  On cymbalta for neuropathy.  Does just increased.  Follow.  Overall appears to be stable.

## 2019-08-13 NOTE — Assessment & Plan Note (Signed)
Follow cbc.  

## 2019-08-13 NOTE — Assessment & Plan Note (Signed)
Saw Dr Honor Junes.  Recommended f/u MRI 01/2020.

## 2019-08-13 NOTE — Assessment & Plan Note (Signed)
Blood pressure on recheck 130-134/84-86.  Continue chlorthalidone, losartan and amlodipine.  Follow pressures.  Hold on making adjustment in medication.  Follow metabolic panel.

## 2019-08-13 NOTE — Assessment & Plan Note (Signed)
On lipitor.  Follow lipid panel and liver function tests.  Low cholesterol diet and exercise   

## 2019-08-13 NOTE — Assessment & Plan Note (Signed)
Seeing neurology.  Off alpha lipoic acid.  On gabapentin.  Follow.

## 2019-08-13 NOTE — Assessment & Plan Note (Signed)
Seeing a therapist.  On cymbalta.  Follow.

## 2019-08-13 NOTE — Assessment & Plan Note (Signed)
Has seen vascular surgery. Discussed again compression hose.  Follow.

## 2019-08-13 NOTE — Assessment & Plan Note (Signed)
S/p surgery. Calcium has been normal.  Follow.   

## 2019-08-15 ENCOUNTER — Other Ambulatory Visit: Payer: Self-pay | Admitting: Internal Medicine

## 2019-08-15 DIAGNOSIS — E876 Hypokalemia: Secondary | ICD-10-CM

## 2019-08-15 NOTE — Progress Notes (Signed)
Order placed for f/u potassium lab.  

## 2019-09-07 DIAGNOSIS — D352 Benign neoplasm of pituitary gland: Secondary | ICD-10-CM | POA: Diagnosis not present

## 2019-09-08 DIAGNOSIS — Z85828 Personal history of other malignant neoplasm of skin: Secondary | ICD-10-CM | POA: Diagnosis not present

## 2019-09-08 DIAGNOSIS — L821 Other seborrheic keratosis: Secondary | ICD-10-CM | POA: Diagnosis not present

## 2019-09-08 DIAGNOSIS — D2261 Melanocytic nevi of right upper limb, including shoulder: Secondary | ICD-10-CM | POA: Diagnosis not present

## 2019-09-08 DIAGNOSIS — C44712 Basal cell carcinoma of skin of right lower limb, including hip: Secondary | ICD-10-CM | POA: Diagnosis not present

## 2019-09-08 DIAGNOSIS — D2262 Melanocytic nevi of left upper limb, including shoulder: Secondary | ICD-10-CM | POA: Diagnosis not present

## 2019-09-08 DIAGNOSIS — D225 Melanocytic nevi of trunk: Secondary | ICD-10-CM | POA: Diagnosis not present

## 2019-09-08 DIAGNOSIS — R202 Paresthesia of skin: Secondary | ICD-10-CM | POA: Diagnosis not present

## 2019-09-08 DIAGNOSIS — D2271 Melanocytic nevi of right lower limb, including hip: Secondary | ICD-10-CM | POA: Diagnosis not present

## 2019-09-08 DIAGNOSIS — L570 Actinic keratosis: Secondary | ICD-10-CM | POA: Diagnosis not present

## 2019-09-08 DIAGNOSIS — D485 Neoplasm of uncertain behavior of skin: Secondary | ICD-10-CM | POA: Diagnosis not present

## 2019-09-12 ENCOUNTER — Other Ambulatory Visit: Payer: Self-pay

## 2019-09-12 ENCOUNTER — Other Ambulatory Visit (INDEPENDENT_AMBULATORY_CARE_PROVIDER_SITE_OTHER): Payer: Medicare HMO

## 2019-09-12 DIAGNOSIS — E876 Hypokalemia: Secondary | ICD-10-CM

## 2019-09-12 LAB — POTASSIUM: Potassium: 3.3 mEq/L — ABNORMAL LOW (ref 3.5–5.1)

## 2019-09-13 ENCOUNTER — Ambulatory Visit: Payer: Medicare HMO | Admitting: Psychology

## 2019-09-19 ENCOUNTER — Ambulatory Visit: Payer: Medicare HMO | Admitting: Internal Medicine

## 2019-09-26 ENCOUNTER — Telehealth: Payer: Self-pay | Admitting: Internal Medicine

## 2019-09-26 ENCOUNTER — Ambulatory Visit: Payer: Medicare HMO | Admitting: Internal Medicine

## 2019-09-26 DIAGNOSIS — Z0289 Encounter for other administrative examinations: Secondary | ICD-10-CM

## 2019-09-26 NOTE — Telephone Encounter (Signed)
Attempted to call patient to reschedule appt.

## 2019-09-26 NOTE — Telephone Encounter (Signed)
Pt needs to reschedule appt for blood pressure follow up. There are no appts available until the end of October and she states that she needs to be seen sooner than that. Please advise

## 2019-09-28 NOTE — Telephone Encounter (Signed)
Patient called in to reschedule app for bp check scheduled her for 10-17-19 at 2p

## 2019-09-28 NOTE — Telephone Encounter (Signed)
Noted  

## 2019-09-29 DIAGNOSIS — C44712 Basal cell carcinoma of skin of right lower limb, including hip: Secondary | ICD-10-CM | POA: Diagnosis not present

## 2019-09-29 DIAGNOSIS — L570 Actinic keratosis: Secondary | ICD-10-CM | POA: Diagnosis not present

## 2019-10-17 ENCOUNTER — Ambulatory Visit (INDEPENDENT_AMBULATORY_CARE_PROVIDER_SITE_OTHER): Payer: Medicare HMO | Admitting: Internal Medicine

## 2019-10-17 ENCOUNTER — Other Ambulatory Visit: Payer: Self-pay

## 2019-10-17 DIAGNOSIS — Z1231 Encounter for screening mammogram for malignant neoplasm of breast: Secondary | ICD-10-CM

## 2019-10-17 DIAGNOSIS — R0789 Other chest pain: Secondary | ICD-10-CM | POA: Diagnosis not present

## 2019-10-17 DIAGNOSIS — D352 Benign neoplasm of pituitary gland: Secondary | ICD-10-CM | POA: Diagnosis not present

## 2019-10-17 DIAGNOSIS — F320 Major depressive disorder, single episode, mild: Secondary | ICD-10-CM

## 2019-10-17 DIAGNOSIS — F32A Depression, unspecified: Secondary | ICD-10-CM

## 2019-10-17 DIAGNOSIS — D649 Anemia, unspecified: Secondary | ICD-10-CM

## 2019-10-17 DIAGNOSIS — R200 Anesthesia of skin: Secondary | ICD-10-CM | POA: Diagnosis not present

## 2019-10-17 DIAGNOSIS — R202 Paresthesia of skin: Secondary | ICD-10-CM

## 2019-10-17 DIAGNOSIS — E210 Primary hyperparathyroidism: Secondary | ICD-10-CM | POA: Diagnosis not present

## 2019-10-17 DIAGNOSIS — E876 Hypokalemia: Secondary | ICD-10-CM

## 2019-10-17 DIAGNOSIS — Z23 Encounter for immunization: Secondary | ICD-10-CM

## 2019-10-17 DIAGNOSIS — I10 Essential (primary) hypertension: Secondary | ICD-10-CM

## 2019-10-17 DIAGNOSIS — E7800 Pure hypercholesterolemia, unspecified: Secondary | ICD-10-CM

## 2019-10-17 NOTE — Progress Notes (Signed)
Patient ID: [name], female   DOB: 1945-03-02, 74 y.o.   MRN: [id number]   Subjective:    Patient ID: [name], female    DOB: 29-Nov-1945, 74 y.o.   MRN: [id number]  HPI This visit occurred during the SARS-CoV-2 public health emergency.  Safety protocols were in place, including screening questions prior to the visit, additional usage of staff PPE, and extensive cleaning of exam room while observing appropriate contact time as indicated for disinfecting solutions.  Patient here for a scheduled follow up.  Here to f/u regarding her blood pressure.  She is now taking 1/2 chlorthalidone.  Has been having problems with low potassium.  Breathing stable.  No abdominal pain.  Eating and drinking.  Bowels moving.  She is having persistent issues with numbness in her hands.  Had nerve conduction studies previously.  Per neurology, bilateral carpal tunnel syndrome.  Given increased problems, discussed referral to ortho.  She also saw endocrinology recently for f/u pituitary.  Has been stable.  Recommended f/u MRI 01/2020.  States occasionally will notice some chest tightness.  No increased cough or congestion.     Past Medical History:  Diagnosis Date  . Anemia    takes iron supplements  . Anxiety   . Arthritis   . Breast mass, right 12/2017  . Burning feet syndrome   . Chronic insomnia   . Complication of anesthesia    "AFFECTS MY MEMORY"  . COPD (chronic obstructive pulmonary disease) ([place])    documented in outside records. patient denies this diagnosis  . Depression   . Dysrhythmia    PALPITATIONS when anxious  . Edema    MILD ANKLE  . GERD (gastroesophageal reflux disease)   . Heart murmur    current small sign of heart murmur  . History of stomach ulcers   . HOH (hard of hearing)    AIDS but doesn't always wear them  . Hypercholesterolemia   . Hyperparathyroidism, primary ([place])   . Hypertension   . Neuropathy    arms, feet, legs, neck  . Osteoporosis   . Palpitations    . Scoliosis   . Shortness of breath    with exertion  . Tingling    both hands   Past Surgical History:  Procedure Laterality Date  . ANTERIOR CERVICAL DECOMP/DISCECTOMY FUSION  10/28/2011   Procedure: ANTERIOR CERVICAL DECOMPRESSION/DISCECTOMY FUSION 2 LEVELS;  Surgeon: Elaina Hoops, MD;  Location: [facility];  Service: Neurosurgery;  Laterality: N/A;  . BREAST BIOPSY Right 12/17/2017   Procedure: BREAST BIOPSY;  Surgeon: Robert Bellow, MD;  Location: [facility];  Service: General;  Laterality: Right;  . BREAST EXCISIONAL BIOPSY Right 1992   neg  . BREAST LUMPECTOMY Right 12/2017   Benign  . BREAST SURGERY     RT BREAST CYST REMOVED 15 YRS AGO   . CATARACT EXTRACTION W/PHACO Right 10/30/2014   Procedure: CATARACT EXTRACTION PHACO AND INTRAOCULAR LENS PLACEMENT (IOC);  Surgeon: Birder Robson, MD;  Location: [facility];  Service: Ophthalmology;  Laterality: Right;  US0:40 AP18.2 CDE7.42  [place] #[id number]  . CATARACT EXTRACTION W/PHACO Left 11/27/2014   Procedure: CATARACT EXTRACTION PHACO AND INTRAOCULAR LENS PLACEMENT (IOC);  Surgeon: Birder Robson, MD;  Location: [facility];  Service: Ophthalmology;  Laterality: Left;  [place] 00:36.9 AP%: 18.9 CDE: 6.96  [place] #  [id number]  . CYSTOCELE REPAIR N/A 05/13/2015   Procedure: ANTERIOR REPAIR (CYSTOCELE);  Surgeon: Brayton Mars, MD;  Location: [facility];  Service: Gynecology;  Laterality: N/A;  . CYSTOSCOPY  05/13/2015   Procedure: CYSTOSCOPY;  Surgeon: Brayton Mars, MD;  Location: [facility];  Service: Gynecology;;  . dental implant    . DILATION AND CURETTAGE OF UTERUS     X2   . EYE SURGERY Bilateral    cataract extractions  . JOINT REPLACEMENT     RT HIP  2013  (PARTIAL)   . LAPAROSCOPY  2005   bowel obstruction  . PARATHYROIDECTOMY Left 08/25/2012   Procedure: PARATHYROIDECTOMY;  Surgeon: Earnstine Regal, MD;  Location: [facility];  Service: General;  Laterality: Left;  . PITUITARY SURGERY     FOR TUMOR 3  YRS   . REFRACTIVE SURGERY    . TOTAL KNEE ARTHROPLASTY Right 07/18/2014   Procedure: TOTAL KNEE ARTHROPLASTY;  Surgeon: Dereck Leep, MD;  Location: [facility];  Service: Orthopedics;  Laterality: Right;  Marland Kitchen VAGINAL HYSTERECTOMY Bilateral 05/13/2015   Procedure: HYSTERECTOMY VAGINAL WITH BILATERAL SALPINGO OOPHERECTOMY;  Surgeon: Brayton Mars, MD;  Location: [facility];  Service: Gynecology;  Laterality: Bilateral;  . VAGINAL HYSTERECTOMY     bso ant repair   Family History  Problem Relation Age of Onset  . Glaucoma Mother   . Hypertension Mother   . Thyroid disease Mother   . Stroke Father   . Hypertension Father   . Bipolar disorder Sister   . Breast cancer Paternal Aunt        60's  . Cancer Neg Hx   . Diabetes Neg Hx    Social History   Socioeconomic History  . Marital status: Divorced    Spouse name: Not on file  . Number of children: 1  . Years of education: Not on file  . Highest education level: Some college, no degree  Occupational History  . 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    Employer: [employer]    Comment: retired  Tobacco Use  . Smoking status: Former Smoker    Packs/day: 1.00    Years: 35.00    Pack years: 35.00    Types: Cigarettes    Quit date: 02/10/2008    Years since quitting: 11.7  . Smokeless tobacco: Never Used  Vaping Use  . Vaping Use: Never used  Substance and Sexual Activity  . Alcohol use: Yes    Alcohol/week: 0.0 standard drinks    Comment: rare, like holidays  . Drug use: No  . Sexual activity: Never    Birth control/protection: Post-menopausal  Other Topics Concern  . Not on file  Social History Narrative  . Not on file   Social Determinants of Health   Financial Resource Strain: Low Risk   . Difficulty of Paying Living Expenses: Not hard at all  Food Insecurity: No Food Insecurity  . Worried About Charity fundraiser in the Last Year: Never true  . Ran Out of Food in the Last Year: Never true   Transportation Needs: No Transportation Needs  . Lack of Transportation (Medical): No  . Lack of Transportation (Non-Medical): No  Physical Activity: Sufficiently Active  . Days of Exercise per Week: 7 days  . Minutes of Exercise per Session: 50 min  Stress:   . Feeling of Stress : Not on file  Social Connections:   . Frequency of Communication with Friends and Family: Not on file  . Frequency of Social Gatherings with Friends and Family: Not on file  . Attends Religious Services: Not on file  . Active  Member of Clubs or Organizations: Not on file  . Attends Archivist Meetings: Not on file  . Marital Status: Not on file    Outpatient Encounter Medications as of 10/17/2019  Medication Sig  . acetaminophen (TYLENOL) 500 MG tablet Take 1,000 mg by mouth every 6 (six) hours as needed for moderate pain, fever or headache.   Marland Kitchen amLODipine (NORVASC) 5 MG tablet Take 1 tablet (5 mg total) by mouth daily.  Marland Kitchen aspirin EC 81 MG tablet Take 81 mg by mouth 4 (four) times a week.   Marland Kitchen atorvastatin (LIPITOR) 40 MG tablet Take 1 tablet (40 mg total) by mouth daily.  . Biotin w/ Vitamins C & E (HAIR/SKIN/NAILS PO) Take 1 tablet by mouth daily.  . chlorthalidone (HYGROTON) 25 MG tablet Take 1 tablet (25 mg total) by mouth daily.  . cholecalciferol (VITAMIN D) 1000 UNITS tablet Take 1,000 Units by mouth daily.   Marland Kitchen docusate sodium (COLACE) 100 MG capsule Take 100 mg by mouth daily.   . ferrous sulfate 325 (65 FE) MG tablet Take 325 mg by mouth daily.  Marland Kitchen gabapentin (NEURONTIN) 300 MG capsule Take 600 mg by mouth at bedtime.   Marland Kitchen losartan (COZAAR) 100 MG tablet TAKE ONE TABLET EVERY DAY  . magnesium oxide (MAG-OX) 400 MG tablet Take 400 mg by mouth daily.  Marland Kitchen MEGARED OMEGA-3 KRILL OIL PO Take 1 capsule by mouth at bedtime.  . pantoprazole (PROTONIX) 40 MG tablet TAKE ONE TABLET TWICE DAILY BEFORE A MEAL  . Polyethyl Glycol-Propyl Glycol (SYSTANE) 0.4-0.3 % SOLN Place 1 drop into both eyes 2 (two)  times daily as needed (for dry eyes).  . potassium chloride (KLOR-CON) 10 MEQ tablet TAKE ONE TABLET (10 MEQ) BY MOUTH TWICE DAILY  . Probiotic Product (FLORAJEN3 PO) Take 1 capsule by mouth daily.   Marland Kitchen senna-docusate (SENOKOT-S) 8.6-50 MG tablet Take 1 tablet by mouth at bedtime as needed for mild constipation.   No facility-administered encounter medications on file as of 10/17/2019.   Review of Systems  Constitutional: Negative for appetite change and unexpected weight change.  HENT: Negative for congestion and sinus pressure.   Respiratory: Positive for chest tightness. Negative for cough and shortness of breath.   Cardiovascular: Negative for palpitations and leg swelling.  Gastrointestinal: Negative for abdominal pain, diarrhea, nausea and vomiting.  Genitourinary: Negative for difficulty urinating and dysuria.  Musculoskeletal: Negative for joint swelling and myalgias.  Skin: Negative for color change and rash.  Neurological: Negative for dizziness, light-headedness and headaches.  Psychiatric/Behavioral: Negative for agitation and dysphoric mood.       Increased stress as outlined.        Objective:    Physical Exam Vitals reviewed.  Constitutional:      General: She is not in acute distress.    Appearance: Normal appearance.  HENT:     Head: Normocephalic and atraumatic.     Right Ear: External ear normal.     Left Ear: External ear normal.  Eyes:     General: No scleral icterus.       Right eye: No discharge.        Left eye: No discharge.     Conjunctiva/sclera: Conjunctivae normal.  Neck:     Thyroid: No thyromegaly.  Cardiovascular:     Rate and Rhythm: Normal rate and regular rhythm.  Pulmonary:     Effort: No respiratory distress.     Breath sounds: Normal breath sounds. No wheezing.     Comments:  Breasts:  No nipple discharge or nipple retraction present.  Well healed incision site.  No distinct nodules or axillary adenopathy.  Abdominal:     General: Bowel  sounds are normal.     Palpations: Abdomen is soft.     Tenderness: There is no abdominal tenderness.  Musculoskeletal:        General: No swelling or tenderness.     Cervical back: Neck supple. No tenderness.  Lymphadenopathy:     Cervical: No cervical adenopathy.  Skin:    Findings: No erythema or rash.  Neurological:     Mental Status: She is alert.  Psychiatric:        Mood and Affect: Mood normal.        Behavior: Behavior normal.     BP (!) 144/90   Pulse 65   Temp 98.1 F (36.7 C) (Oral)   Resp 16   Ht 5\' 6"  (1.676 m)   Wt 145 lb 6.4 oz (66 kg)   SpO2 98%   BMI 23.47 kg/m  Wt Readings from Last 3 Encounters:  10/17/19 145 lb 6.4 oz (66 kg)  08/11/19 147 lb (66.7 kg)  05/30/19 147 lb 3.2 oz (66.8 kg)     Lab Results  Component Value Date   WBC 6.6 08/11/2019   HGB 13.9 08/11/2019   HCT 41.3 08/11/2019   PLT 266.0 08/11/2019   GLUCOSE 87 10/17/2019   CHOL 186 05/25/2019   TRIG 97.0 05/25/2019   HDL 68.70 05/25/2019   LDLDIRECT 81.0 01/10/2019   LDLCALC 98 05/25/2019   ALT 22 05/25/2019   AST 22 05/25/2019   NA 140 10/17/2019   K 3.9 10/17/2019   CL 99 10/17/2019   CREATININE 0.60 10/17/2019   BUN 18 10/17/2019   CO2 32 10/17/2019   TSH 0.50 08/11/2019   INR 0.91 07/05/2014   HGBA1C 5.8 05/26/2016    MM DIAG BREAST TOMO UNI RIGHT  Result Date: 10/04/2018 CLINICAL DATA:  Two areas of possible asymmetry in the medial right breast in the craniocaudal projection of a recent screening mammogram. EXAM: DIGITAL DIAGNOSTIC UNILATERAL RIGHT MAMMOGRAM WITH CAD AND TOMO COMPARISON:  Previous exam(s). ACR Breast Density Category c: The breast tissue is heterogeneously dense, which may obscure small masses. FINDINGS: 3D tomographic and 2D generated true lateral and spot compression craniocaudal views of the right breast were obtained. These demonstrate normal appearing fibroglandular tissue at the locations of the recently suspected asymmetries. Mammographic  images were processed with CAD. IMPRESSION: No evidence of malignancy. The recently suspected right breast asymmetries or areas of close apposition of normal breast tissue. RECOMMENDATION: Bilateral screening mammogram in 1 year. I have discussed the findings and recommendations with the patient. Results were also provided in writing at the conclusion of the visit. If applicable, a reminder letter will be sent to the patient regarding the next appointment. BI-RADS CATEGORY  1: Negative. Electronically Signed   By: Claudie Revering M.D.   On: 10/04/2018 14:38       Assessment & Plan:   Problem List Items Addressed This Visit    Pituitary adenoma [facility])    Seeing endocrinology.  Stable.  Recommended f/u MRI in 01/2020.        Numbness and tingling in both hands    Has seen neurology.  Per neurology, NCS - bilateral carpal tunnel syndrome.  Persistent increased issues.  Refer to ortho for further evaluation.       Relevant Orders   Ambulatory referral to Orthopedic Surgery  Mild depression ([place])    Has seen therapist.  Follow. Appears to be stable.      Hypokalemia    Recheck potassium today. On 1/2 chlorthalidone. Follow.       Relevant Orders   Basic metabolic panel (Completed)   Hypertension    Blood pressure on recheck improved. Continue current medication regimen.  Follow pressures.  Follow metabolic panel.       Hyperparathyroidism, primary ([place])    S/p surgery. Calcium has been normal.  Follow.        Hypercholesterolemia    On lipitor.  Low cholesterol diet and exercise.  Follow lipid panel and liver function tests.       Chest tightness    Noticed some chest tightness.  Given symptoms and risk factors, EKG - SR/SB with ventricular rate 53.  No acute ischemic changes.  Discussed further cardiac evaluation.  She is in agreement.       Relevant Orders   EKG 12-Lead   Ambulatory referral to Cardiology   Breast cancer screening    Due mammogram.        Relevant Orders    MM 3D SCREEN BREAST BILATERAL   Anemia    Follow cbc.        Other Visit Diagnoses    Need for immunization against influenza       Relevant Orders   Flu Vaccine QUAD High Dose(Fluad) (Completed)       Einar Pheasant, MD

## 2019-10-18 LAB — BASIC METABOLIC PANEL
BUN: 18 mg/dL (ref 6–23)
CO2: 32 mEq/L (ref 19–32)
Calcium: 10.3 mg/dL (ref 8.4–10.5)
Chloride: 99 mEq/L (ref 96–112)
Creatinine, Ser: 0.6 mg/dL (ref 0.40–1.20)
GFR: 98.11 mL/min (ref >60.00)
Glucose, Bld: 87 mg/dL (ref 70–99)
Potassium: 3.9 mEq/L (ref 3.5–5.1)
Sodium: 140 mEq/L (ref 135–145)

## 2019-10-19 ENCOUNTER — Other Ambulatory Visit: Payer: Self-pay | Admitting: Internal Medicine

## 2019-10-19 ENCOUNTER — Other Ambulatory Visit: Payer: Self-pay

## 2019-10-19 DIAGNOSIS — E876 Hypokalemia: Secondary | ICD-10-CM

## 2019-10-19 NOTE — Progress Notes (Signed)
Order placed for f/u potassium check.  

## 2019-10-22 ENCOUNTER — Encounter: Payer: Self-pay | Admitting: Internal Medicine

## 2019-10-22 DIAGNOSIS — Z1239 Encounter for other screening for malignant neoplasm of breast: Secondary | ICD-10-CM | POA: Insufficient documentation

## 2019-10-22 NOTE — Assessment & Plan Note (Signed)
Has seen therapist.  Follow. Appears to be stable.

## 2019-10-22 NOTE — Assessment & Plan Note (Signed)
S/p surgery. Calcium has been normal.  Follow.

## 2019-10-22 NOTE — Assessment & Plan Note (Signed)
Noticed some chest tightness.  Given symptoms and risk factors, EKG - SR/SB with ventricular rate 53.  No acute ischemic changes.  Discussed further cardiac evaluation.  She is in agreement.

## 2019-10-22 NOTE — Assessment & Plan Note (Signed)
On lipitor.  Low cholesterol diet and exercise.  Follow lipid panel and liver function tests.   

## 2019-10-22 NOTE — Assessment & Plan Note (Signed)
Has seen neurology.  Per neurology, NCS - bilateral carpal tunnel syndrome.  Persistent increased issues.  Refer to ortho for further evaluation.

## 2019-10-22 NOTE — Assessment & Plan Note (Signed)
Due mammogram.

## 2019-10-22 NOTE — Assessment & Plan Note (Signed)
Blood pressure on recheck improved.  Continue current medication regimen.  Follow pressures.  Follow metabolic panel.   

## 2019-10-22 NOTE — Assessment & Plan Note (Signed)
Seeing endocrinology.  Stable.  Recommended f/u MRI in 01/2020.

## 2019-10-22 NOTE — Assessment & Plan Note (Signed)
Follow cbc.  

## 2019-10-22 NOTE — Assessment & Plan Note (Signed)
Recheck potassium today. On 1/2 chlorthalidone. Follow.

## 2019-10-27 ENCOUNTER — Telehealth: Payer: Self-pay | Admitting: Internal Medicine

## 2019-10-27 NOTE — Telephone Encounter (Signed)
Rejection Reason - Patient did not respond - We have contacted this patient 2 times, left 2 messages and mailed a letter. This patient has not contacted [place] back to schedule. Referral is being closed due to time sensitivity but pt has our info to call and schedule. We will still be happy to assist your office and the patient. Thank you!" [facility] - [place] said 13 minutes ago

## 2019-10-30 ENCOUNTER — Other Ambulatory Visit: Payer: Self-pay

## 2019-10-30 ENCOUNTER — Other Ambulatory Visit: Payer: Self-pay | Admitting: Internal Medicine

## 2019-10-30 ENCOUNTER — Other Ambulatory Visit (INDEPENDENT_AMBULATORY_CARE_PROVIDER_SITE_OTHER): Payer: Medicare HMO

## 2019-10-30 DIAGNOSIS — D352 Benign neoplasm of pituitary gland: Secondary | ICD-10-CM | POA: Diagnosis not present

## 2019-10-30 DIAGNOSIS — G608 Other hereditary and idiopathic neuropathies: Secondary | ICD-10-CM | POA: Diagnosis not present

## 2019-10-30 DIAGNOSIS — E876 Hypokalemia: Secondary | ICD-10-CM | POA: Diagnosis not present

## 2019-10-30 DIAGNOSIS — G5603 Carpal tunnel syndrome, bilateral upper limbs: Secondary | ICD-10-CM | POA: Diagnosis not present

## 2019-10-31 DIAGNOSIS — I10 Essential (primary) hypertension: Secondary | ICD-10-CM | POA: Diagnosis not present

## 2019-10-31 DIAGNOSIS — R0789 Other chest pain: Secondary | ICD-10-CM | POA: Diagnosis not present

## 2019-10-31 DIAGNOSIS — R0600 Dyspnea, unspecified: Secondary | ICD-10-CM | POA: Diagnosis not present

## 2019-10-31 DIAGNOSIS — E7800 Pure hypercholesterolemia, unspecified: Secondary | ICD-10-CM | POA: Diagnosis not present

## 2019-10-31 DIAGNOSIS — J449 Chronic obstructive pulmonary disease, unspecified: Secondary | ICD-10-CM | POA: Diagnosis not present

## 2019-10-31 LAB — POTASSIUM: Potassium: 3.6 mEq/L (ref 3.5–5.1)

## 2019-11-01 ENCOUNTER — Other Ambulatory Visit: Payer: Self-pay

## 2019-11-01 ENCOUNTER — Other Ambulatory Visit: Payer: Self-pay | Admitting: Internal Medicine

## 2019-11-01 DIAGNOSIS — E876 Hypokalemia: Secondary | ICD-10-CM

## 2019-11-01 MED ORDER — CHLORTHALIDONE 25 MG PO TABS: ORAL_TABLET | ORAL | 1 refills | Status: DC

## 2019-11-01 NOTE — Progress Notes (Signed)
Order placed for f/u potassium check.  

## 2019-11-10 ENCOUNTER — Other Ambulatory Visit: Payer: Self-pay | Admitting: Internal Medicine

## 2019-11-14 ENCOUNTER — Encounter: Payer: Medicare HMO | Admitting: Internal Medicine

## 2019-11-28 ENCOUNTER — Encounter: Payer: Self-pay | Admitting: Internal Medicine

## 2019-12-11 ENCOUNTER — Encounter: Payer: Medicare HMO | Admitting: Internal Medicine

## 2019-12-12 DIAGNOSIS — R0602 Shortness of breath: Secondary | ICD-10-CM | POA: Diagnosis not present

## 2019-12-12 DIAGNOSIS — I10 Essential (primary) hypertension: Secondary | ICD-10-CM | POA: Diagnosis not present

## 2019-12-12 DIAGNOSIS — E7800 Pure hypercholesterolemia, unspecified: Secondary | ICD-10-CM | POA: Diagnosis not present

## 2019-12-12 DIAGNOSIS — J449 Chronic obstructive pulmonary disease, unspecified: Secondary | ICD-10-CM | POA: Diagnosis not present

## 2019-12-22 ENCOUNTER — Ambulatory Visit: Payer: Medicare HMO | Admitting: Internal Medicine

## 2019-12-22 ENCOUNTER — Encounter: Payer: Self-pay | Admitting: Internal Medicine

## 2019-12-30 ENCOUNTER — Ambulatory Visit: Payer: Medicare HMO | Attending: Internal Medicine

## 2019-12-30 DIAGNOSIS — Z23 Encounter for immunization: Secondary | ICD-10-CM

## 2019-12-30 NOTE — Progress Notes (Signed)
   Covid-19 Vaccination Clinic  Name:  [name]    MRN: [id number] DOB: [birth year]  12/30/2019  Ms. Both was observed post Covid-19 immunization for 15 minutes without incident. She was provided with Vaccine Information Sheet and instruction to access the V-Safe system.   [name] was instructed to call 911 with any severe reactions post vaccine: Marland Kitchen Difficulty breathing  . Swelling of face and throat  . A fast heartbeat  . A bad rash all over body  . Dizziness and weakness   Immunizations Administered    Name Date Dose VIS Date Route   Pfizer COVID-19 Vaccine 12/30/2019  1:43 PM 0.3 mL 11/29/2019 Intramuscular   Manufacturer: [place]   Lot: [id number]   [place]: [id number]

## 2020-01-01 ENCOUNTER — Other Ambulatory Visit: Payer: Self-pay | Admitting: Internal Medicine

## 2020-01-02 ENCOUNTER — Telehealth: Payer: Self-pay | Admitting: Internal Medicine

## 2020-01-02 ENCOUNTER — Other Ambulatory Visit: Payer: Self-pay

## 2020-01-02 ENCOUNTER — Ambulatory Visit (INDEPENDENT_AMBULATORY_CARE_PROVIDER_SITE_OTHER): Payer: Medicare HMO | Admitting: Internal Medicine

## 2020-01-02 VITALS — BP 124/76 | HR 67 | Temp 97.6°F | Resp 16 | Ht 66.0 in | Wt 143.4 lb

## 2020-01-02 DIAGNOSIS — Z0000 Encounter for general adult medical examination without abnormal findings: Secondary | ICD-10-CM

## 2020-01-02 DIAGNOSIS — F331 Major depressive disorder, recurrent, moderate: Secondary | ICD-10-CM

## 2020-01-02 DIAGNOSIS — I10 Essential (primary) hypertension: Secondary | ICD-10-CM | POA: Diagnosis not present

## 2020-01-02 DIAGNOSIS — J449 Chronic obstructive pulmonary disease, unspecified: Secondary | ICD-10-CM

## 2020-01-02 DIAGNOSIS — D649 Anemia, unspecified: Secondary | ICD-10-CM

## 2020-01-02 DIAGNOSIS — D352 Benign neoplasm of pituitary gland: Secondary | ICD-10-CM

## 2020-01-02 DIAGNOSIS — E876 Hypokalemia: Secondary | ICD-10-CM

## 2020-01-02 DIAGNOSIS — N63 Unspecified lump in unspecified breast: Secondary | ICD-10-CM

## 2020-01-02 DIAGNOSIS — G629 Polyneuropathy, unspecified: Secondary | ICD-10-CM

## 2020-01-02 DIAGNOSIS — E7800 Pure hypercholesterolemia, unspecified: Secondary | ICD-10-CM

## 2020-01-02 DIAGNOSIS — Z0001 Encounter for general adult medical examination with abnormal findings: Secondary | ICD-10-CM

## 2020-01-02 LAB — HEPATIC FUNCTION PANEL
ALT: 28 U/L (ref 0–35)
AST: 24 U/L (ref 0–37)
Albumin: 4.5 g/dL (ref 3.5–5.2)
Alkaline Phosphatase: 64 U/L (ref 39–117)
Bilirubin, Direct: 0.1 mg/dL (ref 0.0–0.3)
Total Bilirubin: 0.6 mg/dL (ref 0.2–1.2)
Total Protein: 7 g/dL (ref 6.0–8.3)

## 2020-01-02 LAB — BASIC METABOLIC PANEL
BUN: 17 mg/dL (ref 6–23)
CO2: 34 mEq/L — ABNORMAL HIGH (ref 19–32)
Calcium: 10 mg/dL (ref 8.4–10.5)
Chloride: 98 mEq/L (ref 96–112)
Creatinine, Ser: 0.6 mg/dL (ref 0.40–1.20)
GFR: 89.44 mL/min (ref >60.00)
Glucose, Bld: 94 mg/dL (ref 70–99)
Potassium: 3.5 mEq/L (ref 3.5–5.1)
Sodium: 138 mEq/L (ref 135–145)

## 2020-01-02 NOTE — Progress Notes (Signed)
Patient ID: [name], female   DOB: 1945/03/30, 74 y.o.   MRN: [id number]   Subjective:    Patient ID: [name], female    DOB: [birth year], 74 y.o.   MRN: [id number]  HPI This visit occurred during the SARS-CoV-2 public health emergency.  Safety protocols were in place, including screening questions prior to the visit, additional usage of staff PPE, and extensive cleaning of exam room while observing appropriate contact time as indicated for disinfecting solutions.  Patient here for her physical exam.  She reports noticing nodule right breast.  Due screening mammogram.  Nipple has been inverted - unchanged.  Recently evaluated by cardiology for previous report of chest pain.  No evidence of ischemia on ETT.  No significant sob.  No further cardiac w/up felt warranted. Breathing overall stable.  No increased cough or congestion.  No acid reflux.  No abdominal pain.  Bowels moving.  Seeing neurology.  On gabapentin and cymbalta dose recently increased.  Initially reported noticing a gland in her neck.  Unable to definitely palpate today.  Still trying to cope with increased stress.    Past Medical History:  Diagnosis Date  . Anemia    takes iron supplements  . Anxiety   . Arthritis   . Breast mass, right 12/2017  . Burning feet syndrome   . Chronic insomnia   . Complication of anesthesia    "AFFECTS MY MEMORY"  . COPD (chronic obstructive pulmonary disease) ([place])    documented in outside records. patient denies this diagnosis  . Depression   . Dysrhythmia    PALPITATIONS when anxious  . Edema    MILD ANKLE  . GERD (gastroesophageal reflux disease)   . Heart murmur    current small sign of heart murmur  . History of stomach ulcers   . HOH (hard of hearing)    AIDS but doesn't always wear them  . Hypercholesterolemia   . Hyperparathyroidism, primary ([place])   . Hypertension   . Neuropathy    arms, feet, legs, neck  . Osteoporosis   . Palpitations   . Scoliosis   .  Shortness of breath    with exertion  . Tingling    both hands   Past Surgical History:  Procedure Laterality Date  . ANTERIOR CERVICAL DECOMP/DISCECTOMY FUSION  10/28/2011   Procedure: ANTERIOR CERVICAL DECOMPRESSION/DISCECTOMY FUSION 2 LEVELS;  Surgeon: Elaina Hoops, MD;  Location: [facility];  Service: Neurosurgery;  Laterality: N/A;  . BREAST BIOPSY Right 12/17/2017   Procedure: BREAST BIOPSY;  Surgeon: Robert Bellow, MD;  Location: [facility];  Service: General;  Laterality: Right;  . BREAST EXCISIONAL BIOPSY Right 1992   neg  . BREAST LUMPECTOMY Right 12/2017   Benign  . BREAST SURGERY     RT BREAST CYST REMOVED 15 YRS AGO   . CATARACT EXTRACTION W/PHACO Right 10/30/2014   Procedure: CATARACT EXTRACTION PHACO AND INTRAOCULAR LENS PLACEMENT (IOC);  Surgeon: Birder Robson, MD;  Location: [facility];  Service: Ophthalmology;  Laterality: Right;  US0:40 AP18.2 CDE7.42  [place] #[id number]  . CATARACT EXTRACTION W/PHACO Left 11/27/2014   Procedure: CATARACT EXTRACTION PHACO AND INTRAOCULAR LENS PLACEMENT (IOC);  Surgeon: Birder Robson, MD;  Location: [facility];  Service: Ophthalmology;  Laterality: Left;  [place] 00:36.9 AP%: 18.9 CDE: 6.96  [place] #  [id number]  . CYSTOCELE REPAIR N/A 05/13/2015   Procedure: ANTERIOR REPAIR (CYSTOCELE);  Surgeon: Brayton Mars, MD;  Location: [facility];  Service: Gynecology;  Laterality: N/A;  . CYSTOSCOPY  05/13/2015   Procedure: CYSTOSCOPY;  Surgeon: Brayton Mars, MD;  Location: [facility];  Service: Gynecology;;  . dental implant    . DILATION AND CURETTAGE OF UTERUS     X2   . EYE SURGERY Bilateral    cataract extractions  . JOINT REPLACEMENT     RT HIP  2013  (PARTIAL)   . LAPAROSCOPY  2005   bowel obstruction  . PARATHYROIDECTOMY Left 08/25/2012   Procedure: PARATHYROIDECTOMY;  Surgeon: Earnstine Regal, MD;  Location: [facility];  Service: General;  Laterality: Left;  . PITUITARY SURGERY     FOR TUMOR 3 YRS   . REFRACTIVE  SURGERY    . TOTAL KNEE ARTHROPLASTY Right 07/18/2014   Procedure: TOTAL KNEE ARTHROPLASTY;  Surgeon: Dereck Leep, MD;  Location: [facility];  Service: Orthopedics;  Laterality: Right;  Marland Kitchen VAGINAL HYSTERECTOMY Bilateral 05/13/2015   Procedure: HYSTERECTOMY VAGINAL WITH BILATERAL SALPINGO OOPHERECTOMY;  Surgeon: Brayton Mars, MD;  Location: [facility];  Service: Gynecology;  Laterality: Bilateral;  . VAGINAL HYSTERECTOMY     bso ant repair   Family History  Problem Relation Age of Onset  . Glaucoma Mother   . Hypertension Mother   . Thyroid disease Mother   . Stroke Father   . Hypertension Father   . Bipolar disorder Sister   . Breast cancer Paternal Aunt        60's  . Cancer Neg Hx   . Diabetes Neg Hx    Social History   Socioeconomic History  . Marital status: Divorced    Spouse name: Not on file  . Number of children: 1  . Years of education: Not on file  . Highest education level: Some college, no degree  Occupational History  . 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    Employer: [employer]    Comment: retired  Tobacco Use  . Smoking status: Former Smoker    Packs/day: 1.00    Years: 35.00    Pack years: 35.00    Types: Cigarettes    Quit date: 02/10/2008    Years since quitting: 11.9  . Smokeless tobacco: Never Used  Vaping Use  . Vaping Use: Never used  Substance and Sexual Activity  . Alcohol use: Yes    Alcohol/week: 0.0 standard drinks    Comment: rare, like holidays  . Drug use: No  . Sexual activity: Never    Birth control/protection: Post-menopausal  Other Topics Concern  . Not on file  Social History Narrative  . Not on file   Social Determinants of Health   Financial Resource Strain: Low Risk   . Difficulty of Paying Living Expenses: Not hard at all  Food Insecurity: No Food Insecurity  . Worried About Charity fundraiser in the Last Year: Never true  . Ran Out of Food in the Last Year: Never true  Transportation  Needs: No Transportation Needs  . Lack of Transportation (Medical): No  . Lack of Transportation (Non-Medical): No  Physical Activity: Sufficiently Active  . Days of Exercise per Week: 7 days  . Minutes of Exercise per Session: 50 min  Stress:   . Feeling of Stress : Not on file  Social Connections:   . Frequency of Communication with Friends and Family: Not on file  . Frequency of Social Gatherings with Friends and Family: Not on file  . Attends Religious Services: Not on file  . Active Member of Clubs  or Organizations: Not on file  . Attends Archivist Meetings: Not on file  . Marital Status: Not on file    Outpatient Encounter Medications as of 01/02/2020  Medication Sig  . acetaminophen (TYLENOL) 500 MG tablet Take 1,000 mg by mouth every 6 (six) hours as needed for moderate pain, fever or headache.   Marland Kitchen amLODipine (NORVASC) 5 MG tablet Take 1 tablet (5 mg total) by mouth daily.  Marland Kitchen aspirin EC 81 MG tablet Take 81 mg by mouth 4 (four) times a week.   Marland Kitchen atorvastatin (LIPITOR) 40 MG tablet TAKE 1 TABLET (40 MG TOTAL) BY MOUTH DAILY.  Marland Kitchen Biotin w/ Vitamins C & E (HAIR/SKIN/NAILS PO) Take 1 tablet by mouth daily.  . chlorthalidone (HYGROTON) 25 MG tablet TAKE 1 TABLET (25 MG TOTAL) BY MOUTH DAILY.  . cholecalciferol (VITAMIN D) 1000 UNITS tablet Take 1,000 Units by mouth daily.   Marland Kitchen docusate sodium (COLACE) 100 MG capsule Take 100 mg by mouth daily.   . ferrous sulfate 325 (65 FE) MG tablet Take 325 mg by mouth daily.  Marland Kitchen gabapentin (NEURONTIN) 300 MG capsule Take 600 mg by mouth at bedtime.   Marland Kitchen losartan (COZAAR) 100 MG tablet TAKE 1 TABLET (100 MG TOTAL) BY MOUTH DAILY.  . magnesium oxide (MAG-OX) 400 MG tablet Take 400 mg by mouth daily.  Marland Kitchen MEGARED OMEGA-3 KRILL OIL PO Take 1 capsule by mouth at bedtime.  . pantoprazole (PROTONIX) 40 MG tablet TAKE ONE TABLET TWICE DAILY BEFORE MEAL  . Polyethyl Glycol-Propyl Glycol (SYSTANE) 0.4-0.3 % SOLN Place 1 drop into both eyes 2 (two)  times daily as needed (for dry eyes).  . potassium chloride (KLOR-CON) 10 MEQ tablet TAKE ONE TABLET (10 MEQ) BY MOUTH TWICE DAILY  . Probiotic Product (FLORAJEN3 PO) Take 1 capsule by mouth daily.   Marland Kitchen senna-docusate (SENOKOT-S) 8.6-50 MG tablet Take 1 tablet by mouth at bedtime as needed for mild constipation.   No facility-administered encounter medications on file as of 01/02/2020.    Review of Systems  Constitutional: Negative for appetite change and unexpected weight change.  HENT: Negative for congestion, sinus pressure and sore throat.   Eyes: Negative for pain and visual disturbance.  Respiratory: Negative for cough, chest tightness and shortness of breath.   Cardiovascular: Negative for chest pain, palpitations and leg swelling.  Gastrointestinal: Negative for abdominal pain, diarrhea, nausea and vomiting.  Genitourinary: Negative for difficulty urinating and dysuria.  Musculoskeletal: Negative for joint swelling and myalgias.  Skin: Negative for color change and rash.  Neurological: Negative for dizziness, light-headedness and headaches.  Hematological: Negative for adenopathy. Does not bruise/bleed easily.  Psychiatric/Behavioral: Negative for agitation and dysphoric mood.       Increased stress.        Objective:    Physical Exam Vitals reviewed.  Constitutional:      General: She is not in acute distress.    Appearance: Normal appearance.  HENT:     Head: Normocephalic and atraumatic.     Right Ear: External ear normal.     Left Ear: External ear normal.  Eyes:     General: No scleral icterus.       Right eye: No discharge.        Left eye: No discharge.     Conjunctiva/sclera: Conjunctivae normal.  Neck:     Thyroid: No thyromegaly.  Cardiovascular:     Rate and Rhythm: Normal rate and regular rhythm.  Pulmonary:     Effort: No  respiratory distress.     Breath sounds: Normal breath sounds. No wheezing.     Comments: Breasts:  Nipple inversion - right  breast.  Palpable nodule - 11:00 right breast.  Scar - right breast.  No other palpable nodules or axillary adenopathy.   Abdominal:     General: Bowel sounds are normal.     Palpations: Abdomen is soft.     Tenderness: There is no abdominal tenderness.  Musculoskeletal:        General: No swelling or tenderness.     Cervical back: Neck supple.  Lymphadenopathy:     Cervical: No cervical adenopathy.  Skin:    Findings: No erythema or rash.  Neurological:     Mental Status: She is alert.  Psychiatric:        Mood and Affect: Mood normal.        Behavior: Behavior normal.     BP 124/76   Pulse 67   Temp 97.6 F (36.4 C) (Oral)   Resp 16   Ht 5\' 6"  (1.676 m)   Wt 143 lb 6.4 oz (65 kg)   SpO2 99%   BMI 23.15 kg/m  Wt Readings from Last 3 Encounters:  01/02/20 143 lb 6.4 oz (65 kg)  10/17/19 145 lb 6.4 oz (66 kg)  08/11/19 147 lb (66.7 kg)     Lab Results  Component Value Date   WBC 6.6 08/11/2019   HGB 13.9 08/11/2019   HCT 41.3 08/11/2019   PLT 266.0 08/11/2019   GLUCOSE 94 01/02/2020   CHOL 186 05/25/2019   TRIG 97.0 05/25/2019   HDL 68.70 05/25/2019   LDLDIRECT 81.0 01/10/2019   LDLCALC 98 05/25/2019   ALT 28 01/02/2020   AST 24 01/02/2020   NA 138 01/02/2020   K 3.5 01/02/2020   CL 98 01/02/2020   CREATININE 0.60 01/02/2020   BUN 17 01/02/2020   CO2 34 (H) 01/02/2020   TSH 0.50 08/11/2019   INR 0.91 07/05/2014   HGBA1C 5.8 05/26/2016    MM DIAG BREAST TOMO UNI RIGHT  Result Date: 10/04/2018 CLINICAL DATA:  Two areas of possible asymmetry in the medial right breast in the craniocaudal projection of a recent screening mammogram. EXAM: DIGITAL DIAGNOSTIC UNILATERAL RIGHT MAMMOGRAM WITH CAD AND TOMO COMPARISON:  Previous exam(s). ACR Breast Density Category c: The breast tissue is heterogeneously dense, which may obscure small masses. FINDINGS: 3D tomographic and 2D generated true lateral and spot compression craniocaudal views of the right breast were  obtained. These demonstrate normal appearing fibroglandular tissue at the locations of the recently suspected asymmetries. Mammographic images were processed with CAD. IMPRESSION: No evidence of malignancy. The recently suspected right breast asymmetries or areas of close apposition of normal breast tissue. RECOMMENDATION: Bilateral screening mammogram in 1 year. I have discussed the findings and recommendations with the patient. Results were also provided in writing at the conclusion of the visit. If applicable, a reminder letter will be sent to the patient regarding the next appointment. BI-RADS CATEGORY  1: Negative. Electronically Signed   By: Claudie Revering M.D.   On: 10/04/2018 14:38       Assessment & Plan:   Problem List Items Addressed This Visit    Pituitary adenoma [facility])    Being followed by endocrinology.  Stable.  Recommended f/u MRI in 01/2020.        Neuropathy    Followed by neurology. On gabapentin.  Follow.        MDD (major depressive disorder),  recurrent episode, moderate ([place])    Seeing a therapist.  Stable.  Follow.        Hypokalemia    Recheck potassium.  Follow.       Relevant Orders   Basic metabolic panel (Completed)   Hypertension    Blood pressure 138/80 on recheck.  Continue amlodipine, losartan and chlorthalidone.  Follow pressures.  Follow metabolic panel.        Hypercholesterolemia - Primary    On lipitor.  Follow lipid panel and liver function tests.  Low cholesterol diet and exercise.       Relevant Orders   Hepatic function panel (Completed)   Health care maintenance    Physical today 01/02/20.  Schedule mammogram.  Colonoscopy 09/2013.  Recommended f/u colonoscopy in 2025.        COPD (chronic obstructive pulmonary disease) ([facility])    Breathing stable.        Breast nodule    Breast exam as outlined.  Nodule 11:00 right breast.  Schedule diagnostic mammogram and ultrasound.        Relevant Orders   MM DIAG BREAST TOMO BILATERAL   US  BREAST LTD UNI RIGHT INC AXILLA   US BREAST LTD UNI LEFT INC AXILLA   Anemia    Follow [facility].           Einar Pheasant, MD

## 2020-01-02 NOTE — Telephone Encounter (Signed)
lft vm for pt to call ofc to [place] [facility]

## 2020-01-03 ENCOUNTER — Telehealth: Payer: Self-pay | Admitting: Internal Medicine

## 2020-01-03 ENCOUNTER — Encounter: Payer: Self-pay | Admitting: *Deleted

## 2020-01-03 NOTE — Telephone Encounter (Signed)
lft vm for pt to call ofc to sch

## 2020-01-06 ENCOUNTER — Encounter: Payer: Self-pay | Admitting: Internal Medicine

## 2020-01-06 NOTE — Assessment & Plan Note (Signed)
Breathing stable.

## 2020-01-06 NOTE — Assessment & Plan Note (Signed)
Physical today 01/02/20.  Schedule mammogram.  Colonoscopy 09/2013.  Recommended f/u colonoscopy in 2025.

## 2020-01-06 NOTE — Assessment & Plan Note (Signed)
On lipitor.  Follow lipid panel and liver function tests.  Low cholesterol diet and exercise   

## 2020-01-06 NOTE — Assessment & Plan Note (Signed)
Follow cbc.  

## 2020-01-06 NOTE — Assessment & Plan Note (Signed)
Seeing a therapist.  Stable.  Follow.

## 2020-01-06 NOTE — Assessment & Plan Note (Signed)
Followed by neurology. On gabapentin.  Follow.

## 2020-01-06 NOTE — Assessment & Plan Note (Signed)
Recheck potassium.  Follow.

## 2020-01-06 NOTE — Assessment & Plan Note (Signed)
Breast exam as outlined.  Nodule 11:00 right breast.  Schedule diagnostic mammogram and ultrasound.

## 2020-01-06 NOTE — Assessment & Plan Note (Signed)
Blood pressure 138/80 on recheck.  Continue amlodipine, losartan and chlorthalidone.  Follow pressures.  Follow metabolic panel.

## 2020-01-06 NOTE — Assessment & Plan Note (Signed)
Being followed by endocrinology.  Stable.  Recommended f/u MRI in 01/2020.

## 2020-01-15 ENCOUNTER — Ambulatory Visit
Admission: RE | Admit: 2020-01-15 | Discharge: 2020-01-15 | Disposition: A | Discharge status: Home / Self Care | Payer: Medicare HMO | Source: Ambulatory Visit | Attending: Internal Medicine | Admitting: Internal Medicine

## 2020-01-15 ENCOUNTER — Other Ambulatory Visit: Payer: Self-pay

## 2020-01-15 DIAGNOSIS — N6311 Unspecified lump in the right breast, upper outer quadrant: Secondary | ICD-10-CM | POA: Diagnosis not present

## 2020-01-15 DIAGNOSIS — N63 Unspecified lump in unspecified breast: Secondary | ICD-10-CM

## 2020-01-15 DIAGNOSIS — N6310 Unspecified lump in the right breast, unspecified quadrant: Secondary | ICD-10-CM | POA: Diagnosis present

## 2020-01-15 DIAGNOSIS — N6325 Unspecified lump in the left breast, overlapping quadrants: Secondary | ICD-10-CM | POA: Diagnosis not present

## 2020-01-15 DIAGNOSIS — N6489 Other specified disorders of breast: Secondary | ICD-10-CM | POA: Diagnosis not present

## 2020-01-15 DIAGNOSIS — N6321 Unspecified lump in the left breast, upper outer quadrant: Secondary | ICD-10-CM | POA: Diagnosis not present

## 2020-01-15 DIAGNOSIS — R922 Inconclusive mammogram: Secondary | ICD-10-CM | POA: Diagnosis not present

## 2020-01-17 ENCOUNTER — Other Ambulatory Visit: Payer: Self-pay | Admitting: Internal Medicine

## 2020-01-18 ENCOUNTER — Telehealth: Payer: Self-pay | Admitting: *Deleted

## 2020-01-18 ENCOUNTER — Telehealth: Payer: Self-pay | Admitting: Internal Medicine

## 2020-01-18 ENCOUNTER — Other Ambulatory Visit: Payer: Self-pay | Admitting: Internal Medicine

## 2020-01-18 DIAGNOSIS — R928 Other abnormal and inconclusive findings on diagnostic imaging of breast: Secondary | ICD-10-CM

## 2020-01-18 DIAGNOSIS — D508 Other iron deficiency anemias: Secondary | ICD-10-CM | POA: Diagnosis not present

## 2020-01-18 DIAGNOSIS — N63 Unspecified lump in unspecified breast: Secondary | ICD-10-CM | POA: Diagnosis not present

## 2020-01-18 DIAGNOSIS — G629 Polyneuropathy, unspecified: Secondary | ICD-10-CM | POA: Diagnosis not present

## 2020-01-18 DIAGNOSIS — Z780 Asymptomatic menopausal state: Secondary | ICD-10-CM | POA: Diagnosis not present

## 2020-01-18 DIAGNOSIS — N6320 Unspecified lump in the left breast, unspecified quadrant: Secondary | ICD-10-CM

## 2020-01-18 DIAGNOSIS — I10 Essential (primary) hypertension: Secondary | ICD-10-CM | POA: Diagnosis not present

## 2020-01-18 DIAGNOSIS — E782 Mixed hyperlipidemia: Secondary | ICD-10-CM | POA: Diagnosis not present

## 2020-01-18 DIAGNOSIS — D352 Benign neoplasm of pituitary gland: Secondary | ICD-10-CM | POA: Diagnosis not present

## 2020-01-18 NOTE — Telephone Encounter (Signed)
If pt needs a surgical referral after bx results come back, [place] will guide her thru the process to obtain surgical referral - as needed.   Pt was very happy with this plan.

## 2020-01-18 NOTE — Telephone Encounter (Signed)
Pt called to discuss radiologist recommendation for breast biopsy.  Pt adamantly prefers to come to [place] to have bx done by breast specialist/radiologist.  Discussed with pt that I will submit order request and call her asap to schedule.

## 2020-01-18 NOTE — Telephone Encounter (Signed)
-----   Message from Einar Pheasant, MD sent at 01/17/2020 11:55 PM EST ----- Please schedule pt a f/u appt to discuss her mammogram and let me do repeat breast exam (if agreeable).  Thanks.

## 2020-01-18 NOTE — Progress Notes (Signed)
LMTCB and schedule an appt asap

## 2020-01-18 NOTE — Telephone Encounter (Signed)
LMTCB and schedule appt with Dr.Scott ASAP

## 2020-01-22 ENCOUNTER — Ambulatory Visit: Payer: Medicare HMO | Admitting: Internal Medicine

## 2020-01-22 DIAGNOSIS — Z0289 Encounter for other administrative examinations: Secondary | ICD-10-CM

## 2020-01-25 ENCOUNTER — Ambulatory Visit
Admission: RE | Admit: 2020-01-25 | Discharge: 2020-01-25 | Disposition: A | Discharge status: Home / Self Care | Payer: Medicare HMO | Source: Ambulatory Visit | Attending: Internal Medicine | Admitting: Internal Medicine

## 2020-01-25 ENCOUNTER — Other Ambulatory Visit: Payer: Self-pay

## 2020-01-25 DIAGNOSIS — R928 Other abnormal and inconclusive findings on diagnostic imaging of breast: Secondary | ICD-10-CM

## 2020-01-25 DIAGNOSIS — N6320 Unspecified lump in the left breast, unspecified quadrant: Secondary | ICD-10-CM

## 2020-01-25 DIAGNOSIS — N6042 Mammary duct ectasia of left breast: Secondary | ICD-10-CM | POA: Diagnosis not present

## 2020-01-25 DIAGNOSIS — N6342 Unspecified lump in left breast, subareolar: Secondary | ICD-10-CM | POA: Diagnosis not present

## 2020-01-26 LAB — SURGICAL PATHOLOGY

## 2020-02-12 ENCOUNTER — Other Ambulatory Visit: Payer: Self-pay | Admitting: "Endocrinology

## 2020-02-12 DIAGNOSIS — D352 Benign neoplasm of pituitary gland: Secondary | ICD-10-CM

## 2020-02-14 ENCOUNTER — Ambulatory Visit: Payer: Medicare HMO

## 2020-02-16 ENCOUNTER — Telehealth (HOSPITAL_COMMUNITY): Payer: Self-pay

## 2020-02-20 ENCOUNTER — Ambulatory Visit: Admission: RE | Admit: 2020-02-20 | Payer: Medicare HMO | Source: Ambulatory Visit

## 2020-03-04 ENCOUNTER — Other Ambulatory Visit: Payer: Self-pay

## 2020-03-04 ENCOUNTER — Ambulatory Visit
Admission: RE | Admit: 2020-03-04 | Discharge: 2020-03-04 | Disposition: A | Discharge status: Home / Self Care | Payer: Medicare HMO | Source: Ambulatory Visit | Attending: "Endocrinology | Admitting: "Endocrinology

## 2020-03-04 DIAGNOSIS — I6782 Cerebral ischemia: Secondary | ICD-10-CM | POA: Diagnosis not present

## 2020-03-04 DIAGNOSIS — D352 Benign neoplasm of pituitary gland: Secondary | ICD-10-CM | POA: Diagnosis not present

## 2020-03-04 MED ORDER — GADOBUTROL 1 MMOL/ML IV SOLN
6.0000 mL | Freq: Once | INTRAVENOUS | Status: AC | PRN
  Administered 2020-03-04: 6 mL via INTRAVENOUS

## 2020-03-19 ENCOUNTER — Other Ambulatory Visit: Payer: Self-pay | Admitting: Internal Medicine

## 2020-03-22 DIAGNOSIS — M810 Age-related osteoporosis without current pathological fracture: Secondary | ICD-10-CM | POA: Diagnosis not present

## 2020-04-01 ENCOUNTER — Other Ambulatory Visit: Payer: Self-pay | Admitting: Internal Medicine

## 2020-04-04 ENCOUNTER — Ambulatory Visit: Payer: Medicare HMO | Admitting: Internal Medicine

## 2020-04-29 DIAGNOSIS — G608 Other hereditary and idiopathic neuropathies: Secondary | ICD-10-CM | POA: Diagnosis not present

## 2020-04-29 DIAGNOSIS — M542 Cervicalgia: Secondary | ICD-10-CM | POA: Diagnosis not present

## 2020-05-13 DIAGNOSIS — R202 Paresthesia of skin: Secondary | ICD-10-CM | POA: Diagnosis not present

## 2020-05-13 DIAGNOSIS — E782 Mixed hyperlipidemia: Secondary | ICD-10-CM | POA: Diagnosis not present

## 2020-05-13 DIAGNOSIS — Z780 Asymptomatic menopausal state: Secondary | ICD-10-CM | POA: Diagnosis not present

## 2020-05-13 DIAGNOSIS — I10 Essential (primary) hypertension: Secondary | ICD-10-CM | POA: Diagnosis not present

## 2020-05-13 DIAGNOSIS — M810 Age-related osteoporosis without current pathological fracture: Secondary | ICD-10-CM | POA: Diagnosis not present

## 2020-05-13 DIAGNOSIS — D352 Benign neoplasm of pituitary gland: Secondary | ICD-10-CM | POA: Diagnosis not present

## 2020-05-20 DIAGNOSIS — D352 Benign neoplasm of pituitary gland: Secondary | ICD-10-CM | POA: Diagnosis not present

## 2020-05-20 DIAGNOSIS — Z9181 History of falling: Secondary | ICD-10-CM | POA: Diagnosis not present

## 2020-05-20 DIAGNOSIS — E782 Mixed hyperlipidemia: Secondary | ICD-10-CM | POA: Diagnosis not present

## 2020-05-20 DIAGNOSIS — I10 Essential (primary) hypertension: Secondary | ICD-10-CM | POA: Diagnosis not present

## 2020-05-28 ENCOUNTER — Other Ambulatory Visit: Payer: Self-pay | Admitting: Internal Medicine

## 2020-06-07 ENCOUNTER — Other Ambulatory Visit: Payer: Self-pay | Admitting: Internal Medicine

## 2020-06-26 DIAGNOSIS — F411 Generalized anxiety disorder: Secondary | ICD-10-CM | POA: Diagnosis not present

## 2020-06-26 DIAGNOSIS — D352 Benign neoplasm of pituitary gland: Secondary | ICD-10-CM | POA: Diagnosis not present

## 2020-06-26 DIAGNOSIS — J449 Chronic obstructive pulmonary disease, unspecified: Secondary | ICD-10-CM | POA: Diagnosis not present

## 2020-06-26 DIAGNOSIS — F331 Major depressive disorder, recurrent, moderate: Secondary | ICD-10-CM | POA: Diagnosis not present

## 2020-06-26 DIAGNOSIS — I10 Essential (primary) hypertension: Secondary | ICD-10-CM | POA: Diagnosis not present

## 2020-08-12 ENCOUNTER — Other Ambulatory Visit: Payer: Self-pay | Admitting: Internal Medicine

## 2020-08-21 DIAGNOSIS — M19072 Primary osteoarthritis, left ankle and foot: Secondary | ICD-10-CM | POA: Diagnosis not present

## 2020-08-21 DIAGNOSIS — M79675 Pain in left toe(s): Secondary | ICD-10-CM | POA: Diagnosis not present

## 2020-08-21 DIAGNOSIS — B351 Tinea unguium: Secondary | ICD-10-CM | POA: Diagnosis not present

## 2020-08-21 DIAGNOSIS — M79674 Pain in right toe(s): Secondary | ICD-10-CM | POA: Diagnosis not present

## 2020-08-21 DIAGNOSIS — L600 Ingrowing nail: Secondary | ICD-10-CM | POA: Diagnosis not present

## 2020-08-21 DIAGNOSIS — M76829 Posterior tibial tendinitis, unspecified leg: Secondary | ICD-10-CM | POA: Diagnosis not present

## 2020-08-28 ENCOUNTER — Other Ambulatory Visit: Payer: Self-pay | Admitting: Internal Medicine

## 2020-09-26 DIAGNOSIS — Z85828 Personal history of other malignant neoplasm of skin: Secondary | ICD-10-CM | POA: Diagnosis not present

## 2020-09-26 DIAGNOSIS — D2261 Melanocytic nevi of right upper limb, including shoulder: Secondary | ICD-10-CM | POA: Diagnosis not present

## 2020-09-26 DIAGNOSIS — D2272 Melanocytic nevi of left lower limb, including hip: Secondary | ICD-10-CM | POA: Diagnosis not present

## 2020-09-26 DIAGNOSIS — D225 Melanocytic nevi of trunk: Secondary | ICD-10-CM | POA: Diagnosis not present

## 2020-09-26 DIAGNOSIS — C44619 Basal cell carcinoma of skin of left upper limb, including shoulder: Secondary | ICD-10-CM | POA: Diagnosis not present

## 2020-09-26 DIAGNOSIS — L821 Other seborrheic keratosis: Secondary | ICD-10-CM | POA: Diagnosis not present

## 2020-09-26 DIAGNOSIS — D485 Neoplasm of uncertain behavior of skin: Secondary | ICD-10-CM | POA: Diagnosis not present

## 2020-10-31 DIAGNOSIS — D352 Benign neoplasm of pituitary gland: Secondary | ICD-10-CM | POA: Diagnosis not present

## 2020-11-07 DIAGNOSIS — D352 Benign neoplasm of pituitary gland: Secondary | ICD-10-CM | POA: Diagnosis not present

## 2020-11-27 DIAGNOSIS — B9789 Other viral agents as the cause of diseases classified elsewhere: Secondary | ICD-10-CM | POA: Diagnosis not present

## 2020-11-27 DIAGNOSIS — J028 Acute pharyngitis due to other specified organisms: Secondary | ICD-10-CM | POA: Diagnosis not present

## 2020-11-27 DIAGNOSIS — Z03818 Encounter for observation for suspected exposure to other biological agents ruled out: Secondary | ICD-10-CM | POA: Diagnosis not present

## 2020-12-07 ENCOUNTER — Other Ambulatory Visit: Payer: Self-pay | Admitting: Internal Medicine

## 2020-12-09 ENCOUNTER — Other Ambulatory Visit: Payer: Self-pay | Admitting: Internal Medicine

## 2021-01-01 ENCOUNTER — Other Ambulatory Visit: Payer: Self-pay | Admitting: Internal Medicine

## 2021-01-21 ENCOUNTER — Other Ambulatory Visit: Payer: Self-pay | Admitting: Internal Medicine

## 2021-01-21 DIAGNOSIS — Z1231 Encounter for screening mammogram for malignant neoplasm of breast: Secondary | ICD-10-CM

## 2021-01-24 DIAGNOSIS — R42 Dizziness and giddiness: Secondary | ICD-10-CM | POA: Diagnosis not present

## 2021-01-24 DIAGNOSIS — M542 Cervicalgia: Secondary | ICD-10-CM | POA: Diagnosis not present

## 2021-01-24 DIAGNOSIS — D352 Benign neoplasm of pituitary gland: Secondary | ICD-10-CM | POA: Diagnosis not present

## 2021-01-24 DIAGNOSIS — G608 Other hereditary and idiopathic neuropathies: Secondary | ICD-10-CM | POA: Diagnosis not present

## 2021-01-28 DIAGNOSIS — D352 Benign neoplasm of pituitary gland: Secondary | ICD-10-CM | POA: Diagnosis not present

## 2021-01-28 DIAGNOSIS — G608 Other hereditary and idiopathic neuropathies: Secondary | ICD-10-CM | POA: Diagnosis not present

## 2021-01-28 DIAGNOSIS — R928 Other abnormal and inconclusive findings on diagnostic imaging of breast: Secondary | ICD-10-CM | POA: Diagnosis not present

## 2021-01-28 DIAGNOSIS — I10 Essential (primary) hypertension: Secondary | ICD-10-CM | POA: Diagnosis not present

## 2021-01-28 DIAGNOSIS — Z780 Asymptomatic menopausal state: Secondary | ICD-10-CM | POA: Diagnosis not present

## 2021-03-26 ENCOUNTER — Other Ambulatory Visit: Payer: Self-pay | Admitting: Internal Medicine

## 2021-03-26 NOTE — Telephone Encounter (Signed)
She is now seeing Dr Tressia Miners.  Will need to get refilled through her office.

## 2021-03-31 DIAGNOSIS — R739 Hyperglycemia, unspecified: Secondary | ICD-10-CM | POA: Diagnosis not present

## 2021-03-31 DIAGNOSIS — I10 Essential (primary) hypertension: Secondary | ICD-10-CM | POA: Diagnosis not present

## 2021-03-31 DIAGNOSIS — Z780 Asymptomatic menopausal state: Secondary | ICD-10-CM | POA: Diagnosis not present

## 2021-03-31 DIAGNOSIS — G629 Polyneuropathy, unspecified: Secondary | ICD-10-CM | POA: Diagnosis not present

## 2021-04-03 DIAGNOSIS — I10 Essential (primary) hypertension: Secondary | ICD-10-CM | POA: Diagnosis not present

## 2021-04-03 DIAGNOSIS — G629 Polyneuropathy, unspecified: Secondary | ICD-10-CM | POA: Diagnosis not present

## 2021-04-03 DIAGNOSIS — F331 Major depressive disorder, recurrent, moderate: Secondary | ICD-10-CM | POA: Diagnosis not present

## 2021-04-03 DIAGNOSIS — I83813 Varicose veins of bilateral lower extremities with pain: Secondary | ICD-10-CM | POA: Diagnosis not present

## 2021-04-03 DIAGNOSIS — Z87891 Personal history of nicotine dependence: Secondary | ICD-10-CM | POA: Diagnosis not present

## 2021-04-03 DIAGNOSIS — J449 Chronic obstructive pulmonary disease, unspecified: Secondary | ICD-10-CM | POA: Diagnosis not present

## 2021-04-03 DIAGNOSIS — Z0000 Encounter for general adult medical examination without abnormal findings: Secondary | ICD-10-CM | POA: Diagnosis not present

## 2021-05-02 ENCOUNTER — Other Ambulatory Visit: Payer: Self-pay

## 2021-05-02 ENCOUNTER — Ambulatory Visit
Admission: RE | Admit: 2021-05-02 | Discharge: 2021-05-02 | Disposition: A | Discharge status: Home / Self Care | Payer: Medicare HMO | Source: Ambulatory Visit | Attending: Internal Medicine | Admitting: Internal Medicine

## 2021-05-02 DIAGNOSIS — Z1231 Encounter for screening mammogram for malignant neoplasm of breast: Secondary | ICD-10-CM | POA: Insufficient documentation

## 2021-05-12 DIAGNOSIS — H524 Presbyopia: Secondary | ICD-10-CM | POA: Diagnosis not present

## 2021-05-12 DIAGNOSIS — H43813 Vitreous degeneration, bilateral: Secondary | ICD-10-CM | POA: Diagnosis not present

## 2021-05-13 ENCOUNTER — Other Ambulatory Visit: Payer: Self-pay | Admitting: Internal Medicine

## 2021-05-28 ENCOUNTER — Other Ambulatory Visit: Payer: Self-pay | Admitting: Internal Medicine

## 2021-06-04 ENCOUNTER — Other Ambulatory Visit: Payer: Self-pay | Admitting: Internal Medicine

## 2021-06-20 DIAGNOSIS — F424 Excoriation (skin-picking) disorder: Secondary | ICD-10-CM | POA: Diagnosis not present

## 2021-06-20 DIAGNOSIS — C44619 Basal cell carcinoma of skin of left upper limb, including shoulder: Secondary | ICD-10-CM | POA: Diagnosis not present

## 2021-06-20 DIAGNOSIS — D225 Melanocytic nevi of trunk: Secondary | ICD-10-CM | POA: Diagnosis not present

## 2021-06-20 DIAGNOSIS — D2261 Melanocytic nevi of right upper limb, including shoulder: Secondary | ICD-10-CM | POA: Diagnosis not present

## 2021-06-20 DIAGNOSIS — D2262 Melanocytic nevi of left upper limb, including shoulder: Secondary | ICD-10-CM | POA: Diagnosis not present

## 2021-06-20 DIAGNOSIS — D2271 Melanocytic nevi of right lower limb, including hip: Secondary | ICD-10-CM | POA: Diagnosis not present

## 2021-07-04 DIAGNOSIS — S99921A Unspecified injury of right foot, initial encounter: Secondary | ICD-10-CM | POA: Diagnosis not present

## 2021-07-04 DIAGNOSIS — S99912A Unspecified injury of left ankle, initial encounter: Secondary | ICD-10-CM | POA: Diagnosis not present

## 2021-07-04 DIAGNOSIS — S99922A Unspecified injury of left foot, initial encounter: Secondary | ICD-10-CM | POA: Diagnosis not present

## 2021-07-04 DIAGNOSIS — W010XXA Fall on same level from slipping, tripping and stumbling without subsequent striking against object, initial encounter: Secondary | ICD-10-CM | POA: Diagnosis not present

## 2021-07-04 DIAGNOSIS — M7989 Other specified soft tissue disorders: Secondary | ICD-10-CM | POA: Diagnosis not present

## 2021-07-24 DIAGNOSIS — S92352D Displaced fracture of fifth metatarsal bone, left foot, subsequent encounter for fracture with routine healing: Secondary | ICD-10-CM | POA: Diagnosis not present

## 2021-07-24 DIAGNOSIS — M79674 Pain in right toe(s): Secondary | ICD-10-CM | POA: Diagnosis not present

## 2021-07-24 DIAGNOSIS — S92322D Displaced fracture of second metatarsal bone, left foot, subsequent encounter for fracture with routine healing: Secondary | ICD-10-CM | POA: Diagnosis not present

## 2021-07-24 DIAGNOSIS — S92342D Displaced fracture of fourth metatarsal bone, left foot, subsequent encounter for fracture with routine healing: Secondary | ICD-10-CM | POA: Diagnosis not present

## 2021-07-24 DIAGNOSIS — S92332D Displaced fracture of third metatarsal bone, left foot, subsequent encounter for fracture with routine healing: Secondary | ICD-10-CM | POA: Diagnosis not present

## 2021-07-24 DIAGNOSIS — M79675 Pain in left toe(s): Secondary | ICD-10-CM | POA: Diagnosis not present

## 2021-07-24 DIAGNOSIS — B351 Tinea unguium: Secondary | ICD-10-CM | POA: Diagnosis not present

## 2021-07-25 DIAGNOSIS — M542 Cervicalgia: Secondary | ICD-10-CM | POA: Diagnosis not present

## 2021-07-25 DIAGNOSIS — R42 Dizziness and giddiness: Secondary | ICD-10-CM | POA: Diagnosis not present

## 2021-07-25 DIAGNOSIS — M5416 Radiculopathy, lumbar region: Secondary | ICD-10-CM | POA: Diagnosis not present

## 2021-07-25 DIAGNOSIS — G608 Other hereditary and idiopathic neuropathies: Secondary | ICD-10-CM | POA: Diagnosis not present

## 2021-07-29 ENCOUNTER — Other Ambulatory Visit: Payer: Self-pay | Admitting: Student

## 2021-07-29 DIAGNOSIS — R42 Dizziness and giddiness: Secondary | ICD-10-CM

## 2021-08-07 ENCOUNTER — Ambulatory Visit: Payer: Medicare HMO

## 2021-08-20 ENCOUNTER — Ambulatory Visit: Payer: Medicare HMO | Attending: Student

## 2021-10-01 DIAGNOSIS — I10 Essential (primary) hypertension: Secondary | ICD-10-CM | POA: Diagnosis not present

## 2021-10-01 DIAGNOSIS — M1811 Unilateral primary osteoarthritis of first carpometacarpal joint, right hand: Secondary | ICD-10-CM | POA: Diagnosis not present

## 2021-10-01 DIAGNOSIS — M79642 Pain in left hand: Secondary | ICD-10-CM | POA: Diagnosis not present

## 2021-10-01 DIAGNOSIS — M1812 Unilateral primary osteoarthritis of first carpometacarpal joint, left hand: Secondary | ICD-10-CM | POA: Diagnosis not present

## 2021-10-01 DIAGNOSIS — Z780 Asymptomatic menopausal state: Secondary | ICD-10-CM | POA: Diagnosis not present

## 2021-10-01 DIAGNOSIS — D352 Benign neoplasm of pituitary gland: Secondary | ICD-10-CM | POA: Diagnosis not present

## 2021-10-01 DIAGNOSIS — M79641 Pain in right hand: Secondary | ICD-10-CM | POA: Diagnosis not present

## 2021-10-23 DIAGNOSIS — R42 Dizziness and giddiness: Secondary | ICD-10-CM | POA: Diagnosis not present

## 2021-10-28 DIAGNOSIS — E876 Hypokalemia: Secondary | ICD-10-CM | POA: Diagnosis not present

## 2021-10-28 DIAGNOSIS — Z23 Encounter for immunization: Secondary | ICD-10-CM | POA: Diagnosis not present

## 2021-10-28 DIAGNOSIS — E7800 Pure hypercholesterolemia, unspecified: Secondary | ICD-10-CM | POA: Diagnosis not present

## 2021-10-28 DIAGNOSIS — I10 Essential (primary) hypertension: Secondary | ICD-10-CM | POA: Diagnosis not present

## 2021-10-28 DIAGNOSIS — R011 Cardiac murmur, unspecified: Secondary | ICD-10-CM | POA: Diagnosis not present

## 2021-11-11 DIAGNOSIS — D352 Benign neoplasm of pituitary gland: Secondary | ICD-10-CM | POA: Diagnosis not present

## 2021-11-13 DIAGNOSIS — R011 Cardiac murmur, unspecified: Secondary | ICD-10-CM | POA: Diagnosis not present

## 2021-11-25 DIAGNOSIS — M5416 Radiculopathy, lumbar region: Secondary | ICD-10-CM | POA: Diagnosis not present

## 2021-11-25 DIAGNOSIS — R42 Dizziness and giddiness: Secondary | ICD-10-CM | POA: Diagnosis not present

## 2021-11-25 DIAGNOSIS — G608 Other hereditary and idiopathic neuropathies: Secondary | ICD-10-CM | POA: Diagnosis not present

## 2021-11-25 DIAGNOSIS — M542 Cervicalgia: Secondary | ICD-10-CM | POA: Diagnosis not present

## 2021-11-26 ENCOUNTER — Other Ambulatory Visit: Payer: Self-pay | Admitting: Student

## 2021-11-26 DIAGNOSIS — R42 Dizziness and giddiness: Secondary | ICD-10-CM

## 2021-12-02 ENCOUNTER — Ambulatory Visit
Admission: RE | Admit: 2021-12-02 | Discharge: 2021-12-02 | Disposition: A | Discharge status: Home / Self Care | Payer: Medicare HMO | Source: Ambulatory Visit | Attending: Student | Admitting: Student

## 2021-12-02 DIAGNOSIS — I6523 Occlusion and stenosis of bilateral carotid arteries: Secondary | ICD-10-CM | POA: Diagnosis not present

## 2021-12-02 DIAGNOSIS — I10 Essential (primary) hypertension: Secondary | ICD-10-CM | POA: Diagnosis not present

## 2021-12-02 DIAGNOSIS — R42 Dizziness and giddiness: Secondary | ICD-10-CM | POA: Insufficient documentation

## 2021-12-02 DIAGNOSIS — E782 Mixed hyperlipidemia: Secondary | ICD-10-CM | POA: Diagnosis not present

## 2021-12-06 DIAGNOSIS — I10 Essential (primary) hypertension: Secondary | ICD-10-CM | POA: Diagnosis not present

## 2021-12-06 DIAGNOSIS — J449 Chronic obstructive pulmonary disease, unspecified: Secondary | ICD-10-CM | POA: Diagnosis not present

## 2021-12-06 DIAGNOSIS — F5104 Psychophysiologic insomnia: Secondary | ICD-10-CM | POA: Diagnosis not present

## 2021-12-06 DIAGNOSIS — D509 Iron deficiency anemia, unspecified: Secondary | ICD-10-CM | POA: Diagnosis not present

## 2021-12-06 DIAGNOSIS — G629 Polyneuropathy, unspecified: Secondary | ICD-10-CM | POA: Diagnosis not present

## 2021-12-06 DIAGNOSIS — K219 Gastro-esophageal reflux disease without esophagitis: Secondary | ICD-10-CM | POA: Diagnosis not present

## 2021-12-06 DIAGNOSIS — G5603 Carpal tunnel syndrome, bilateral upper limbs: Secondary | ICD-10-CM | POA: Diagnosis not present

## 2021-12-08 DIAGNOSIS — E892 Postprocedural hypoparathyroidism: Secondary | ICD-10-CM | POA: Diagnosis not present

## 2021-12-08 DIAGNOSIS — D352 Benign neoplasm of pituitary gland: Secondary | ICD-10-CM | POA: Diagnosis not present

## 2021-12-08 DIAGNOSIS — Z23 Encounter for immunization: Secondary | ICD-10-CM | POA: Diagnosis not present

## 2021-12-08 DIAGNOSIS — Z780 Asymptomatic menopausal state: Secondary | ICD-10-CM | POA: Diagnosis not present

## 2021-12-08 DIAGNOSIS — I10 Essential (primary) hypertension: Secondary | ICD-10-CM | POA: Diagnosis not present

## 2021-12-09 DIAGNOSIS — I10 Essential (primary) hypertension: Secondary | ICD-10-CM | POA: Diagnosis not present

## 2021-12-09 DIAGNOSIS — E7800 Pure hypercholesterolemia, unspecified: Secondary | ICD-10-CM | POA: Diagnosis not present

## 2021-12-09 DIAGNOSIS — I361 Nonrheumatic tricuspid (valve) insufficiency: Secondary | ICD-10-CM | POA: Diagnosis not present

## 2022-01-09 ENCOUNTER — Other Ambulatory Visit: Payer: Self-pay | Admitting: Internal Medicine

## 2022-01-12 ENCOUNTER — Telehealth: Payer: Self-pay

## 2022-01-12 NOTE — Telephone Encounter (Signed)
I called pts son in regards to the refill request on: : CHLORTHALIDONE 25 MG Tablet   It was filled 12-10-20 by Dr. Nicki Reaper but pts PCP is Dr. Tressia Miners.   Pts son informed me that the medication was in fact taken over by Dr. Tressia Miners.   Refil request has been fwd to pts PCP.

## 2022-04-06 ENCOUNTER — Other Ambulatory Visit: Payer: Self-pay | Admitting: Internal Medicine

## 2022-04-06 DIAGNOSIS — J449 Chronic obstructive pulmonary disease, unspecified: Secondary | ICD-10-CM | POA: Diagnosis not present

## 2022-04-06 DIAGNOSIS — M810 Age-related osteoporosis without current pathological fracture: Secondary | ICD-10-CM | POA: Diagnosis not present

## 2022-04-06 DIAGNOSIS — R739 Hyperglycemia, unspecified: Secondary | ICD-10-CM | POA: Diagnosis not present

## 2022-04-06 DIAGNOSIS — Z1231 Encounter for screening mammogram for malignant neoplasm of breast: Secondary | ICD-10-CM

## 2022-04-06 DIAGNOSIS — Z780 Asymptomatic menopausal state: Secondary | ICD-10-CM | POA: Diagnosis not present

## 2022-04-06 DIAGNOSIS — D352 Benign neoplasm of pituitary gland: Secondary | ICD-10-CM | POA: Diagnosis not present

## 2022-04-06 DIAGNOSIS — R42 Dizziness and giddiness: Secondary | ICD-10-CM | POA: Diagnosis not present

## 2022-04-06 DIAGNOSIS — I10 Essential (primary) hypertension: Secondary | ICD-10-CM | POA: Diagnosis not present

## 2022-04-06 DIAGNOSIS — K5904 Chronic idiopathic constipation: Secondary | ICD-10-CM | POA: Diagnosis not present

## 2022-04-06 DIAGNOSIS — Z0000 Encounter for general adult medical examination without abnormal findings: Secondary | ICD-10-CM | POA: Diagnosis not present

## 2022-05-04 ENCOUNTER — Ambulatory Visit
Admission: RE | Admit: 2022-05-04 | Discharge: 2022-05-04 | Disposition: A | Discharge status: Home / Self Care | Payer: Medicare HMO | Source: Ambulatory Visit | Attending: Internal Medicine | Admitting: Internal Medicine

## 2022-05-04 DIAGNOSIS — Z1231 Encounter for screening mammogram for malignant neoplasm of breast: Secondary | ICD-10-CM | POA: Insufficient documentation

## 2022-06-24 DIAGNOSIS — M3501 Sicca syndrome with keratoconjunctivitis: Secondary | ICD-10-CM | POA: Diagnosis not present

## 2022-06-24 DIAGNOSIS — H35379 Puckering of macula, unspecified eye: Secondary | ICD-10-CM | POA: Diagnosis not present

## 2022-06-24 DIAGNOSIS — H43813 Vitreous degeneration, bilateral: Secondary | ICD-10-CM | POA: Diagnosis not present

## 2022-06-24 DIAGNOSIS — H02403 Unspecified ptosis of bilateral eyelids: Secondary | ICD-10-CM | POA: Diagnosis not present

## 2022-06-24 DIAGNOSIS — Z0100 Encounter for examination of eyes and vision without abnormal findings: Secondary | ICD-10-CM | POA: Diagnosis not present

## 2022-07-22 DIAGNOSIS — E7800 Pure hypercholesterolemia, unspecified: Secondary | ICD-10-CM | POA: Diagnosis not present

## 2022-07-22 DIAGNOSIS — R002 Palpitations: Secondary | ICD-10-CM | POA: Diagnosis not present

## 2022-07-22 DIAGNOSIS — I10 Essential (primary) hypertension: Secondary | ICD-10-CM | POA: Diagnosis not present

## 2022-07-22 DIAGNOSIS — I361 Nonrheumatic tricuspid (valve) insufficiency: Secondary | ICD-10-CM | POA: Diagnosis not present

## 2022-08-06 DIAGNOSIS — I10 Essential (primary) hypertension: Secondary | ICD-10-CM | POA: Diagnosis not present

## 2022-08-06 DIAGNOSIS — G6289 Other specified polyneuropathies: Secondary | ICD-10-CM | POA: Diagnosis not present

## 2022-08-06 DIAGNOSIS — D352 Benign neoplasm of pituitary gland: Secondary | ICD-10-CM | POA: Diagnosis not present

## 2022-11-18 DIAGNOSIS — D352 Benign neoplasm of pituitary gland: Secondary | ICD-10-CM | POA: Diagnosis not present

## 2022-11-18 DIAGNOSIS — M810 Age-related osteoporosis without current pathological fracture: Secondary | ICD-10-CM | POA: Diagnosis not present

## 2022-12-28 DIAGNOSIS — G6289 Other specified polyneuropathies: Secondary | ICD-10-CM | POA: Diagnosis not present

## 2022-12-28 DIAGNOSIS — I10 Essential (primary) hypertension: Secondary | ICD-10-CM | POA: Diagnosis not present

## 2022-12-28 DIAGNOSIS — M216X2 Other acquired deformities of left foot: Secondary | ICD-10-CM | POA: Diagnosis not present

## 2022-12-28 DIAGNOSIS — D352 Benign neoplasm of pituitary gland: Secondary | ICD-10-CM | POA: Diagnosis not present

## 2022-12-31 DIAGNOSIS — H02831 Dermatochalasis of right upper eyelid: Secondary | ICD-10-CM | POA: Diagnosis not present

## 2023-02-19 DIAGNOSIS — M2042 Other hammer toe(s) (acquired), left foot: Secondary | ICD-10-CM | POA: Diagnosis not present

## 2023-02-19 DIAGNOSIS — B351 Tinea unguium: Secondary | ICD-10-CM | POA: Diagnosis not present

## 2023-02-19 DIAGNOSIS — L601 Onycholysis: Secondary | ICD-10-CM | POA: Diagnosis not present

## 2023-02-19 DIAGNOSIS — M2041 Other hammer toe(s) (acquired), right foot: Secondary | ICD-10-CM | POA: Diagnosis not present

## 2023-02-19 DIAGNOSIS — M898X9 Other specified disorders of bone, unspecified site: Secondary | ICD-10-CM | POA: Diagnosis not present

## 2023-02-19 DIAGNOSIS — L600 Ingrowing nail: Secondary | ICD-10-CM | POA: Diagnosis not present

## 2023-05-11 DIAGNOSIS — G6289 Other specified polyneuropathies: Secondary | ICD-10-CM | POA: Diagnosis not present

## 2023-05-11 DIAGNOSIS — I10 Essential (primary) hypertension: Secondary | ICD-10-CM | POA: Diagnosis not present

## 2023-05-11 DIAGNOSIS — R7303 Prediabetes: Secondary | ICD-10-CM | POA: Diagnosis not present

## 2023-06-15 ENCOUNTER — Other Ambulatory Visit: Payer: Self-pay | Admitting: Internal Medicine

## 2023-06-15 DIAGNOSIS — Z1231 Encounter for screening mammogram for malignant neoplasm of breast: Secondary | ICD-10-CM

## 2023-06-23 ENCOUNTER — Ambulatory Visit
Admission: RE | Admit: 2023-06-23 | Discharge: 2023-06-23 | Disposition: A | Discharge status: Home / Self Care | Source: Ambulatory Visit | Attending: Internal Medicine | Admitting: Internal Medicine

## 2023-06-23 DIAGNOSIS — Z1231 Encounter for screening mammogram for malignant neoplasm of breast: Secondary | ICD-10-CM | POA: Diagnosis not present

## 2023-06-30 DIAGNOSIS — H35379 Puckering of macula, unspecified eye: Secondary | ICD-10-CM | POA: Diagnosis not present

## 2023-06-30 DIAGNOSIS — Z0100 Encounter for examination of eyes and vision without abnormal findings: Secondary | ICD-10-CM | POA: Diagnosis not present

## 2023-06-30 DIAGNOSIS — Z9889 Other specified postprocedural states: Secondary | ICD-10-CM | POA: Diagnosis not present

## 2023-06-30 DIAGNOSIS — H43813 Vitreous degeneration, bilateral: Secondary | ICD-10-CM | POA: Diagnosis not present

## 2023-06-30 DIAGNOSIS — M3501 Sicca syndrome with keratoconjunctivitis: Secondary | ICD-10-CM | POA: Diagnosis not present

## 2023-07-22 DIAGNOSIS — I10 Essential (primary) hypertension: Secondary | ICD-10-CM | POA: Diagnosis not present

## 2023-07-22 DIAGNOSIS — E7800 Pure hypercholesterolemia, unspecified: Secondary | ICD-10-CM | POA: Diagnosis not present

## 2023-07-22 DIAGNOSIS — R0602 Shortness of breath: Secondary | ICD-10-CM | POA: Diagnosis not present

## 2023-07-22 DIAGNOSIS — R002 Palpitations: Secondary | ICD-10-CM | POA: Diagnosis not present

## 2023-07-22 DIAGNOSIS — R001 Bradycardia, unspecified: Secondary | ICD-10-CM | POA: Diagnosis not present

## 2023-10-23 DIAGNOSIS — Z0100 Encounter for examination of eyes and vision without abnormal findings: Secondary | ICD-10-CM | POA: Diagnosis not present

## 2023-10-23 DIAGNOSIS — H43813 Vitreous degeneration, bilateral: Secondary | ICD-10-CM | POA: Diagnosis not present

## 2023-10-23 DIAGNOSIS — H524 Presbyopia: Secondary | ICD-10-CM | POA: Diagnosis not present

## 2023-10-23 DIAGNOSIS — H26491 Other secondary cataract, right eye: Secondary | ICD-10-CM | POA: Diagnosis not present

## 2023-10-23 DIAGNOSIS — H35372 Puckering of macula, left eye: Secondary | ICD-10-CM | POA: Diagnosis not present

## 2023-11-24 DIAGNOSIS — D352 Benign neoplasm of pituitary gland: Secondary | ICD-10-CM | POA: Diagnosis not present

## 2023-11-26 DIAGNOSIS — E782 Mixed hyperlipidemia: Secondary | ICD-10-CM | POA: Diagnosis not present

## 2023-11-26 DIAGNOSIS — R7303 Prediabetes: Secondary | ICD-10-CM | POA: Diagnosis not present

## 2023-11-26 DIAGNOSIS — I10 Essential (primary) hypertension: Secondary | ICD-10-CM | POA: Diagnosis not present

## 2023-11-26 DIAGNOSIS — D352 Benign neoplasm of pituitary gland: Secondary | ICD-10-CM | POA: Diagnosis not present

## 2023-12-01 ENCOUNTER — Other Ambulatory Visit: Payer: Self-pay | Admitting: "Endocrinology

## 2023-12-01 DIAGNOSIS — D352 Benign neoplasm of pituitary gland: Secondary | ICD-10-CM

## 2023-12-03 DIAGNOSIS — Z0000 Encounter for general adult medical examination without abnormal findings: Secondary | ICD-10-CM | POA: Diagnosis not present

## 2023-12-03 DIAGNOSIS — Z1331 Encounter for screening for depression: Secondary | ICD-10-CM | POA: Diagnosis not present

## 2023-12-03 DIAGNOSIS — Z780 Asymptomatic menopausal state: Secondary | ICD-10-CM | POA: Diagnosis not present

## 2023-12-03 DIAGNOSIS — G629 Polyneuropathy, unspecified: Secondary | ICD-10-CM | POA: Diagnosis not present

## 2023-12-03 DIAGNOSIS — Z23 Encounter for immunization: Secondary | ICD-10-CM | POA: Diagnosis not present

## 2023-12-04 ENCOUNTER — Ambulatory Visit
Admission: RE | Admit: 2023-12-04 | Discharge: 2023-12-04 | Disposition: A | Source: Ambulatory Visit | Attending: "Endocrinology | Admitting: "Endocrinology

## 2023-12-04 DIAGNOSIS — R9082 White matter disease, unspecified: Secondary | ICD-10-CM | POA: Diagnosis not present

## 2023-12-04 DIAGNOSIS — D352 Benign neoplasm of pituitary gland: Secondary | ICD-10-CM | POA: Insufficient documentation

## 2023-12-04 DIAGNOSIS — Z86018 Personal history of other benign neoplasm: Secondary | ICD-10-CM | POA: Diagnosis not present

## 2023-12-04 DIAGNOSIS — G930 Cerebral cysts: Secondary | ICD-10-CM | POA: Diagnosis not present

## 2023-12-04 DIAGNOSIS — R9089 Other abnormal findings on diagnostic imaging of central nervous system: Secondary | ICD-10-CM | POA: Diagnosis not present

## 2023-12-04 MED ORDER — GADOBUTROL 1 MMOL/ML IV SOLN
6.0000 mL | Freq: Once | INTRAVENOUS | Status: AC | PRN
Start: 2023-12-04 — End: 2023-12-04
  Administered 2023-12-04: 6 mL via INTRAVENOUS
